# Patient Record
Sex: Male | Born: 1950 | Race: White | Hispanic: No | Marital: Married | Smoking: Never smoker
Health system: Southern US, Community
[De-identification: ages and names within clinical notes are randomized; demographics above are authoritative.]

## PROBLEM LIST (undated history)

## (undated) DIAGNOSIS — E119 Type 2 diabetes mellitus without complications: Secondary | ICD-10-CM

## (undated) DIAGNOSIS — Z1501 Genetic susceptibility to malignant neoplasm of breast: Secondary | ICD-10-CM

## (undated) DIAGNOSIS — Z1589 Genetic susceptibility to other disease: Secondary | ICD-10-CM

## (undated) DIAGNOSIS — Z801 Family history of malignant neoplasm of trachea, bronchus and lung: Secondary | ICD-10-CM

## (undated) DIAGNOSIS — H5789 Other specified disorders of eye and adnexa: Secondary | ICD-10-CM

## (undated) DIAGNOSIS — I10 Essential (primary) hypertension: Secondary | ICD-10-CM

## (undated) HISTORY — DX: Genetic susceptibility to malignant neoplasm of breast: Z15.01

## (undated) HISTORY — DX: Genetic susceptibility to other disease: Z15.89

## (undated) HISTORY — DX: Family history of malignant neoplasm of trachea, bronchus and lung: Z80.1

---

## 1975-08-08 HISTORY — PX: OTHER SURGICAL HISTORY: SHX169

## 2013-11-29 ENCOUNTER — Emergency Department (INDEPENDENT_AMBULATORY_CARE_PROVIDER_SITE_OTHER)
Admission: EM | Admit: 2013-11-29 | Discharge: 2013-11-29 | Disposition: A | Discharge status: Home / Self Care | Payer: BC Managed Care – PPO | Source: Home / Self Care | Attending: Family Medicine | Admitting: Family Medicine

## 2013-11-29 ENCOUNTER — Encounter (HOSPITAL_COMMUNITY): Payer: Self-pay | Admitting: Emergency Medicine

## 2013-11-29 DIAGNOSIS — W230XXA Caught, crushed, jammed, or pinched between moving objects, initial encounter: Secondary | ICD-10-CM

## 2013-11-29 DIAGNOSIS — S61209A Unspecified open wound of unspecified finger without damage to nail, initial encounter: Secondary | ICD-10-CM

## 2013-11-29 DIAGNOSIS — S61012A Laceration without foreign body of left thumb without damage to nail, initial encounter: Secondary | ICD-10-CM

## 2013-11-29 MED ORDER — TRAMADOL HCL 50 MG PO TABS: 50.0000 mg | ORAL_TABLET | Freq: Four times a day (QID) | ORAL | Status: DC | PRN

## 2013-11-29 MED ORDER — CEPHALEXIN 500 MG PO CAPS: 500.0000 mg | ORAL_CAPSULE | Freq: Four times a day (QID) | ORAL | Status: DC

## 2013-11-29 NOTE — ED Provider Notes (Signed)
Vincent [name] is a 63 y.o. male who presents to Urgent Care today for left thumb laceration. Patient was working on his car today about one hour prior to presentation when his left thumb became entrapped between 2 heavy objects. He told her some free. This caused a deep avulsion type laceration to the volar aspect of his thumb from the MCP to the PIP. He notes. Intact sensation of the tip of his thumb along with normal motion. He rates his pain is mild. He notes his last tetanus shot was one year ago. He denies any fevers or chills nausea vomiting or diarrhea.   History reviewed. No pertinent past medical history. History  Substance Use Topics  . Smoking status: Not on file  . Smokeless tobacco: Not on file  . Alcohol Use: Not on file   ROS as above Medications: No current facility-administered medications for this encounter.   Current Outpatient Prescriptions  Medication Sig Dispense Refill  . cephALEXin (KEFLEX) 500 MG capsule Take 1 capsule (500 mg total) by mouth 4 (four) times daily.  40 capsule  0  . traMADol (ULTRAM) 50 MG tablet Take 1 tablet (50 mg total) by mouth every 6 (six) hours as needed.  15 tablet  0    Exam:  BP 171/104  Pulse 65  Temp(Src) 97.9 F (36.6 C) (Oral)  Resp 18  SpO2 98% Gen: Well NAD Left hand: Deep of bulging type laceration from the MCP to the DIP along the volar aspect of the thumb. The avulsion extends from the web space to the radial side of the thumb at the mid distal phalanx. The laceration does not extend along the ulnar side of the thumb.  The laceration extends deep through the dermis and deep tissues. No tendinous structures are seen to be involved. Total length of the laceration was around 4 cm He has intact flexion extension abduction, adduction and opposition.  Sensation and capillary refill are intact distally.  Laceration repair: Consent obtained and timeout performed.  Thumb soaked in diluted Betadine solution for 20  minutes. 5 mL of lidocaine without epinephrine were injected into the laceration achieving good anesthesia. 1.5 L of normal saline was used to copiously irrigate the wound. The flayed skin edges were trimmed.  11 simple interrupted and horizontal mattress sutures were used to close the wound. 4-0 Ethilon was used.  Antibiotic ointment and a sterile bulky dressing was applied. A dorsal thumb spica splint using Orthoglass premade spinting material was placed.  Patient tolerated the procedure well   Assessment and Plan: 63 y.o. male with deep laceration of the left volar thumb. The tendon structure does not appear to be involved.  I discussed the case with on-call hand surgeon Dr. Lenon Curt.  He agreed with repair in the office and followup Monday. Tetanus is up-to-date.  Prophylactic Keflex antibiotic.  Tramadol for pain control.   Discussed warning signs or symptoms. Please see discharge instructions. Patient expresses understanding.    Gregor Hams, MD 11/29/13 2024

## 2013-11-29 NOTE — ED Notes (Signed)
C/o left hand laceration due to trying to work on a car States left hand between thumb and index finger was caught

## 2013-11-29 NOTE — Discharge Instructions (Signed)
Thank you for coming in today. Follow up with Dr. Lenon Curt Monday.  Return as needed.  Laceration Care, Adult A laceration is a cut or lesion that goes through all layers of the skin and into the tissue just beneath the skin. TREATMENT  Some lacerations may not require closure. Some lacerations may not be able to be closed due to an increased risk of infection. It is important to see your caregiver as soon as possible after an injury to minimize the risk of infection and maximize the opportunity for successful closure. If closure is appropriate, pain medicines may be given, if needed. The wound will be cleaned to help prevent infection. Your caregiver will use stitches (sutures), staples, wound glue (adhesive), or skin adhesive strips to repair the laceration. These tools bring the skin edges together to allow for faster healing and a better cosmetic outcome. However, all wounds will heal with a scar. Once the wound has healed, scarring can be minimized by covering the wound with sunscreen during the day for 1 full year. HOME CARE INSTRUCTIONS  For sutures or staples:  Keep the wound clean and dry.  If you were given a bandage (dressing), you should change it at least once a day. Also, change the dressing if it becomes wet or dirty, or as directed by your caregiver.  Wash the wound with soap and water 2 times a day. Rinse the wound off with water to remove all soap. Pat the wound dry with a clean towel.  After cleaning, apply a thin layer of the antibiotic ointment as recommended by your caregiver. This will help prevent infection and keep the dressing from sticking.  You may shower as usual after the first 24 hours. Do not soak the wound in water until the sutures are removed.  Only take over-the-counter or prescription medicines for pain, discomfort, or fever as directed by your caregiver.  Get your sutures or staples removed as directed by your caregiver. For skin adhesive strips:  Keep the  wound clean and dry.  Do not get the skin adhesive strips wet. You may bathe carefully, using caution to keep the wound dry.  If the wound gets wet, pat it dry with a clean towel.  Skin adhesive strips will fall off on their own. You may trim the strips as the wound heals. Do not remove skin adhesive strips that are still stuck to the wound. They will fall off in time. For wound adhesive:  You may briefly wet your wound in the shower or bath. Do not soak or scrub the wound. Do not swim. Avoid periods of heavy perspiration until the skin adhesive has fallen off on its own. After showering or bathing, gently pat the wound dry with a clean towel.  Do not apply liquid medicine, cream medicine, or ointment medicine to your wound while the skin adhesive is in place. This may loosen the film before your wound is healed.  If a dressing is placed over the wound, be careful not to apply tape directly over the skin adhesive. This may cause the adhesive to be pulled off before the wound is healed.  Avoid prolonged exposure to sunlight or tanning lamps while the skin adhesive is in place. Exposure to ultraviolet light in the first year will darken the scar.  The skin adhesive will usually remain in place for 5 to 10 days, then naturally fall off the skin. Do not pick at the adhesive film. You may need a tetanus shot if:  You cannot remember when you had your last tetanus shot.  You have never had a tetanus shot. If you get a tetanus shot, your arm may swell, get red, and feel warm to the touch. This is common and not a problem. If you need a tetanus shot and you choose not to have one, there is a rare chance of getting tetanus. Sickness from tetanus can be serious. SEEK MEDICAL CARE IF:   You have redness, swelling, or increasing pain in the wound.  You see a red line that goes away from the wound.  You have yellowish-white fluid (pus) coming from the wound.  You have a fever.  You notice a bad  smell coming from the wound or dressing.  Your wound breaks open before or after sutures have been removed.  You notice something coming out of the wound such as wood or glass.  Your wound is on your hand or foot and you cannot move a finger or toe. SEEK IMMEDIATE MEDICAL CARE IF:   Your pain is not controlled with prescribed medicine.  You have severe swelling around the wound causing pain and numbness or a change in color in your arm, hand, leg, or foot.  Your wound splits open and starts bleeding.  You have worsening numbness, weakness, or loss of function of any joint around or beyond the wound.  You develop painful lumps near the wound or on the skin anywhere on your body. MAKE SURE YOU:   Understand these instructions.  Will watch your condition.  Will get help right away if you are not doing well or get worse. Document Released: 07/24/2005 Document Revised: 10/16/2011 Document Reviewed: 01/17/2011 [facility] Patient Information 2014 [place], [place].  Deep Skin Avulsion A deep skin avulsion is when all layers of the skin or parts of body structures have been torn away. This is usually a result of severe injury (trauma). A deep skin avulsion can include damage to important structures beneath the skin such as tendons, ligaments, nerves, or blood vessels.  CAUSES  Many injuries can lead to a deep skin avulsion. These include:   Crush injuries.  Bites.  Falls against jagged surfaces.  Gunshot wounds.  Severe burns and injuries involving dragging (such as those from a bicycle or motorcycle accident). TREATMENT   If the wound is small and there is no damage to vital structures like nerves and blood vessels, the damaged tissues may be removed. Then, the wound can be cleaned thoroughly and closed.  A skin graft may be performed. This is a procedure in which the outer layer of skin is removed from a different part of your body. That skin (skin graft) is used to cover the open  wound. This can happen after damaged tissue is removed and repairs are completed.  Your caregiver may onlyapply a bandage (dressing) to the wound. The wound will be kept clean and allowed to heal. Healing can take weeks or months and usually leaves a large scar. This type of treatment is only done if your caregiver feels that skin grafting or a similar procedure would not work. You might need a tetanus shot if:  You cannot remember when you had your last tetanus shot.  You have never had a tetanus shot.  The injury broke your skin. If you got a tetanus shot, your arm may swell, get red, and feel warm to the touch. This is common and not a problem. If you need a tetanus shot and you choose not to  have one, there is a rare chance of getting tetanus. Sickness from tetanus can be serious. HOME CARE INSTRUCTIONS   Only take over-the-counter or prescription medicines for pain, discomfort, or fever as directed by your caregiver.  Gently wash the area with mild soap and water 2 times a day, or as directed. Rinse off the soap. Pat the area dry with a clean towel. Do not rub the wound. This may cause bleeding.  Follow your caregiver's instructions for how often you need to change the dressing.  Apply ointment and a dressing to the wound as directed.  If the dressing sticks, moisten it with soapy water and gently remove it.  Change the bandage right away if it becomes wet, dirty, or starts to smell bad.  Take showers. Do not take tub baths, swim, or do anything that may soak the wound until it is healed.  Use anti-itch medicine as directed by your caregiver. The wound may itch when it is healing. Do not pick or scratch at the wound.  Follow up with your caregiver for stitches (sutures), staple, or skin adhesive strip removal. SEEK MEDICAL CARE IF:   You have redness, swelling, or increasing pain in your wound.  A red streak or line extends away from the wound.  You have pus coming from the  wound.  You notice a bad smell coming from thewound or dressing.  The wound breaks open (edges not staying together) after sutures have been removed.  You notice something coming out of the wound, such as a small piece of wood, glass, or metal.  You are unable to properly move a finger or toe if the wound is on your hand or foot.  You have severe swelling around the wound that causes pain and numbness.  Your arm, hand, leg, or foot changes color. SEEK IMMEDIATE MEDICAL CARE IF:   Your pain becomes severe or is not adequately relieved with pain medicine.  You have a fever.  You have nausea and vomiting for more than 24 hours.  You feel lightheaded, weak, or faint.  You develop chest pain or difficulty breathing. MAKE SURE YOU:   Understand these instructions.  Will watch your condition.  Will get help right away if you are not doing well or get worse. Document Released: 09/19/2006 Document Revised: 10/16/2011 Document Reviewed: 11/27/2010 [facility] Patient Information 2014 [place], [place].

## 2017-02-26 ENCOUNTER — Other Ambulatory Visit: Payer: Self-pay | Admitting: Family Medicine

## 2017-02-26 ENCOUNTER — Ambulatory Visit
Admission: RE | Admit: 2017-02-26 | Discharge: 2017-02-26 | Disposition: A | Discharge status: Home / Self Care | Payer: BLUE CROSS/BLUE SHIELD | Source: Ambulatory Visit | Attending: Family Medicine | Admitting: Family Medicine

## 2017-02-26 DIAGNOSIS — M25362 Other instability, left knee: Secondary | ICD-10-CM

## 2017-03-13 ENCOUNTER — Encounter (INDEPENDENT_AMBULATORY_CARE_PROVIDER_SITE_OTHER): Payer: BLUE CROSS/BLUE SHIELD | Admitting: Ophthalmology

## 2017-03-13 DIAGNOSIS — I10 Essential (primary) hypertension: Secondary | ICD-10-CM

## 2017-03-13 DIAGNOSIS — H43813 Vitreous degeneration, bilateral: Secondary | ICD-10-CM

## 2017-03-13 DIAGNOSIS — H35033 Hypertensive retinopathy, bilateral: Secondary | ICD-10-CM | POA: Diagnosis not present

## 2017-03-13 DIAGNOSIS — H2513 Age-related nuclear cataract, bilateral: Secondary | ICD-10-CM

## 2017-03-13 DIAGNOSIS — H34811 Central retinal vein occlusion, right eye, with macular edema: Secondary | ICD-10-CM

## 2017-03-21 ENCOUNTER — Other Ambulatory Visit: Payer: Self-pay | Admitting: Physical Medicine and Rehabilitation

## 2017-03-21 DIAGNOSIS — M545 Low back pain: Secondary | ICD-10-CM

## 2017-04-10 ENCOUNTER — Encounter (INDEPENDENT_AMBULATORY_CARE_PROVIDER_SITE_OTHER): Payer: BLUE CROSS/BLUE SHIELD | Admitting: Ophthalmology

## 2017-04-10 DIAGNOSIS — H2513 Age-related nuclear cataract, bilateral: Secondary | ICD-10-CM

## 2017-04-10 DIAGNOSIS — H34811 Central retinal vein occlusion, right eye, with macular edema: Secondary | ICD-10-CM

## 2017-04-10 DIAGNOSIS — H43813 Vitreous degeneration, bilateral: Secondary | ICD-10-CM

## 2017-05-07 ENCOUNTER — Encounter (INDEPENDENT_AMBULATORY_CARE_PROVIDER_SITE_OTHER): Payer: BLUE CROSS/BLUE SHIELD | Admitting: Ophthalmology

## 2017-05-07 DIAGNOSIS — H43813 Vitreous degeneration, bilateral: Secondary | ICD-10-CM

## 2017-05-07 DIAGNOSIS — H34811 Central retinal vein occlusion, right eye, with macular edema: Secondary | ICD-10-CM

## 2017-05-07 DIAGNOSIS — H2513 Age-related nuclear cataract, bilateral: Secondary | ICD-10-CM

## 2017-06-06 ENCOUNTER — Encounter (INDEPENDENT_AMBULATORY_CARE_PROVIDER_SITE_OTHER): Payer: BLUE CROSS/BLUE SHIELD | Admitting: Ophthalmology

## 2017-06-06 DIAGNOSIS — H34811 Central retinal vein occlusion, right eye, with macular edema: Secondary | ICD-10-CM

## 2017-06-06 DIAGNOSIS — H43813 Vitreous degeneration, bilateral: Secondary | ICD-10-CM | POA: Diagnosis not present

## 2017-06-06 DIAGNOSIS — I10 Essential (primary) hypertension: Secondary | ICD-10-CM | POA: Diagnosis not present

## 2017-06-06 DIAGNOSIS — H35033 Hypertensive retinopathy, bilateral: Secondary | ICD-10-CM

## 2017-07-11 ENCOUNTER — Encounter (INDEPENDENT_AMBULATORY_CARE_PROVIDER_SITE_OTHER): Payer: BLUE CROSS/BLUE SHIELD | Admitting: Ophthalmology

## 2017-07-11 DIAGNOSIS — H35033 Hypertensive retinopathy, bilateral: Secondary | ICD-10-CM | POA: Diagnosis not present

## 2017-07-11 DIAGNOSIS — H2513 Age-related nuclear cataract, bilateral: Secondary | ICD-10-CM

## 2017-07-11 DIAGNOSIS — H34811 Central retinal vein occlusion, right eye, with macular edema: Secondary | ICD-10-CM | POA: Diagnosis not present

## 2017-07-11 DIAGNOSIS — I10 Essential (primary) hypertension: Secondary | ICD-10-CM | POA: Diagnosis not present

## 2017-07-11 DIAGNOSIS — H43813 Vitreous degeneration, bilateral: Secondary | ICD-10-CM

## 2017-08-22 ENCOUNTER — Encounter (INDEPENDENT_AMBULATORY_CARE_PROVIDER_SITE_OTHER): Payer: BLUE CROSS/BLUE SHIELD | Admitting: Ophthalmology

## 2017-08-22 DIAGNOSIS — H2513 Age-related nuclear cataract, bilateral: Secondary | ICD-10-CM | POA: Diagnosis not present

## 2017-08-22 DIAGNOSIS — H34811 Central retinal vein occlusion, right eye, with macular edema: Secondary | ICD-10-CM

## 2017-08-22 DIAGNOSIS — I10 Essential (primary) hypertension: Secondary | ICD-10-CM

## 2017-08-22 DIAGNOSIS — H43813 Vitreous degeneration, bilateral: Secondary | ICD-10-CM | POA: Diagnosis not present

## 2017-08-22 DIAGNOSIS — H35033 Hypertensive retinopathy, bilateral: Secondary | ICD-10-CM | POA: Diagnosis not present

## 2017-08-31 ENCOUNTER — Ambulatory Visit
Admission: RE | Admit: 2017-08-31 | Discharge: 2017-08-31 | Disposition: A | Discharge status: Home / Self Care | Payer: BLUE CROSS/BLUE SHIELD | Source: Ambulatory Visit | Attending: Family Medicine | Admitting: Family Medicine

## 2017-08-31 ENCOUNTER — Other Ambulatory Visit: Payer: Self-pay | Admitting: Family Medicine

## 2017-08-31 DIAGNOSIS — Z01811 Encounter for preprocedural respiratory examination: Secondary | ICD-10-CM

## 2017-09-28 ENCOUNTER — Ambulatory Visit: Payer: Self-pay | Admitting: Orthopedic Surgery

## 2017-09-28 NOTE — H&P (View-Only) (Signed)
Vincent [name] is an 67 y.o. male.   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 The patient is having mild pain; pain level 6/10; "left lower back and down my leg" that stops at the ankle He has numbness , left lower leg  Recent PT: home physical therapy; "I did 12 visits and did fine"  The patient has been using narcotic medication; is using a brace/support; He has had two injections by dr Nelva Bush "they did not help" Patient presents for a surgical opinion Thomes Dinning to a work related injury that occurred. He reported pain into his back and left leg when seen initially. Was treated and sent to Dr. Nelva Bush. Dr. 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 Patient has had persistent numbness over the anterior shin as well as weakness into the thigh and and has had the leg give out on him on occasion. He presents here for a evaluation with his wife.  Past Medical Hx HTN Hypercholesterolemia  No family history on file. Social History:  has no tobacco, alcohol, and drug history on file.  Smoking Status: Former smoker Non-smoker Chewing tobacco: none Alcohol intake: Occasional Hand Dominance: Right Work related injury?: Y Advance directive: N Medical Power of Attorney: N  Allergies: No Known Allergies  Medications Aspir-81 lisinopril 10 mg tablet lovastatin 40 mg tablet meloxicam 15 mg tablet metFORMIN 500 mg tablet Tylenol 325 mg capsule  Review of Systems  Constitutional: Negative.   HENT: Negative.   Eyes: Negative.   Respiratory: Negative.   Cardiovascular: Negative.   Gastrointestinal: Negative.   Genitourinary: Negative.   Musculoskeletal: Positive for back pain.  Skin: Negative.   Neurological: Positive for sensory change and focal weakness.  Psychiatric/Behavioral: Negative.     There were no vitals taken for this  visit. Physical Exam  Constitutional: He is oriented to person, place, and time. He appears well-developed.  HENT:  Head: Normocephalic.  Eyes: Pupils are equal, round, and reactive to light.  Neck: Normal range of motion.  Cardiovascular: Normal rate.  Respiratory: Effort normal.  GI: Soft.  Musculoskeletal:  Patient is a 67 year old male.  Constitutional: General Appearance: healthy-appearing and distress (mild).  Psychiatric: Mood and Affect: active and alert and normal affect.  Cardiovascular System: Edema Right: none; Dorsalis and posterior tibial pulses 2+. Edema Left: none.  Cervical Spine: Inspection: alignment normal and no muscle atrophy. Bony Palpation: no tenderness of the spinous process. Active Range of Motion: flexion normal and extension normal. Passive Range of Motion: flexion normal and extension normal.  Motor Strength: C5 on the Right: abduction deltoid 5/5. C5 on the Left: abduction deltoid 5/5. C6 on the Right: flexion biceps 5/5. C6 on the Left: flexion biceps 5/5. C7 on the Right: extension triceps 5/5 and flexion wrist 5/5. C7 on the Left: extension triceps 5/5 and flexion wrist 5/5. C8 on the Right: flexion fingers 5/5. C8 on the Left: flexion fingers 5/5. T1 on the Right: abduction fingers 5/5. T1 on the Left: abduction fingers 5/5.  Neurological System: Biceps Reflex Right: normal (2). Biceps Reflex Left: normal on the left (2). Brachioradialis Reflex Right: normal (2). Brachioradialis Reflex Left: normal (2). Triceps Reflex Right: normal (2). Triceps Reflex Left: normal (2). Sensation on the Right: C5 normal, C6 normal, C7 normal, C8 normal, and sensation of the distal extremities normal. Sensation on the Left: C5 normal, C6 normal, C7 normal,  and distal extremities normal. Special Tests on the Right: Spurling's test negative. Special Tests on the Left: Spurling's test negative. Special Tests: Hoffman Sign negative. No Babinski or Clonus. Knee Reflex Right: normal  (2). Knee Reflex Left: absent (0). Ankle Reflex Right: normal (2). Ankle Reflex Left: normal (2). Babinski Reflex Right: plantar reflex absent. Babinski Reflex Left: plantar reflex absent. Special Tests on the Right: seated straight leg raising test negative and no clonus of the ankle/knee. Special Tests on the Left: no clonus of the ankle/knee and seated straight leg raising test positive.  Skin: Head and Neck: normal. Right Upper Extremity: normal. Left Upper Extremity: normal. Inspection and palpation: no rash.  Gait and Station: Appearance: ambulating with no assistive devices and antalgic gait.  Abdomen: Inspection and Palpation: non-distended and no tenderness.  Lumbar Spine: Inspection: normal alignment. Bony Palpation of the Lumbar Spine: tender at lumbosacral junction.. Bony Palpation of the Right Hip: no tenderness of the greater trochanter and tenderness of the SI joint; Pelvis stable. Bony Palpation of the Left Hip: no tenderness of the greater trochanter. Soft Tissue Palpation on the Right: No flank pain with percussion. Active Range of Motion: limited flexion and extention.  Motor Strength: L1 Motor Strength on the Right: hip flexion iliopsoas 5/5. L1 Motor Strength on the Left: hip flexion iliopsoas 5/5. L2-L4 Motor Strength on the Right: knee extension quadriceps 5/5. L2-L4 Motor Strength on the Left: knee extension quadriceps 4/5. L5 Motor Strength on the Right: ankle dorsiflexion tibialis anterior 5/5 and great toe extension extensor hallucis longus 5/5. L5 Motor Strength on the Left: ankle dorsiflexion tibialis anterior 5/5 and great toe extension extensor hallucis longus 5/5. S1 Motor Strength on the Right: plantar flexion gastrocnemius 5/5. S1 Motor Strength on the Left: plantar flexion gastrocnemius 5/5.  Decreased sensation in the L4 dermatome on the left.  Neurological: He is alert and oriented to person, place, and time.  Skin: Skin is warm and dry.    3 view x-rays AP  lateral flexion-extension lumbar spine demonstrates moderately severe disc degeneration L5-S1 with neuroforaminal narrowing.  Mild spondylosis at L3-4 and L4-5.  MRI from 816 and 2018 demonstrates an extruded fragment L4-5 migrating cephalad compressing the 4 root.  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 He has failed conservative treatment to include rest activity modification injections without avail and remains with significant neurologic deficit in the L4 nerve root distribution. Specifically he has decreased sensation L4 dermatome. He has atrophy in the left quad. And absent knee reflex. His knee is giving way.  At this point time we discussed a microlumbar decompression to decompress the L4 nerve root. His neurologic situation may not improve given the duration of neural compression. However it at this point certainly he has not improved with conservative treatment  Discussed risks and benefits including bleeding infection damage to neurovascular to is no change in symptoms worsen symptoms DVT PE and anesthetic complications etc. and also residual prolonged recovery for his L4 nerve root. That specifically involves quadricep weakness. His quadriceps will need to be rehabilitated. For him to be able to climb ladders squat and lift objects without the knee giving way.  No history of a PE DVT. 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 Neldon Newport we spent conservative time discussing all these issues reviewing his treatment to date and his current condition.  Plan microlumbar decompression L4-5  left  Cecilie Kicks., PA-C for Dr. Tonita Cong 09/28/2017, 4:24 PM

## 2017-09-28 NOTE — H&P (Signed)
Vincent [name] is an 67 y.o. male.   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 The patient is having mild pain; pain level 6/10; "left lower back and down my leg" that stops at the ankle He has numbness , left lower leg  Recent PT: home physical therapy; "I did 12 visits and did fine"  The patient has been using narcotic medication; is using a brace/support; He has had two injections by dr Nelva Bush "they did not help" Patient presents for a surgical opinion Thomes Dinning to a work related injury that occurred. He reported pain into his back and left leg when seen initially. Was treated and sent to Dr. Nelva Bush. Dr. 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 Patient has had persistent numbness over the anterior shin as well as weakness into the thigh and and has had the leg give out on him on occasion. He presents here for a evaluation with his wife.  Past Medical Hx HTN Hypercholesterolemia  No family history on file. Social History:  has no tobacco, alcohol, and drug history on file.  Smoking Status: Former smoker Non-smoker Chewing tobacco: none Alcohol intake: Occasional Hand Dominance: Right Work related injury?: Y Advance directive: N Medical Power of Attorney: N  Allergies: No Known Allergies  Medications Aspir-81 lisinopril 10 mg tablet lovastatin 40 mg tablet meloxicam 15 mg tablet metFORMIN 500 mg tablet Tylenol 325 mg capsule  Review of Systems  Constitutional: Negative.   HENT: Negative.   Eyes: Negative.   Respiratory: Negative.   Cardiovascular: Negative.   Gastrointestinal: Negative.   Genitourinary: Negative.   Musculoskeletal: Positive for back pain.  Skin: Negative.   Neurological: Positive for sensory change and focal weakness.  Psychiatric/Behavioral: Negative.     There were no vitals taken for this  visit. Physical Exam  Constitutional: He is oriented to person, place, and time. He appears well-developed.  HENT:  Head: Normocephalic.  Eyes: Pupils are equal, round, and reactive to light.  Neck: Normal range of motion.  Cardiovascular: Normal rate.  Respiratory: Effort normal.  GI: Soft.  Musculoskeletal:  Patient is a 67 year old male.  Constitutional: General Appearance: healthy-appearing and distress (mild).  Psychiatric: Mood and Affect: active and alert and normal affect.  Cardiovascular System: Edema Right: none; Dorsalis and posterior tibial pulses 2+. Edema Left: none.  Cervical Spine: Inspection: alignment normal and no muscle atrophy. Bony Palpation: no tenderness of the spinous process. Active Range of Motion: flexion normal and extension normal. Passive Range of Motion: flexion normal and extension normal.  Motor Strength: C5 on the Right: abduction deltoid 5/5. C5 on the Left: abduction deltoid 5/5. C6 on the Right: flexion biceps 5/5. C6 on the Left: flexion biceps 5/5. C7 on the Right: extension triceps 5/5 and flexion wrist 5/5. C7 on the Left: extension triceps 5/5 and flexion wrist 5/5. C8 on the Right: flexion fingers 5/5. C8 on the Left: flexion fingers 5/5. T1 on the Right: abduction fingers 5/5. T1 on the Left: abduction fingers 5/5.  Neurological System: Biceps Reflex Right: normal (2). Biceps Reflex Left: normal on the left (2). Brachioradialis Reflex Right: normal (2). Brachioradialis Reflex Left: normal (2). Triceps Reflex Right: normal (2). Triceps Reflex Left: normal (2). Sensation on the Right: C5 normal, C6 normal, C7 normal, C8 normal, and sensation of the distal extremities normal. Sensation on the Left: C5 normal, C6 normal, C7 normal,  and distal extremities normal. Special Tests on the Right: Spurling's test negative. Special Tests on the Left: Spurling's test negative. Special Tests: Hoffman Sign negative. No Babinski or Clonus. Knee Reflex Right: normal  (2). Knee Reflex Left: absent (0). Ankle Reflex Right: normal (2). Ankle Reflex Left: normal (2). Babinski Reflex Right: plantar reflex absent. Babinski Reflex Left: plantar reflex absent. Special Tests on the Right: seated straight leg raising test negative and no clonus of the ankle/knee. Special Tests on the Left: no clonus of the ankle/knee and seated straight leg raising test positive.  Skin: Head and Neck: normal. Right Upper Extremity: normal. Left Upper Extremity: normal. Inspection and palpation: no rash.  Gait and Station: Appearance: ambulating with no assistive devices and antalgic gait.  Abdomen: Inspection and Palpation: non-distended and no tenderness.  Lumbar Spine: Inspection: normal alignment. Bony Palpation of the Lumbar Spine: tender at lumbosacral junction.. Bony Palpation of the Right Hip: no tenderness of the greater trochanter and tenderness of the SI joint; Pelvis stable. Bony Palpation of the Left Hip: no tenderness of the greater trochanter. Soft Tissue Palpation on the Right: No flank pain with percussion. Active Range of Motion: limited flexion and extention.  Motor Strength: L1 Motor Strength on the Right: hip flexion iliopsoas 5/5. L1 Motor Strength on the Left: hip flexion iliopsoas 5/5. L2-L4 Motor Strength on the Right: knee extension quadriceps 5/5. L2-L4 Motor Strength on the Left: knee extension quadriceps 4/5. L5 Motor Strength on the Right: ankle dorsiflexion tibialis anterior 5/5 and great toe extension extensor hallucis longus 5/5. L5 Motor Strength on the Left: ankle dorsiflexion tibialis anterior 5/5 and great toe extension extensor hallucis longus 5/5. S1 Motor Strength on the Right: plantar flexion gastrocnemius 5/5. S1 Motor Strength on the Left: plantar flexion gastrocnemius 5/5.  Decreased sensation in the L4 dermatome on the left.  Neurological: He is alert and oriented to person, place, and time.  Skin: Skin is warm and dry.    3 view x-rays AP  lateral flexion-extension lumbar spine demonstrates moderately severe disc degeneration L5-S1 with neuroforaminal narrowing.  Mild spondylosis at L3-4 and L4-5.  MRI from 816 and 2018 demonstrates an extruded fragment L4-5 migrating cephalad compressing the 4 root.  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 He has failed conservative treatment to include rest activity modification injections without avail and remains with significant neurologic deficit in the L4 nerve root distribution. Specifically he has decreased sensation L4 dermatome. He has atrophy in the left quad. And absent knee reflex. His knee is giving way.  At this point time we discussed a microlumbar decompression to decompress the L4 nerve root. His neurologic situation may not improve given the duration of neural compression. However it at this point certainly he has not improved with conservative treatment  Discussed risks and benefits including bleeding infection damage to neurovascular to is no change in symptoms worsen symptoms DVT PE and anesthetic complications etc. and also residual prolonged recovery for his L4 nerve root. That specifically involves quadricep weakness. His quadriceps will need to be rehabilitated. For him to be able to climb ladders squat and lift objects without the knee giving way.  No history of a PE DVT. 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 Neldon Newport we spent conservative time discussing all these issues reviewing his treatment to date and his current condition.  Plan microlumbar decompression L4-5  left  Cecilie Kicks., PA-C for Dr. Tonita Cong 09/28/2017, 4:24 PM

## 2017-10-08 ENCOUNTER — Encounter (HOSPITAL_COMMUNITY)
Admission: RE | Admit: 2017-10-08 | Discharge: 2017-10-08 | Disposition: A | Discharge status: Home / Self Care | Payer: No Typology Code available for payment source | Source: Ambulatory Visit | Attending: Orthopedic Surgery | Admitting: Orthopedic Surgery

## 2017-10-08 ENCOUNTER — Encounter (HOSPITAL_COMMUNITY)
Admission: RE | Admit: 2017-10-08 | Discharge: 2017-10-08 | Disposition: A | Discharge status: Home / Self Care | Payer: Worker's Compensation | Source: Ambulatory Visit | Attending: Specialist | Admitting: Specialist

## 2017-10-08 ENCOUNTER — Encounter (HOSPITAL_COMMUNITY): Payer: Self-pay

## 2017-10-08 ENCOUNTER — Other Ambulatory Visit: Payer: Self-pay

## 2017-10-08 DIAGNOSIS — Z01812 Encounter for preprocedural laboratory examination: Secondary | ICD-10-CM | POA: Diagnosis present

## 2017-10-08 DIAGNOSIS — M5126 Other intervertebral disc displacement, lumbar region: Secondary | ICD-10-CM | POA: Diagnosis present

## 2017-10-08 DIAGNOSIS — Z01818 Encounter for other preprocedural examination: Secondary | ICD-10-CM | POA: Diagnosis not present

## 2017-10-08 HISTORY — DX: Essential (primary) hypertension: I10

## 2017-10-08 HISTORY — DX: Type 2 diabetes mellitus without complications: E11.9

## 2017-10-08 LAB — CBC
HCT: 45.5 % (ref 39.0–52.0)
Hemoglobin: 15.7 g/dL (ref 13.0–17.0)
MCH: 33.1 pg (ref 26.0–34.0)
MCHC: 34.5 g/dL (ref 30.0–36.0)
MCV: 96 fL (ref 78.0–100.0)
PLATELETS: 163 10*3/uL (ref 150–400)
RBC: 4.74 MIL/uL (ref 4.22–5.81)
RDW: 12.5 % (ref 11.5–15.5)
WBC: 5.6 10*3/uL (ref 4.0–10.5)

## 2017-10-08 LAB — BASIC METABOLIC PANEL WITH GFR
Anion gap: 8 (ref 5–15)
BUN: 17 mg/dL (ref 6–20)
CO2: 25 mmol/L (ref 22–32)
Calcium: 9.3 mg/dL (ref 8.9–10.3)
Chloride: 107 mmol/L (ref 101–111)
Creatinine, Ser: 1.16 mg/dL (ref 0.61–1.24)
GFR calc Af Amer: >60 mL/min (ref >60)
GFR calc non Af Amer: >60 mL/min (ref >60)
Glucose, Bld: 119 mg/dL — ABNORMAL HIGH (ref 65–99)
Potassium: 4.5 mmol/L (ref 3.5–5.1)
Sodium: 140 mmol/L (ref 135–145)

## 2017-10-08 LAB — HEMOGLOBIN A1C
HEMOGLOBIN A1C: 6.3 % — AB (ref 4.8–5.6)
Mean Plasma Glucose: 134.11 mg/dL

## 2017-10-08 LAB — SURGICAL PCR SCREEN
MRSA, PCR: NEGATIVE
Staphylococcus aureus: NEGATIVE

## 2017-10-08 NOTE — Progress Notes (Signed)
EKG, CXR and last OV requested fron Dr. Claris Gower.  Pt. Denies any cardiac history or cardiac testing.  Pt. Denies he is diabetic but he is taking metformin,states that he is borderline diabetic.  Does not check blood sugars at home.

## 2017-10-08 NOTE — Progress Notes (Signed)
   10/08/17 1611  OBSTRUCTIVE SLEEP APNEA  Have you ever been diagnosed with sleep apnea through a sleep study? No  Do you snore loudly (loud enough to be heard through closed doors)?  0  Do you often feel tired, fatigued, or sleepy during the daytime (such as falling asleep during driving or talking to someone)? 0  Has anyone observed you stop breathing during your sleep? 0  Do you have, or are you being treated for high blood pressure? 1  BMI more than 35 kg/m2? 1  Age > 50 (1-yes) 1  Neck circumference greater than:Male 16 inches or larger, Male 17inches or larger? 1  Male Gender (Yes=1) 1  Obstructive Sleep Apnea Score 5

## 2017-10-08 NOTE — Pre-Procedure Instructions (Signed)
Dempsy Damiano  10/08/2017      [id number] [address], [place] [address] Phone: [phone] Fax: [phone]    Your procedure is scheduled on October 11, 2017.  Report to [facility] Admitting at 05:30 A.M.  Call this number if you have problems the morning of surgery:  [phone]   Remember:  Do not eat food or drink liquids after midnight.  Continue all other medications as directed by your physician except for following these medication instructions before surgery.   Take these medicines the morning of surgery with A SIP OF WATER :  Cephalexin (Keflex) Tramadol (Ultram if needed)  7 days prior to surgery STOP taking any Aspirin (unless otherwise instructed by your surgeon), Meloxicam, Mobic, Aleve, Naproxen, Ibuprofen, Motrin, Advil, Goody's, BC's, all herbal medications, fish oil, and all vitamins.     How to Manage Your Diabetes Before and After Surgery  Why is it important to control my blood sugar before and after surgery? . Improving blood sugar levels before and after surgery helps healing and can limit problems. . A way of improving blood sugar control is eating a healthy diet by: o  Eating less sugar and carbohydrates o  Increasing activity/exercise o  Talking with your doctor about reaching your blood sugar goals . High blood sugars (greater than 180 mg/dL) can raise your risk of infections and slow your recovery, so you will need to focus on controlling your diabetes during the weeks before surgery. . Make sure that the doctor who takes care of your diabetes knows about your planned surgery including the date and location.  How do I manage my blood sugar before surgery? . Check your blood sugar at least 4 times a day, starting 2 days before surgery, to make sure that the level is not too high or low. o Check your blood sugar the morning of your surgery when you wake up and  every 2 hours until you get to the Short Stay unit. . If your blood sugar is less than 70 mg/dL, you will need to treat for low blood sugar: o Do not take insulin. o Treat a low blood sugar (less than 70 mg/dL) with  cup of clear juice (cranberry or apple), 4 glucose tablets, OR glucose gel. Recheck blood sugar in 15 minutes after treatment (to make sure it is greater than 70 mg/dL). If your blood sugar is not greater than 70 mg/dL on recheck, call [phone] o  for further instructions. . Report your blood sugar to the short stay nurse when you get to Short Stay.  . If you are admitted to the hospital after surgery: o Your blood sugar will be checked by the staff and you will probably be given insulin after surgery (instead of oral diabetes medicines) to make sure you have good blood sugar levels. o The goal for blood sugar control after surgery is 80-180 mg/dL.         WHAT DO I DO ABOUT MY DIABETES MEDICATION?   Marland Kitchen Do not take oral diabetes medicines (pills) the morning of surgery. Do NOT take Metformin (Glucophage) the morning of surgery.     Do not wear jewelry.  Do not wear lotions, powders, or cologne, or deodorant.  Do not shave 48 hours prior to surgery.  Men may shave face and neck.  Do not bring valuables to the hospital.  [facility] is not responsible for any  belongings or valuables.  Contacts, dentures or bridgework may not be worn into surgery.  Leave your suitcase in the car.  After surgery it may be brought to your room.  For patients admitted to the hospital, discharge time will be determined by your treatment team.  Patients discharged the day of surgery will not be allowed to drive home.   Special instructions:   [facility]- Preparing For Surgery  Before surgery, you can play an important role. Because skin is not sterile, your skin needs to be as free of germs as possible. You can reduce the number of germs on your skin by washing with CHG  (chlorahexidine gluconate) Soap before surgery.  CHG is an antiseptic cleaner which kills germs and bonds with the skin to continue killing germs even after washing.  Please do not use if you have an allergy to CHG or antibacterial soaps. If your skin becomes reddened/irritated stop using the CHG.  Do not shave (including legs and underarms) for at least 48 hours prior to first CHG shower. It is OK to shave your face.  Please follow these instructions carefully.   1. Shower the NIGHT BEFORE SURGERY and the MORNING OF SURGERY with CHG.   2. If you chose to wash your hair, wash your hair first as usual with your normal shampoo.  3. After you shampoo, rinse your hair and body thoroughly to remove the shampoo.  4. Use CHG as you would any other liquid soap. You can apply CHG directly to the skin and wash gently with a scrungie or a clean washcloth.   5. Apply the CHG Soap to your body ONLY FROM THE NECK DOWN.  Do not use on open wounds or open sores. Avoid contact with your eyes, ears, mouth and genitals (private parts). Wash Face and genitals (private parts)  with your normal soap.  6. Wash thoroughly, paying special attention to the area where your surgery will be performed.  7. Thoroughly rinse your body with warm water from the neck down.  8. DO NOT shower/wash with your normal soap after using and rinsing off the CHG Soap.  9. Pat yourself dry with a CLEAN TOWEL.  10. Wear CLEAN PAJAMAS to bed the night before surgery, wear comfortable clothes the morning of surgery  11. Place CLEAN SHEETS on your bed the night of your first shower and DO NOT SLEEP WITH PETS.    Day of Surgery: Do not apply any deodorants/lotions. Please wear clean clothes to the hospital/surgery center.      Please read over the following fact sheets that you were given.

## 2017-10-10 ENCOUNTER — Encounter (HOSPITAL_COMMUNITY): Payer: Self-pay | Admitting: Anesthesiology

## 2017-10-10 ENCOUNTER — Encounter (INDEPENDENT_AMBULATORY_CARE_PROVIDER_SITE_OTHER): Payer: BLUE CROSS/BLUE SHIELD | Admitting: Ophthalmology

## 2017-10-10 DIAGNOSIS — H2513 Age-related nuclear cataract, bilateral: Secondary | ICD-10-CM

## 2017-10-10 DIAGNOSIS — I10 Essential (primary) hypertension: Secondary | ICD-10-CM

## 2017-10-10 DIAGNOSIS — H43813 Vitreous degeneration, bilateral: Secondary | ICD-10-CM | POA: Diagnosis not present

## 2017-10-10 DIAGNOSIS — H35033 Hypertensive retinopathy, bilateral: Secondary | ICD-10-CM

## 2017-10-10 DIAGNOSIS — H34811 Central retinal vein occlusion, right eye, with macular edema: Secondary | ICD-10-CM | POA: Diagnosis not present

## 2017-10-10 MED ORDER — LACTATED RINGERS IV SOLN
INTRAVENOUS | Status: DC
  Administered 2017-10-11 (×2): via INTRAVENOUS

## 2017-10-10 MED ORDER — ACETAMINOPHEN 10 MG/ML IV SOLN
1000.0000 mg | INTRAVENOUS | Status: AC
Start: 2017-10-11 — End: 2017-10-11
  Administered 2017-10-11: 1000 mg via INTRAVENOUS
  Filled 2017-10-10: qty 100

## 2017-10-10 MED ORDER — DEXTROSE 5 % IV SOLN
3.0000 g | INTRAVENOUS | Status: AC
  Administered 2017-10-11: 3 g via INTRAVENOUS
  Filled 2017-10-10: qty 3

## 2017-10-10 NOTE — Anesthesia Preprocedure Evaluation (Addendum)
Anesthesia Evaluation  Patient identified by MRN, date of birth, ID band Patient awake    Reviewed: Allergy & Precautions, NPO status , Patient's Chart, lab work & pertinent test results  Airway Mallampati: I       Dental no notable dental hx. (+) Teeth Intact, Dental Advisory Given   Pulmonary neg pulmonary ROS,    Pulmonary exam normal breath sounds clear to auscultation       Cardiovascular hypertension, Pt. on medications Normal cardiovascular exam Rhythm:Regular Rate:Normal     Neuro/Psych negative neurological ROS  negative psych ROS   GI/Hepatic negative GI ROS, Neg liver ROS,   Endo/Other  diabetes, Oral Hypoglycemic Agents  Renal/GU negative Renal ROS  negative genitourinary   Musculoskeletal negative musculoskeletal ROS (+)   Abdominal (+) + obese,   Peds  Hematology negative hematology ROS (+)   Anesthesia Other Findings   Reproductive/Obstetrics                           Anesthesia Physical Anesthesia Plan  ASA: II  Anesthesia Plan: General   Post-op Pain Management:    Induction: Intravenous  PONV Risk Score and Plan: 3 and Ondansetron, Dexamethasone and Midazolam  Airway Management Planned: Oral ETT  Additional Equipment:   Intra-op Plan:   Post-operative Plan: Extubation in OR  Informed Consent: I have reviewed the patients History and Physical, chart, labs and discussed the procedure including the risks, benefits and alternatives for the proposed anesthesia with the patient or authorized representative who has indicated his/her understanding and acceptance.     Plan Discussed with: CRNA and Surgeon  Anesthesia Plan Comments:        Anesthesia Quick Evaluation

## 2017-10-11 ENCOUNTER — Ambulatory Visit (HOSPITAL_COMMUNITY): Payer: Worker's Compensation | Admitting: Anesthesiology

## 2017-10-11 ENCOUNTER — Encounter (HOSPITAL_COMMUNITY)
Admission: RE | Disposition: A | Discharge status: Home / Self Care | Payer: Self-pay | Source: Ambulatory Visit | Attending: Specialist

## 2017-10-11 ENCOUNTER — Ambulatory Visit (HOSPITAL_COMMUNITY): Payer: Worker's Compensation

## 2017-10-11 ENCOUNTER — Other Ambulatory Visit: Payer: Self-pay

## 2017-10-11 ENCOUNTER — Encounter (HOSPITAL_COMMUNITY): Payer: Self-pay | Admitting: General Practice

## 2017-10-11 ENCOUNTER — Ambulatory Visit (HOSPITAL_COMMUNITY): Payer: Worker's Compensation | Admitting: Emergency Medicine

## 2017-10-11 ENCOUNTER — Observation Stay (HOSPITAL_COMMUNITY)
Admission: RE | Admit: 2017-10-11 | Discharge: 2017-10-12 | Disposition: A | Discharge status: Home / Self Care | Payer: Worker's Compensation | Source: Ambulatory Visit | Attending: Specialist | Admitting: Specialist

## 2017-10-11 DIAGNOSIS — Z79899 Other long term (current) drug therapy: Secondary | ICD-10-CM | POA: Insufficient documentation

## 2017-10-11 DIAGNOSIS — Z7982 Long term (current) use of aspirin: Secondary | ICD-10-CM | POA: Insufficient documentation

## 2017-10-11 DIAGNOSIS — Z87891 Personal history of nicotine dependence: Secondary | ICD-10-CM | POA: Diagnosis not present

## 2017-10-11 DIAGNOSIS — M48061 Spinal stenosis, lumbar region without neurogenic claudication: Principal | ICD-10-CM | POA: Insufficient documentation

## 2017-10-11 DIAGNOSIS — I10 Essential (primary) hypertension: Secondary | ICD-10-CM | POA: Insufficient documentation

## 2017-10-11 DIAGNOSIS — E7800 Pure hypercholesterolemia, unspecified: Secondary | ICD-10-CM | POA: Insufficient documentation

## 2017-10-11 DIAGNOSIS — M5126 Other intervertebral disc displacement, lumbar region: Secondary | ICD-10-CM

## 2017-10-11 DIAGNOSIS — E119 Type 2 diabetes mellitus without complications: Secondary | ICD-10-CM | POA: Insufficient documentation

## 2017-10-11 DIAGNOSIS — Z7984 Long term (current) use of oral hypoglycemic drugs: Secondary | ICD-10-CM | POA: Insufficient documentation

## 2017-10-11 DIAGNOSIS — Z419 Encounter for procedure for purposes other than remedying health state, unspecified: Secondary | ICD-10-CM

## 2017-10-11 HISTORY — DX: Other specified disorders of eye and adnexa: H57.89

## 2017-10-11 HISTORY — PX: LUMBAR LAMINECTOMY/DECOMPRESSION MICRODISCECTOMY: SHX5026

## 2017-10-11 LAB — GLUCOSE, CAPILLARY
GLUCOSE-CAPILLARY: 206 mg/dL — AB (ref 65–99)
GLUCOSE-CAPILLARY: 199 mg/dL — AB (ref 65–99)
Glucose-Capillary: 165 mg/dL — ABNORMAL HIGH (ref 65–99)
Glucose-Capillary: 173 mg/dL — ABNORMAL HIGH (ref 65–99)
Glucose-Capillary: 105 mg/dL — ABNORMAL HIGH (ref 65–99)

## 2017-10-11 SURGERY — LUMBAR LAMINECTOMY/DECOMPRESSION MICRODISCECTOMY 1 LEVEL
Anesthesia: General | Laterality: Left

## 2017-10-11 MED ORDER — MELOXICAM 15 MG PO TABS: 15.0000 mg | ORAL_TABLET | Freq: Every day | ORAL | Status: DC | PRN

## 2017-10-11 MED ORDER — POLYETHYLENE GLYCOL 3350 17 G PO PACK: 17.0000 g | PACK | Freq: Every day | ORAL | Status: DC | PRN

## 2017-10-11 MED ORDER — SUGAMMADEX SODIUM 500 MG/5ML IV SOLN
INTRAVENOUS | Status: DC | PRN
  Administered 2017-10-11: 300 mg via INTRAVENOUS

## 2017-10-11 MED ORDER — OXYCODONE HCL 5 MG PO TABS: 10.0000 mg | ORAL_TABLET | ORAL | Status: DC | PRN

## 2017-10-11 MED ORDER — HYDROMORPHONE HCL 1 MG/ML IJ SOLN: 0.2500 mg | INTRAMUSCULAR | Status: DC | PRN

## 2017-10-11 MED ORDER — BISACODYL 5 MG PO TBEC: 5.0000 mg | DELAYED_RELEASE_TABLET | Freq: Every day | ORAL | Status: DC | PRN

## 2017-10-11 MED ORDER — BUPIVACAINE-EPINEPHRINE (PF) 0.5% -1:200000 IJ SOLN
INTRAMUSCULAR | Status: AC
  Filled 2017-10-11: qty 30

## 2017-10-11 MED ORDER — MIDAZOLAM HCL 5 MG/5ML IJ SOLN
INTRAMUSCULAR | Status: DC | PRN
  Administered 2017-10-11: 2 mg via INTRAVENOUS

## 2017-10-11 MED ORDER — PROMETHAZINE HCL 25 MG/ML IJ SOLN: 6.2500 mg | INTRAMUSCULAR | Status: DC | PRN

## 2017-10-11 MED ORDER — DOCUSATE SODIUM 100 MG PO CAPS
100.0000 mg | ORAL_CAPSULE | Freq: Two times a day (BID) | ORAL | Status: DC
  Administered 2017-10-11: 100 mg via ORAL
  Filled 2017-10-11: qty 1

## 2017-10-11 MED ORDER — SODIUM CHLORIDE 0.9 % IR SOLN
Status: DC | PRN
  Administered 2017-10-11: 08:00:00

## 2017-10-11 MED ORDER — ONDANSETRON HCL 4 MG PO TABS: 4.0000 mg | ORAL_TABLET | Freq: Four times a day (QID) | ORAL | Status: DC | PRN

## 2017-10-11 MED ORDER — DEXAMETHASONE SODIUM PHOSPHATE 10 MG/ML IJ SOLN
INTRAMUSCULAR | Status: DC | PRN
  Administered 2017-10-11: 5 mg via INTRAVENOUS

## 2017-10-11 MED ORDER — RISAQUAD PO CAPS
1.0000 | ORAL_CAPSULE | Freq: Every day | ORAL | Status: DC
  Administered 2017-10-11: 1 via ORAL
  Filled 2017-10-11 (×2): qty 1

## 2017-10-11 MED ORDER — ACETAMINOPHEN 650 MG RE SUPP: 650.0000 mg | RECTAL | Status: DC | PRN

## 2017-10-11 MED ORDER — DEXTROSE 5 % IV SOLN
3.0000 g | Freq: Three times a day (TID) | INTRAVENOUS | Status: DC
  Filled 2017-10-11 (×2): qty 3000

## 2017-10-11 MED ORDER — ROCURONIUM BROMIDE 100 MG/10ML IV SOLN
INTRAVENOUS | Status: DC | PRN
  Administered 2017-10-11: 10 mg via INTRAVENOUS
  Administered 2017-10-11: 50 mg via INTRAVENOUS
  Administered 2017-10-11: 20 mg via INTRAVENOUS

## 2017-10-11 MED ORDER — PHENYLEPHRINE HCL 10 MG/ML IJ SOLN
INTRAVENOUS | Status: DC | PRN
  Administered 2017-10-11: 25 ug/min via INTRAVENOUS

## 2017-10-11 MED ORDER — FENTANYL CITRATE (PF) 100 MCG/2ML IJ SOLN
INTRAMUSCULAR | Status: DC | PRN
  Administered 2017-10-11: 50 ug via INTRAVENOUS
  Administered 2017-10-11: 150 ug via INTRAVENOUS
  Administered 2017-10-11: 50 ug via INTRAVENOUS

## 2017-10-11 MED ORDER — CEFAZOLIN SODIUM-DEXTROSE 2-4 GM/100ML-% IV SOLN
2.0000 g | Freq: Three times a day (TID) | INTRAVENOUS | Status: DC
  Administered 2017-10-11 – 2017-10-12 (×3): 2 g via INTRAVENOUS
  Filled 2017-10-11 (×3): qty 100

## 2017-10-11 MED ORDER — OXYCODONE HCL 5 MG PO TABS
5.0000 mg | ORAL_TABLET | ORAL | Status: DC | PRN
  Administered 2017-10-11: 5 mg via ORAL
  Filled 2017-10-11: qty 1

## 2017-10-11 MED ORDER — ONDANSETRON HCL 4 MG/2ML IJ SOLN
INTRAMUSCULAR | Status: DC | PRN
  Administered 2017-10-11: 4 mg via INTRAVENOUS

## 2017-10-11 MED ORDER — FENTANYL CITRATE (PF) 250 MCG/5ML IJ SOLN
INTRAMUSCULAR | Status: AC
  Filled 2017-10-11: qty 5

## 2017-10-11 MED ORDER — MIDAZOLAM HCL 2 MG/2ML IJ SOLN
INTRAMUSCULAR | Status: AC
  Filled 2017-10-11: qty 2

## 2017-10-11 MED ORDER — DOCUSATE SODIUM 100 MG PO CAPS: 100.0000 mg | ORAL_CAPSULE | Freq: Two times a day (BID) | ORAL | 2 refills | Status: DC

## 2017-10-11 MED ORDER — 0.9 % SODIUM CHLORIDE (POUR BTL) OPTIME
TOPICAL | Status: DC | PRN
  Administered 2017-10-11: 1000 mL

## 2017-10-11 MED ORDER — METHOCARBAMOL 500 MG PO TABS
500.0000 mg | ORAL_TABLET | Freq: Four times a day (QID) | ORAL | Status: DC | PRN
  Administered 2017-10-11 – 2017-10-12 (×2): 500 mg via ORAL
  Filled 2017-10-11 (×2): qty 1

## 2017-10-11 MED ORDER — LIDOCAINE HCL (CARDIAC) 20 MG/ML IV SOLN
INTRAVENOUS | Status: DC | PRN
  Administered 2017-10-11: 100 mg via INTRAVENOUS

## 2017-10-11 MED ORDER — MEPERIDINE HCL 50 MG/ML IJ SOLN: 6.2500 mg | INTRAMUSCULAR | Status: DC | PRN

## 2017-10-11 MED ORDER — PHENYLEPHRINE 40 MCG/ML (10ML) SYRINGE FOR IV PUSH (FOR BLOOD PRESSURE SUPPORT)
PREFILLED_SYRINGE | INTRAVENOUS | Status: AC
  Filled 2017-10-11: qty 10

## 2017-10-11 MED ORDER — OXYCODONE HCL 5 MG PO TABS: 5.0000 mg | ORAL_TABLET | Freq: Four times a day (QID) | ORAL | 0 refills | Status: DC | PRN

## 2017-10-11 MED ORDER — ALUM & MAG HYDROXIDE-SIMETH 200-200-20 MG/5ML PO SUSP
30.0000 mL | Freq: Four times a day (QID) | ORAL | Status: DC | PRN
  Filled 2017-10-11: qty 30

## 2017-10-11 MED ORDER — SURGIFOAM 100 EX MISC
CUTANEOUS | Status: DC | PRN
  Administered 2017-10-11: 08:00:00 via TOPICAL

## 2017-10-11 MED ORDER — METHOCARBAMOL 1000 MG/10ML IJ SOLN
500.0000 mg | Freq: Four times a day (QID) | INTRAVENOUS | Status: DC | PRN
  Filled 2017-10-11: qty 5

## 2017-10-11 MED ORDER — POTASSIUM CHLORIDE IN NACL 20-0.9 MEQ/L-% IV SOLN: INTRAVENOUS | Status: DC

## 2017-10-11 MED ORDER — ONDANSETRON HCL 4 MG/2ML IJ SOLN: 4.0000 mg | Freq: Four times a day (QID) | INTRAMUSCULAR | Status: DC | PRN

## 2017-10-11 MED ORDER — ASPIRIN EC 81 MG PO TBEC: 81.0000 mg | DELAYED_RELEASE_TABLET | Freq: Every day | ORAL | Status: AC

## 2017-10-11 MED ORDER — ACETAMINOPHEN 325 MG PO TABS
650.0000 mg | ORAL_TABLET | ORAL | Status: DC | PRN
  Administered 2017-10-12: 650 mg via ORAL
  Filled 2017-10-11: qty 2

## 2017-10-11 MED ORDER — MENTHOL 3 MG MT LOZG: 1.0000 | LOZENGE | OROMUCOSAL | Status: DC | PRN

## 2017-10-11 MED ORDER — GELATIN ABSORBABLE MT POWD
OROMUCOSAL | Status: DC | PRN
  Administered 2017-10-11: 08:00:00 via TOPICAL

## 2017-10-11 MED ORDER — THROMBIN 5000 UNITS EX SOLR
CUTANEOUS | Status: AC
  Filled 2017-10-11: qty 5000

## 2017-10-11 MED ORDER — POLYETHYLENE GLYCOL 3350 17 G PO PACK: 17.0000 g | PACK | Freq: Every day | ORAL | 0 refills | Status: DC

## 2017-10-11 MED ORDER — THROMBIN 20000 UNITS EX SOLR
CUTANEOUS | Status: AC
  Filled 2017-10-11: qty 20000

## 2017-10-11 MED ORDER — KETOROLAC TROMETHAMINE 30 MG/ML IJ SOLN: 30.0000 mg | Freq: Once | INTRAMUSCULAR | Status: DC | PRN

## 2017-10-11 MED ORDER — PHENOL 1.4 % MT LIQD: 1.0000 | OROMUCOSAL | Status: DC | PRN

## 2017-10-11 MED ORDER — PROPOFOL 10 MG/ML IV BOLUS
INTRAVENOUS | Status: AC
  Filled 2017-10-11: qty 20

## 2017-10-11 MED ORDER — METHOCARBAMOL 500 MG PO TABS: 500.0000 mg | ORAL_TABLET | Freq: Four times a day (QID) | ORAL | 1 refills | Status: DC | PRN

## 2017-10-11 MED ORDER — BUPIVACAINE-EPINEPHRINE 0.5% -1:200000 IJ SOLN
INTRAMUSCULAR | Status: DC | PRN
  Administered 2017-10-11: 14 mL

## 2017-10-11 MED ORDER — MAGNESIUM CITRATE PO SOLN: 1.0000 | Freq: Once | ORAL | Status: DC | PRN

## 2017-10-11 MED ORDER — LISINOPRIL 10 MG PO TABS: 10.0000 mg | ORAL_TABLET | Freq: Every day | ORAL | Status: DC

## 2017-10-11 MED ORDER — INSULIN ASPART 100 UNIT/ML ~~LOC~~ SOLN
0.0000 [IU] | Freq: Three times a day (TID) | SUBCUTANEOUS | Status: DC
  Administered 2017-10-11: 5 [IU] via SUBCUTANEOUS
  Administered 2017-10-11: 3 [IU] via SUBCUTANEOUS

## 2017-10-11 MED ORDER — EPHEDRINE SULFATE 50 MG/ML IJ SOLN
INTRAMUSCULAR | Status: DC | PRN
  Administered 2017-10-11 (×2): 10 mg via INTRAVENOUS

## 2017-10-11 MED ORDER — PROPOFOL 10 MG/ML IV BOLUS
INTRAVENOUS | Status: DC | PRN
  Administered 2017-10-11: 200 mg via INTRAVENOUS

## 2017-10-11 SURGICAL SUPPLY — 48 items
CLOSURE WOUND 1/2 X4 (GAUZE/BANDAGES/DRESSINGS) ×1
CLOTH 2% CHLOROHEXIDINE 3PK (PERSONAL CARE ITEMS) ×3 IMPLANT
COVER SURGICAL LIGHT HANDLE (MISCELLANEOUS) IMPLANT
DRAPE MICROSCOPE LEICA (MISCELLANEOUS) ×3 IMPLANT
DRAPE POUCH INSTRU U-SHP 10X18 (DRAPES) ×3 IMPLANT
DRAPE SHEET LG 3/4 BI-LAMINATE (DRAPES) ×3 IMPLANT
DRAPE SURG 17X11 SM STRL (DRAPES) ×3 IMPLANT
DRAPE UNIVERSAL PACK (DRAPES) ×3 IMPLANT
DRAPE UTILITY XL STRL (DRAPES) ×3 IMPLANT
DRSG AQUACEL AG ADV 3.5X 4 (GAUZE/BANDAGES/DRESSINGS) ×3 IMPLANT
DRSG AQUACEL AG ADV 3.5X 6 (GAUZE/BANDAGES/DRESSINGS) IMPLANT
DRSG TELFA 3X8 NADH (GAUZE/BANDAGES/DRESSINGS) IMPLANT
DURAPREP 26ML APPLICATOR (WOUND CARE) ×3 IMPLANT
DURASEAL SPINE SEALANT 3ML (MISCELLANEOUS) IMPLANT
ELECT BLADE 4.0 EZ CLEAN MEGAD (MISCELLANEOUS) ×3
ELECTRODE BLDE 4.0 EZ CLN MEGD (MISCELLANEOUS) ×1 IMPLANT
GLOVE BIOGEL PI IND STRL 7.0 (GLOVE) ×1 IMPLANT
GLOVE BIOGEL PI IND STRL 7.5 (GLOVE) ×1 IMPLANT
GLOVE BIOGEL PI INDICATOR 7.0 (GLOVE) ×2
GLOVE BIOGEL PI INDICATOR 7.5 (GLOVE) ×2
GLOVE SURG SS PI 7.0 STRL IVOR (GLOVE) ×9 IMPLANT
GLOVE SURG SS PI 7.5 STRL IVOR (GLOVE) ×3 IMPLANT
GLOVE SURG SS PI 8.0 STRL IVOR (GLOVE) ×3 IMPLANT
GOWN STRL REUS W/TWL XL LVL3 (GOWN DISPOSABLE) ×3 IMPLANT
HEMOSTAT SPONGE AVITENE ULTRA (HEMOSTASIS) IMPLANT
IV CATH 14GX2 1/4 (CATHETERS) ×3 IMPLANT
KIT BASIN OR (CUSTOM PROCEDURE TRAY) ×3 IMPLANT
NEEDLE SPNL 18GX3.5 QUINCKE PK (NEEDLE) ×9 IMPLANT
PACK LAMINECTOMY NEURO (CUSTOM PROCEDURE TRAY) ×3 IMPLANT
PATTIES SURGICAL .5 X.5 (GAUZE/BANDAGES/DRESSINGS) ×6 IMPLANT
PATTIES SURGICAL .75X.75 (GAUZE/BANDAGES/DRESSINGS) IMPLANT
PATTIES SURGICAL 1X1 (DISPOSABLE) ×3 IMPLANT
RUBBERBAND STERILE (MISCELLANEOUS) ×6 IMPLANT
SPONGE SURGIFOAM ABS GEL 100 (HEMOSTASIS) ×3 IMPLANT
STAPLER VISISTAT (STAPLE) IMPLANT
STRIP CLOSURE SKIN 1/2X4 (GAUZE/BANDAGES/DRESSINGS) ×2 IMPLANT
SUT NURALON 4 0 TR CR/8 (SUTURE) IMPLANT
SUT PROLENE 3 0 PS 2 (SUTURE) ×3 IMPLANT
SUT VIC AB 1 CT1 27 (SUTURE)
SUT VIC AB 1 CT1 27XBRD ANTBC (SUTURE) IMPLANT
SUT VIC AB 1-0 CT2 27 (SUTURE) ×3 IMPLANT
SUT VIC AB 2-0 CT1 27 (SUTURE)
SUT VIC AB 2-0 CT1 TAPERPNT 27 (SUTURE) IMPLANT
SUT VIC AB 2-0 CT2 27 (SUTURE) ×6 IMPLANT
SYR 3ML LL SCALE MARK (SYRINGE) ×3 IMPLANT
TOWEL OR 17X26 10 PK STRL BLUE (TOWEL DISPOSABLE) ×3 IMPLANT
TOWEL OR NON WOVEN STRL DISP B (DISPOSABLE) IMPLANT
YANKAUER SUCT BULB TIP NO VENT (SUCTIONS) ×3 IMPLANT

## 2017-10-11 NOTE — Progress Notes (Signed)
Dr. Jillyn Hidden made aware of patient's blood pressure.

## 2017-10-11 NOTE — Op Note (Signed)
NAMEMICHAIAH, HOLSOPPLE NO.:  [id number]  MEDICAL RECORD NO.:  [id number]  LOCATION:                                 FACILITY:  PHYSICIAN:  Susa Day, M.D.    DATE OF BIRTH:  [birth year]  DATE OF PROCEDURE:  10/11/2017 DATE OF DISCHARGE:                              OPERATIVE REPORT   PREOPERATIVE DIAGNOSES: 1. Spinal stenosis, herniated nucleus pulposus, L4-5, left. 2. Elevated BMI of 35.  POSTOPERATIVE DIAGNOSES: 1. Spinal stenosis, herniated nucleus pulposus, L4-5, left. 2. Elevated BMI of 35.  PROCEDURE PERFORMED: 1. Microlumbar decompression, L4-5, left. 2. Foraminotomy, L4-L5, left. 3. Microdiskectomy, L4-5, left. Technical difficulty increased due to the patient's elevated BMI.  HISTORY:  This is a 67 year old gentleman, who has history of a work related injury.  He had a disk herniation L4-5, migrating cephalad, facing his L4 root.  He had been refractory conservative treatment including rest, activity modification, therapy, and injections.  When seen in surgical consultation, he is near 5 months status post.  He had absent knee reflex, decreased sensation at L4 dermatome.  Weakness in the quadriceps muscle.  The patient was indicated for microlumbar decompression at L4-5 in attempt to decompress the L4 nerve root and L5 nerve root.  Risks and benefits discussed including bleeding, infection, damage to neurovascular structures, no change in symptoms, worsening symptoms, DVT, PE, anesthetic complications, no changes in his neurologic status, inability to decompress the nerve root, given the duration, etc.  TECHNIQUE:  With the patient in supine position after induction of adequate anesthesia 3 g Kefzol, placed prone on the Wilson frame.  All bony prominences were well padded.  Lumbar region was prepped and draped in usual sterile fashion.  Two 18-gauge spinal needles were utilized to localize L4-5 interspace, confirmed with x-ray.  Incision was  made from above the spinous process to L4 to below L5.  Subcutaneous tissue was dissected.  Electrocautery was utilized to achieve hemostasis.  Marcaine 0.25% with epinephrine was infiltrated in the perimuscular tissue.  The dorsolumbar fascia was divided in line in skin incision.  Paraspinous muscle elevated from lamina 4 and 5.  Large McCulloch retractor was placed.  Operating microscope was draped and brought on the surgical field with confirmatory radiograph obtained.  Hemilaminotomy of the caudad edge of L4 was performed with Leksell rongeur, followed by a 2 and a 3 mm Kerrison continuing cephalad to detaching the ligamentum flavum.  Preserving the pars.  Straight curette utilized to detach the ligamentum flavum from the cephalad edge of 5.  Foraminotomy L5 was performed.  Ligamentum flavum removed from the interspace with the neural patties protecting the thecal sac and neural elements at all times.  The patient had an extensive epidural venous plexus and encountered secondary bleeding to that.  Use multiple modalities for coagulation including bipolar cautery and thrombin-soaked Gelfoam.  The disk space at L4-5 was identified and confirmed with an x-ray and cephalad to that noted where the disk herniation had migrated to, we continued up cephalad.  Protected the neural elements with a Youth worker.  I performed a foraminotomy of L4.  Here, hemilaminectomy was performed.  Just cephalad to the disk  space, there was underneath an extensive epidural venous plexus.  The large epidural veins had mobilized those in an effort to gain access to the disk herniation without undue bleeding.  Beneath the epidural venous plexus with a Woodson retractor, annulotomy was performed and 2 small fragments were evacuated beneath that.  Above the root of L4, we used a Woodson probe at the foramen of L4 was then widely patent.  We had decompressed lateral recess to the medial border of the pedicle.   Lateral recess stenosis was noted as well.  Following this, I felt it was well decompressed from its dorsal surface. Fragments appeared smaller than that seen on the initial MRI, although it was over 6 months prior and I felt that there was some diminishing occurred given the duration of time.  Again, we meticulously probed and we had a Woodson retractor placed freely up the foramen of L4 and L5 were decompressed above the pedicle of L4 that which was concurrent with that noted on the MRI.  We then used bipolar cautery to achieve hemostasis.  I then packed with thrombin- soaked Gelfoam for 3-5 minutes, then removed that.  Good restoration of thecal sac.  No active bleeding was noted.  This was decompressed appropriately.  Placed thrombin-soaked Gelfoam in laminotomy defect.  We removed the [facility] retractor, irrigated the paraspinous musculature. We had to use the extra long D'Errico as well as the bipolar. Dorsolumbar fascia, after irrigated, was closed with #1 Vicryl, subcu with multiple layers of 2-0 and skin with staples.  Wound was dressed sterilely.  Placed supine on the hospital bed, extubated without difficulty and transported to the recovery room in satisfactory condition.  The patient tolerated the procedure well.  No complications.  Again, an extensive epidural venous plexus noted.  I felt decompressed L4 and L5 root appropriately without compromise in the large epidural veins.  He was extubated and transported to the recovery room in satisfactory condition.  ASSISTANT:  Lacie Draft, PA.  The patient tolerated the procedure well.     Susa Day, M.D.     Geralynn Rile  D:  10/11/2017  T:  10/11/2017  Job:  [id number]

## 2017-10-11 NOTE — Anesthesia Procedure Notes (Signed)
Procedure Name: Intubation Date/Time: 10/11/2017 7:34 AM Performed by: Lavell Luster, CRNA Pre-anesthesia Checklist: Patient identified, Suction available, Emergency Drugs available, Patient being monitored and Timeout performed Patient Re-evaluated:Patient Re-evaluated prior to induction Oxygen Delivery Method: Circle system utilized Preoxygenation: Pre-oxygenation with 100% oxygen Induction Type: IV induction Ventilation: Oral airway inserted - appropriate to patient size and Two handed mask ventilation required Laryngoscope Size: Glidescope, Mac and 4 Grade View: Grade III Tube type: Oral Tube size: 7.5 mm Number of attempts: 2 Airway Equipment and Method: Stylet and Video-laryngoscopy Placement Confirmation: ETT inserted through vocal cords under direct vision,  positive ETCO2 and breath sounds checked- equal and bilateral Secured at: 22 cm Tube secured with: Tape Dental Injury: Injury to lip  Difficulty Due To: Difficulty was anticipated, Difficult Airway- due to large tongue and Difficult Airway- due to limited oral opening Comments: DL with MAC 4 blade with Grade III view, DL with Glidescope MAC 4 with good view of cords, ETT passed easily.  Dr Jillyn Hidden verified placement.  Henderson Cloud, CRNA

## 2017-10-11 NOTE — Discharge Instructions (Signed)

## 2017-10-11 NOTE — Discharge Summary (Signed)
Physician Discharge Summary   Patient ID: Vincent [name] MRN: [id number] DOB/AGE: July 03, 1951 67 y.o.  Admit date: 10/11/2017 Discharge date: 10/12/2017  Primary Diagnosis:   [place] FOUR-FIVE  Admission Diagnoses:  Past Medical History:  Diagnosis Date  . Diabetes mellitus without complication ([place])    pt. states that he is not diabeticalthought he takes metformin  . Eye hemorrhage    right  . Hypertension    Discharge Diagnoses:   Principal Problem:   HNP (herniated nucleus pulposus), lumbar Active Problems:   Spinal stenosis at L4-L5 level  Procedure:  Procedure(s) (LRB): Microlumbar decompression Lumbar Four-Five Left (Left)   Consults: None  HPI:  see H&P    Laboratory Data: Hospital Outpatient Visit on 10/08/2017  Component Date Value Ref Range Status  . MRSA, PCR 10/08/2017 NEGATIVE  NEGATIVE Final  . Staphylococcus aureus 10/08/2017 NEGATIVE  NEGATIVE Final   Comment: (NOTE) The Xpert SA Assay (FDA approved for NASAL specimens in patients 30 years of age and older), is one component of a comprehensive surveillance program. It is not intended to diagnose infection nor to guide or monitor treatment. Performed at [facility], [place] [address], [place], [place] [address]   . Sodium 10/08/2017 140  135 - 145 mmol/L Final  . Potassium 10/08/2017 4.5  3.5 - 5.1 mmol/L Final  . Chloride 10/08/2017 107  101 - 111 mmol/L Final  . CO2 10/08/2017 25  22 - 32 mmol/L Final  . Glucose, Bld 10/08/2017 119* 65 - 99 mg/dL Final  . BUN 10/08/2017 17  6 - 20 mg/dL Final  . Creatinine, Ser 10/08/2017 1.16  0.61 - 1.24 mg/dL Final  . Calcium 10/08/2017 9.3  8.9 - 10.3 mg/dL Final  . GFR calc non Af Amer 10/08/2017 >60  >60 mL/min Final  . GFR calc Af Amer 10/08/2017 >60  >60 mL/min Final   Comment: (NOTE) The eGFR has been calculated using the CKD EPI equation. This calculation has not been validated in all clinical situations. eGFR's  persistently <60 mL/min signify possible Chronic Kidney Disease.   Georgiann Hahn gap 10/08/2017 8  5 - 15 Final   Performed at [facility], [place] [address] [place], [place] [address]  . WBC 10/08/2017 5.6  4.0 - 10.5 K/uL Final  . RBC 10/08/2017 4.74  4.22 - 5.81 MIL/uL Final  . Hemoglobin 10/08/2017 15.7  13.0 - 17.0 g/dL Final  . HCT 10/08/2017 45.5  39.0 - 52.0 % Final  . MCV 10/08/2017 96.0  78.0 - 100.0 fL Final  . MCH 10/08/2017 33.1  26.0 - 34.0 pg Final  . MCHC 10/08/2017 34.5  30.0 - 36.0 g/dL Final  . RDW 10/08/2017 12.5  11.5 - 15.5 % Final  . Platelets 10/08/2017 163  150 - 400 K/uL Final   Performed at [facility], [place] [address], [place], [place] [address]  . Hgb A1c MFr Bld 10/08/2017 6.3* 4.8 - 5.6 % Final   Comment: (NOTE) Pre diabetes:          5.7%-6.4% Diabetes:              >6.4% Glycemic control for   <7.0% adults with diabetes   . Mean Plasma Glucose 10/08/2017 134.11  mg/dL Final   Performed at [facility] [address] [place], [place] [address]   No results for input(s): HGB in the last 72 hours. No results for input(s): WBC, RBC, HCT, PLT in the  last 72 hours. No results for input(s): NA, K, CL, CO2, BUN, CREATININE, GLUCOSE, CALCIUM in the last 72 hours. No results for input(s): LABPT, INR in the last 72 hours.  X-Rays:Dg Lumbar Spine 2-3 Views  Result Date: 10/11/2017 CLINICAL DATA:  Localization film for L4-5 surgery EXAM: LUMBAR SPINE - 2-3 VIEW COMPARISON:  Lumbar spine films of 10/08/2017 FINDINGS: Localization film labeled 1 shows a needle positioned posteriorly directed toward the inferior spinous process of L3-4 and a second more caudally directed toward the inferior spinous process of L4-5. The second film shows instruments directed toward the L4-5 interspace with the more caudal instrument directed toward the posterior midbody of L5. The third film shows instruments directed toward L4-5 space for localization. Vacuum disc  phenomenon is present at that level. IMPRESSION: The last film shows localization of the L4-5 interspace. Electronically Signed   By: Ivar Drape M.D.   On: 10/11/2017 10:41   Dg Lumbar Spine 2-3 Views  Result Date: 10/08/2017 CLINICAL DATA:  67 year old male for lumbar spine surgery 10/11/2017. Initial encounter. EXAM: LUMBAR SPINE - 2-3 VIEW COMPARISON:  None. FINDINGS: Normal alignment. Last fully open disc space labeled L5-S1. Correlation with any outside exams prior surgery to confirm this level assignment recommended. Moderate L5-S1 disc space narrowing. Very mild L4-5 disc space narrowing greater on the right. IMPRESSION: Moderate L5-S1 disc space narrowing. Very mild L4-5 disc space narrowing. Electronically Signed   By: Genia Del M.D.   On: 10/08/2017 21:04    EKG:No orders found for this or any previous visit.   Hospital Course: Patient was admitted to [facility] and taken to the OR and underwent the above state procedure without complications.  Patient tolerated the procedure well and was later transferred to the recovery room and then to the orthopaedic floor for postoperative care.  They were given PO and IV analgesics for pain control following their surgery.  They were given 24 hours of postoperative antibiotics.   PT was consulted postop to assist with mobility and transfers.  The patient was allowed to be WBAT with therapy and was taught back precautions. Discharge planning was consulted to help with postop disposition and equipment needs.  Patient had a good night on the evening of surgery and started to get up OOB with therapy on day one. Patient was seen in rounds and was ready to go home on day one.  They were given discharge instructions and dressing directions.  They were instructed on when to follow up in the office with Dr. Tonita Cong.   Diet: Regular diet Activity:WBAT; LSpine precautions Follow-up:in 10-14 days Disposition - Home Discharged Condition:  good    Allergies as of 10/11/2017   No Known Allergies     Medication List    STOP taking these medications   lovastatin 20 MG tablet Commonly known as:  MEVACOR     TAKE these medications   acetaminophen 500 MG tablet Commonly known as:  TYLENOL Take 1,000 mg by mouth every 6 (six) hours as needed (for pain.).   aspirin EC 81 MG tablet Take 1 tablet (81 mg total) by mouth daily after breakfast. Resume 4 days post-op What changed:  additional instructions   cephALEXin 500 MG capsule Commonly known as:  KEFLEX Take 1 capsule (500 mg total) by mouth 4 (four) times daily.   docusate sodium 100 MG capsule Commonly known as:  COLACE Take 1 capsule (100 mg total) by mouth 2 (two) times daily.   lisinopril 10 MG  tablet Commonly known as:  PRINIVIL,ZESTRIL Take 10 mg by mouth daily after breakfast.   meloxicam 15 MG tablet Commonly known as:  MOBIC Take 1 tablet (15 mg total) by mouth daily as needed for pain. Resume 5 days post-op as needed What changed:  additional instructions   metFORMIN 500 MG tablet Commonly known as:  GLUCOPHAGE Take 500 mg by mouth daily after breakfast.   methocarbamol 500 MG tablet Commonly known as:  ROBAXIN Take 1 tablet (500 mg total) by mouth every 6 (six) hours as needed for muscle spasms.   oxyCODONE 5 MG immediate release tablet Commonly known as:  ROXICODONE Take 1-2 tablets (5-10 mg total) by mouth every 6 (six) hours as needed for moderate pain or severe pain.   polyethylene glycol packet Commonly known as:  MIRALAX Take 17 g by mouth daily.   traMADol 50 MG tablet Commonly known as:  ULTRAM Take 1 tablet (50 mg total) by mouth every 6 (six) hours as needed.      Follow-up Information    Susa Day, MD Follow up in 2 week(s).   Specialty:  Orthopedic Surgery Contact information: [address] [place] [address] [phone]           Signed: Lacie Draft, PA-C Orthopaedic  Surgery 10/11/2017, 5:05 PM

## 2017-10-11 NOTE — Transfer of Care (Signed)
Immediate Anesthesia Transfer of Care Note  Patient: Vincent [name]  Procedure(s) Performed: Microlumbar decompression Lumbar Four-Five Left (Left )  Patient Location: PACU  Anesthesia Type:General  Level of Consciousness: awake and sedated  Airway & Oxygen Therapy: Patient connected to face mask oxygen  Post-op Assessment: Post -op Vital signs reviewed and stable  Post vital signs: stable  Last Vitals:  Vitals:   10/11/17 0607 10/11/17 0651  BP: (!) 154/111 (!) 147/101  Pulse: 85   Resp: 20   Temp: 36.8 C   SpO2: 95%     Last Pain:  Vitals:   10/11/17 0644  TempSrc:   PainSc: 7          Complications: No apparent anesthesia complications

## 2017-10-11 NOTE — Brief Op Note (Signed)
10/11/2017  9:52 AM  PATIENT:  Vincent [name]  67 y.o. male  PRE-OPERATIVE DIAGNOSIS:  HERNIATED NUCLEUS PULPOSUS LUMBAR FOUR-FIVE  POST-OPERATIVE DIAGNOSIS:  HERNIATED NUCLEUS PULPOSUS LUMBAR FOUR-FIVE  PROCEDURE:  Procedure(s) with comments: Microlumbar decompression Lumbar Four-Five Left (Left) - Microlumbar decompression Lumbar Four-Five Left  SURGEON:  Surgeon(s) and Role:    Susa Day, MD - Primary  PHYSICIAN ASSISTANT:   ASSISTANTS: Biss[place]   ANESTHESIA:   general  EBL:  50 mL   BLOOD ADMINISTERED:none  DRAINS: none   LOCAL MEDICATIONS USED:  MARCAINE     SPECIMEN:  No Specimen  DISPOSITION OF SPECIMEN:  N/A  COUNTS:  YES  TOURNIQUET:  * No tourniquets in log *  DICTATION: .Other Dictation: Dictation Number [id number]  PLAN OF CARE: Admit for overnight observation  PATIENT DISPOSITION:  PACU - hemodynamically stable.   Delay start of Pharmacological VTE agent (>24hrs) due to surgical blood loss or risk of bleeding: yes

## 2017-10-11 NOTE — Interval H&P Note (Signed)
History and Physical Interval Note:  10/11/2017 7:11 AM  Vincent [name]  has presented today for surgery, with the diagnosis of HNP L4-5  The various methods of treatment have been discussed with the patient and family. After consideration of risks, benefits and other options for treatment, the patient has consented to  Procedure(s) with comments: Microlumbar decompression L4-5 Left (Left) - 120 mins as a surgical intervention .  The patient's history has been reviewed, patient examined, no change in status, stable for surgery.  I have reviewed the patient's chart and labs.  Questions were answered to the patient's satisfaction.     [name]

## 2017-10-11 NOTE — Evaluation (Signed)
Physical Therapy Evaluation and Discharge Patient Details Name: Vincent [name] MRN: [id number] DOB: September 10, 1950 Today's Date: 10/11/2017   History of Present Illness  Pt is a 67 y/o male s/p L4-5 decompression. PMH includes HTN and DM.   Clinical Impression  Patient evaluated by Physical Therapy with no further acute PT needs identified. All education has been completed and the patient has no further questions. Pt at supervision to min guard level for mobility. No physical assist required and pt overall steady with gait and stair navigation. Reviewed back precautions with pt. See below for any follow-up Physical Therapy or equipment needs. PT is signing off. Thank you for this referral. If needs change, please re-consult.      Follow Up Recommendations No PT follow up;Supervision for mobility/OOB    Equipment Recommendations  None recommended by PT    Recommendations for Other Services       Precautions / Restrictions Precautions Precautions: Back Precaution Booklet Issued: Yes (comment) Precaution Comments: Reviewed back precautions with pt.  Restrictions Weight Bearing Restrictions: No      Mobility  Bed Mobility               General bed mobility comments: Walking in hall with NT upon entry. Reviewed log roll technique with pt.   Transfers Overall transfer level: Needs assistance Equipment used: None Transfers: Sit to/from Stand Sit to Stand: Supervision         General transfer comment: Supervision for safety. No physical assist required.   Ambulation/Gait Ambulation/Gait assistance: Supervision Ambulation Distance (Feet): 200 Feet Assistive device: None Gait Pattern/deviations: WFL(Within Functional Limits) Gait velocity: WFL  Gait velocity interpretation: at or above normal speed for age/gender General Gait Details: Overall steady gait. No LOB noted. Educated about generalized walking program to perform at home.   Stairs Stairs: Yes Stairs  assistance: Min guard Stair Management: One rail Left;Step to pattern;Forwards Number of Stairs: 10 General stair comments: Educated about step to pattern to increase safety. Min guard for safety. No external assist required. Overall steady.   Wheelchair Mobility    Modified Rankin (Stroke Patients Only)       Balance Overall balance assessment: Needs assistance Sitting-balance support: No upper extremity supported;Feet supported Sitting balance-Leahy Scale: Good     Standing balance support: No upper extremity supported;During functional activity Standing balance-Leahy Scale: Good                               Pertinent Vitals/Pain Pain Assessment: Faces Faces Pain Scale: Hurts a little bit Pain Location: back  Pain Descriptors / Indicators: Aching Pain Intervention(s): Limited activity within patient's tolerance;Monitored during session;Repositioned    Home Living Family/patient expects to be discharged to:: Private residence Living Arrangements: Spouse/significant other Available Help at Discharge: Family;Available 24 hours/day Type of Home: House Home Access: Stairs to enter Entrance Stairs-Rails: Psychiatric nurse of Steps: 8 Home Layout: Two level Home Equipment: Adaptive equipment      Prior Function Level of Independence: Independent               Hand Dominance        Extremity/Trunk Assessment   Upper Extremity Assessment Upper Extremity Assessment: Defer to OT evaluation    Lower Extremity Assessment Lower Extremity Assessment: Overall WFL for tasks assessed    Cervical / Trunk Assessment Cervical / Trunk Assessment: Other exceptions Cervical / Trunk Exceptions: s/p lumbar surgery   Communication   Communication: No  difficulties  Cognition Arousal/Alertness: Awake/alert Behavior During Therapy: WFL for tasks assessed/performed Overall Cognitive Status: Within Functional Limits for tasks assessed                                         General Comments      Exercises     Assessment/Plan    PT Assessment Patent does not need any further PT services  PT Problem List         PT Treatment Interventions      PT Goals (Current goals can be found in the Care Plan section)  Acute Rehab PT Goals Patient Stated Goal: to go home  PT Goal Formulation: With patient Time For Goal Achievement: 10/11/17 Potential to Achieve Goals: Good    Frequency     Barriers to discharge        Co-evaluation               AM-PAC PT "6 Clicks" Daily Activity  Outcome Measure Difficulty turning over in bed (including adjusting bedclothes, sheets and blankets)?: None Difficulty moving from lying on back to sitting on the side of the bed? : A Little Difficulty sitting down on and standing up from a chair with arms (e.g., wheelchair, bedside commode, etc,.)?: None Help needed moving to and from a bed to chair (including a wheelchair)?: None Help needed walking in hospital room?: None Help needed climbing 3-5 steps with a railing? : A Little 6 Click Score: 22    End of Session Equipment Utilized During Treatment: Gait belt Activity Tolerance: Patient tolerated treatment well Patient left: in chair;with call bell/phone within reach Nurse Communication: Mobility status PT Visit Diagnosis: Other abnormalities of gait and mobility (R26.89);Pain Pain - part of body: (back )    Time: 6387-5643 PT Time Calculation (min) (ACUTE ONLY): 13 min   Charges:   PT Evaluation $PT Eval Low Complexity: 1 Low     PT G Codes:        Leighton Ruff, PT, DPT  Acute Rehabilitation Services  Pager: [phone]   Rudean Hitt 10/11/2017, 2:25 PM

## 2017-10-12 ENCOUNTER — Encounter (HOSPITAL_COMMUNITY): Payer: Self-pay | Admitting: Specialist

## 2017-10-12 DIAGNOSIS — M48061 Spinal stenosis, lumbar region without neurogenic claudication: Secondary | ICD-10-CM | POA: Diagnosis not present

## 2017-10-12 LAB — CBC
HEMATOCRIT: 40 % (ref 39.0–52.0)
Hemoglobin: 13.5 g/dL (ref 13.0–17.0)
MCH: 32.8 pg (ref 26.0–34.0)
MCHC: 33.8 g/dL (ref 30.0–36.0)
MCV: 97.1 fL (ref 78.0–100.0)
Platelets: 151 10*3/uL (ref 150–400)
RBC: 4.12 MIL/uL — AB (ref 4.22–5.81)
RDW: 12.8 % (ref 11.5–15.5)
WBC: 7.8 10*3/uL (ref 4.0–10.5)

## 2017-10-12 LAB — BASIC METABOLIC PANEL
ANION GAP: 8 (ref 5–15)
BUN: 13 mg/dL (ref 6–20)
CHLORIDE: 107 mmol/L (ref 101–111)
CO2: 24 mmol/L (ref 22–32)
Calcium: 8.7 mg/dL — ABNORMAL LOW (ref 8.9–10.3)
Creatinine, Ser: 1.06 mg/dL (ref 0.61–1.24)
GFR calc Af Amer: >60 mL/min (ref >60)
GLUCOSE: 133 mg/dL — AB (ref 65–99)
POTASSIUM: 4 mmol/L (ref 3.5–5.1)
Sodium: 139 mmol/L (ref 135–145)

## 2017-10-12 LAB — GLUCOSE, CAPILLARY: Glucose-Capillary: 124 mg/dL — ABNORMAL HIGH (ref 65–99)

## 2017-10-12 MED FILL — Thrombin For Soln 20000 Unit: CUTANEOUS | Qty: 1 | Status: AC

## 2017-10-12 MED FILL — Thrombin For Soln 5000 Unit: CUTANEOUS | Qty: 5000 | Status: AC

## 2017-10-12 NOTE — Progress Notes (Signed)
Patient alert and oriented, mae's well, voiding adequate amount of urine, swallowing without difficulty, no c/o pain at time of discharge. Patient discharged home with family. Script and discharged instructions given to patient. Patient and family stated understanding of instructions given. Patient has an appointment with Dr. Beane ?

## 2017-10-12 NOTE — Progress Notes (Signed)
Subjective: 1 Day Post-Op Procedure(s) (LRB): Microlumbar decompression Lumbar Four-Five Left (Left) Patient reports pain as mild.   Reports mild soreness lower back. No leg pain. Ongoing leg numbness. Walked halls yesterday, tolerated well. Voiding without difficulty. No c/o, ready to go home.  Objective: Vital signs in last 24 hours: Temp:  [97.7 F (36.5 C)-98.6 F (37 C)] 98.3 F (36.8 C) (03/08 0400) Pulse Rate:  [71-98] 75 (03/08 0400) Resp:  [13-20] 20 (03/08 0400) BP: (117-145)/(63-99) 145/88 (03/08 0400) SpO2:  [94 %-99 %] 96 % (03/08 0400)  Intake/Output from previous day: 03/07 0701 - 03/08 0700 In: 1720 [P.O.:720; I.V.:1000] Out: 50 [Blood:50] Intake/Output this shift: No intake/output data recorded.  No results for input(s): HGB in the last 72 hours. No results for input(s): WBC, RBC, HCT, PLT in the last 72 hours. No results for input(s): NA, K, CL, CO2, BUN, CREATININE, GLUCOSE, CALCIUM in the last 72 hours. No results for input(s): LABPT, INR in the last 72 hours.  Neurologically intact ABD soft Neurovascular intact Sensation intact distally Intact pulses distally Dorsiflexion/Plantar flexion intact Incision: dressing C/D/I and no drainage No cellulitis present Compartment soft no calf pain or sign of DVT  Assessment/Plan: 1 Day Post-Op Procedure(s) (LRB): Microlumbar decompression Lumbar Four-Five Left (Left) Advance diet Up with therapy D/C IV fluids  Discussed D/C instructions, Lspine precautions, dressing instructions D/C home Follow up in office in 10-14 days for suture removal Will discuss with Dr. Mliss Fritz, Conley Rolls. 10/12/2017, 7:43 AM

## 2017-10-22 NOTE — Anesthesia Postprocedure Evaluation (Signed)
Anesthesia Post Note  Patient: Vincent [name]  Procedure(s) Performed: Microlumbar decompression Lumbar Four-Five Left (Left )     Patient location during evaluation: PACU Anesthesia Type: General Level of consciousness: awake Pain management: pain level controlled Vital Signs Assessment: post-procedure vital signs reviewed and stable Respiratory status: spontaneous breathing Cardiovascular status: stable Postop Assessment: no apparent nausea or vomiting Anesthetic complications: no    Last Vitals:  Vitals:   10/12/17 0400 10/12/17 0810  BP: (!) 145/88 132/86  Pulse: 75 89  Resp: 20 20  Temp: 36.8 C 37.2 C  SpO2: 96% 98%    Last Pain:  Vitals:   10/12/17 0810  TempSrc: Oral  PainSc:    Pain Goal: Patients Stated Pain Goal: 2 (10/11/17 1920)               Aceton Kinnear JR,JOHN Mateo Flow

## 2017-12-04 ENCOUNTER — Encounter (INDEPENDENT_AMBULATORY_CARE_PROVIDER_SITE_OTHER): Payer: BLUE CROSS/BLUE SHIELD | Admitting: Ophthalmology

## 2017-12-04 DIAGNOSIS — H34811 Central retinal vein occlusion, right eye, with macular edema: Secondary | ICD-10-CM

## 2017-12-04 DIAGNOSIS — I10 Essential (primary) hypertension: Secondary | ICD-10-CM | POA: Diagnosis not present

## 2017-12-04 DIAGNOSIS — H35033 Hypertensive retinopathy, bilateral: Secondary | ICD-10-CM | POA: Diagnosis not present

## 2017-12-04 DIAGNOSIS — H2513 Age-related nuclear cataract, bilateral: Secondary | ICD-10-CM | POA: Diagnosis not present

## 2017-12-04 DIAGNOSIS — H43813 Vitreous degeneration, bilateral: Secondary | ICD-10-CM | POA: Diagnosis not present

## 2017-12-05 ENCOUNTER — Other Ambulatory Visit: Payer: Self-pay | Admitting: Family Medicine

## 2017-12-05 DIAGNOSIS — H53001 Unspecified amblyopia, right eye: Secondary | ICD-10-CM

## 2017-12-11 ENCOUNTER — Ambulatory Visit
Admission: RE | Admit: 2017-12-11 | Discharge: 2017-12-11 | Disposition: A | Discharge status: Home / Self Care | Payer: BLUE CROSS/BLUE SHIELD | Source: Ambulatory Visit | Attending: Family Medicine | Admitting: Family Medicine

## 2017-12-11 DIAGNOSIS — H53001 Unspecified amblyopia, right eye: Secondary | ICD-10-CM

## 2018-01-29 ENCOUNTER — Encounter (INDEPENDENT_AMBULATORY_CARE_PROVIDER_SITE_OTHER): Payer: BLUE CROSS/BLUE SHIELD | Admitting: Ophthalmology

## 2018-01-29 DIAGNOSIS — H2513 Age-related nuclear cataract, bilateral: Secondary | ICD-10-CM

## 2018-01-29 DIAGNOSIS — H34811 Central retinal vein occlusion, right eye, with macular edema: Secondary | ICD-10-CM | POA: Diagnosis not present

## 2018-01-29 DIAGNOSIS — H35033 Hypertensive retinopathy, bilateral: Secondary | ICD-10-CM | POA: Diagnosis not present

## 2018-01-29 DIAGNOSIS — I10 Essential (primary) hypertension: Secondary | ICD-10-CM

## 2018-01-29 DIAGNOSIS — H34231 Retinal artery branch occlusion, right eye: Secondary | ICD-10-CM | POA: Diagnosis not present

## 2018-01-29 DIAGNOSIS — H43813 Vitreous degeneration, bilateral: Secondary | ICD-10-CM

## 2018-04-02 ENCOUNTER — Encounter (INDEPENDENT_AMBULATORY_CARE_PROVIDER_SITE_OTHER): Payer: BLUE CROSS/BLUE SHIELD | Admitting: Ophthalmology

## 2018-04-02 DIAGNOSIS — H35033 Hypertensive retinopathy, bilateral: Secondary | ICD-10-CM

## 2018-04-02 DIAGNOSIS — I10 Essential (primary) hypertension: Secondary | ICD-10-CM | POA: Diagnosis not present

## 2018-04-02 DIAGNOSIS — H43813 Vitreous degeneration, bilateral: Secondary | ICD-10-CM

## 2018-04-02 DIAGNOSIS — H34811 Central retinal vein occlusion, right eye, with macular edema: Secondary | ICD-10-CM

## 2018-04-02 DIAGNOSIS — H2513 Age-related nuclear cataract, bilateral: Secondary | ICD-10-CM

## 2018-06-18 ENCOUNTER — Encounter (INDEPENDENT_AMBULATORY_CARE_PROVIDER_SITE_OTHER): Payer: BLUE CROSS/BLUE SHIELD | Admitting: Ophthalmology

## 2018-06-18 DIAGNOSIS — H43813 Vitreous degeneration, bilateral: Secondary | ICD-10-CM

## 2018-06-18 DIAGNOSIS — I10 Essential (primary) hypertension: Secondary | ICD-10-CM

## 2018-06-18 DIAGNOSIS — H34811 Central retinal vein occlusion, right eye, with macular edema: Secondary | ICD-10-CM | POA: Diagnosis not present

## 2018-06-18 DIAGNOSIS — H35033 Hypertensive retinopathy, bilateral: Secondary | ICD-10-CM | POA: Diagnosis not present

## 2018-06-18 DIAGNOSIS — H2513 Age-related nuclear cataract, bilateral: Secondary | ICD-10-CM

## 2018-09-03 ENCOUNTER — Encounter (INDEPENDENT_AMBULATORY_CARE_PROVIDER_SITE_OTHER): Payer: BLUE CROSS/BLUE SHIELD | Admitting: Ophthalmology

## 2018-09-03 DIAGNOSIS — I10 Essential (primary) hypertension: Secondary | ICD-10-CM

## 2018-09-03 DIAGNOSIS — H43813 Vitreous degeneration, bilateral: Secondary | ICD-10-CM

## 2018-09-03 DIAGNOSIS — H34811 Central retinal vein occlusion, right eye, with macular edema: Secondary | ICD-10-CM

## 2018-09-03 DIAGNOSIS — H35033 Hypertensive retinopathy, bilateral: Secondary | ICD-10-CM

## 2018-09-03 DIAGNOSIS — H34231 Retinal artery branch occlusion, right eye: Secondary | ICD-10-CM | POA: Diagnosis not present

## 2018-09-03 DIAGNOSIS — H2513 Age-related nuclear cataract, bilateral: Secondary | ICD-10-CM

## 2018-10-09 ENCOUNTER — Other Ambulatory Visit: Payer: Self-pay

## 2018-10-09 ENCOUNTER — Encounter (HOSPITAL_COMMUNITY): Payer: Self-pay | Admitting: Emergency Medicine

## 2018-10-09 ENCOUNTER — Ambulatory Visit (INDEPENDENT_AMBULATORY_CARE_PROVIDER_SITE_OTHER)
Admission: EM | Admit: 2018-10-09 | Discharge: 2018-10-09 | Disposition: A | Discharge status: Home / Self Care | Payer: BLUE CROSS/BLUE SHIELD | Source: Home / Self Care | Attending: Family Medicine | Admitting: Family Medicine

## 2018-10-09 ENCOUNTER — Emergency Department (HOSPITAL_COMMUNITY)
Admission: EM | Admit: 2018-10-09 | Discharge: 2018-10-10 | Disposition: A | Discharge status: Home / Self Care | Payer: BLUE CROSS/BLUE SHIELD | Attending: Emergency Medicine | Admitting: Emergency Medicine

## 2018-10-09 ENCOUNTER — Encounter (HOSPITAL_COMMUNITY): Payer: Self-pay

## 2018-10-09 DIAGNOSIS — K5732 Diverticulitis of large intestine without perforation or abscess without bleeding: Secondary | ICD-10-CM | POA: Diagnosis not present

## 2018-10-09 DIAGNOSIS — R1010 Upper abdominal pain, unspecified: Secondary | ICD-10-CM | POA: Diagnosis present

## 2018-10-09 DIAGNOSIS — Z79899 Other long term (current) drug therapy: Secondary | ICD-10-CM | POA: Insufficient documentation

## 2018-10-09 DIAGNOSIS — Z7982 Long term (current) use of aspirin: Secondary | ICD-10-CM | POA: Insufficient documentation

## 2018-10-09 DIAGNOSIS — Z7984 Long term (current) use of oral hypoglycemic drugs: Secondary | ICD-10-CM | POA: Insufficient documentation

## 2018-10-09 DIAGNOSIS — K869 Disease of pancreas, unspecified: Secondary | ICD-10-CM | POA: Diagnosis not present

## 2018-10-09 DIAGNOSIS — K30 Functional dyspepsia: Secondary | ICD-10-CM | POA: Diagnosis not present

## 2018-10-09 DIAGNOSIS — R1013 Epigastric pain: Secondary | ICD-10-CM

## 2018-10-09 DIAGNOSIS — K8689 Other specified diseases of pancreas: Secondary | ICD-10-CM

## 2018-10-09 DIAGNOSIS — I10 Essential (primary) hypertension: Secondary | ICD-10-CM | POA: Diagnosis not present

## 2018-10-09 DIAGNOSIS — E119 Type 2 diabetes mellitus without complications: Secondary | ICD-10-CM | POA: Diagnosis not present

## 2018-10-09 LAB — COMPREHENSIVE METABOLIC PANEL
ALBUMIN: 4 g/dL (ref 3.5–5.0)
ALT: 26 U/L (ref 0–44)
ANION GAP: 12 (ref 5–15)
AST: 18 U/L (ref 15–41)
Alkaline Phosphatase: 60 U/L (ref 38–126)
BUN: 28 mg/dL — AB (ref 8–23)
CALCIUM: 9.8 mg/dL (ref 8.9–10.3)
CHLORIDE: 106 mmol/L (ref 98–111)
CO2: 20 mmol/L — ABNORMAL LOW (ref 22–32)
Creatinine, Ser: 1.06 mg/dL (ref 0.61–1.24)
GFR calc Af Amer: >60 mL/min (ref >60)
Glucose, Bld: 131 mg/dL — ABNORMAL HIGH (ref 70–99)
POTASSIUM: 4.3 mmol/L (ref 3.5–5.1)
Sodium: 138 mmol/L (ref 135–145)
Total Bilirubin: 0.7 mg/dL (ref 0.3–1.2)
Total Protein: 7.3 g/dL (ref 6.5–8.1)

## 2018-10-09 LAB — CBC
HEMATOCRIT: 46.4 % (ref 39.0–52.0)
Hemoglobin: 15.4 g/dL (ref 13.0–17.0)
MCH: 33 pg (ref 26.0–34.0)
MCHC: 33.2 g/dL (ref 30.0–36.0)
MCV: 99.6 fL (ref 80.0–100.0)
Platelets: 172 10*3/uL (ref 150–400)
RBC: 4.66 MIL/uL (ref 4.22–5.81)
RDW: 12.5 % (ref 11.5–15.5)
WBC: 9.1 10*3/uL (ref 4.0–10.5)
nRBC: 0 % (ref 0.0–0.2)

## 2018-10-09 LAB — LIPASE, BLOOD: LIPASE: 34 U/L (ref 11–51)

## 2018-10-09 MED ORDER — SODIUM CHLORIDE 0.9% FLUSH: 3.0000 mL | Freq: Once | INTRAVENOUS | Status: DC

## 2018-10-09 NOTE — ED Triage Notes (Signed)
Pt presents with recurrent epigastric pain and loss of appetite for over a month; pt states the pain comes and goes and it centered mostly around eating.

## 2018-10-09 NOTE — ED Provider Notes (Signed)
[place]   [id number] 10/09/18 Arrival Time: 3875  ASSESSMENT & PLAN:  1. Epigastric pain    After discussion, he elects ED evaluation. Certainly with concerning symptoms. Will need imaging. Stable upon discharge here.  Follow-up Information    Go to  [facility].   Specialty:  Emergency Medicine Contact information: [address] [id number] [place] [phone]         Reviewed expectations re: course of current medical issues. Questions answered. Outlined signs and symptoms indicating need for more acute intervention. Patient verbalized understanding. After Visit Summary given.   SUBJECTIVE: History from: patient. Mackinley Quayshaun Hubbert is a 68 y.o. male with DM who presents with complaint of intermittent epigastric abdominal discomfort. Onset gradual, 2-3 months ago. Discomfort described as aching; with occasional radiation to his back. Symptoms are worsening since beginning. Has seen his PCP who attempted to order a CT of his abdomen. Also reports 30-40 pound weight loss; has been trying to lose weight. He reports his insurance company wanted an [place] first. Has not had this ordered yet. Fever: absent. Aggravating factors: include eating. Alleviating factors: have not been identified. Associated symptoms: fatigue. He denies arthralgias, constipation, diarrhea, nausea and vomiting. Appetite: decreased. PO intake: decreased. Ambulatory without assistance. Urinary symptoms: none. Bowel movements: have not significantly changed; last bowel movement withinthe past couple of days and  without blood. OTC treatment: occasional Goody powder without much relief. Rare alcohol use.  Social History   Tobacco Use  Smoking Status Never Smoker  Smokeless Tobacco Never Used    Past Surgical History:  Procedure Laterality Date  . LUMBAR LAMINECTOMY/DECOMPRESSION MICRODISCECTOMY Left 10/11/2017   Procedure: Microlumbar decompression Lumbar Four-Five Left;  Surgeon: Susa Day, MD;  Location: [facility];  Service: Orthopedics;  Laterality: Left;  Microlumbar decompression Lumbar Four-Five Left  . removal of wisdom teeth  1977    ROS: As per HPI. All other systems negative.  OBJECTIVE:  Vitals:   10/09/18 1513  BP: 106/73  Pulse: (!) 105  Resp: 20  Temp: 98.7 F (37.1 C)  SpO2: 97%    General appearance: alert, oriented, no acute distress  Oropharynx: moist Lungs: clear to auscultation bilaterally; unlabored respirations Heart: slight tachycardia; regular rhythm Abdomen: soft; without distention; mild to moderate upper abdominal tenderness; normal bowel sounds; withoutappreciable masses or organomegaly; without guarding or rebound tenderness Back: without CVA tenderness; FROM at waist Extremities: without LE edema; symmetrical; without gross deformities Skin: warm and dry Neurologic: normal gait Psychological: alert and cooperative; normal mood and affect   Allergies  Allergen Reactions  . Oxycodone                                                Past Medical History:  Diagnosis Date  . Diabetes mellitus without complication ([place])    pt. states that he is not diabeticalthought he takes metformin  . Eye hemorrhage    right  . Hypertension    Social History   Socioeconomic History  . Marital status: Married    Spouse name: Not on file  . Number of children: Not on file  . Years of education: Not on file  . Highest education level: Not on file  Occupational History  . Not on file  Social Needs  . Financial resource strain: Not on file  .  Food insecurity:    Worry: Not on file    Inability: Not on file  . Transportation needs:    Medical: Not on file    Non-medical: Not on file  Tobacco Use  . Smoking status: Never Smoker  . Smokeless tobacco: Never Used  Substance and Sexual Activity  . Alcohol use: Yes    Comment: rarely  . Drug use: No  . Sexual  activity: Not on file  Lifestyle  . Physical activity:    Days per week: Not on file    Minutes per session: Not on file  . Stress: Not on file  Relationships  . Social connections:    Talks on phone: Not on file    Gets together: Not on file    Attends religious service: Not on file    Active member of club or organization: Not on file    Attends meetings of clubs or organizations: Not on file    Relationship status: Not on file  . Intimate partner violence:    Fear of current or ex partner: Not on file    Emotionally abused: Not on file    Physically abused: Not on file    Forced sexual activity: Not on file  Other Topics Concern  . Not on file  Social History Narrative  . Not on file   FH: Question of HTN.   Vanessa Kick, MD 10/24/18 [phone]

## 2018-10-09 NOTE — ED Triage Notes (Signed)
Pt. Stated, Ive have chest pain to touch  And its a sickening discomfort. This started 2 months ago.

## 2018-10-09 NOTE — ED Provider Notes (Signed)
[facility] EMERGENCY DEPARTMENT Provider Note   CSN: [id number] Arrival date & time: 10/09/18  1716    History   Chief Complaint Chief Complaint  Patient presents with  . Chest Pain  . Abdominal Pain    HPI Vincent [name] is a 68 y.o. male.   The history is provided by the patient.  He has history of hypertension and diabetes and comes in complaining of upper abdominal pain for the last 2 months.  Pain is constant but clearly worse after eating.  There is some radiation of pain to the back.  He denies nausea or vomiting.  He has had a 40 pound weight loss, but that has been mostly voluntary.  Pain has gotten to the point where it is waking him up at night.  He rates pain at 8/10 at rest.  He had been taking Goody powder, but stopped taking that and is now taking acetaminophen without relief.  He is a non-smoker and rarely drinks alcohol.  He denies NSAID use.  He has been under a lot of stress.  Past Medical History:  Diagnosis Date  . Diabetes mellitus without complication ([place])    pt. states that he is not diabeticalthought he takes metformin  . Eye hemorrhage    right  . Hypertension     Patient Active Problem List   Diagnosis Date Noted  . HNP (herniated nucleus pulposus), lumbar 10/11/2017  . Spinal stenosis at L4-L5 level 10/11/2017    Past Surgical History:  Procedure Laterality Date  . LUMBAR LAMINECTOMY/DECOMPRESSION MICRODISCECTOMY Left 10/11/2017   Procedure: Microlumbar decompression Lumbar Four-Five Left;  Surgeon: Susa Day, MD;  Location: [facility];  Service: Orthopedics;  Laterality: Left;  Microlumbar decompression Lumbar Four-Five Left  . removal of wisdom teeth  1977        Home Medications    Prior to Admission medications   Medication Sig Start Date End Date Taking? Authorizing Provider  acetaminophen (TYLENOL) 500 MG tablet Take 1,000 mg by mouth every 6 (six) hours as needed (for pain.).    [provider]    aspirin EC 81 MG tablet Take 1 tablet (81 mg total) by mouth daily after breakfast. Resume 4 days post-op 10/11/17   Cecilie Kicks, PA-C  cephALEXin (KEFLEX) 500 MG capsule Take 1 capsule (500 mg total) by mouth 4 (four) times daily. 11/29/13   Gregor Hams, MD  docusate sodium (COLACE) 100 MG capsule Take 1 capsule (100 mg total) by mouth 2 (two) times daily. 10/11/17 10/11/18  Cecilie Kicks, PA-C  lisinopril (PRINIVIL,ZESTRIL) 10 MG tablet Take 10 mg by mouth daily after breakfast.    [provider]  meloxicam (MOBIC) 15 MG tablet Take 1 tablet (15 mg total) by mouth daily as needed for pain. Resume 5 days post-op as needed 10/11/17   Cecilie Kicks, PA-C  metFORMIN (GLUCOPHAGE) 500 MG tablet Take 1,000 mg by mouth daily after breakfast.     [provider]  methocarbamol (ROBAXIN) 500 MG tablet Take 1 tablet (500 mg total) by mouth every 6 (six) hours as needed for muscle spasms. 10/11/17   Cecilie Kicks, PA-C  oxyCODONE (ROXICODONE) 5 MG immediate release tablet Take 1-2 tablets (5-10 mg total) by mouth every 6 (six) hours as needed for moderate pain or severe pain. 10/11/17 10/11/18  Cecilie Kicks, PA-C  polyethylene glycol [facility]) packet Take 17 g by mouth daily. 10/11/17   Cecilie Kicks, PA-C  traMADol [facility])  50 MG tablet Take 1 tablet (50 mg total) by mouth every 6 (six) hours as needed. 11/29/13   Gregor Hams, MD    Family History No family history on file.  Social History Social History   Tobacco Use  . Smoking status: Never Smoker  . Smokeless tobacco: Never Used  Substance Use Topics  . Alcohol use: Yes    Comment: rarely  . Drug use: No     Allergies   Oxycodone   Review of Systems Review of Systems  All other systems reviewed and are negative.    Physical Exam Updated Vital Signs BP 109/72 (BP Location: Right Arm)   Pulse 86   Temp 98.3 F (36.8 C)   Resp (!) 21   Ht 6\' 4"  (1.93 m)   Wt 114.8 kg   SpO2 94%   BMI 30.80  kg/m   Physical Exam Vitals signs and nursing note reviewed.    68 year old male, resting comfortably and in no acute distress. Vital signs are normal. Oxygen saturation is 94%, which is normal. Head is normocephalic and atraumatic. PERRLA, EOMI. Oropharynx is clear. Neck is nontender and supple without adenopathy or JVD. Back is nontender and there is no CVA tenderness. Lungs are clear without rales, wheezes, or rhonchi. Chest is nontender. Heart has regular rate and rhythm without murmur. Abdomen is soft, flat, with moderate epigastric tenderness.  There is no rebound or guarding.  There are no masses or hepatosplenomegaly and peristalsis is normoactive. Extremities have no cyanosis or edema, full range of motion is present. Skin is warm and dry without rash. Neurologic: Mental status is normal, cranial nerves are intact, there are no motor or sensory deficits.  ED Treatments / Results  Labs (all labs ordered are listed, but only abnormal results are displayed) Labs Reviewed  COMPREHENSIVE METABOLIC PANEL - Abnormal; Notable for the following components:      Result Value   CO2 20 (*)    Glucose, Bld 131 (*)    BUN 28 (*)    All other components within normal limits  LIPASE, BLOOD  CBC  URINALYSIS, ROUTINE W REFLEX MICROSCOPIC    EKG EKG Interpretation  Date/Time:  Wednesday October 09 2018 18:08:11 EST Ventricular Rate:  86 PR Interval:  166 QRS Duration: 112 QT Interval:  366 QTC Calculation: 437 R Axis:   -16 Text Interpretation:  Normal sinus rhythm Normal ECG No old tracing to compare Confirmed by Delora Fuel ([id number]) on 10/09/2018 10:54:36 PM   Radiology Ct Abdomen Pelvis W Contrast  Result Date: 10/10/2018 CLINICAL DATA:  Abd pain, diverticulitis suspected. Technologist notes state recurrent epigastric pain and decreased appetite 1 month. EXAM: CT ABDOMEN AND PELVIS WITH CONTRAST TECHNIQUE: Multidetector CT imaging of the abdomen and pelvis was performed using  the standard protocol following bolus administration of intravenous contrast. CONTRAST:  171mL OMNIPAQUE IOHEXOL 300 MG/ML  SOLN COMPARISON:  None. FINDINGS: Lower chest: The lung bases are clear. Hepatobiliary: 16 mm water density lesion in the left lobe of the liver most consistent with cyst. Gallbladder physiologically distended, no calcified stone. No biliary dilatation. Pancreas: Ill-defined low-density in the pancreatic tail measures at least 4.7 cm in length, with possible punctate calcifications peripherally. Mild associated fat stranding. No discrete peripancreatic fluid collection. Spleen: Spleen is normal in size. No focal parenchymal abnormality. Main splenic vein is not confidently visualized, there are multiple collateral vessels at the splenic hilum, findings suggestive of splenic vein occlusion. Adrenals/Urinary Tract: No adrenal nodule. No  hydronephrosis or perinephric edema. Homogeneous renal enhancement with symmetric excretion on delayed phase imaging. Urinary bladder is physiologically distended without wall thickening. Stomach/Bowel: Inflamed diverticulum in the proximal sigmoid colon with pericolonic fat stranding no perforation or abscess. Multiple additional noninflamed diverticula throughout the colon, most prominent distally. Normal appendix small bowel is unremarkable. Stomach is nondistended and not well assessed. Vascular/Lymphatic: Findings suspicious for splenic vein occlusion as described. Portal vein and mesenteric vessels are patent. Mild aortic atherosclerosis without aneurysm. No definite enlarged lymph nodes, with special attention to the peripancreatic region. Reproductive: Prostate is unremarkable. Other: No free air, free fluid, or intra-abdominal fluid collection. Small fat containing umbilical hernia. Minimal fat in the inguinal canals. Musculoskeletal: There are no acute or suspicious osseous abnormalities. Degenerative change in the spine and pubic symphysis. IMPRESSION:  1. Uncomplicated acute diverticulitis of the proximal sigmoid colon. 2. Ill-defined low-density in the pancreatic tail measuring at least 4.7 cm, with possible punctate peripheral calcifications. This is suspicious for pancreatic neoplasm, however sequela of prior pancreatitis is also considered. Recommend further characterization with pancreatic protocol MRI when patient is able to tolerate breath hold technique. 3. 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 4. 16 mm cyst in the left lobe of the liver. Aortic Atherosclerosis (ICD10-I70.0). Electronically Signed   By: Keith Rake M.D.   On: 10/10/2018 01:35    Procedures Procedures   Medications Ordered in ED Medications  sodium chloride flush (NS) 0.9 % injection 3 mL (has no administration in time range)     Initial Impression / Assessment and Plan / ED Course  I have reviewed the triage vital signs and the nursing notes.  Pertinent labs & imaging results that were available during my care of the patient were reviewed by me and considered in my medical decision making (see chart for details).  Upper abdominal pain which is likely either gastritis or peptic ulcer disease or GERD.  Doubt pancreatitis, cholecystitis, diverticulitis.  He was seen at urgent care and sent here for CT scan.  Screening labs are unremarkable.  We will will send for CT abdomen and pelvis.  Old records are reviewed, and he has no relevant past visits other than the urgent care visit earlier today.  CT scan shows an area of diverticulitis in the sigmoid colon.  This is actually an incidental finding as he does not have symptoms suggestive of diverticulitis.  However, we will discharge with prescriptions for ciprofloxacin and metronidazole.  CT also shows possible pancreatic mass versus scarring from prior episode of pancreatitis.  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  Patient is advised of these findings, as well as recommendation to get pancreatic protocol MRI scan done as an outpatient.  He was given a therapeutic trial of GI cocktail and pantoprazole, and did get good relief of his pain.  I still feel his symptoms are related to either gastritis or ulcer, or possibly GERD.  He is discharged with a prescription for pantoprazole and is referred to gastroenterology.  Final Clinical Impressions(s) / ED Diagnoses   Final diagnoses:  Dyspepsia  Diverticulitis of sigmoid colon  Mass of pancreas    ED Discharge Orders         Ordered    pantoprazole (PROTONIX) 40 MG tablet  Daily,   Status:  Discontinued     10/10/18 0248    ciprofloxacin (CIPRO) 500 MG tablet  2 times daily  10/10/18 0249    pantoprazole (PROTONIX) 40 MG tablet  Daily     10/10/18 0249    metroNIDAZOLE (FLAGYL) 500 MG tablet  3 times daily     10/10/18 2284           Delora Fuel, MD 06[birth year]61 [phone]

## 2018-10-10 ENCOUNTER — Emergency Department (HOSPITAL_COMMUNITY): Payer: BLUE CROSS/BLUE SHIELD

## 2018-10-10 MED ORDER — PANTOPRAZOLE SODIUM 40 MG PO TBEC: 40.0000 mg | DELAYED_RELEASE_TABLET | Freq: Every day | ORAL | 0 refills | Status: DC

## 2018-10-10 MED ORDER — PANTOPRAZOLE SODIUM 40 MG PO TBEC
40.0000 mg | DELAYED_RELEASE_TABLET | Freq: Once | ORAL | Status: AC
Start: 2018-10-10 — End: 2018-10-10
  Administered 2018-10-10: 40 mg via ORAL
  Filled 2018-10-10: qty 1

## 2018-10-10 MED ORDER — LIDOCAINE VISCOUS HCL 2 % MT SOLN
15.0000 mL | Freq: Once | OROMUCOSAL | Status: AC
Start: 2018-10-10 — End: 2018-10-10
  Administered 2018-10-10: 15 mL via ORAL
  Filled 2018-10-10: qty 15

## 2018-10-10 MED ORDER — CIPROFLOXACIN HCL 500 MG PO TABS: 500.0000 mg | ORAL_TABLET | Freq: Two times a day (BID) | ORAL | 0 refills | Status: DC

## 2018-10-10 MED ORDER — ALUM & MAG HYDROXIDE-SIMETH 200-200-20 MG/5ML PO SUSP
30.0000 mL | Freq: Once | ORAL | Status: AC
  Administered 2018-10-10: 30 mL via ORAL
  Filled 2018-10-10: qty 30

## 2018-10-10 MED ORDER — IOHEXOL 300 MG/ML  SOLN
125.0000 mL | Freq: Once | INTRAMUSCULAR | Status: AC | PRN
  Administered 2018-10-10: 125 mL via INTRAVENOUS

## 2018-10-10 MED ORDER — METRONIDAZOLE 500 MG PO TABS: 500.0000 mg | ORAL_TABLET | Freq: Three times a day (TID) | ORAL | 0 refills | Status: DC

## 2018-10-10 NOTE — Discharge Instructions (Addendum)
Your CT scan showed a small area of diverticulitis.  I do not feel this is what is causing your pain, but you are being given antibiotics to treat it.  Your CT also showed a mass in the tail of your pancreas.  There is a concern that this could be cancer.  It could also be scar tissue from a prior episode of an inflamed pancreas.  Radiologist recommends pancreatic protocol MRI scan be obtained as an outpatient.  This can be ordered by your primary care provider, or by the gastroenterologist that you were referred to.  I believe you have some irritation in your stomach that is causing the pain that made you come to the emergency department tonight.  This could be irritation of the stomach, an ulcer, or acid reflux.  You are being given a prescription for pantoprazole.  Please take this once a day.

## 2018-10-10 NOTE — ED Notes (Signed)
Patient verbalizes understanding of discharge instructions. Opportunity for questioning and answers were provided. Armband removed by staff, pt discharged from ED.  

## 2018-10-14 ENCOUNTER — Other Ambulatory Visit: Payer: Self-pay | Admitting: Family Medicine

## 2018-10-14 DIAGNOSIS — K869 Disease of pancreas, unspecified: Secondary | ICD-10-CM

## 2018-10-14 DIAGNOSIS — R93813 Abnormal radiologic findings on diagnostic imaging of testicles, bilateral: Secondary | ICD-10-CM

## 2018-10-20 ENCOUNTER — Other Ambulatory Visit: Payer: Self-pay

## 2018-10-22 ENCOUNTER — Telehealth: Payer: Self-pay

## 2018-10-22 NOTE — Telephone Encounter (Signed)
Called patient to advise that we will wait to schedule med/onc initial consult until after his appointment with Dr. Watt Climes on 10/25/18. Patient also advised to bring copy of his MRI scan to [facility] to be loaded into PACS. Patient has my direct number for questions or concerns.

## 2018-10-22 NOTE — Progress Notes (Signed)
  Oncology Nurse Navigator Documentation     Patient dropped off [facility] MRI scan on disc. Disc taken to radiology to be loaded into PACS.

## 2018-10-24 ENCOUNTER — Other Ambulatory Visit (HOSPITAL_COMMUNITY): Payer: Self-pay | Admitting: Oncology

## 2018-10-24 ENCOUNTER — Ambulatory Visit
Admission: RE | Admit: 2018-10-24 | Discharge: 2018-10-24 | Disposition: A | Discharge status: Home / Self Care | Payer: Self-pay | Source: Ambulatory Visit | Attending: Oncology | Admitting: Oncology

## 2018-10-24 DIAGNOSIS — C801 Malignant (primary) neoplasm, unspecified: Secondary | ICD-10-CM

## 2018-10-25 ENCOUNTER — Other Ambulatory Visit: Payer: Self-pay | Admitting: Gastroenterology

## 2018-10-29 ENCOUNTER — Other Ambulatory Visit: Payer: Self-pay

## 2018-10-29 ENCOUNTER — Encounter (HOSPITAL_COMMUNITY): Payer: Self-pay

## 2018-10-29 ENCOUNTER — Ambulatory Visit (HOSPITAL_COMMUNITY): Payer: BLUE CROSS/BLUE SHIELD | Admitting: Anesthesiology

## 2018-10-29 ENCOUNTER — Ambulatory Visit (HOSPITAL_COMMUNITY)
Admission: RE | Admit: 2018-10-29 | Discharge: 2018-10-29 | Disposition: A | Discharge status: Home / Self Care | Payer: BLUE CROSS/BLUE SHIELD | Attending: Gastroenterology | Admitting: Gastroenterology

## 2018-10-29 ENCOUNTER — Encounter (HOSPITAL_COMMUNITY)
Admission: RE | Disposition: A | Discharge status: Home / Self Care | Payer: Self-pay | Source: Home / Self Care | Attending: Gastroenterology

## 2018-10-29 ENCOUNTER — Other Ambulatory Visit: Payer: Self-pay | Admitting: Gastroenterology

## 2018-10-29 DIAGNOSIS — K219 Gastro-esophageal reflux disease without esophagitis: Secondary | ICD-10-CM | POA: Insufficient documentation

## 2018-10-29 DIAGNOSIS — E119 Type 2 diabetes mellitus without complications: Secondary | ICD-10-CM | POA: Diagnosis not present

## 2018-10-29 DIAGNOSIS — C252 Malignant neoplasm of tail of pancreas: Secondary | ICD-10-CM | POA: Diagnosis not present

## 2018-10-29 DIAGNOSIS — I10 Essential (primary) hypertension: Secondary | ICD-10-CM | POA: Diagnosis not present

## 2018-10-29 DIAGNOSIS — K869 Disease of pancreas, unspecified: Secondary | ICD-10-CM | POA: Diagnosis present

## 2018-10-29 DIAGNOSIS — Z7982 Long term (current) use of aspirin: Secondary | ICD-10-CM | POA: Diagnosis not present

## 2018-10-29 DIAGNOSIS — Z87891 Personal history of nicotine dependence: Secondary | ICD-10-CM | POA: Diagnosis not present

## 2018-10-29 DIAGNOSIS — Z79899 Other long term (current) drug therapy: Secondary | ICD-10-CM | POA: Insufficient documentation

## 2018-10-29 DIAGNOSIS — Z7984 Long term (current) use of oral hypoglycemic drugs: Secondary | ICD-10-CM | POA: Insufficient documentation

## 2018-10-29 HISTORY — PX: ESOPHAGOGASTRODUODENOSCOPY (EGD) WITH PROPOFOL: SHX5813

## 2018-10-29 HISTORY — PX: FINE NEEDLE ASPIRATION: SHX5430

## 2018-10-29 HISTORY — PX: EUS: SHX5427

## 2018-10-29 LAB — GLUCOSE, CAPILLARY: Glucose-Capillary: 153 mg/dL — ABNORMAL HIGH (ref 70–99)

## 2018-10-29 SURGERY — ULTRASOUND, UPPER GI TRACT, ENDOSCOPIC
Anesthesia: Monitor Anesthesia Care

## 2018-10-29 MED ORDER — PROPOFOL 500 MG/50ML IV EMUL
INTRAVENOUS | Status: DC | PRN
  Administered 2018-10-29: 150 ug/kg/min via INTRAVENOUS

## 2018-10-29 MED ORDER — LACTATED RINGERS IV SOLN
INTRAVENOUS | Status: DC
  Administered 2018-10-29: 09:00:00 via INTRAVENOUS

## 2018-10-29 MED ORDER — SODIUM CHLORIDE 0.9 % IV SOLN: INTRAVENOUS | Status: DC

## 2018-10-29 MED ORDER — PHENYLEPHRINE 40 MCG/ML (10ML) SYRINGE FOR IV PUSH (FOR BLOOD PRESSURE SUPPORT)
PREFILLED_SYRINGE | INTRAVENOUS | Status: DC | PRN
  Administered 2018-10-29 (×8): 80 ug via INTRAVENOUS

## 2018-10-29 MED ORDER — MEPERIDINE HCL 25 MG/ML IJ SOLN: 6.2500 mg | INTRAMUSCULAR | Status: DC | PRN

## 2018-10-29 MED ORDER — PROPOFOL 10 MG/ML IV BOLUS
INTRAVENOUS | Status: AC
  Filled 2018-10-29: qty 20

## 2018-10-29 MED ORDER — MIDAZOLAM HCL 2 MG/2ML IJ SOLN: 0.5000 mg | Freq: Once | INTRAMUSCULAR | Status: DC | PRN

## 2018-10-29 MED ORDER — PROMETHAZINE HCL 25 MG/ML IJ SOLN: 6.2500 mg | INTRAMUSCULAR | Status: DC | PRN

## 2018-10-29 MED ORDER — PROPOFOL 10 MG/ML IV BOLUS
INTRAVENOUS | Status: DC | PRN
  Administered 2018-10-29: 20 mg via INTRAVENOUS

## 2018-10-29 MED ORDER — LIDOCAINE 2% (20 MG/ML) 5 ML SYRINGE
INTRAMUSCULAR | Status: DC | PRN
  Administered 2018-10-29: 80 mg via INTRAVENOUS

## 2018-10-29 NOTE — Anesthesia Procedure Notes (Signed)
Procedure Name: MAC Date/Time: 10/29/2018 10:37 AM Performed by: West Pugh, CRNA Pre-anesthesia Checklist: Patient identified, Emergency Drugs available, Suction available, Patient being monitored and Timeout performed Patient Re-evaluated:Patient Re-evaluated prior to induction Oxygen Delivery Method: Nasal cannula Preoxygenation: Pre-oxygenation with 100% oxygen Induction Type: IV induction Placement Confirmation: positive ETCO2 Dental Injury: Teeth and Oropharynx as per pre-operative assessment

## 2018-10-29 NOTE — Op Note (Signed)
[facility] Patient Name: Vincent [name] Procedure Date: 10/29/2018 MRN: [id number] Attending MD: Arta Silence , MD Date of Birth: [birth year] CSN: [id number] Age: 68 Admit Type: Outpatient Procedure:                Upper EUS Indications:              Suspected mass in pancreas on MRI, Weight loss,                            Epigastric abdominal pain Providers:                Arta Silence, MD, Cleda Daub, RN, Cullin Dishman Dalton, Technician Referring MD:             Dr. Claris Gower Medicines:                Monitored Anesthesia Care Complications:            No immediate complications. Estimated Blood Loss:     Estimated blood loss: none. Procedure:                Pre-Anesthesia Assessment:                           - Prior to the procedure, a History and Physical                            was performed, and patient medications and                            allergies were reviewed. The patient's tolerance of                            previous anesthesia was also reviewed. The risks                            and benefits of the procedure and the sedation                            options and risks were discussed with the patient.                            All questions were answered, and informed consent                            was obtained. Prior Anticoagulants: The patient has                            taken no previous anticoagulant or antiplatelet                            agents. ASA Grade Assessment: II - A patient with  mild systemic disease. After reviewing the risks                            and benefits, the patient was deemed in                            satisfactory condition to undergo the procedure.                           After obtaining informed consent, the endoscope was                            passed under direct vision. Throughout the                            procedure, the  patient's blood pressure, pulse, and                            oxygen saturations were monitored continuously. The                            [id number] ([id number]) Olympus Linear EUS was                            introduced through the mouth, and advanced to the                            antrum of the stomach. The upper EUS was                            accomplished without difficulty. The patient                            tolerated the procedure well. Scope In: Scope Out: Findings:      ENDOSONOGRAPHIC FINDING: :      There was no sign of significant endosonographic abnormality in the left       lobe of the liver. Homogeneous parenchyma was identified.      There was no sign of significant endosonographic abnormality in the       gallbladder.      Many abnormal lymph nodes were visualized in the splenic region (level       19), perigastric region and peripancreatic region. The nodes were oval,       hypoechoic and had well defined margins.      An irregular mass was identified in the pancreatic tail. The mass was       hypoechoic and heterogenous. The mass measured 50 mm by 30 mm, in       aggregate (difficult to achieve entire specimen, due to angular three       dimensionality, in one plane) in maximal cross-sectional diameter. The       endosonographic borders were poorly-defined. There was sonographic       evidence suggesting invasion into the celiac trunk (manifested by       invasion) and the splenic vein (manifested by invasion). Fine needle       aspiration  for cytology was performed. Color Doppler imaging was       utilized prior to needle puncture to confirm a lack of significant       vascular structures within the needle path. Four passes were made with       the 25 gauge needle using a transgastric approach. A stylet was used. A       cytotechnologist was present to evaluate the adequacy of the specimen.       The cellularity of the specimen was adequate. Final  cytology results are       pending. Impression:               - There was no evidence of significant pathology in                            the left lobe of the liver.                           - There was no sign of significant pathology in the                            gallbladder.                           - Many abnormal lymph nodes were visualized in the                            splenic region (level 19), perigastric region and                            peripancreatic region.                           - A mass was identified in the pancreatic tail.                            This was staged T4 N2 Mx by endosonographic                            criteria. Fine needle aspiration performed. Moderate Sedation:      Not Applicable - Patient had care per Anesthesia. Recommendation:           - Discharge patient to home (via wheelchair).                           - Soft diet today.                           - Continue present medications.                           - Await cytology results.                           - Refer to an oncologist at appointment to be  scheduled.                           - Return to GI clinic PRN. Procedure Code(s):        --- Professional ---                           [phone], Esophagogastroduodenoscopy, flexible,                            transoral; with transendoscopic ultrasound-guided                            intramural or transmural fine needle                            aspiration/biopsy(s) (includes endoscopic                            ultrasound examination of the esophagus, stomach,                            and either the duodenum or a surgically altered                            stomach where the jejunum is examined distal to the                            anastomosis) Diagnosis Code(s):        --- Professional ---                           I89.9, Noninfective disorder of lymphatic vessels                             and lymph nodes, unspecified                           K86.89, Other specified diseases of pancreas                           R63.4, Abnormal weight loss                           R10.13, Epigastric pain                           R93.3, Abnormal findings on diagnostic imaging of                            other parts of digestive tract CPT copyright 2018 [employer]. All rights reserved. The codes documented in this report are preliminary and upon coder review may  be revised to meet current compliance requirements. Arta Silence, MD 10/29/2018 11:37:09 AM This report has been signed electronically. Number of Addenda: 0

## 2018-10-29 NOTE — Anesthesia Preprocedure Evaluation (Addendum)
Anesthesia Evaluation  Patient identified by MRN, date of birth, ID band Patient awake    Reviewed: Allergy & Precautions, NPO status , Patient's Chart, lab work & pertinent test results  History of Anesthesia Complications Negative for: history of anesthetic complications  Airway Mallampati: II  TM Distance: >3 FB Neck ROM: Full    Dental  (+) Dental Advisory Given   Pulmonary neg pulmonary ROS,    breath sounds clear to auscultation       Cardiovascular hypertension, Pt. on medications  Rhythm:Regular Rate:Normal     Neuro/Psych negative neurological ROS     GI/Hepatic Neg liver ROS, GERD  Medicated,  Endo/Other  diabetes, Oral Hypoglycemic Agents  Renal/GU negative Renal ROS     Musculoskeletal   Abdominal   Peds  Hematology   Anesthesia Other Findings   Reproductive/Obstetrics                            Anesthesia Physical Anesthesia Plan  ASA: II  Anesthesia Plan: MAC   Post-op Pain Management:    Induction:   PONV Risk Score and Plan: 1 and Treatment may vary due to age or medical condition  Airway Management Planned: Natural Airway and Nasal Cannula  Additional Equipment:   Intra-op Plan:   Post-operative Plan:   Informed Consent: I have reviewed the patients History and Physical, chart, labs and discussed the procedure including the risks, benefits and alternatives for the proposed anesthesia with the patient or authorized representative who has indicated his/her understanding and acceptance.     Dental advisory given  Plan Discussed with: CRNA and Surgeon  Anesthesia Plan Comments:         Anesthesia Quick Evaluation

## 2018-10-29 NOTE — Anesthesia Postprocedure Evaluation (Signed)
Anesthesia Post Note  Patient: Stewart Sasaki  Procedure(s) Performed: FULL UPPER ENDOSCOPIC ULTRASOUND (EUS) RADIAL (N/A ) FINE NEEDLE ASPIRATION (FNA) LINEAR (N/A )     Patient location during evaluation: Endoscopy Anesthesia Type: MAC Level of consciousness: awake and alert, oriented and patient cooperative Pain management: pain level controlled Vital Signs Assessment: post-procedure vital signs reviewed and stable Respiratory status: spontaneous breathing, nonlabored ventilation and respiratory function stable Cardiovascular status: blood pressure returned to baseline and stable Postop Assessment: no apparent nausea or vomiting Anesthetic complications: no    Last Vitals:  Vitals:   10/29/18 1129 10/29/18 1140  BP: 115/83 106/73  Pulse: (!) 102 96  Resp: 20 16  Temp: 36.5 C   SpO2: 94% 95%    Last Pain:  Vitals:   10/29/18 1140  TempSrc:   PainSc: 0-No pain                 Rynell Ciotti,E. Judye Lorino

## 2018-10-29 NOTE — Transfer of Care (Signed)
Immediate Anesthesia Transfer of Care Note  Patient: Greogory Cornette  Procedure(s) Performed: FULL UPPER ENDOSCOPIC ULTRASOUND (EUS) RADIAL (N/A ) FINE NEEDLE ASPIRATION (FNA) LINEAR (N/A )  Patient Location: PACU  Anesthesia Type:MAC  Level of Consciousness: awake, alert  and oriented  Airway & Oxygen Therapy: Patient Spontanous Breathing and Patient connected to nasal cannula oxygen  Post-op Assessment: Report given to RN and Post -op Vital signs reviewed and stable  Post vital signs: Reviewed and stable  Last Vitals:  Vitals Value Taken Time  BP    Temp    Pulse    Resp    SpO2      Last Pain:  Vitals:   10/29/18 0910  TempSrc: Oral  PainSc: 4          Complications: No apparent anesthesia complications

## 2018-10-29 NOTE — Discharge Instructions (Signed)

## 2018-10-29 NOTE — H&P (Signed)
Patient interval history reviewed.  Patient examined again.  There has been no change from documented H/P (scanned into chart from our office) except as documented above.  Assessment:  1.  Upper abdominal pain. 2.  Worsening glycemic control. 3.  Suspected mass in tail of pancreas on imaging.  Plan:  1.  Upper endoscopic ultrasound with possible fine needle aspiration (EUS +/- FNA). 2.  Risks (bleeding, infection, bowel perforation that could require surgery, sedation-related changes in cardiopulmonary systems), benefits (identification and possible treatment of source of symptoms, exclusion of certain causes of symptoms), and alternatives (watchful waiting, radiographic imaging studies, empiric medical treatment) of upper endoscopy with ultrasound and possible fine needle aspiration (EUS +/- FNA) were explained to patient/family in detail and patient wishes to proceed.

## 2018-11-01 ENCOUNTER — Telehealth: Payer: Self-pay

## 2018-11-01 ENCOUNTER — Telehealth: Payer: Self-pay | Admitting: *Deleted

## 2018-11-01 NOTE — Telephone Encounter (Signed)
Called patient to advise of appointment  On 11/05/18 @ 11 AM. Patient understands that no visitors are allowed back with patient but he can have wife on speaker phone. Patient agreed to arrive 30 minutes early to register. He has my contact information for questions or concerns.

## 2018-11-01 NOTE — Telephone Encounter (Signed)
Called patient to discuss referral. He and I had spoken a week ago. He was waiting on upper EUS scheduled on 10/29/18 with Dr. Paulita Fujita. He has already brought disc with MRI from [facility] to me and it is loaded into PACS. Message sent to Dr. Burr Medico and Isaiah Blakes to schedule appointment on 11/05/18 with Dr. Burr Medico. Will call patient back once scheduled.

## 2018-11-01 NOTE — Telephone Encounter (Signed)
Patient left VM requesting to speak with someone regarding his referral here from Dr. Paulita Fujita. He was told to call if he had not heard from [place] by 12:00 today. Notified GI Nurse Navigator, Dawn to contact patient.

## 2018-11-04 DIAGNOSIS — C259 Malignant neoplasm of pancreas, unspecified: Secondary | ICD-10-CM | POA: Insufficient documentation

## 2018-11-04 NOTE — Progress Notes (Addendum)
Vincent [name]   [phone] Fax:([phone]) [place] Note   Patient Care Team: Vincent Downing, MD as PCP - General (Family Medicine)  Date of Service:  11/05/2018   CHIEF COMPLAINTS/PURPOSE OF CONSULTATION:  Newly Diagnosed Pancreatic Cancer  REFERRING PHYSICIAN:  Dr. Paulita [name]     Pancreatic cancer [facility])   10/09/2018 Procedure    Upper Endoscopy by. Dr Vincent [name] 10/29/18 IMPRESSION - There was no evidence of significant pathology in the left lobe of the liver. - There was no sign of significant pathology in the gallbladder. - Many abnormal lymph nodes were visualized in the splenic region (level 19), perigastric region and peripancreatic region. - A mass was identified in the pancreatic tail. This was staged T4 N2 Mx by endosonographic criteria. Fine needle aspiration performed.    10/10/2018 Imaging    CT Abdomen 10/10/18 IMPRESSION: 1. Uncomplicated acute diverticulitis of the proximal sigmoid colon. 2. Ill-defined low-density in the pancreatic tail measuring at least 4.7 cm, with possible punctate peripheral calcifications. This is suspicious for pancreatic neoplasm, however sequela of prior pancreatitis is also considered. Recommend further characterization with pancreatic protocol MRI when patient is able to tolerate breath hold technique. 3. 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 4. 16 mm cyst in the left lobe of the liver. Aortic Atherosclerosis (ICD10-I70.0).    10/16/2018 Imaging    MRI Abdomen at [facility] 10/16/18 IMPRESSION: 1. Mass in the tail the pancreas concerning for neoplasm. This contacts the spleen in the splenic hilum. 2. There is stranding in the fat extending from the tumor to the celiac axis and left adrenal gland. Cannot exclude infiltrating tumor. 3. 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 4. metastatic lesion in the liver    10/29/2018 Initial Biopsy    Diagnosis 10/29/18 FINE NEEDLE ASPIRATION, ENDOSCOPIC, PANCREAS TAIL(SPECIMEN 1 OF 1 COLLECTED 10/29/18): MALIGNANT CELLS PRESENT, CONSISTENT WITH ADENOCARCINOMA. SEE COMMENT. COMMENT: THERE IS INSUFFICIENT TUMOR PRESENT FOR ADDITIONAL STUDIES.    11/04/2018 Initial Diagnosis    Pancreatic cancer ([place])      HISTORY OF PRESENTING ILLNESS:  Vincent [name] 68 y.o. male is a here because of new diagnosed pancreatic cancer. The patient was referred by Dr. Paulita [name]. The patient presents to the clinic today by himself. I called his wife, Vincent [name], to be included in the visit today.   He notes he has pain from hunger, his back and upper abdominal pain. He notes eating exacerbates his pain so he has lost about 40 pounds.  He presented to the ED on 10/09/18 for epigastric pain. He underwent upper endoscopy on 10/29/18 which shows pancreatic adenocarcinoma.  Today the patient notes  With lower appetite and weight loss. He is taking about 6 tabs of Tramadol for his pain   Socially he is married. He has 4 children who live in [place]. He drinks and smokes cigars rarely. 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  They have a PMHx of recent hyperglycemia/DM. On Metformin. He had HTN on lisinopril which has lately well controlled. He has Lumbar back surgery for ruptured disk in 10/2017. I reviewed his medication list with him. He notes his mother had ovarian cancer in her 70s.   REVIEW OF SYSTEMS:    Constitutional: Denies fevers, chills or abnormal night sweats (+) Lower appetite and weight loss  Eyes: Denies blurriness of vision, double vision or watery eyes Ears, nose, mouth, throat,  and face: Denies mucositis or sore throat Respiratory: Denies cough, dyspnea or wheezes Cardiovascular: Denies palpitation, chest discomfort or lower extremity swelling Gastrointestinal:  Denies heartburn or change in bowel habits (+) Epigastric pain (+) mild nausea Skin: Denies  abnormal skin rashes Lymphatics: Denies new lymphadenopathy or easy bruising Neurological:Denies numbness, tingling or new weaknesses Behavioral/Psych: Mood is stable, no new changes  All other systems were reviewed with the patient and are negative.  MEDICAL HISTORY:  Past Medical History:  Diagnosis Date   Diabetes mellitus without complication ([place])    pt. states that he is not diabeticalthought he takes metformin   Eye hemorrhage    right   Hypertension     SURGICAL HISTORY: Past Surgical History:  Procedure Laterality Date   ESOPHAGOGASTRODUODENOSCOPY (EGD) WITH PROPOFOL N/A 10/29/2018   Procedure: ESOPHAGOGASTRODUODENOSCOPY (EGD) WITH PROPOFOL;  Surgeon: Arta Silence, MD;  Location: [facility];  Service: Endoscopy;  Laterality: N/A;   EUS N/A 10/29/2018   Procedure: FULL UPPER ENDOSCOPIC ULTRASOUND (EUS) RADIAL;  Surgeon: Arta Silence, MD;  Location: [facility];  Service: Endoscopy;  Laterality: N/A;   FINE NEEDLE ASPIRATION N/A 10/29/2018   Procedure: FINE NEEDLE ASPIRATION (FNA) LINEAR;  Surgeon: Arta Silence, MD;  Location: [facility];  Service: Endoscopy;  Laterality: N/A;   LUMBAR LAMINECTOMY/DECOMPRESSION MICRODISCECTOMY Left 10/11/2017   Procedure: Microlumbar decompression Lumbar Four-Five Left;  Surgeon: Susa Day, MD;  Location: [place];  Service: Orthopedics;  Laterality: Left;  Microlumbar decompression Lumbar Four-Five Left   removal of wisdom teeth  1977    SOCIAL HISTORY: Social History   Socioeconomic History   Marital status: Married    Spouse name: Not on file   Number of children: 4   Years of education: Not on file   Highest education level: Not on file  Occupational History   Not on file  Social Needs   Financial resource strain: Not on file   Food insecurity:    Worry: Not on file    Inability: Not on file   Transportation needs:    Medical: Not on file    Non-medical: Not on file  Tobacco Use   Smoking  status: Never Smoker   Smokeless tobacco: Never Used  Substance and Sexual Activity   Alcohol use: Yes    Comment: rarely   Drug use: No   Sexual activity: Not on file  Lifestyle   Physical activity:    Days per week: Not on file    Minutes per session: Not on file   Stress: Not on file  Relationships   Social connections:    Talks on phone: Not on file    Gets together: Not on file    Attends religious service: Not on file    Active member of club or organization: Not on file    Attends meetings of clubs or organizations: Not on file    Relationship status: Not on file   Intimate partner violence:    Fear of current or ex partner: Not on file    Emotionally abused: Not on file    Physically abused: Not on file    Forced sexual activity: Not on file  Other Topics Concern   Not on file  Social History Narrative   Not on file    FAMILY HISTORY: Family History  Problem Relation Age of Onset   Cancer Mother 31       ovarian cancer     ALLERGIES:  has No Known Allergies.  MEDICATIONS:  Current  Outpatient Medications  Medication Sig Dispense Refill   Accu-Chek FastClix Lancets MISC daily. for testing     aspirin EC 81 MG tablet Take 1 tablet (81 mg total) by mouth daily after breakfast. Resume 4 days post-op     lisinopril (PRINIVIL,ZESTRIL) 10 MG tablet Take 10 mg by mouth daily after breakfast.     metFORMIN (GLUCOPHAGE) 1000 MG tablet TAKE 1 TABLET BY MOUTH AT BREAKFAST AND 1/2 TABLET AT SUPPER     metroNIDAZOLE (FLAGYL) 500 MG tablet Take 1 tablet (500 mg total) by mouth 3 (three) times daily. 3 tablet 0   pantoprazole (PROTONIX) 40 MG tablet Take 1 tablet (40 mg total) by mouth daily. 30 tablet 0   simvastatin (ZOCOR) 40 MG tablet Take 20 mg by mouth every evening.     traMADol (ULTRAM) 50 MG tablet TAKE 1 2 TABLETS EVERY 4 HOURS AS NEEDED FOR PAIN  MAX 8/24HRS     No current facility-administered medications for this visit.     PHYSICAL  EXAMINATION: ECOG PERFORMANCE STATUS: 1 - Symptomatic but completely ambulatory  Vitals:   11/05/18 1040  BP: 109/79  Pulse: (!) 107  Resp: 17  Temp: 98.2 F (36.8 C)  SpO2: 97%   Filed Weights   11/05/18 1040  Weight: 244 lb (110.7 kg)    GENERAL:alert, no distress and comfortable SKIN: skin color, texture, turgor are normal, no rashes or significant lesions EYES: normal, conjunctiva are pink and non-injected, sclera clear OROPHARYNX:no exudate, no erythema and lips, buccal mucosa, and tongue normal  NECK: supple, thyroid normal size, non-tender, without nodularity LYMPH:  no palpable lymphadenopathy in the cervical, axillary or inguinal LUNGS: clear to auscultation and percussion with normal breathing effort HEART: regular rate & rhythm and no murmurs and no lower extremity edema ABDOMEN:abdomen soft, non-tender and normal bowel sounds (+) No hepatomegaly  Musculoskeletal:no cyanosis of digits and no clubbing  PSYCH: alert & oriented x 3 with fluent speech NEURO: no focal motor/sensory deficits  LABORATORY DATA:  I have reviewed the data as listed CBC Latest Ref Rng & Units 10/09/2018 10/12/2017 10/08/2017  WBC 4.0 - 10.5 K/uL 9.1 7.8 5.6  Hemoglobin 13.0 - 17.0 g/dL 15.4 13.5 15.7  Hematocrit 39.0 - 52.0 % 46.4 40.0 45.5  Platelets 150 - 400 K/uL 172 151 163    CMP Latest Ref Rng & Units 10/09/2018 10/12/2017 10/08/2017  Glucose 70 - 99 mg/dL 131(H) 133(H) 119(H)  BUN 8 - 23 mg/dL 28(H) 13 17  Creatinine 0.61 - 1.24 mg/dL 1.06 1.06 1.16  Sodium 135 - 145 mmol/L 138 139 140  Potassium 3.5 - 5.1 mmol/L 4.3 4.0 4.5  Chloride 98 - 111 mmol/L 106 107 107  CO2 22 - 32 mmol/L 20(L) 24 25  Calcium 8.9 - 10.3 mg/dL 9.8 8.7(L) 9.3  Total Protein 6.5 - 8.1 g/dL 7.3 - -  Total Bilirubin 0.3 - 1.2 mg/dL 0.7 - -  Alkaline Phos 38 - 126 U/L 60 - -  AST 15 - 41 U/L 18 - -  ALT 0 - 44 U/L 26 - -    RADIOGRAPHIC STUDIES: I have personally reviewed the radiological images as listed and  agreed with the findings in the report. Ct Abdomen Pelvis W Contrast  Result Date: 10/10/2018 CLINICAL DATA:  Abd pain, diverticulitis suspected. Technologist notes state recurrent epigastric pain and decreased appetite 1 month. EXAM: CT ABDOMEN AND PELVIS WITH CONTRAST TECHNIQUE: Multidetector CT imaging of the abdomen and pelvis was performed using the standard protocol  following bolus administration of intravenous contrast. CONTRAST:  119m OMNIPAQUE IOHEXOL 300 MG/ML  SOLN COMPARISON:  None. FINDINGS: Lower chest: The lung bases are clear. Hepatobiliary: 16 mm water density lesion in the left lobe of the liver most consistent with cyst. Gallbladder physiologically distended, no calcified stone. No biliary dilatation. Pancreas: Ill-defined low-density in the pancreatic tail measures at least 4.7 cm in length, with possible punctate calcifications peripherally. Mild associated fat stranding. No discrete peripancreatic fluid collection. Spleen: Spleen is normal in size. No focal parenchymal abnormality. Main splenic vein is not confidently visualized, there are multiple collateral vessels at the splenic hilum, findings suggestive of splenic vein occlusion. Adrenals/Urinary Tract: No adrenal nodule. No hydronephrosis or perinephric edema. Homogeneous renal enhancement with symmetric excretion on delayed phase imaging. Urinary bladder is physiologically distended without wall thickening. Stomach/Bowel: Inflamed diverticulum in the proximal sigmoid colon with pericolonic fat stranding no perforation or abscess. Multiple additional noninflamed diverticula throughout the colon, most prominent distally. Normal appendix small bowel is unremarkable. Stomach is nondistended and not well assessed. Vascular/Lymphatic: Findings suspicious for splenic vein occlusion as described. Portal vein and mesenteric vessels are patent. Mild aortic atherosclerosis without aneurysm. No definite enlarged lymph nodes, with special  attention to the peripancreatic region. Reproductive: Prostate is unremarkable. Other: No free air, free fluid, or intra-abdominal fluid collection. Small fat containing umbilical hernia. Minimal fat in the inguinal canals. Musculoskeletal: There are no acute or suspicious osseous abnormalities. Degenerative change in the spine and pubic symphysis. IMPRESSION: 1. Uncomplicated acute diverticulitis of the proximal sigmoid colon. 2. Ill-defined low-density in the pancreatic tail measuring at least 4.7 cm, with possible punctate peripheral calcifications. This is suspicious for pancreatic neoplasm, however sequela of prior pancreatitis is also considered. Recommend further characterization with pancreatic protocol MRI when patient is able to tolerate breath hold technique. 3. 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 4. 16 mm cyst in the left lobe of the liver. Aortic Atherosclerosis (ICD10-I70.0). Electronically Signed   By: MKeith RakeM.D.   On: 10/10/2018 01:35   Mr [employer] Spine  Result Date: 10/24/2018 This examination belongs to an outside facility and is stored here for comparison purposes only.  Contact the originating outside institution for any associated report or interpretation.   ASSESSMENT & PLAN:  LKeinan Brouilletis a 68y.o. caucasian male with a history of DM, HTN.    1. 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  -His biopsy of the pancreatic tail mass shows adenocarcinoma. I discussed this is the most common type of pancreatic cancer.  -I discussed both his CT AP and abdominal MRI showed a 1.6cm lesion in the dorm, also it appears likely a benign cyst on the CT scan, however the MRI features are concerning for liver metastasis.  -I recommend PET scan to  complete staging and rule out distant metastasis, especially further evaluation of liver lesion. I also discussed Liver biopsy to get more definitive diagnosis if feasible. He is agreeable.  -I will discuss his case with GI Tumor Board tomorrow to determine if liver biopsy is feasible.  -Standard treatment of non-metastatic pancreatic cancer includes neo-adjuvant or adjuvant chemo and surgery. If he is found to have metastatic cancer, his disease would no longer be curable by surgical resection and palliative chemo would be utilized to prolong  his life. If not metastasized, he would likely proceed with neo-adjuvant chemo before surgery.  -Given his need for chemotherapy, I recommend PAC placement and chemo education class. He is agreeable.  -Given his overall good health, his age, he would be a candidate for more intensive chemotherapy FOLFIRINOX, I discussed the benefit and potential side effects, he is interested. -Will get baseline CA 19.9 to monitor.  -I recommend genetic testing to determine if he has more treatment options available, such as PARP inhibitor. He is interested.  -I answered all their question to their understanding and satisfaction.  -Exam, today overall unremarkable.  -F/u next week after PET and possible biopsy   2. Epigastric pain  -secondary to pancreatic cancer  -Currently on Tramadol 1-2 tabs q4hours. He takes about 6 tabs a day. Pain mainly controlled currently.   3. Lower appetite, Weight loss  -Eating exacerbates his epigastric pain and nausea -He has lost 40 pounds recently  -I advised him to start nutritional supplement such as Ensure or Boost to increase his calorie and protein intake. He is agreeable.  -I will have dietician contact him for consult.    4. DM, HTN -On Metformin and Lisinopril  -Continue to f/u with PCP    PLAN:  PET and PORT placement by IR next week Lab today for CA19.9 and genetic testing  Chemo class, dietician consult and f/u 4/10    Tumor board discussion, to determine liver biopsy   Orders Placed This Encounter  Procedures   NM PET Image Restag (PS) Skull Base To Thigh    Standing Status:   Future    Standing Expiration Date:   11/05/2019    Order Specific Question:   If indicated for the ordered procedure, I authorize the administration of a radiopharmaceutical per Radiology protocol    Answer:   Yes    Order Specific Question:   Preferred imaging location?    Answer:   [facility]    Order Specific Question:   Radiology Contrast Protocol - do NOT remove file path    Answer:   [email or web address]   IR Perc Tun Perit Cath W/Port    Standing Status:   Future    Standing Expiration Date:   01/05/2020    Order Specific Question:   Reason for exam:    Answer:   chemo    Order Specific Question:   Preferred Imaging Location?    Answer:   [facility]   CA 19.9    Standing Status:   Future    Number of Occurrences:   1    Standing Expiration Date:   11/05/2019   Genetic Screening    [employer] genetic kit    Standing Status:   Future    Standing Expiration Date:   11/05/2019    All questions were answered. The patient knows to call the clinic with any problems, questions or concerns. I spent 55 minutes counseling the patient face to face. The total time spent in the appointment was 60 minutes and more than 50% was on counseling.     Truitt Merle, MD 11/05/2018 3:40 PM  I, Joslyn Devon, am acting as scribe for Truitt Merle, MD.   I have reviewed the above documentation for accuracy and completeness, and I agree with the above.

## 2018-11-05 ENCOUNTER — Other Ambulatory Visit: Payer: Self-pay

## 2018-11-05 ENCOUNTER — Encounter: Payer: Self-pay | Admitting: Hematology

## 2018-11-05 ENCOUNTER — Inpatient Hospital Stay: Payer: BLUE CROSS/BLUE SHIELD | Attending: Hematology | Admitting: Hematology

## 2018-11-05 ENCOUNTER — Inpatient Hospital Stay: Payer: BLUE CROSS/BLUE SHIELD

## 2018-11-05 ENCOUNTER — Telehealth: Payer: Self-pay | Admitting: Hematology

## 2018-11-05 DIAGNOSIS — R630 Anorexia: Secondary | ICD-10-CM | POA: Diagnosis not present

## 2018-11-05 DIAGNOSIS — R634 Abnormal weight loss: Secondary | ICD-10-CM | POA: Diagnosis not present

## 2018-11-05 DIAGNOSIS — Z79899 Other long term (current) drug therapy: Secondary | ICD-10-CM

## 2018-11-05 DIAGNOSIS — E119 Type 2 diabetes mellitus without complications: Secondary | ICD-10-CM | POA: Diagnosis not present

## 2018-11-05 DIAGNOSIS — R1013 Epigastric pain: Secondary | ICD-10-CM | POA: Diagnosis not present

## 2018-11-05 DIAGNOSIS — I10 Essential (primary) hypertension: Secondary | ICD-10-CM | POA: Insufficient documentation

## 2018-11-05 DIAGNOSIS — D490 Neoplasm of unspecified behavior of digestive system: Secondary | ICD-10-CM

## 2018-11-05 DIAGNOSIS — K769 Liver disease, unspecified: Secondary | ICD-10-CM | POA: Insufficient documentation

## 2018-11-05 DIAGNOSIS — C252 Malignant neoplasm of tail of pancreas: Secondary | ICD-10-CM

## 2018-11-06 ENCOUNTER — Inpatient Hospital Stay: Payer: BLUE CROSS/BLUE SHIELD | Attending: Hematology

## 2018-11-06 ENCOUNTER — Telehealth: Payer: Self-pay | Admitting: Hematology

## 2018-11-06 ENCOUNTER — Other Ambulatory Visit: Payer: Self-pay | Admitting: Genetic Counselor

## 2018-11-06 ENCOUNTER — Other Ambulatory Visit: Payer: Self-pay

## 2018-11-06 ENCOUNTER — Telehealth: Payer: Self-pay

## 2018-11-06 DIAGNOSIS — Z5111 Encounter for antineoplastic chemotherapy: Secondary | ICD-10-CM | POA: Insufficient documentation

## 2018-11-06 DIAGNOSIS — R110 Nausea: Secondary | ICD-10-CM | POA: Insufficient documentation

## 2018-11-06 DIAGNOSIS — Z79899 Other long term (current) drug therapy: Secondary | ICD-10-CM | POA: Insufficient documentation

## 2018-11-06 DIAGNOSIS — C787 Secondary malignant neoplasm of liver and intrahepatic bile duct: Secondary | ICD-10-CM | POA: Insufficient documentation

## 2018-11-06 DIAGNOSIS — I10 Essential (primary) hypertension: Secondary | ICD-10-CM | POA: Insufficient documentation

## 2018-11-06 DIAGNOSIS — C252 Malignant neoplasm of tail of pancreas: Secondary | ICD-10-CM

## 2018-11-06 DIAGNOSIS — Z7984 Long term (current) use of oral hypoglycemic drugs: Secondary | ICD-10-CM | POA: Insufficient documentation

## 2018-11-06 DIAGNOSIS — R634 Abnormal weight loss: Secondary | ICD-10-CM | POA: Insufficient documentation

## 2018-11-06 DIAGNOSIS — E119 Type 2 diabetes mellitus without complications: Secondary | ICD-10-CM | POA: Insufficient documentation

## 2018-11-06 LAB — CANCER ANTIGEN 19-9: CA 19-9: 3683 U/mL — ABNORMAL HIGH (ref 0–35)

## 2018-11-06 NOTE — Telephone Encounter (Signed)
Called and scheduled appt per 3/31 los.  Patient aware of appts.  Gave patient the number to central radiology to schedule PET scan

## 2018-11-06 NOTE — Telephone Encounter (Signed)
Per 4/1 los

## 2018-11-06 NOTE — Telephone Encounter (Signed)
Called and spoke with patient's wife regarding appointment for PET scan, will be 11/13/2018 0800 to arrive at 0730 at [facility], NPO past midnight, no gum, no candy, avoid carbohydrates the day before.  Reviewed all of his appointments with her.  She verbalized an understanding.

## 2018-11-08 ENCOUNTER — Other Ambulatory Visit: Payer: Self-pay | Admitting: Physician Assistant

## 2018-11-11 ENCOUNTER — Encounter (HOSPITAL_COMMUNITY): Payer: Self-pay

## 2018-11-11 ENCOUNTER — Ambulatory Visit (HOSPITAL_COMMUNITY)
Admission: RE | Admit: 2018-11-11 | Discharge: 2018-11-11 | Disposition: A | Discharge status: Home / Self Care | Payer: BLUE CROSS/BLUE SHIELD | Source: Ambulatory Visit | Attending: Hematology | Admitting: Hematology

## 2018-11-11 ENCOUNTER — Telehealth: Payer: Self-pay | Admitting: Hematology

## 2018-11-11 ENCOUNTER — Other Ambulatory Visit: Payer: Self-pay | Admitting: Hematology

## 2018-11-11 ENCOUNTER — Other Ambulatory Visit: Payer: Self-pay

## 2018-11-11 DIAGNOSIS — E119 Type 2 diabetes mellitus without complications: Secondary | ICD-10-CM | POA: Insufficient documentation

## 2018-11-11 DIAGNOSIS — C252 Malignant neoplasm of tail of pancreas: Secondary | ICD-10-CM

## 2018-11-11 DIAGNOSIS — I10 Essential (primary) hypertension: Secondary | ICD-10-CM | POA: Diagnosis not present

## 2018-11-11 DIAGNOSIS — Z79899 Other long term (current) drug therapy: Secondary | ICD-10-CM | POA: Diagnosis not present

## 2018-11-11 DIAGNOSIS — C259 Malignant neoplasm of pancreas, unspecified: Secondary | ICD-10-CM | POA: Insufficient documentation

## 2018-11-11 DIAGNOSIS — Z7984 Long term (current) use of oral hypoglycemic drugs: Secondary | ICD-10-CM | POA: Diagnosis not present

## 2018-11-11 DIAGNOSIS — Z7982 Long term (current) use of aspirin: Secondary | ICD-10-CM | POA: Insufficient documentation

## 2018-11-11 HISTORY — PX: IR IMAGING GUIDED PORT INSERTION: IMG5740

## 2018-11-11 LAB — GLUCOSE, CAPILLARY: Glucose-Capillary: 233 mg/dL — ABNORMAL HIGH (ref 70–99)

## 2018-11-11 LAB — CBC
HCT: 44.9 % (ref 39.0–52.0)
Hemoglobin: 15.3 g/dL (ref 13.0–17.0)
MCH: 33.6 pg (ref 26.0–34.0)
MCHC: 34.1 g/dL (ref 30.0–36.0)
MCV: 98.5 fL (ref 80.0–100.0)
Platelets: 199 10*3/uL (ref 150–400)
RBC: 4.56 MIL/uL (ref 4.22–5.81)
RDW: 12 % (ref 11.5–15.5)
WBC: 8.1 10*3/uL (ref 4.0–10.5)
nRBC: 0 % (ref 0.0–0.2)

## 2018-11-11 LAB — PROTIME-INR
INR: 1.1 (ref 0.8–1.2)
Prothrombin Time: 13.7 seconds (ref 11.4–15.2)

## 2018-11-11 MED ORDER — MIDAZOLAM HCL 2 MG/2ML IJ SOLN
INTRAMUSCULAR | Status: AC | PRN
  Administered 2018-11-11 (×2): 1 mg via INTRAVENOUS

## 2018-11-11 MED ORDER — LIDOCAINE HCL 1 % IJ SOLN
INTRAMUSCULAR | Status: AC
  Filled 2018-11-11: qty 20

## 2018-11-11 MED ORDER — HEPARIN SOD (PORK) LOCK FLUSH 100 UNIT/ML IV SOLN
INTRAVENOUS | Status: AC | PRN
  Administered 2018-11-11: 500 [IU]

## 2018-11-11 MED ORDER — OXYCODONE HCL 5 MG PO TABS: 5.0000 mg | ORAL_TABLET | Freq: Four times a day (QID) | ORAL | 0 refills | Status: DC | PRN

## 2018-11-11 MED ORDER — HEPARIN SOD (PORK) LOCK FLUSH 100 UNIT/ML IV SOLN
INTRAVENOUS | Status: AC
  Filled 2018-11-11: qty 5

## 2018-11-11 MED ORDER — CEFAZOLIN SODIUM-DEXTROSE 2-4 GM/100ML-% IV SOLN
2.0000 g | Freq: Once | INTRAVENOUS | Status: AC
  Administered 2018-11-11: 2 g via INTRAVENOUS

## 2018-11-11 MED ORDER — FENTANYL CITRATE (PF) 100 MCG/2ML IJ SOLN
INTRAMUSCULAR | Status: AC
  Filled 2018-11-11: qty 4

## 2018-11-11 MED ORDER — MIDAZOLAM HCL 2 MG/2ML IJ SOLN
INTRAMUSCULAR | Status: AC
  Filled 2018-11-11: qty 4

## 2018-11-11 MED ORDER — SODIUM CHLORIDE 0.9 % IV SOLN
INTRAVENOUS | Status: DC
  Administered 2018-11-11 (×2): via INTRAVENOUS

## 2018-11-11 MED ORDER — CEFAZOLIN SODIUM-DEXTROSE 2-4 GM/100ML-% IV SOLN
INTRAVENOUS | Status: AC
  Filled 2018-11-11: qty 100

## 2018-11-11 MED ORDER — LIDOCAINE HCL 1 % IJ SOLN
INTRAMUSCULAR | Status: AC | PRN
  Administered 2018-11-11: 10 mL

## 2018-11-11 MED ORDER — FENTANYL CITRATE (PF) 100 MCG/2ML IJ SOLN
INTRAMUSCULAR | Status: AC | PRN
  Administered 2018-11-11 (×2): 50 ug via INTRAVENOUS

## 2018-11-11 MED ORDER — HYDROCODONE-ACETAMINOPHEN 5-325 MG PO TABS: 1.0000 | ORAL_TABLET | Freq: Four times a day (QID) | ORAL | 0 refills | Status: DC | PRN

## 2018-11-11 NOTE — Procedures (Signed)
Interventional Radiology Procedure Note  Procedure: Single Lumen Power Port Placement    Access:  Right IJ vein.  Findings: Catheter tip positioned at SVC/RA junction. Port is ready for immediate use.   Complications: None  EBL: < 10 mL  Recommendations:  - Ok to shower in 24 hours - Do not submerge for 7 days - Routine line care   Kaymon Denomme T. Akiba Melfi, M.D Pager:  [phone]   

## 2018-11-11 NOTE — Consult Note (Signed)
Chief Complaint: Patient was seen in consultation today for port a cath placement  Referring Physician(s): Feng,Yan  Supervising Physician: Aletta Edouard  Patient Status: [place]  History of Present Illness: Vincent [name] is a 68 y.o. male with history of newly diagnosed pancreatic carcinoma who presents today for Port-A-Cath placement for chemotherapy.  Past Medical History:  Diagnosis Date  . Diabetes mellitus without complication ([place])    pt. states that he is not diabeticalthought he takes metformin  . Eye hemorrhage    right  . Hypertension     Past Surgical History:  Procedure Laterality Date  . ESOPHAGOGASTRODUODENOSCOPY (EGD) WITH PROPOFOL N/A 10/29/2018   Procedure: ESOPHAGOGASTRODUODENOSCOPY (EGD) WITH PROPOFOL;  Surgeon: Arta Silence, MD;  Location: [facility];  Service: Endoscopy;  Laterality: N/A;  . EUS N/A 10/29/2018   Procedure: FULL UPPER ENDOSCOPIC ULTRASOUND (EUS) RADIAL;  Surgeon: Arta Silence, MD;  Location: [facility];  Service: Endoscopy;  Laterality: N/A;  . FINE NEEDLE ASPIRATION N/A 10/29/2018   Procedure: FINE NEEDLE ASPIRATION (FNA) LINEAR;  Surgeon: Arta Silence, MD;  Location: [facility];  Service: Endoscopy;  Laterality: N/A;  . LUMBAR LAMINECTOMY/DECOMPRESSION MICRODISCECTOMY Left 10/11/2017   Procedure: Microlumbar decompression Lumbar Four-Five Left;  Surgeon: Susa Day, MD;  Location: [facility];  Service: Orthopedics;  Laterality: Left;  Microlumbar decompression Lumbar Four-Five Left  . removal of wisdom teeth  1977    Allergies: Patient has no known allergies.  Medications: Prior to Admission medications   Medication Sig Start Date End Date Taking? Authorizing Provider  aspirin EC 81 MG tablet Take 1 tablet (81 mg total) by mouth daily after breakfast. Resume 4 days post-op 10/11/17  Yes Bissell, Jaclyn M, PA-C  lisinopril (PRINIVIL,ZESTRIL) 10 MG tablet Take 10 mg by mouth daily after breakfast.   Yes  [provider]  metFORMIN (GLUCOPHAGE) 1000 MG tablet TAKE 1 TABLET BY MOUTH AT BREAKFAST AND 1/2 TABLET AT SUPPER 10/18/18  Yes [provider]  oxyCODONE (OXY IR/ROXICODONE) 5 MG immediate release tablet Take 5-10 mg by mouth every 4 (four) hours as needed for severe pain.   Yes [provider]  simvastatin (ZOCOR) 40 MG tablet Take 20 mg by mouth every evening. 09/25/18  Yes [provider]  traMADol (ULTRAM) 50 MG tablet TAKE 1 2 TABLETS EVERY 4 HOURS AS NEEDED FOR PAIN  MAX 8/24HRS 10/22/18  Yes [provider]  Accu-Chek FastClix Lancets MISC daily. for testing 10/10/18   [provider]  metroNIDAZOLE (FLAGYL) 500 MG tablet Take 1 tablet (500 mg total) by mouth 3 (three) times daily. 09/13/04   Delora Fuel, MD  pantoprazole (PROTONIX) 40 MG tablet Take 1 tablet (40 mg total) by mouth daily. 09/09/74   Delora Fuel, MD     Family History  Problem Relation Age of Onset  . Cancer Mother 22       ovarian cancer     Social History   Socioeconomic History  . Marital status: Married    Spouse name: Not on file  . Number of children: 4  . Years of education: Not on file  . Highest education level: Not on file  Occupational History  . Not on file  Social Needs  . Financial resource strain: Not on file  . Food insecurity:    Worry: Not on file    Inability: Not on file  . Transportation needs:    Medical: Not on file    Non-medical: Not on file  Tobacco Use  .  Smoking status: Never Smoker  . Smokeless tobacco: Never Used  Substance and Sexual Activity  . Alcohol use: Yes    Comment: rarely  . Drug use: No  . Sexual activity: Not on file  Lifestyle  . Physical activity:    Days per week: Not on file    Minutes per session: Not on file  . Stress: Not on file  Relationships  . Social connections:    Talks on phone: Not on file    Gets together: Not on file    Attends religious service: Not on file    Active member of club or  organization: Not on file    Attends meetings of clubs or organizations: Not on file    Relationship status: Not on file  Other Topics Concern  . Not on file  Social History Narrative  . Not on file      Review of Systems denies fever, headache, chest pain, dyspnea, cough, back pain, vomiting or bleeding.  Does have some intermittent abdominal pain and occasional nausea.  Vital Signs: BP (!) 125/95   Pulse (!) 106   Temp 98.5 F (36.9 C) (Oral)   Resp 18   Ht 6\' 4"  (1.93 m)   Wt 244 lb (110.7 kg)   SpO2 94%   BMI 29.70 kg/m   Physical Exam awake, alert.  Chest clear to auscultation bilaterally.  Heart with slightly tachycardic but regular rhythm.  Abdomen soft, positive bowel sounds, some mild epigastric tenderness to palpation.  No lower extremity edema.  Imaging: [employer] Spine  Result Date: 10/24/2018 This examination belongs to an outside facility and is stored here for comparison purposes only.  Contact the originating outside institution for any associated report or interpretation.   Labs:  CBC: Recent Labs    10/09/18 1858 11/11/18 0954  WBC 9.1 8.1  HGB 15.4 15.3  HCT 46.4 44.9  PLT 172 199    COAGS: Recent Labs    11/11/18 0954  INR 1.1    BMP: Recent Labs    10/09/18 1858  NA 138  K 4.3  CL 106  CO2 20*  GLUCOSE 131*  BUN 28*  CALCIUM 9.8  CREATININE 1.06  GFRNONAA >60  GFRAA >60    LIVER FUNCTION TESTS: Recent Labs    10/09/18 1858  BILITOT 0.7  AST 18  ALT 26  ALKPHOS 60  PROT 7.3  ALBUMIN 4.0    TUMOR MARKERS: No results for input(s): AFPTM, CEA, CA199, CHROMGRNA in the last 8760 hours.  Assessment and Plan: 68 y.o. male with history of newly diagnosed pancreatic carcinoma who presents today for Port-A-Cath placement for chemotherapy.Risks and benefits of image guided port-a-catheter placement was discussed with the patient including, but not limited to bleeding, infection, pneumothorax, or fibrin sheath  development and need for additional procedures.  All of the patient's questions were answered, patient is agreeable to proceed. Consent signed and in chart.     Thank you for this interesting consult.  I greatly enjoyed meeting Vincent [name] and look forward to participating in their care.  A copy of this report was sent to the requesting provider on this date.  Electronically Signed: D. Rowe Robert, PA-C 11/11/2018, 11:34 AM   I spent a total of 25 minutes  in face to face in clinical consultation, greater than 50% of which was counseling/coordinating care for Port-A-Cath placement

## 2018-11-11 NOTE — Discharge Instructions (Signed)
Implanted Port Home Guide °An implanted port is a device that is placed under the skin. It is usually placed in the chest. The device can be used to give IV medicine, to take blood, or for dialysis. You may have an implanted port if: °· You need IV medicine that would be irritating to the small veins in your hands or arms. °· You need IV medicines, such as antibiotics, for a long period of time. °· You need IV nutrition for a long period of time. °· You need dialysis. °Having a port means that your health care provider will not need to use the veins in your arms for these procedures. You may have fewer limitations when using a port than you would if you used other types of long-term IVs, and you will likely be able to return to normal activities after your incision heals. °An implanted port has two main parts: °· Reservoir. The reservoir is the part where a needle is inserted to give medicines or draw blood. The reservoir is round. After it is placed, it appears as a small, raised area under your skin. °· Catheter. The catheter is a thin, flexible tube that connects the reservoir to a vein. Medicine that is inserted into the reservoir goes into the catheter and then into the vein. °How is my port accessed? °To access your port: °· A numbing cream may be placed on the skin over the port site. °· Your health care provider will put on a mask and sterile gloves. °· The skin over your port will be cleaned carefully with a germ-killing soap and allowed to dry. °· Your health care provider will gently pinch the port and insert a needle into it. °· Your health care provider will check for a blood return to make sure the port is in the vein and is not clogged. °· If your port needs to remain accessed to get medicine continuously (constant infusion), your health care provider will place a clear bandage (dressing) over the needle site. The dressing and needle will need to be changed every week, or as told by your health care  provider. °What is flushing? °Flushing helps keep the port from getting clogged. Follow instructions from your health care provider about how and when to flush the port. Ports are usually flushed with saline solution or a medicine called heparin. The need for flushing will depend on how the port is used: °· If the port is only used from time to time to give medicines or draw blood, the port may need to be flushed: °? Before and after medicines have been given. °? Before and after blood has been drawn. °? As part of routine maintenance. Flushing may be recommended every 4-6 weeks. °· If a constant infusion is running, the port may not need to be flushed. °· Throw away any syringes in a disposal container that is meant for sharp items (sharps container). You can buy a sharps container from a pharmacy, or you can make one by using an empty hard plastic bottle with a cover. °How long will my port stay implanted? °The port can stay in for as long as your health care provider thinks it is needed. When it is time for the port to come out, a surgery will be done to remove it. The surgery will be similar to the procedure that was done to put the port in. °Follow these instructions at home: ° °· Flush your port as told by your health care provider. °·   If you need an infusion over several days, follow instructions from your health care provider about how to take care of your port site. Make sure you: ? Wash your hands with soap and water before you change your dressing. If soap and water are not available, use alcohol-based hand sanitizer. ? Change your dressing as told by your health care provider. ? Place any used dressings or infusion bags into a plastic bag. Throw that bag in the trash. ? Keep the dressing that covers the needle clean and dry. Do not get it wet. ? Do not use scissors or sharp objects near the tube. ? Keep the tube clamped, unless it is being used.  Check your port site every day for signs of  infection. Check for: ? Redness, swelling, or pain. ? Fluid or blood. ? Pus or a bad smell.  Protect the skin around the port site. ? Avoid wearing bra straps that rub or irritate the site. ? Protect the skin around your port from seat belts. Place a soft pad over your chest if needed.  Bathe or shower as told by your health care provider. The site may get wet as long as you are not actively receiving an infusion.  Return to your normal activities as told by your health care provider. Ask your health care provider what activities are safe for you.  Carry a medical alert card or wear a medical alert bracelet at all times. This will let health care providers know that you have an implanted port in case of an emergency. Get help right away if:  You have redness, swelling, or pain at the port site.  You have fluid or blood coming from your port site.  You have pus or a bad smell coming from the port site.  You have a fever. Summary  Implanted ports are usually placed in the chest for long-term IV access.  Follow instructions from your health care provider about flushing the port and changing bandages (dressings).  Take care of the area around your port by avoiding clothing that puts pressure on the area, and by watching for signs of infection.  Protect the skin around your port from seat belts. Place a soft pad over your chest if needed.  Get help right away if you have a fever or you have redness, swelling, pain, drainage, or a bad smell at the port site. This information is not intended to replace advice given to you by your health care provider. Make sure you discuss any questions you have with your health care provider. Document Released: 07/24/2005 Document Revised: 08/26/2016 Document Reviewed: 08/26/2016 Elsevier Interactive Patient Education  2019 [place] Insertion, Care After This sheet gives you information about how to care for yourself after  your procedure. Your health care provider may also give you more specific instructions. If you have problems or questions, contact your health care provider. What can I expect after the procedure? After the procedure, it is common to have:  Discomfort at the port insertion site.  Bruising on the skin over the port. This should improve over 3-4 days. Follow these instructions at home: [facility] care  After your port is placed, you will get a manufacturer's information card. The card has information about your port. Keep this card with you at all times.  Take care of the port as told by your health care provider. Ask your health care provider if you or a family member can get training for  taking care of the port at home. A home health care nurse may also take care of the port.  Make sure to remember what type of port you have. Incision care      Follow instructions from your health care provider about how to take care of your port insertion site. Make sure you: ? Wash your hands with soap and water before and after you change your bandage (dressing). If soap and water are not available, use hand sanitizer. ? Change your dressing as told by your health care provider. ? Leave stitches (sutures), skin glue, or adhesive strips in place. These skin closures may need to stay in place for 2 weeks or longer. If adhesive strip edges start to loosen and curl up, you may trim the loose edges. Do not remove adhesive strips completely unless your health care provider tells you to do that.  Check your port insertion site every day for signs of infection. Check for: ? Redness, swelling, or pain. ? Fluid or blood. ? Warmth. ? Pus or a bad smell. Activity  Return to your normal activities as told by your health care provider. Ask your health care provider what activities are safe for you.  Do not lift anything that is heavier than 10 lb (4.5 kg), or the limit that you are told, until your health care  provider says that it is safe. General instructions  Take over-the-counter and prescription medicines only as told by your health care provider.  Do not get dressing wet. Keep clean and dry for 24hrs. Do not take baths, swim, or use a hot tub for 48hr.You may only be allowed to take sponge baths. You may take a shower in 24hrs.  Do not EMLA cream for 2 weeks.  Do not drive for 24 hours if you were given a sedative during your procedure.  Wear a medical alert bracelet in case of an emergency. This will tell any health care providers that you have a port.  Keep all follow-up visits as told by your health care provider. This is important. Contact a health care provider if:  You cannot flush your port with saline as directed, or you cannot draw blood from the port.  You have a fever or chills.  You have redness, swelling, or pain around your port insertion site.  You have fluid or blood coming from your port insertion site.  Your port insertion site feels warm to the touch.  You have pus or a bad smell coming from the port insertion site. Get help right away if:  You have chest pain or shortness of breath.  You have bleeding from your port that you cannot control. Summary  Take care of the port as told by your health care provider. Keep the manufacturer's information card with you at all times.  Change your dressing as told by your health care provider.  Contact a health care provider if you have a fever or chills or if you have redness, swelling, or pain around your port insertion site.  Keep all follow-up visits as told by your health care provider. This information is not intended to replace advice given to you by your health care provider. Make sure you discuss any questions you have with your health care provider. Document Released: 05/14/2013 Document Revised: 02/19/2018 Document Reviewed: 02/19/2018 Elsevier Interactive Patient Education  2019 [place].  Moderate  Conscious Sedation, Adult, Care After These instructions provide you with information about caring for yourself after your procedure. Your  health care provider may also give you more specific instructions. Your treatment has been planned according to current medical practices, but problems sometimes occur. Call your health care provider if you have any problems or questions after your procedure. What can I expect after the procedure? After your procedure, it is common:  To feel sleepy for several hours.  To feel clumsy and have poor balance for several hours.  To have poor judgment for several hours.  To vomit if you eat too soon. Follow these instructions at home: For at least 24 hours after the procedure:   Do not: ? Participate in activities where you could fall or become injured. ? Drive. ? Use heavy machinery. ? Drink alcohol. ? Take sleeping pills or medicines that cause drowsiness. ? Make important decisions or sign legal documents. ? Take care of children on your own.  Rest. Eating and drinking  Follow the diet recommended by your health care provider.  If you vomit: ? Drink water, juice, or soup when you can drink without vomiting. ? Make sure you have little or no nausea before eating solid foods. General instructions  Have a responsible adult stay with you until you are awake and alert.  Take over-the-counter and prescription medicines only as told by your health care provider.  If you smoke, do not smoke without supervision.  Keep all follow-up visits as told by your health care provider. This is important. Contact a health care provider if:  You keep feeling nauseous or you keep vomiting.  You feel light-headed.  You develop a rash.  You have a fever. Get help right away if:  You have trouble breathing. This information is not intended to replace advice given to you by your health care provider. Make sure you discuss any questions you have with your  health care provider. Document Released: 05/14/2013 Document Revised: 12/27/2015 Document Reviewed: 11/13/2015 Elsevier Interactive Patient Education  2019 [employer].

## 2018-11-11 NOTE — Telephone Encounter (Signed)
I spoke with pt's wife. Pt has been experiencing more abdominal pain lately, he takes Tramadol 2 tab every 4-6 hours, average 8 tabs daily, which help significantly. BM OK with stool softener. He is getting a port placed by IR today. No other concerns. I will call in Evan 5/325mg  every 6 hours PRN #15 for him, in addition to tramadol. He is scheduled to see me later this week.  Truitt Merle  11/11/2018

## 2018-11-13 ENCOUNTER — Encounter (HOSPITAL_COMMUNITY): Payer: BLUE CROSS/BLUE SHIELD

## 2018-11-14 ENCOUNTER — Telehealth: Payer: Self-pay | Admitting: *Deleted

## 2018-11-14 ENCOUNTER — Inpatient Hospital Stay (HOSPITAL_BASED_OUTPATIENT_CLINIC_OR_DEPARTMENT_OTHER): Payer: BLUE CROSS/BLUE SHIELD | Admitting: Hematology

## 2018-11-14 ENCOUNTER — Encounter: Payer: Self-pay | Admitting: Hematology

## 2018-11-14 ENCOUNTER — Inpatient Hospital Stay: Payer: BLUE CROSS/BLUE SHIELD

## 2018-11-14 ENCOUNTER — Other Ambulatory Visit: Payer: Self-pay

## 2018-11-14 DIAGNOSIS — E119 Type 2 diabetes mellitus without complications: Secondary | ICD-10-CM | POA: Diagnosis not present

## 2018-11-14 DIAGNOSIS — C252 Malignant neoplasm of tail of pancreas: Secondary | ICD-10-CM | POA: Diagnosis not present

## 2018-11-14 DIAGNOSIS — Z7984 Long term (current) use of oral hypoglycemic drugs: Secondary | ICD-10-CM

## 2018-11-14 DIAGNOSIS — Z7982 Long term (current) use of aspirin: Secondary | ICD-10-CM

## 2018-11-14 DIAGNOSIS — I10 Essential (primary) hypertension: Secondary | ICD-10-CM

## 2018-11-14 DIAGNOSIS — G893 Neoplasm related pain (acute) (chronic): Secondary | ICD-10-CM | POA: Diagnosis not present

## 2018-11-14 DIAGNOSIS — Z79899 Other long term (current) drug therapy: Secondary | ICD-10-CM

## 2018-11-14 DIAGNOSIS — Z7189 Other specified counseling: Secondary | ICD-10-CM

## 2018-11-14 DIAGNOSIS — Z794 Long term (current) use of insulin: Secondary | ICD-10-CM

## 2018-11-14 MED ORDER — PROCHLORPERAZINE MALEATE 10 MG PO TABS: 10.0000 mg | ORAL_TABLET | Freq: Four times a day (QID) | ORAL | 1 refills | Status: DC | PRN

## 2018-11-14 MED ORDER — LIDOCAINE-PRILOCAINE 2.5-2.5 % EX CREA: TOPICAL_CREAM | CUTANEOUS | 3 refills | Status: AC

## 2018-11-14 MED ORDER — ONDANSETRON HCL 8 MG PO TABS: 8.0000 mg | ORAL_TABLET | Freq: Two times a day (BID) | ORAL | 1 refills | Status: AC | PRN

## 2018-11-14 NOTE — Progress Notes (Signed)
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

## 2018-11-14 NOTE — Progress Notes (Signed)
[place]   [phone] [phone]   Clinic Follow up Note   Patient Care Team: Leonard Downing, MD as PCP - General (Family Medicine)   I connected with Vincent [name] on 11/14/2018 at  2:45 PM EDT by telephone visit and verified that I am speaking with the correct person using two identifiers.  I discussed the limitations, risks, security and privacy concerns of performing an evaluation and management service by telephone and the availability of in person appointments. I also discussed with the patient that there may be a patient responsible charge related to this service. The patient expressed understanding and agreed to proceed.   Other persons participating in the visit and their role in the encounter:  Vincent [name]   Patient's location:  Vincent home  Provider's location:  My Office   CHIEF COMPLAINT: F/u of Pancreatic Cancer   SUMMARY OF ONCOLOGIC HISTORY:   Pancreatic cancer ([place])   10/09/2018 Procedure    Upper Endoscopy by. Dr Paulita Fujita 10/29/18 IMPRESSION - There was no evidence of significant pathology in the left lobe of the liver. - There was no sign of significant pathology in the gallbladder. - Many abnormal lymph nodes were visualized in the splenic region (level 19), perigastric region and peripancreatic region. - A mass was identified in the pancreatic tail. This was staged T4 N2 Mx by endosonographic criteria. Fine needle aspiration performed.    10/10/2018 Imaging    CT Abdomen 10/10/18 IMPRESSION: 1. Uncomplicated acute diverticulitis of the proximal sigmoid colon. 2. Ill-defined low-density in the pancreatic tail measuring at least 4.7 cm, with possible punctate peripheral calcifications. This is suspicious for pancreatic neoplasm, however sequela of prior pancreatitis is also considered. Recommend further characterization with pancreatic protocol MRI when patient is able to tolerate breath hold technique. 3. 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 4. 16 mm cyst in the left lobe of the liver. Aortic Atherosclerosis (ICD10-I70.0).    10/16/2018 Imaging    MRI Abdomen at [facility] 10/16/18 IMPRESSION: 1. Mass in the tail the pancreas concerning for neoplasm. This contacts the spleen in the splenic hilum. 2. There is stranding in the fat extending from the tumor to the celiac axis and left adrenal gland. Cannot exclude infiltrating tumor. 3. 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 4. metastatic lesion in the liver    10/29/2018 Initial Biopsy    Diagnosis 10/29/18 FINE NEEDLE ASPIRATION, ENDOSCOPIC, PANCREAS TAIL(SPECIMEN 1 OF 1 COLLECTED 10/29/18): MALIGNANT CELLS PRESENT, CONSISTENT WITH ADENOCARCINOMA. SEE COMMENT. COMMENT: THERE IS INSUFFICIENT TUMOR PRESENT FOR ADDITIONAL STUDIES.    10/29/2018 Cancer Staging    Staging form: Exocrine Pancreas, AJCC 8th Edition - Clinical stage from 10/29/2018: Stage IV (cT3, cN0, cM1) - Signed by Truitt Merle, MD on 11/05/2018    11/04/2018 Initial Diagnosis    Pancreatic cancer ([place])     Chemotherapy    First-Line FOLFIRINOX q2weeks starting in 1-2 weeks         CURRENT THERAPY:  PENDING FOLFIRINOX q2weeks starting in 1-2 weeks   INTERVAL HISTORY:  Vincent [name] is here for a follow up of pancreatic cancer. He  was able to identify hisself by birth date. He notes Vincent PAC placement went well. He notes Vincent abdominal pain is stable with tramadol. He has not taken [facility] yet.  He notes he will undergo eye injection by her eye doctor given ruptured vessel. He plans to do this in the  next week. He does not know name of medication.     REVIEW OF SYSTEMS:   Constitutional: Denies fevers, chills or abnormal weight loss Eyes: Denies blurriness of vision Ears, nose, mouth, throat, and face: Denies mucositis or sore throat Respiratory: Denies cough, dyspnea or wheezes Cardiovascular:  Denies palpitation, chest discomfort or lower extremity swelling Gastrointestinal:  Denies nausea, heartburn or change in bowel habits (+) LUQ pain radiating to Vincent back Skin: Denies abnormal skin rashes Lymphatics: Denies new lymphadenopathy or easy bruising Neurological:Denies numbness, tingling or new weaknesses Behavioral/Psych: Mood is stable, no new changes  All other systems were reviewed with the patient and are negative.  MEDICAL HISTORY:  Past Medical History:  Diagnosis Date  . Diabetes mellitus without complication ([place])    pt. states that he is not diabeticalthought he takes metformin  . Eye hemorrhage    right  . Hypertension     SURGICAL HISTORY: Past Surgical History:  Procedure Laterality Date  . ESOPHAGOGASTRODUODENOSCOPY (EGD) WITH PROPOFOL N/A 10/29/2018   Procedure: ESOPHAGOGASTRODUODENOSCOPY (EGD) WITH PROPOFOL;  Surgeon: Arta Silence, MD;  Location: [facility];  Service: Endoscopy;  Laterality: N/A;  . EUS N/A 10/29/2018   Procedure: FULL UPPER ENDOSCOPIC ULTRASOUND (EUS) RADIAL;  Surgeon: Arta Silence, MD;  Location: [facility];  Service: Endoscopy;  Laterality: N/A;  . FINE NEEDLE ASPIRATION N/A 10/29/2018   Procedure: FINE NEEDLE ASPIRATION (FNA) LINEAR;  Surgeon: Arta Silence, MD;  Location: [facility];  Service: Endoscopy;  Laterality: N/A;  . IR IMAGING GUIDED PORT INSERTION  11/11/2018  . LUMBAR LAMINECTOMY/DECOMPRESSION MICRODISCECTOMY Left 10/11/2017   Procedure: Microlumbar decompression Lumbar Four-Five Left;  Surgeon: Susa Day, MD;  Location: [facility];  Service: Orthopedics;  Laterality: Left;  Microlumbar decompression Lumbar Four-Five Left  . removal of wisdom teeth  1977    I have reviewed the social history and family history with the patient and they are unchanged from previous note.  ALLERGIES:  has No Known Allergies.  MEDICATIONS:  Current Outpatient Medications  Medication Sig Dispense Refill  . Accu-Chek FastClix Lancets  MISC daily. for testing    . aspirin EC 81 MG tablet Take 1 tablet (81 mg total) by mouth daily after breakfast. Resume 4 days post-op    . HYDROcodone-acetaminophen (NORCO) 5-325 MG tablet Take 1 tablet by mouth every 6 (six) hours as needed for moderate pain. 30 tablet 0  . lisinopril (PRINIVIL,ZESTRIL) 10 MG tablet Take 10 mg by mouth daily after breakfast.    . metFORMIN (GLUCOPHAGE) 1000 MG tablet TAKE 1 TABLET BY MOUTH AT BREAKFAST AND 1/2 TABLET AT SUPPER    . metroNIDAZOLE (FLAGYL) 500 MG tablet Take 1 tablet (500 mg total) by mouth 3 (three) times daily. 3 tablet 0  . oxyCODONE (OXY IR/ROXICODONE) 5 MG immediate release tablet Take 1-2 tablets (5-10 mg total) by mouth every 6 (six) hours as needed for severe pain. 45 tablet 0  . pantoprazole (PROTONIX) 40 MG tablet Take 1 tablet (40 mg total) by mouth daily. 30 tablet 0  . simvastatin (ZOCOR) 40 MG tablet Take 20 mg by mouth every evening.    . traMADol (ULTRAM) 50 MG tablet TAKE 1 2 TABLETS EVERY 4 HOURS AS NEEDED FOR PAIN  MAX 8/24HRS     No current facility-administered medications for this visit.     PHYSICAL EXAMINATION: ECOG PERFORMANCE STATUS: 1 - Symptomatic but completely ambulatory  -No vitals taken today   GENERAL:alert, no distress and comfortable SKIN: skin color, texture, turgor are normal, no  rashes or significant lesions EYES: normal, Conjunctiva are pink and non-injected, sclera clear OROPHARYNX:no exudate, no erythema and lips, buccal mucosa, and tongue normal  NECK: supple, thyroid normal size, non-tender, without nodularity LYMPH:  no palpable lymphadenopathy in the cervical, axillary or inguinal LUNGS: clear to auscultation and percussion with normal breathing effort HEART: regular rate & rhythm and no murmurs and no lower extremity edema ABDOMEN:abdomen soft, non-tender and normal bowel sounds Musculoskeletal:no cyanosis of digits and no clubbing  NEURO: alert & oriented x 3 with fluent speech, no focal  motor/sensory deficits -Virtual exam was deferred today   LABORATORY DATA:  I have reviewed the data as listed CBC Latest Ref Rng & Units 11/11/2018 10/09/2018 10/12/2017  WBC 4.0 - 10.5 K/uL 8.1 9.1 7.8  Hemoglobin 13.0 - 17.0 g/dL 15.3 15.4 13.5  Hematocrit 39.0 - 52.0 % 44.9 46.4 40.0  Platelets 150 - 400 K/uL 199 172 151     CMP Latest Ref Rng & Units 10/09/2018 10/12/2017 10/08/2017  Glucose 70 - 99 mg/dL 131(H) 133(H) 119(H)  BUN 8 - 23 mg/dL 28(H) 13 17  Creatinine 0.61 - 1.24 mg/dL 1.06 1.06 1.16  Sodium 135 - 145 mmol/L 138 139 140  Potassium 3.5 - 5.1 mmol/L 4.3 4.0 4.5  Chloride 98 - 111 mmol/L 106 107 107  CO2 22 - 32 mmol/L 20(L) 24 25  Calcium 8.9 - 10.3 mg/dL 9.8 8.7(L) 9.3  Total Protein 6.5 - 8.1 g/dL 7.3 - -  Total Bilirubin 0.3 - 1.2 mg/dL 0.7 - -  Alkaline Phos 38 - 126 U/L 60 - -  AST 15 - 41 U/L 18 - -  ALT 0 - 44 U/L 26 - -      RADIOGRAPHIC STUDIES: I have personally reviewed the radiological images as listed and agreed with the findings in the report. No results found.   ASSESSMENT & PLAN:  Riese Hellard is a 68 y.o. male with   1. 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  -Vincent CT AP and abdominal MRI showed a 1.6cm lesion in the dorm, also it appears likely a benign cyst on the CT scan, however the MRI features are concerning for liver metastasis.  -Vincent PET scan was denied by insurance and Vincent case was not located for me to complete an appeal yet. If they do not agree with PET, he will proceed with CT chest next week. He is agreeable.  -I reviewed Vincent case in GI Tumor board. We discussed if Vincent PET scan shows hypermetabolic uptake of liver lesion, he may not need biopsy to confirm metastasis. If he is not able to proceed with PET we will do liver biopsy for more definitive diagnosis.  -Vincent baseline CA 19-9 was significantly elevated at 3,683 (11/05/18). This is also concerning for  metastatic disease.  -Regardless of metastatic cancer or not, I recommend him to start chemotherapy first. I recommend intensive First-line FOLFIRINOX every 2 weeks. He is a candidate for intensive chemo  --Chemotherapy consent: Side effects including but does not limited to, fatigue, nausea, vomiting, diarrhea, hair loss, neuropathy, fluid retention, renal and kidney dysfunction, neutropenic fever, needed for blood transfusion, bleeding, were discussed with patient in great detail. He agrees to proceed.  -I discussed if he is found to have metastatic disease, Vincent cancer will no longer be curable at stage IV and he will continue with chemo for as long as it controls Vincent disease. Without metastatic disease,  he will continue treatment for 4 months before proceeding with possible surgery.  -Will monitor Vincent response to treatment by Tumor marker, CT scans and clinically by Vincent pain improvement.  -He has PAC placed on 11/11/18. He completed Chemo education class today. Plan to start in 1-2 weeks -Vincent Genetic Testing result is still pending.  -F/u with first treatment  2. Epigastric and LUQ pain radiating to Vincent back  -secondary to pancreatic cancer -Currently on Tramadol 1-2 tabs q4hours. He takes about 6 tabs a day. Pain mainly controlled currently. I previously prescribed him [place]. He has not needed to use it yet.   3. Lower appetite, Weight loss  -Eating exacerbates Vincent epigastric pain and nausea -He has lost 40 pounds recently  -I advised him to start nutritional supplement such as Ensure or Boost to increase Vincent calorie and protein intake. He is agreeable.  -He plans to follow up with Dietician tomorrow   4. DM, HTN -On Metformin and Lisinopril  -Continue to f/u with PCP    PLAN:  -Lab, f/u and FOLFIRINOX in 1-2 weeks  -PET or CT chest in 1 week, if PET denied, will request IR liver biopsy    No problem-specific Assessment & Plan notes found for this encounter.   No orders of the  defined types were placed in this encounter.  I discussed the assessment and treatment plan with the patient. The patient was provided an opportunity to ask questions and all were answered. The patient agreed with the plan and demonstrated an understanding of the instructions.  The patient was advised to call back or seek an in-person evaluation if the symptoms worsen or if the condition fails to improve as anticipated.  I provided 30 minutes of non face-to-face telephone visit time during this encounter, and > 50% was spent counseling as documented under my assessment & plan.    Truitt Merle, MD 11/14/2018   I, Joslyn Devon, am acting as scribe for Truitt Merle, MD.   I have reviewed the above documentation for accuracy and completeness, and I agree with the above.

## 2018-11-15 ENCOUNTER — Telehealth: Payer: Self-pay | Admitting: Hematology

## 2018-11-15 ENCOUNTER — Inpatient Hospital Stay: Payer: BLUE CROSS/BLUE SHIELD | Admitting: Nutrition

## 2018-11-15 NOTE — Progress Notes (Signed)
RD working remotely.  68 year old male diagnosed with Pancreas cancer. He is a patient of Dr. Burr Medico.  PMH includes HTN and DM (patient denies DM)  Medications include Metformin, Flagyl, Protonix  Labs were reviewed.  Height: 6\' 4" . Weight: 244 pounds April 6. Weight in March 2019 was 294 pounds. BMI: 29.7  Patient reports he has abdominal pain after eating which causes nausea. He states he is trying to eat smaller amounts of food. He endorses weight loss of about 50 pounds over the last year. He has constipation which he is working on with medication. Drinks Premier Protein (160 kcal, 30 grams Protein) BID.  Nutrition Diagnosis: Unintentional weight loss related to pancreas cancer and associated side effects as evidenced by 50 Pound weight loss/17% weight loss over 1 year.  Intervention: Educated patient to eat small, frequent meals and snacks consisting of adequate calories and protein. Reviewed high protein foods. Educated patient pt increase water. Continue protein shakes but add calories. Email fact sheets with contact number.  Monitoring, Evaluation, Goals: Increase oral intake to minimize weight loss.  Next Visit: Patient will contact me with questions.

## 2018-11-15 NOTE — Telephone Encounter (Signed)
Scheduled appt per 4/9 los.  Called patient and was not able to reach the patient, left a VM of scheduled appts.

## 2018-11-18 ENCOUNTER — Other Ambulatory Visit: Payer: Self-pay

## 2018-11-19 ENCOUNTER — Encounter (INDEPENDENT_AMBULATORY_CARE_PROVIDER_SITE_OTHER): Payer: BLUE CROSS/BLUE SHIELD | Admitting: Ophthalmology

## 2018-11-19 ENCOUNTER — Other Ambulatory Visit: Payer: Self-pay

## 2018-11-19 DIAGNOSIS — H35033 Hypertensive retinopathy, bilateral: Secondary | ICD-10-CM | POA: Diagnosis not present

## 2018-11-19 DIAGNOSIS — I10 Essential (primary) hypertension: Secondary | ICD-10-CM | POA: Diagnosis not present

## 2018-11-19 DIAGNOSIS — H34811 Central retinal vein occlusion, right eye, with macular edema: Secondary | ICD-10-CM | POA: Diagnosis not present

## 2018-11-19 DIAGNOSIS — H43813 Vitreous degeneration, bilateral: Secondary | ICD-10-CM

## 2018-11-19 DIAGNOSIS — H2513 Age-related nuclear cataract, bilateral: Secondary | ICD-10-CM

## 2018-11-21 ENCOUNTER — Telehealth: Payer: Self-pay | Admitting: *Deleted

## 2018-11-21 NOTE — Telephone Encounter (Signed)
Received vm call from pt's wife asking for return call.  Called wife & pt on speaker phone & answered questions about anti-nausea meds, hair loss, pain meds, & length of treatment.  Both appreciated return call & informed to call for any other questions.

## 2018-11-22 ENCOUNTER — Other Ambulatory Visit: Payer: Self-pay | Admitting: Hematology

## 2018-11-22 DIAGNOSIS — D490 Neoplasm of unspecified behavior of digestive system: Secondary | ICD-10-CM

## 2018-11-25 ENCOUNTER — Telehealth: Payer: Self-pay

## 2018-11-25 NOTE — Telephone Encounter (Signed)
Left voice message for this patient regarding CT scan, scheduled for Wednesday 4/22 at 1:00, will need to get labs at 12:00 at [facility] then go to [facility] for scan.  Appointment for lab on 4/23 has been cancelled however will get his port flushed at 10:15 preceding seeing Dr. Burr Medico.   NPO 4 hours before scan, liquids only, does not have to drink contrast per scheduler Peggy.  Requested patient call back if he has questions.

## 2018-11-27 ENCOUNTER — Encounter (HOSPITAL_COMMUNITY): Payer: Self-pay

## 2018-11-27 ENCOUNTER — Other Ambulatory Visit: Payer: Self-pay

## 2018-11-27 ENCOUNTER — Ambulatory Visit (HOSPITAL_COMMUNITY)
Admission: RE | Admit: 2018-11-27 | Discharge: 2018-11-27 | Disposition: A | Discharge status: Home / Self Care | Payer: BLUE CROSS/BLUE SHIELD | Source: Ambulatory Visit | Attending: Hematology | Admitting: Hematology

## 2018-11-27 ENCOUNTER — Inpatient Hospital Stay: Payer: BLUE CROSS/BLUE SHIELD

## 2018-11-27 DIAGNOSIS — I10 Essential (primary) hypertension: Secondary | ICD-10-CM | POA: Diagnosis not present

## 2018-11-27 DIAGNOSIS — E119 Type 2 diabetes mellitus without complications: Secondary | ICD-10-CM | POA: Diagnosis not present

## 2018-11-27 DIAGNOSIS — C787 Secondary malignant neoplasm of liver and intrahepatic bile duct: Secondary | ICD-10-CM | POA: Diagnosis not present

## 2018-11-27 DIAGNOSIS — C252 Malignant neoplasm of tail of pancreas: Secondary | ICD-10-CM | POA: Diagnosis present

## 2018-11-27 DIAGNOSIS — Z5111 Encounter for antineoplastic chemotherapy: Secondary | ICD-10-CM | POA: Diagnosis not present

## 2018-11-27 DIAGNOSIS — D490 Neoplasm of unspecified behavior of digestive system: Secondary | ICD-10-CM | POA: Diagnosis not present

## 2018-11-27 DIAGNOSIS — Z79899 Other long term (current) drug therapy: Secondary | ICD-10-CM | POA: Diagnosis not present

## 2018-11-27 DIAGNOSIS — R634 Abnormal weight loss: Secondary | ICD-10-CM | POA: Diagnosis not present

## 2018-11-27 DIAGNOSIS — R110 Nausea: Secondary | ICD-10-CM | POA: Diagnosis not present

## 2018-11-27 DIAGNOSIS — Z7984 Long term (current) use of oral hypoglycemic drugs: Secondary | ICD-10-CM | POA: Diagnosis not present

## 2018-11-27 LAB — CMP (CANCER CENTER ONLY)
ALT: 39 U/L (ref 0–44)
AST: 16 U/L (ref 15–41)
Albumin: 3.9 g/dL (ref 3.5–5.0)
Alkaline Phosphatase: 89 U/L (ref 38–126)
Anion gap: 9 (ref 5–15)
BUN: 12 mg/dL (ref 8–23)
CO2: 26 mmol/L (ref 22–32)
Calcium: 9.5 mg/dL (ref 8.9–10.3)
Chloride: 103 mmol/L (ref 98–111)
Creatinine: 0.91 mg/dL (ref 0.61–1.24)
GFR, Est AFR Am: >60 mL/min (ref >60)
GFR, Estimated: >60 mL/min (ref >60)
Glucose, Bld: 154 mg/dL — ABNORMAL HIGH (ref 70–99)
Potassium: 4.1 mmol/L (ref 3.5–5.1)
Sodium: 138 mmol/L (ref 135–145)
Total Bilirubin: 0.8 mg/dL (ref 0.3–1.2)
Total Protein: 7.4 g/dL (ref 6.5–8.1)

## 2018-11-27 LAB — CBC WITH DIFFERENTIAL (CANCER CENTER ONLY)
Abs Immature Granulocytes: 0.01 10*3/uL (ref 0.00–0.07)
Basophils Absolute: 0.1 10*3/uL (ref 0.0–0.1)
Basophils Relative: 1 %
Eosinophils Absolute: 0.1 10*3/uL (ref 0.0–0.5)
Eosinophils Relative: 2 %
HCT: 44 % (ref 39.0–52.0)
Hemoglobin: 14.6 g/dL (ref 13.0–17.0)
Immature Granulocytes: 0 %
Lymphocytes Relative: 18 %
Lymphs Abs: 1 10*3/uL (ref 0.7–4.0)
MCH: 32.6 pg (ref 26.0–34.0)
MCHC: 33.2 g/dL (ref 30.0–36.0)
MCV: 98.2 fL (ref 80.0–100.0)
Monocytes Absolute: 0.5 10*3/uL (ref 0.1–1.0)
Monocytes Relative: 9 %
Neutro Abs: 3.8 10*3/uL (ref 1.7–7.7)
Neutrophils Relative %: 70 %
Platelet Count: 194 10*3/uL (ref 150–400)
RBC: 4.48 MIL/uL (ref 4.22–5.81)
RDW: 12.2 % (ref 11.5–15.5)
WBC Count: 5.4 10*3/uL (ref 4.0–10.5)
nRBC: 0 % (ref 0.0–0.2)

## 2018-11-27 MED ORDER — SODIUM CHLORIDE (PF) 0.9 % IJ SOLN
INTRAMUSCULAR | Status: AC
  Filled 2018-11-27: qty 50

## 2018-11-27 MED ORDER — IOHEXOL 300 MG/ML  SOLN
75.0000 mL | Freq: Once | INTRAMUSCULAR | Status: AC | PRN
  Administered 2018-11-27: 14:00:00 75 mL via INTRAVENOUS

## 2018-11-27 NOTE — Progress Notes (Signed)
Vincent [name]   [phone] [phone]   Clinic Follow up Note   Patient Care Team: Leonard Downing, MD as PCP - General (Family Medicine)  Date of Service:  11/28/2018  CHIEF COMPLAINT: F/u of Pancreatic Cancer   SUMMARY OF ONCOLOGIC HISTORY: Oncology History   Cancer Staging Pancreatic cancer [facility]) Staging form: Exocrine Pancreas, AJCC 8th Edition - Clinical stage from 10/29/2018: Stage IV (cT3, cN0, cM1) - Signed by Truitt Merle, MD on 11/05/2018       Pancreatic cancer ([place])   10/09/2018 Procedure    Upper Endoscopy by. Dr Paulita Fujita 10/29/18 IMPRESSION - There was no evidence of significant pathology in the left lobe of the liver. - There was no sign of significant pathology in the gallbladder. - Many abnormal lymph nodes were visualized in the splenic region (level 19), perigastric region and peripancreatic region. - A mass was identified in the pancreatic tail. This was staged T4 N2 Mx by endosonographic criteria. Fine needle aspiration performed.    10/10/2018 Imaging    CT Abdomen 10/10/18 IMPRESSION: 1. Uncomplicated acute diverticulitis of the proximal sigmoid colon. 2. Ill-defined low-density in the pancreatic tail measuring at least 4.7 cm, with possible punctate peripheral calcifications. This is suspicious for pancreatic neoplasm, however sequela of prior pancreatitis is also considered. Recommend further characterization with pancreatic protocol MRI when patient is able to tolerate breath hold technique. 3. 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 4. 16 mm cyst in the left lobe of the liver. Aortic Atherosclerosis (ICD10-I70.0).    10/16/2018 Imaging    MRI Abdomen at [facility] 10/16/18 IMPRESSION: 1. Mass in the tail the pancreas concerning for neoplasm. This contacts the spleen in the splenic hilum. 2. There is stranding in the fat extending from the tumor to the  celiac axis and left adrenal gland. Cannot exclude infiltrating tumor. 3. 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 4. metastatic lesion in the liver    10/29/2018 Initial Biopsy    Diagnosis 10/29/18 FINE NEEDLE ASPIRATION, ENDOSCOPIC, PANCREAS TAIL(SPECIMEN 1 OF 1 COLLECTED 10/29/18): MALIGNANT CELLS PRESENT, CONSISTENT WITH ADENOCARCINOMA. SEE COMMENT. COMMENT: THERE IS INSUFFICIENT TUMOR PRESENT FOR ADDITIONAL STUDIES.    10/29/2018 Cancer Staging    Staging form: Exocrine Pancreas, AJCC 8th Edition - Clinical stage from 10/29/2018: Stage IV (cT3, cN0, cM1) - Signed by Truitt Merle, MD on 11/05/2018    11/04/2018 Initial Diagnosis    Pancreatic cancer ([name])    11/27/2018 Imaging    CT Chest  IMPRESSION: 1.  No acute process or evidence of metastatic disease in the chest. 2. Locally advanced pancreatic carcinoma, as detailed previously. Cannot exclude superimposed pancreatitis. 3. New and enlarging liver lesions, suspicious for hepatic metastasis. 4. Aortic atherosclerosis (ICD10-I70.0), coronary artery atherosclerosis and emphysema (ICD10-J43.9).    11/28/2018 -  Chemotherapy    First-Line FOLFIRINOX q2weeks starting 11/28/18      CURRENT THERAPY:  First line FOLFIRINOX q2weeks starting 11/28/18  INTERVAL HISTORY:  Vincent [name] is here for a follow up and start of chemo. He presents to the clinic today by himself. He notes he still has upper left abdominal pain and uses tramadol twice q2hours. He feels eating too much can increase his pain and cause nausea. I reviewed medication list with him. He would like to proceed with chemo today and think about biopsy with his wife.    REVIEW OF SYSTEMS:   Constitutional: Denies fevers, chills or abnormal weight loss  Eyes: Denies blurriness of vision Ears, nose, mouth, throat, and face: Denies mucositis or sore throat Respiratory: Denies cough, dyspnea or wheezes Cardiovascular: Denies palpitation, chest discomfort or  lower extremity swelling Gastrointestinal:  Denies nausea, heartburn or change in bowel habits (+) upper left abdominal pain  Skin: Denies abnormal skin rashes Lymphatics: Denies new lymphadenopathy or easy bruising Neurological:Denies numbness, tingling or new weaknesses Behavioral/Psych: Mood is stable, no new changes  All other systems were reviewed with the patient and are negative.  MEDICAL HISTORY:  Past Medical History:  Diagnosis Date   Diabetes mellitus without complication ([place])    pt. states that he is not diabeticalthought he takes metformin   Eye hemorrhage    right   Hypertension     SURGICAL HISTORY: Past Surgical History:  Procedure Laterality Date   ESOPHAGOGASTRODUODENOSCOPY (EGD) WITH PROPOFOL N/A 10/29/2018   Procedure: ESOPHAGOGASTRODUODENOSCOPY (EGD) WITH PROPOFOL;  Surgeon: Arta Silence, MD;  Location: [facility];  Service: Endoscopy;  Laterality: N/A;   EUS N/A 10/29/2018   Procedure: FULL UPPER ENDOSCOPIC ULTRASOUND (EUS) RADIAL;  Surgeon: Arta Silence, MD;  Location: [facility];  Service: Endoscopy;  Laterality: N/A;   FINE NEEDLE ASPIRATION N/A 10/29/2018   Procedure: FINE NEEDLE ASPIRATION (FNA) LINEAR;  Surgeon: Arta Silence, MD;  Location: [facility];  Service: Endoscopy;  Laterality: N/A;   IR IMAGING GUIDED PORT INSERTION  11/11/2018   LUMBAR LAMINECTOMY/DECOMPRESSION MICRODISCECTOMY Left 10/11/2017   Procedure: Microlumbar decompression Lumbar Four-Five Left;  Surgeon: Susa Day, MD;  Location: [place];  Service: Orthopedics;  Laterality: Left;  Microlumbar decompression Lumbar Four-Five Left   removal of wisdom teeth  1977    I have reviewed the social history and family history with the patient and they are unchanged from previous note.  ALLERGIES:  has No Known Allergies.  MEDICATIONS:  Current Outpatient Medications  Medication Sig Dispense Refill   Accu-Chek FastClix Lancets MISC daily. for testing     aspirin EC 81  MG tablet Take 1 tablet (81 mg total) by mouth daily after breakfast. Resume 4 days post-op     HYDROcodone-acetaminophen (NORCO) 5-325 MG tablet Take 1 tablet by mouth every 6 (six) hours as needed for moderate pain. 30 tablet 0   lidocaine-prilocaine (EMLA) cream Apply to affected area once 30 g 3   lisinopril (PRINIVIL,ZESTRIL) 10 MG tablet Take 10 mg by mouth daily after breakfast.     metFORMIN (GLUCOPHAGE) 1000 MG tablet TAKE 1 TABLET BY MOUTH AT BREAKFAST AND 1/2 TABLET AT SUPPER     ondansetron (ZOFRAN) 8 MG tablet Take 1 tablet (8 mg total) by mouth 2 (two) times daily as needed for refractory nausea / vomiting. Start on day 3 after chemotherapy. 30 tablet 1   oxyCODONE (OXY IR/ROXICODONE) 5 MG immediate release tablet Take 1-2 tablets (5-10 mg total) by mouth every 6 (six) hours as needed for severe pain. 45 tablet 0   pantoprazole (PROTONIX) 40 MG tablet Take 1 tablet (40 mg total) by mouth daily. 30 tablet 0   prochlorperazine (COMPAZINE) 10 MG tablet Take 1 tablet (10 mg total) by mouth every 6 (six) hours as needed (NAUSEA). 30 tablet 1   simvastatin (ZOCOR) 40 MG tablet Take 20 mg by mouth every evening.     traMADol (ULTRAM) 50 MG tablet Take 2 tablets (100 mg total) by mouth every 6 (six) hours as needed. 120 tablet 1   No current facility-administered medications for this visit.    Facility-Administered Medications Ordered in Other Visits  Medication Dose Route Frequency Provider Last Rate Last Dose   fluorouracil (ADRUCIL) 5,850 mg in sodium chloride 0.9 % 133 mL chemo infusion  2,400 mg/m2 (Treatment Plan Recorded) Intravenous 1 day or 1 dose Truitt Merle, MD       irinotecan (CAMPTOSAR) 360 mg in dextrose 5 % 500 mL chemo infusion  150 mg/m2 (Treatment Plan Recorded) Intravenous Once Truitt Merle, MD 345 mL/hr at 11/28/18 1555 360 mg at 11/28/18 1555   leucovorin 976 mg in dextrose 5 % 250 mL infusion  400 mg/m2 (Treatment Plan Recorded) Intravenous Once Truitt Merle, MD  199 mL/hr at 11/28/18 1559 976 mg at 11/28/18 1559    PHYSICAL EXAMINATION: ECOG PERFORMANCE STATUS: 1 - Symptomatic but completely ambulatory  Vitals:   11/28/18 1120  BP: 107/84  Pulse: (!) 101  Resp: 18  Temp: 98.1 F (36.7 C)  SpO2: 96%   Filed Weights   11/28/18 1120  Weight: 232 lb 8 oz (105.5 kg)    GENERAL:alert, no distress and comfortable SKIN: skin color, texture, turgor are normal, no rashes or significant lesions EYES: normal, Conjunctiva are pink and non-injected, sclera clear OROPHARYNX:no exudate, no erythema and lips, buccal mucosa, and tongue normal  NECK: supple, thyroid normal size, non-tender, without nodularity LYMPH:  no palpable lymphadenopathy in the cervical, axillary or inguinal LUNGS: clear to auscultation and percussion with normal breathing effort HEART: regular rate & rhythm and no murmurs and no lower extremity edema ABDOMEN:abdomen soft, non-tender and normal bowel sounds Musculoskeletal:no cyanosis of digits and no clubbing  NEURO: alert & oriented x 3 with fluent speech, no focal motor/sensory deficits  LABORATORY DATA:  I have reviewed the data as listed CBC Latest Ref Rng & Units 11/27/2018 11/11/2018 10/09/2018  WBC 4.0 - 10.5 K/uL 5.4 8.1 9.1  Hemoglobin 13.0 - 17.0 g/dL 14.6 15.3 15.4  Hematocrit 39.0 - 52.0 % 44.0 44.9 46.4  Platelets 150 - 400 K/uL 194 199 172     CMP Latest Ref Rng & Units 11/27/2018 10/09/2018 10/12/2017  Glucose 70 - 99 mg/dL 154(H) 131(H) 133(H)  BUN 8 - 23 mg/dL 12 28(H) 13  Creatinine 0.61 - 1.24 mg/dL 0.91 1.06 1.06  Sodium 135 - 145 mmol/L 138 138 139  Potassium 3.5 - 5.1 mmol/L 4.1 4.3 4.0  Chloride 98 - 111 mmol/L 103 106 107  CO2 22 - 32 mmol/L 26 20(L) 24  Calcium 8.9 - 10.3 mg/dL 9.5 9.8 8.7(L)  Total Protein 6.5 - 8.1 g/dL 7.4 7.3 -  Total Bilirubin 0.3 - 1.2 mg/dL 0.8 0.7 -  Alkaline Phos 38 - 126 U/L 89 60 -  AST 15 - 41 U/L 16 18 -  ALT 0 - 44 U/L 39 26 -      RADIOGRAPHIC STUDIES: I have  personally reviewed the radiological images as listed and agreed with the findings in the report. Ct Chest W Contrast  Result Date: 11/28/2018 CLINICAL DATA:  Staging of pancreatic cancer; diagnosed in March. EXAM: CT CHEST WITH CONTRAST TECHNIQUE: Multidetector CT imaging of the chest was performed during intravenous contrast administration. CONTRAST:  1mL OMNIPAQUE IOHEXOL 300 MG/ML  SOLN COMPARISON:  08/31/2017 chest radiograph. Abdominal CT of 10/10/2018. outside abdominal MRI of 10/16/2018 FINDINGS: Cardiovascular: A right Port-A-Cath with tip at low SVC. Aortic atherosclerosis. Tortuous thoracic aorta. Normal heart size, without pericardial effusion. Multivessel coronary artery atherosclerosis. No central pulmonary embolism, on this non-dedicated study. Mediastinum/Nodes: No supraclavicular adenopathy. No mediastinal or hilar adenopathy. Lungs/Pleura: No pleural fluid.  Mild centrilobular  emphysema. No suspicious pulmonary nodule or mass. Upper Abdomen: Left hepatic lobe low-density lesion is ill-defined and better evaluated on prior MRI. 2.9 cm on image 129/2. Enlarged from the prior exams. There is also a right hepatic lobe hypoattenuating lesion of 1.9 cm on image 174/2. This is new since the prior CT. Gallbladder distension. Normal imaged portions of the spleen, stomach, adrenal glands, kidneys. Pancreatic body and tail infiltrative lesion with direct extension posteriorly, including surrounding the celiac on image 149/2 and adjacent the SMA on image 158/2. Mild peripancreatic interstitial thickening. Musculoskeletal: Moderate thoracic spondylosis. IMPRESSION: 1.  No acute process or evidence of metastatic disease in the chest. 2. Locally advanced pancreatic carcinoma, as detailed previously. Cannot exclude superimposed pancreatitis. 3. New and enlarging liver lesions, suspicious for hepatic metastasis. 4. Aortic atherosclerosis (ICD10-I70.0), coronary artery atherosclerosis and emphysema  (ICD10-J43.9). Electronically Signed   By: Abigail Miyamoto M.D.   On: 11/28/2018 08:54     ASSESSMENT & PLAN:  Vincent [name] is a 68 y.o. male with   1. Adenocarcinoma oftail of Pancreas, cT3N0Mx, with liver metastasis -He was recently diagnosed in 10/2018.  -His CT APand abdominal MRI showed a 1.6cm lesion in thedorm, also it appears likely a benign cyst on the CT scan, however the MRI features are concerning for livermetastasis.  -His baseline CA 19-9 was significantly elevated at 3,683 (11/05/18). This is also concerning for metastatic disease.  -I discussed his CT Chest from 11/27/18 shows no acute process or evidence of metastatic disease in chest. It also showed growth of known liver lesions (2.9cm) along with a new 1.6cm liver lesion in inferior right lobe, which are highly suspicious for metastasis.  -I discussed to confirm with definitive prognosis we can do liver biopsy. Given the typical scan findings and growth over time, the liver lesions are likely metastatic lesions.  Given the current COVID-19, I will probably skip biopsy. He will think about this and discuss with his wife.  -I discussed if metastatic disease, he is no longer a surgical candidate and his cancer is no longer curable. Goal of therapy would be palliative to prolong his life and improve his quality of life. He understands.  -I will start him on first-line FOLFIRINOX every 2 weeks today. I again reviewed side effects with him. I discussed the risk of neutropenia and neutropenic fever, and I will recommend Udenyca injection on day 3. This injection can cause bone pain so I recommend he use Claritin daily for 5 days after injection.  -Yesterday's labs reviewed, CBC and CMP WNL except BG at 154. Overall adequate to proceed with FOLFIRINOX today.  -I encouraged him to contact clinic if he develops unexpected or significant side effects or signs of infection such as chills and fever.  -F/u every 2 weeks    2.  Epigastric and LUQ pain radiating to his back  -secondary topancreaticcancer -Currently on Tramadol 1-2 tabs q4hours. He takes about 6 tabs a day. Pain mainly controlled currently.  -I previously prescribed him NORCO but does not feel it helps as much as Tramadol. I advised him not to go over 8 tabs Tramadol in a day.   3. Lower appetite, Weight loss  -Eating exacerbates his epigastric pain and nausea -He has lost 40 pounds recently and weight continues to trend down.   -I advised him to have a high protein and high calorie diet. If he is not eating enough I instructed him to increase nutritional supplement such as Ensure or Boost. He is  agreeable.  -Continue to follow up with Dietician   4. DM, HTN -On Metformin and Lisinopril  -We will monitor his blood pressure and blood glucose closely during the chemotherapy, we discussed that chemo may impact his BP and BG level, and the possibility of adjusting his medication during chemo if needed -Continue to f/u with PCP   PLAN: -I refilled Tramadol today  -labs reviewed and adequate to proceed with C1 mFOLFIRINOX today  -lab, flush, and chemo every 2 weeks  -F/u with Lacie in 2 weeks and f/u with me in 4 weeks    No problem-specific Assessment & Plan notes found for this encounter.   No orders of the defined types were placed in this encounter.  All questions were answered. The patient knows to call the clinic with any problems, questions or concerns. No barriers to learning was detected. I spent 20 minutes counseling the patient face to face. The total time spent in the appointment was 25 minutes and more than 50% was on counseling and review of test results     Truitt Merle, MD 11/28/2018   I, Joslyn Devon, am acting as scribe for Truitt Merle, MD.   I have reviewed the above documentation for accuracy and completeness, and I agree with the above.

## 2018-11-28 ENCOUNTER — Inpatient Hospital Stay: Payer: BLUE CROSS/BLUE SHIELD

## 2018-11-28 ENCOUNTER — Encounter: Payer: Self-pay | Admitting: Hematology

## 2018-11-28 ENCOUNTER — Telehealth: Payer: Self-pay | Admitting: Hematology

## 2018-11-28 ENCOUNTER — Other Ambulatory Visit: Payer: Self-pay

## 2018-11-28 ENCOUNTER — Inpatient Hospital Stay (HOSPITAL_BASED_OUTPATIENT_CLINIC_OR_DEPARTMENT_OTHER): Payer: BLUE CROSS/BLUE SHIELD | Admitting: Hematology

## 2018-11-28 VITALS — BP 107/84 | HR 101 | Temp 98.1°F | Resp 18 | Ht 76.0 in | Wt 232.5 lb

## 2018-11-28 DIAGNOSIS — R110 Nausea: Secondary | ICD-10-CM | POA: Diagnosis not present

## 2018-11-28 DIAGNOSIS — M545 Low back pain, unspecified: Secondary | ICD-10-CM

## 2018-11-28 DIAGNOSIS — R634 Abnormal weight loss: Secondary | ICD-10-CM | POA: Diagnosis not present

## 2018-11-28 DIAGNOSIS — C252 Malignant neoplasm of tail of pancreas: Secondary | ICD-10-CM

## 2018-11-28 DIAGNOSIS — C787 Secondary malignant neoplasm of liver and intrahepatic bile duct: Secondary | ICD-10-CM

## 2018-11-28 DIAGNOSIS — Z7189 Other specified counseling: Secondary | ICD-10-CM

## 2018-11-28 DIAGNOSIS — Z95828 Presence of other vascular implants and grafts: Secondary | ICD-10-CM

## 2018-11-28 DIAGNOSIS — Z79899 Other long term (current) drug therapy: Secondary | ICD-10-CM

## 2018-11-28 MED ORDER — PALONOSETRON HCL INJECTION 0.25 MG/5ML
0.2500 mg | Freq: Once | INTRAVENOUS | Status: AC
  Administered 2018-11-28: 0.25 mg via INTRAVENOUS

## 2018-11-28 MED ORDER — SODIUM CHLORIDE 0.9 % IV SOLN
2400.0000 mg/m2 | INTRAVENOUS | Status: DC
  Administered 2018-11-28: 5850 mg via INTRAVENOUS
  Filled 2018-11-28: qty 117

## 2018-11-28 MED ORDER — DEXTROSE 5 % IV SOLN
Freq: Once | INTRAVENOUS | Status: AC
  Administered 2018-11-28: 13:00:00 via INTRAVENOUS
  Filled 2018-11-28: qty 250

## 2018-11-28 MED ORDER — OXALIPLATIN CHEMO INJECTION 100 MG/20ML
82.0000 mg/m2 | Freq: Once | INTRAVENOUS | Status: AC
  Administered 2018-11-28: 200 mg via INTRAVENOUS
  Filled 2018-11-28: qty 40

## 2018-11-28 MED ORDER — PALONOSETRON HCL INJECTION 0.25 MG/5ML
INTRAVENOUS | Status: AC
  Filled 2018-11-28: qty 5

## 2018-11-28 MED ORDER — TRAMADOL HCL 50 MG PO TABS: 100.0000 mg | ORAL_TABLET | Freq: Four times a day (QID) | ORAL | 1 refills | Status: DC | PRN

## 2018-11-28 MED ORDER — ACETAMINOPHEN 500 MG PO TABS
1000.0000 mg | ORAL_TABLET | Freq: Once | ORAL | Status: AC
  Administered 2018-11-28: 1000 mg via ORAL

## 2018-11-28 MED ORDER — ATROPINE SULFATE 1 MG/ML IJ SOLN
INTRAMUSCULAR | Status: AC
  Filled 2018-11-28: qty 1

## 2018-11-28 MED ORDER — ACETAMINOPHEN 500 MG PO TABS
ORAL_TABLET | ORAL | Status: AC
  Filled 2018-11-28: qty 2

## 2018-11-28 MED ORDER — ATROPINE SULFATE 1 MG/ML IJ SOLN
0.5000 mg | Freq: Once | INTRAMUSCULAR | Status: AC | PRN
  Administered 2018-11-28: 0.5 mg via INTRAVENOUS

## 2018-11-28 MED ORDER — DEXTROSE 5 % IV SOLN
Freq: Once | INTRAVENOUS | Status: AC
  Administered 2018-11-28: 15:00:00 via INTRAVENOUS
  Filled 2018-11-28: qty 250

## 2018-11-28 MED ORDER — LEUCOVORIN CALCIUM INJECTION 350 MG
400.0000 mg/m2 | Freq: Once | INTRAVENOUS | Status: AC
  Administered 2018-11-28: 976 mg via INTRAVENOUS
  Filled 2018-11-28: qty 48.8

## 2018-11-28 MED ORDER — IRINOTECAN HCL CHEMO INJECTION 100 MG/5ML
150.0000 mg/m2 | Freq: Once | INTRAVENOUS | Status: AC
  Administered 2018-11-28: 360 mg via INTRAVENOUS
  Filled 2018-11-28: qty 15

## 2018-11-28 MED ORDER — DEXAMETHASONE SODIUM PHOSPHATE 10 MG/ML IJ SOLN
INTRAMUSCULAR | Status: AC
  Filled 2018-11-28: qty 1

## 2018-11-28 MED ORDER — DEXAMETHASONE SODIUM PHOSPHATE 10 MG/ML IJ SOLN
10.0000 mg | Freq: Once | INTRAMUSCULAR | Status: AC
  Administered 2018-11-28: 10 mg via INTRAVENOUS

## 2018-11-28 MED ORDER — SODIUM CHLORIDE 0.9% FLUSH
10.0000 mL | INTRAVENOUS | Status: DC | PRN
  Administered 2018-11-28: 11:00:00 10 mL via INTRAVENOUS
  Filled 2018-11-28: qty 10

## 2018-11-28 NOTE — Telephone Encounter (Signed)
Scheduled appt per 4/23 los. °

## 2018-11-28 NOTE — Progress Notes (Signed)
Pt c/o of back discomfort & requested 2 extra strength tylenol.  Order received from Dr Burr Medico & medication given. Pt c/o of cramping in abdomen.  Atropine given with relief. Discussed fluorouracil with DR Burr Medico & received OK to increase rate to get medication in by 1pm.  Will increase rate to go over 42 hours instead of 46.   Pain originally # 6 & down to # 1 with tylenol.  Cramping gone.

## 2018-11-28 NOTE — Patient Instructions (Signed)
[facility] Discharge Instructions for Patients Receiving Chemotherapy  Today you received the following chemotherapy agents:  Oxaliplatin, Leucovorin, Camptosar, Fluorouracil, Pump To help prevent nausea and vomiting after your treatment, we encourage you to take your nausea medication as prescribed.    If you develop nausea and vomiting that is not controlled by your nausea medication, call the clinic.   BELOW ARE SYMPTOMS THAT SHOULD BE REPORTED IMMEDIATELY:  *FEVER GREATER THAN 100.5 F  *CHILLS WITH OR WITHOUT FEVER  NAUSEA AND VOMITING THAT IS NOT CONTROLLED WITH YOUR NAUSEA MEDICATION  *UNUSUAL SHORTNESS OF BREATH  *UNUSUAL BRUISING OR BLEEDING  TENDERNESS IN MOUTH AND THROAT WITH OR WITHOUT PRESENCE OF ULCERS  *URINARY PROBLEMS  *BOWEL PROBLEMS  UNUSUAL RASH Items with * indicate a potential emergency and should be followed up as soon as possible.  Feel free to call the clinic should you have any questions or concerns. The clinic phone number is [phone].  Please show the [facility] at check-in to the Emergency Department and triage nurse.  Oxaliplatin Injection What is this medicine? OXALIPLATIN (ox AL i PLA tin) is a chemotherapy drug. It targets fast dividing cells, like cancer cells, and causes these cells to die. This medicine is used to treat cancers of the colon and rectum, and many other cancers. This medicine may be used for other purposes; ask your health care provider or pharmacist if you have questions. COMMON BRAND NAME(S): Eloxatin What should I tell my health care provider before I take this medicine? They need to know if you have any of these conditions: -kidney disease -an unusual or allergic reaction to oxaliplatin, other chemotherapy, other medicines, foods, dyes, or preservatives -pregnant or trying to get pregnant -breast-feeding How should I use this medicine? This drug is given as an infusion into a vein. It is  administered in a hospital or clinic by a specially trained health care professional. Talk to your pediatrician regarding the use of this medicine in children. Special care may be needed. Overdosage: If you think you have taken too much of this medicine contact a poison control center or emergency room at once. NOTE: This medicine is only for you. Do not share this medicine with others. What if I miss a dose? It is important not to miss a dose. Call your doctor or health care professional if you are unable to keep an appointment. What may interact with this medicine? -medicines to increase blood counts like filgrastim, pegfilgrastim, sargramostim -probenecid -some antibiotics like amikacin, gentamicin, neomycin, polymyxin B, streptomycin, tobramycin -zalcitabine Talk to your doctor or health care professional before taking any of these medicines: -acetaminophen -aspirin -ibuprofen -ketoprofen -naproxen This list may not describe all possible interactions. Give your health care provider a list of all the medicines, herbs, non-prescription drugs, or dietary supplements you use. Also tell them if you smoke, drink alcohol, or use illegal drugs. Some items may interact with your medicine. What should I watch for while using this medicine? Your condition will be monitored carefully while you are receiving this medicine. You will need important blood work done while you are taking this medicine. This medicine can make you more sensitive to cold. Do not drink cold drinks or use ice. Cover exposed skin before coming in contact with cold temperatures or cold objects. When out in cold weather wear warm clothing and cover your mouth and nose to warm the air that goes into your lungs. Tell your doctor if you get sensitive to  the cold. This drug may make you feel generally unwell. This is not uncommon, as chemotherapy can affect healthy cells as well as cancer cells. Report any side effects. Continue your  course of treatment even though you feel ill unless your doctor tells you to stop. In some cases, you may be given additional medicines to help with side effects. Follow all directions for their use. Call your doctor or health care professional for advice if you get a fever, chills or sore throat, or other symptoms of a cold or flu. Do not treat yourself. This drug decreases your body's ability to fight infections. Try to avoid being around people who are sick. This medicine may increase your risk to bruise or bleed. Call your doctor or health care professional if you notice any unusual bleeding. Be careful brushing and flossing your teeth or using a toothpick because you may get an infection or bleed more easily. If you have any dental work done, tell your dentist you are receiving this medicine. Avoid taking products that contain aspirin, acetaminophen, ibuprofen, naproxen, or ketoprofen unless instructed by your doctor. These medicines may hide a fever. Do not become pregnant while taking this medicine. Women should inform their doctor if they wish to become pregnant or think they might be pregnant. There is a potential for serious side effects to an unborn child. Talk to your health care professional or pharmacist for more information. Do not breast-feed an infant while taking this medicine. Call your doctor or health care professional if you get diarrhea. Do not treat yourself. What side effects may I notice from receiving this medicine? Side effects that you should report to your doctor or health care professional as soon as possible: -allergic reactions like skin rash, itching or hives, swelling of the face, lips, or tongue -low blood counts - This drug may decrease the number of white blood cells, red blood cells and platelets. You may be at increased risk for infections and bleeding. -signs of infection - fever or chills, cough, sore throat, pain or difficulty passing urine -signs of decreased  platelets or bleeding - bruising, pinpoint red spots on the skin, black, tarry stools, nosebleeds -signs of decreased red blood cells - unusually weak or tired, fainting spells, lightheadedness -breathing problems -chest pain, pressure -cough -diarrhea -jaw tightness -mouth sores -nausea and vomiting -pain, swelling, redness or irritation at the injection site -pain, tingling, numbness in the hands or feet -problems with balance, talking, walking -redness, blistering, peeling or loosening of the skin, including inside the mouth -trouble passing urine or change in the amount of urine Side effects that usually do not require medical attention (report to your doctor or health care professional if they continue or are bothersome): -changes in vision -constipation -hair loss -loss of appetite -metallic taste in the mouth or changes in taste -stomach pain This list may not describe all possible side effects. Call your doctor for medical advice about side effects. You may report side effects to FDA at 1-800-FDA-1088. Where should I keep my medicine? This drug is given in a hospital or clinic and will not be stored at home. NOTE: This sheet is a summary. It may not cover all possible information. If you have questions about this medicine, talk to your doctor, pharmacist, or health care provider.  2019 Elsevier/[employer] Standard (2008-02-18 17:22:47  Leucovorin injection What is this medicine? LEUCOVORIN (loo koe VOR in) is used to prevent or treat the harmful effects of some medicines. This medicine is used  to treat anemia caused by a low amount of folic acid in the body. It is also used with 5-fluorouracil (5-FU) to treat colon cancer. This medicine may be used for other purposes; ask your health care provider or pharmacist if you have questions. What should I tell my health care provider before I take this medicine? They need to know if you have any of these conditions: -anemia from low  levels of vitamin B-12 in the blood -an unusual or allergic reaction to leucovorin, folic acid, other medicines, foods, dyes, or preservatives -pregnant or trying to get pregnant -breast-feeding How should I use this medicine? This medicine is for injection into a muscle or into a vein. It is given by a health care professional in a hospital or clinic setting. Talk to your pediatrician regarding the use of this medicine in children. Special care may be needed. Overdosage: If you think you have taken too much of this medicine contact a poison control center or emergency room at once. NOTE: This medicine is only for you. Do not share this medicine with others. What if I miss a dose? This does not apply. What may interact with this medicine? -capecitabine -fluorouracil -phenobarbital -phenytoin -primidone -trimethoprim-sulfamethoxazole This list may not describe all possible interactions. Give your health care provider a list of all the medicines, herbs, non-prescription drugs, or dietary supplements you use. Also tell them if you smoke, drink alcohol, or use illegal drugs. Some items may interact with your medicine. What should I watch for while using this medicine? Your condition will be monitored carefully while you are receiving this medicine. This medicine may increase the side effects of 5-fluorouracil, 5-FU. Tell your doctor or health care professional if you have diarrhea or mouth sores that do not get better or that get worse. What side effects may I notice from receiving this medicine? Side effects that you should report to your doctor or health care professional as soon as possible: -allergic reactions like skin rash, itching or hives, swelling of the face, lips, or tongue -breathing problems -fever, infection -mouth sores -unusual bleeding or bruising -unusually weak or tired Side effects that usually do not require medical attention (report to your doctor or health care  professional if they continue or are bothersome): -constipation or diarrhea -loss of appetite -nausea, vomiting This list may not describe all possible side effects. Call your doctor for medical advice about side effects. You may report side effects to FDA at 1-800-FDA-1088. Where should I keep my medicine? This drug is given in a hospital or clinic and will not be stored at home. NOTE: This sheet is a summary. It may not cover all possible information. If you have questions about this medicine, talk to your doctor, pharmacist, or health care provider.  2019 Elsevier/Gold Standard (2008-01-28 16:50:29)  Irinotecan injection What is this medicine? IRINOTECAN (ir in oh TEE kan ) is a chemotherapy drug. It is used to treat colon and rectal cancer. This medicine may be used for other purposes; ask your health care provider or pharmacist if you have questions. COMMON BRAND NAME(S): Camptosar What should I tell my health care provider before I take this medicine? They need to know if you have any of these conditions: -dehydration -diarrhea -infection (especially a virus infection such as chickenpox, cold sores, or herpes) -liver disease -low blood counts, like low white cell, platelet, or red cell counts -low levels of calcium, magnesium, or potassium in the blood -recent or ongoing radiation therapy -an unusual or  allergic reaction to irinotecan, other medicines, foods, dyes, or preservatives -pregnant or trying to get pregnant -breast-feeding How should I use this medicine? This drug is given as an infusion into a vein. It is administered in a hospital or clinic by a specially trained health care professional. Talk to your pediatrician regarding the use of this medicine in children. Special care may be needed. Overdosage: If you think you have taken too much of this medicine contact a poison control center or emergency room at once. NOTE: This medicine is only for you. Do not share this  medicine with others. What if I miss a dose? It is important not to miss your dose. Call your doctor or health care professional if you are unable to keep an appointment. What may interact with this medicine? This medicine may interact with the following medications: -antiviral medicines for HIV or AIDS -certain antibiotics like rifampin or rifabutin -certain medicines for fungal infections like itraconazole, ketoconazole, posaconazole, and voriconazole -certain medicines for seizures like carbamazepine, phenobarbital, phenotoin -clarithromycin -gemfibrozil -nefazodone -St. John's Wort This list may not describe all possible interactions. Give your health care provider a list of all the medicines, herbs, non-prescription drugs, or dietary supplements you use. Also tell them if you smoke, drink alcohol, or use illegal drugs. Some items may interact with your medicine. What should I watch for while using this medicine? Your condition will be monitored carefully while you are receiving this medicine. You will need important blood work done while you are taking this medicine. This drug may make you feel generally unwell. This is not uncommon, as chemotherapy can affect healthy cells as well as cancer cells. Report any side effects. Continue your course of treatment even though you feel ill unless your doctor tells you to stop. In some cases, you may be given additional medicines to help with side effects. Follow all directions for their use. You may get drowsy or dizzy. Do not drive, use machinery, or do anything that needs mental alertness until you know how this medicine affects you. Do not stand or sit up quickly, especially if you are an older patient. This reduces the risk of dizzy or fainting spells. Call your doctor or health care professional for advice if you get a fever, chills or sore throat, or other symptoms of a cold or flu. Do not treat yourself. This drug decreases your body's ability  to fight infections. Try to avoid being around people who are sick. This medicine may increase your risk to bruise or bleed. Call your doctor or health care professional if you notice any unusual bleeding. Be careful brushing and flossing your teeth or using a toothpick because you may get an infection or bleed more easily. If you have any dental work done, tell your dentist you are receiving this medicine. Avoid taking products that contain aspirin, acetaminophen, ibuprofen, naproxen, or ketoprofen unless instructed by your doctor. These medicines may hide a fever. Do not become pregnant while taking this medicine. Women should inform their doctor if they wish to become pregnant or think they might be pregnant. There is a potential for serious side effects to an unborn child. Talk to your health care professional or pharmacist for more information. Do not breast-feed an infant while taking this medicine. What side effects may I notice from receiving this medicine? Side effects that you should report to your doctor or health care professional as soon as possible: -allergic reactions like skin rash, itching or hives, swelling of the  face, lips, or tongue -chest pain -diarrhea -flushing, runny nose, sweating during infusion -low blood counts - this medicine may decrease the number of white blood cells, red blood cells and platelets. You may be at increased risk for infections and bleeding. -nausea, vomiting -pain, swelling, warmth in the leg -signs of decreased platelets or bleeding - bruising, pinpoint red spots on the skin, black, tarry stools, blood in the urine -signs of infection - fever or chills, cough, sore throat, pain or difficulty passing urine -signs of decreased red blood cells - unusually weak or tired, fainting spells, lightheadedness Side effects that usually do not require medical attention (report to your doctor or health care professional if they continue or are  bothersome): -constipation -hair loss -headache -loss of appetite -mouth sores -stomach pain This list may not describe all possible side effects. Call your doctor for medical advice about side effects. You may report side effects to FDA at 1-800-FDA-1088. Where should I keep my medicine? This drug is given in a hospital or clinic and will not be stored at home. NOTE: This sheet is a summary. It may not cover all possible information. If you have questions about this medicine, talk to your doctor, pharmacist, or health care provider.  2019 Else[employer]/Gold Standard (2017-10-09 15:02:58)  Fluorouracil, 5-FU injection What is this medicine? FLUOROURACIL, 5-FU (flure oh YOOR a sil) is a chemotherapy drug. It slows the growth of cancer cells. This medicine is used to treat many types of cancer like breast cancer, colon or rectal cancer, pancreatic cancer, and stomach cancer. This medicine may be used for other purposes; ask your health care provider or pharmacist if you have questions. COMMON BRAND NAME(S): Adrucil What should I tell my health care provider before I take this medicine? They need to know if you have any of these conditions: -blood disorders -dihydropyrimidine dehydrogenase (DPD) deficiency -infection (especially a virus infection such as chickenpox, cold sores, or herpes) -kidney disease -liver disease -malnourished, poor nutrition -recent or ongoing radiation therapy -an unusual or allergic reaction to fluorouracil, other chemotherapy, other medicines, foods, dyes, or preservatives -pregnant or trying to get pregnant -breast-feeding How should I use this medicine? This drug is given as an infusion or injection into a vein. It is administered in a hospital or clinic by a specially trained health care professional. Talk to your pediatrician regarding the use of this medicine in children. Special care may be needed. Overdosage: If you think you have taken too much of this  medicine contact a poison control center or emergency room at once. NOTE: This medicine is only for you. Do not share this medicine with others. What if I miss a dose? It is important not to miss your dose. Call your doctor or health care professional if you are unable to keep an appointment. What may interact with this medicine? -allopurinol -cimetidine -dapsone -digoxin -hydroxyurea -leucovorin -levamisole -medicines for seizures like ethotoin, fosphenytoin, phenytoin -medicines to increase blood counts like filgrastim, pegfilgrastim, sargramostim -medicines that treat or prevent blood clots like warfarin, enoxaparin, and dalteparin -methotrexate -metronidazole -pyrimethamine -some other chemotherapy drugs like busulfan, cisplatin, estramustine, vinblastine -trimethoprim -trimetrexate -vaccines Talk to your doctor or health care professional before taking any of these medicines: -acetaminophen -aspirin -ibuprofen -ketoprofen -naproxen This list may not describe all possible interactions. Give your health care provider a list of all the medicines, herbs, non-prescription drugs, or dietary supplements you use. Also tell them if you smoke, drink alcohol, or use illegal drugs. Some items may interact  with your medicine. What should I watch for while using this medicine? Visit your doctor for checks on your progress. This drug may make you feel generally unwell. This is not uncommon, as chemotherapy can affect healthy cells as well as cancer cells. Report any side effects. Continue your course of treatment even though you feel ill unless your doctor tells you to stop. In some cases, you may be given additional medicines to help with side effects. Follow all directions for their use. Call your doctor or health care professional for advice if you get a fever, chills or sore throat, or other symptoms of a cold or flu. Do not treat yourself. This drug decreases your body's ability to fight  infections. Try to avoid being around people who are sick. This medicine may increase your risk to bruise or bleed. Call your doctor or health care professional if you notice any unusual bleeding. Be careful brushing and flossing your teeth or using a toothpick because you may get an infection or bleed more easily. If you have any dental work done, tell your dentist you are receiving this medicine. Avoid taking products that contain aspirin, acetaminophen, ibuprofen, naproxen, or ketoprofen unless instructed by your doctor. These medicines may hide a fever. Do not become pregnant while taking this medicine. Women should inform their doctor if they wish to become pregnant or think they might be pregnant. There is a potential for serious side effects to an unborn child. Talk to your health care professional or pharmacist for more information. Do not breast-feed an infant while taking this medicine. Men should inform their doctor if they wish to father a child. This medicine may lower sperm counts. Do not treat diarrhea with over the counter products. Contact your doctor if you have diarrhea that lasts more than 2 days or if it is severe and watery. This medicine can make you more sensitive to the sun. Keep out of the sun. If you cannot avoid being in the sun, wear protective clothing and use sunscreen. Do not use sun lamps or tanning beds/booths. What side effects may I notice from receiving this medicine? Side effects that you should report to your doctor or health care professional as soon as possible: -allergic reactions like skin rash, itching or hives, swelling of the face, lips, or tongue -low blood counts - this medicine may decrease the number of white blood cells, red blood cells and platelets. You may be at increased risk for infections and bleeding. -signs of infection - fever or chills, cough, sore throat, pain or difficulty passing urine -signs of decreased platelets or bleeding - bruising,  pinpoint red spots on the skin, black, tarry stools, blood in the urine -signs of decreased red blood cells - unusually weak or tired, fainting spells, lightheadedness -breathing problems -changes in vision -chest pain -mouth sores -nausea and vomiting -pain, swelling, redness at site where injected -pain, tingling, numbness in the hands or feet -redness, swelling, or sores on hands or feet -stomach pain -unusual bleeding Side effects that usually do not require medical attention (report to your doctor or health care professional if they continue or are bothersome): -changes in finger or toe nails -diarrhea -dry or itchy skin -hair loss -headache -loss of appetite -sensitivity of eyes to the light -stomach upset -unusually teary eyes This list may not describe all possible side effects. Call your doctor for medical advice about side effects. You may report side effects to FDA at 1-800-FDA-1088. Where should I keep my medicine?  This drug is given in a hospital or clinic and will not be stored at home. NOTE: This sheet is a summary. It may not cover all possible information. If you have questions about this medicine, talk to your doctor, pharmacist, or health care provider.  2019 Elsevier/Gold Standard (2007-11-27 13:53:16)

## 2018-11-29 ENCOUNTER — Telehealth: Payer: Self-pay | Admitting: Hematology

## 2018-11-29 ENCOUNTER — Other Ambulatory Visit: Payer: Self-pay | Admitting: Medical Oncology

## 2018-11-29 NOTE — Telephone Encounter (Signed)
Scheduled appt for early next week via webex per sch msg. Called patient. No answer. Left msg advising about webex and not to come to facility for the appt.

## 2018-11-30 ENCOUNTER — Other Ambulatory Visit: Payer: Self-pay

## 2018-11-30 ENCOUNTER — Inpatient Hospital Stay: Payer: BLUE CROSS/BLUE SHIELD

## 2018-11-30 VITALS — BP 136/102 | HR 92 | Temp 98.1°F | Resp 18

## 2018-11-30 DIAGNOSIS — C252 Malignant neoplasm of tail of pancreas: Secondary | ICD-10-CM | POA: Diagnosis not present

## 2018-11-30 MED ORDER — PEGFILGRASTIM-CBQV 6 MG/0.6ML ~~LOC~~ SOSY
6.0000 mg | PREFILLED_SYRINGE | Freq: Once | SUBCUTANEOUS | Status: AC
  Administered 2018-11-30: 6 mg via SUBCUTANEOUS

## 2018-11-30 MED ORDER — PEGFILGRASTIM-CBQV 6 MG/0.6ML ~~LOC~~ SOSY
PREFILLED_SYRINGE | SUBCUTANEOUS | Status: AC
  Filled 2018-11-30: qty 0.6

## 2018-11-30 MED ORDER — HEPARIN SOD (PORK) LOCK FLUSH 100 UNIT/ML IV SOLN
500.0000 [IU] | Freq: Once | INTRAVENOUS | Status: AC | PRN
  Administered 2018-11-30: 500 [IU]
  Filled 2018-11-30: qty 5

## 2018-11-30 MED ORDER — SODIUM CHLORIDE 0.9% FLUSH
10.0000 mL | INTRAVENOUS | Status: DC | PRN
  Administered 2018-11-30: 10 mL
  Filled 2018-11-30: qty 10

## 2018-11-30 NOTE — Patient Instructions (Signed)
Pegfilgrastim injection What is this medicine? PEGFILGRASTIM (PEG fil gra stim) is a long-acting granulocyte colony-stimulating factor that stimulates the growth of neutrophils, a type of white blood cell important in the body's fight against infection. It is used to reduce the incidence of fever and infection in patients with certain types of cancer who are receiving chemotherapy that affects the bone marrow, and to increase survival after being exposed to high doses of radiation. This medicine may be used for other purposes; ask your health care provider or pharmacist if you have questions. COMMON BRAND NAME(S): Domenic Moras, UDENYCA What should I tell my health care provider before I take this medicine? They need to know if you have any of these conditions: -kidney disease -latex allergy -ongoing radiation therapy -sickle cell disease -skin reactions to acrylic adhesives (On-Body Injector only) -an unusual or allergic reaction to pegfilgrastim, filgrastim, other medicines, foods, dyes, or preservatives -pregnant or trying to get pregnant -breast-feeding How should I use this medicine? This medicine is for injection under the skin. If you get this medicine at home, you will be taught how to prepare and give the pre-filled syringe or how to use the On-body Injector. Refer to the patient Instructions for Use for detailed instructions. Use exactly as directed. Tell your healthcare provider immediately if you suspect that the On-body Injector may not have performed as intended or if you suspect the use of the On-body Injector resulted in a missed or partial dose. It is important that you put your used needles and syringes in a special sharps container. Do not put them in a trash can. If you do not have a sharps container, call your pharmacist or healthcare provider to get one. Talk to your pediatrician regarding the use of this medicine in children. While this drug may be prescribed for  selected conditions, precautions do apply. Overdosage: If you think you have taken too much of this medicine contact a poison control center or emergency room at once. NOTE: This medicine is only for you. Do not share this medicine with others. What if I miss a dose? It is important not to miss your dose. Call your doctor or health care professional if you miss your dose. If you miss a dose due to an On-body Injector failure or leakage, a new dose should be administered as soon as possible using a single prefilled syringe for manual use. What may interact with this medicine? Interactions have not been studied. Give your health care provider a list of all the medicines, herbs, non-prescription drugs, or dietary supplements you use. Also tell them if you smoke, drink alcohol, or use illegal drugs. Some items may interact with your medicine. This list may not describe all possible interactions. Give your health care provider a list of all the medicines, herbs, non-prescription drugs, or dietary supplements you use. Also tell them if you smoke, drink alcohol, or use illegal drugs. Some items may interact with your medicine. What should I watch for while using this medicine? You may need blood work done while you are taking this medicine. If you are going to need a MRI, CT scan, or other procedure, tell your doctor that you are using this medicine (On-Body Injector only). What side effects may I notice from receiving this medicine? Side effects that you should report to your doctor or health care professional as soon as possible: -allergic reactions like skin rash, itching or hives, swelling of the face, lips, or tongue -back pain -dizziness -fever -  pain, redness, or irritation at site where injected -pinpoint red spots on the skin -red or dark-brown urine -shortness of breath or breathing problems -stomach or side pain, or pain at the shoulder -swelling -tiredness -trouble passing urine or  change in the amount of urine Side effects that usually do not require medical attention (report to your doctor or health care professional if they continue or are bothersome): -bone pain -muscle pain This list may not describe all possible side effects. Call your doctor for medical advice about side effects. You may report side effects to FDA at 1-800-FDA-1088. Where should I keep my medicine? Keep out of the reach of children. If you are using this medicine at home, you will be instructed on how to store it. Throw away any unused medicine after the expiration date on the label. NOTE: This sheet is a summary. It may not cover all possible information. If you have questions about this medicine, talk to your doctor, pharmacist, or health care provider.  2019 Elsevier/Gold Standard (2017-10-29 16:57:08) [facility] Discharge Instructions for Patients Receiving Chemotherapy  Today you completed the following chemotherapy agents 55fu. To help prevent nausea and vomiting after your treatment, we encourage you to take your nausea medication   If you develop nausea and vomiting that is not controlled by your nausea medication, call the clinic.   BELOW ARE SYMPTOMS THAT SHOULD BE REPORTED IMMEDIATELY:  *FEVER GREATER THAN 100.5 F  *CHILLS WITH OR WITHOUT FEVER  NAUSEA AND VOMITING THAT IS NOT CONTROLLED WITH YOUR NAUSEA MEDICATION  *UNUSUAL SHORTNESS OF BREATH  *UNUSUAL BRUISING OR BLEEDING  TENDERNESS IN MOUTH AND THROAT WITH OR WITHOUT PRESENCE OF ULCERS  *URINARY PROBLEMS  *BOWEL PROBLEMS  UNUSUAL RASH Items with * indicate a potential emergency and should be followed up as soon as possible.  Feel free to call the clinic should you have any questions or concerns. The clinic phone number is [phone].  Please show the [place] at check-in to the Emergency Department and triage nurse.

## 2018-12-02 ENCOUNTER — Telehealth: Payer: Self-pay | Admitting: Nurse Practitioner

## 2018-12-02 NOTE — Telephone Encounter (Signed)
Called regarding upcoming Webex appointment, left patient a voicemail and e-mail sent.

## 2018-12-03 ENCOUNTER — Telehealth: Payer: Self-pay | Admitting: Nurse Practitioner

## 2018-12-03 ENCOUNTER — Telehealth: Payer: Self-pay | Admitting: *Deleted

## 2018-12-03 ENCOUNTER — Inpatient Hospital Stay (HOSPITAL_BASED_OUTPATIENT_CLINIC_OR_DEPARTMENT_OTHER): Payer: BLUE CROSS/BLUE SHIELD | Admitting: Nurse Practitioner

## 2018-12-03 DIAGNOSIS — R634 Abnormal weight loss: Secondary | ICD-10-CM

## 2018-12-03 DIAGNOSIS — R110 Nausea: Secondary | ICD-10-CM

## 2018-12-03 DIAGNOSIS — C787 Secondary malignant neoplasm of liver and intrahepatic bile duct: Secondary | ICD-10-CM

## 2018-12-03 DIAGNOSIS — C252 Malignant neoplasm of tail of pancreas: Secondary | ICD-10-CM

## 2018-12-03 DIAGNOSIS — Z79899 Other long term (current) drug therapy: Secondary | ICD-10-CM

## 2018-12-03 NOTE — Telephone Encounter (Signed)
No call to [employer] today.  WebEx F/U today with provider post first chemotherapy on 11-28-2018.

## 2018-12-03 NOTE — Progress Notes (Signed)
[place]   [phone] Fax:(336) [phone]   Virtual Follow up Note   Patient Care Team: Leonard Downing, MD as PCP - General (Family Medicine) 12/03/2018  CHIEF COMPLAINT: Follow-up pancreatic cancer  I connected with the patient Vincent [name] via WebEx on 12/03/2010 at 1126.  I verified speaking with the correct patient via 2 identifiers. He was accompanied on the phone by his wife Vincent [name]. Despite using webex program I was not able to visualize the patient or his wife.  I discussed the limitations, risks, security and privacy concerns of performing an evaluation and management service by telephone and the availability of in person appointments.  I explained there may be a charge associated with today's visit.  The patient expressed understanding and agreed to proceed.  Patient location: home Provider location: Office   SUMMARY OF ONCOLOGIC HISTORY: Oncology History   Cancer Staging Pancreatic cancer [facility]) Staging form: Exocrine Pancreas, AJCC 8th Edition - Clinical stage from 10/29/2018: Stage IV (cT3, cN0, cM1) - Signed by Truitt Merle, MD on 11/05/2018       Pancreatic cancer ([place])   10/09/2018 Procedure    Upper Endoscopy by. Dr Paulita Fujita 10/29/18 IMPRESSION - There was no evidence of significant pathology in the left lobe of the liver. - There was no sign of significant pathology in the gallbladder. - Many abnormal lymph nodes were visualized in the splenic region (level 19), perigastric region and peripancreatic region. - A mass was identified in the pancreatic tail. This was staged T4 N2 Mx by endosonographic criteria. Fine needle aspiration performed.    10/10/2018 Imaging    CT Abdomen 10/10/18 IMPRESSION: 1. Uncomplicated acute diverticulitis of the proximal sigmoid colon. 2. Ill-defined low-density in the pancreatic tail measuring at least 4.7 cm, with possible punctate peripheral calcifications. This is suspicious for pancreatic  neoplasm, however sequela of prior pancreatitis is also considered. Recommend further characterization with pancreatic protocol MRI when patient is able to tolerate breath hold technique. 3. 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 4. 16 mm cyst in the left lobe of the liver. Aortic Atherosclerosis (ICD10-I70.0).    10/16/2018 Imaging    MRI Abdomen at [facility] 10/16/18 IMPRESSION: 1. Mass in the tail the pancreas concerning for neoplasm. This contacts the spleen in the splenic hilum. 2. There is stranding in the fat extending from the tumor to the celiac axis and left adrenal gland. Cannot exclude infiltrating tumor. 3. 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 4. metastatic lesion in the liver    10/29/2018 Initial Biopsy    Diagnosis 10/29/18 FINE NEEDLE ASPIRATION, ENDOSCOPIC, PANCREAS TAIL(SPECIMEN 1 OF 1 COLLECTED 10/29/18): MALIGNANT CELLS PRESENT, CONSISTENT WITH ADENOCARCINOMA. SEE COMMENT. COMMENT: THERE IS INSUFFICIENT TUMOR PRESENT FOR ADDITIONAL STUDIES.    10/29/2018 Cancer Staging    Staging form: Exocrine Pancreas, AJCC 8th Edition - Clinical stage from 10/29/2018: Stage IV (cT3, cN0, cM1) - Signed by Truitt Merle, MD on 11/05/2018    11/04/2018 Initial Diagnosis    Pancreatic cancer ([place])    11/27/2018 Imaging    CT Chest  IMPRESSION: 1.  No acute process or evidence of metastatic disease in the chest. 2. Locally advanced pancreatic carcinoma, as detailed previously. Cannot exclude superimposed pancreatitis. 3. New and enlarging liver lesions, suspicious for hepatic metastasis. 4. Aortic atherosclerosis (ICD10-I70.0), coronary artery atherosclerosis and emphysema (ICD10-J43.9).    11/28/2018 -  Chemotherapy    First-Line FOLFIRINOX q2weeks starting 11/28/18  Current therapy: First-line FOLFIRINOX every 2 weeks, started 11/28/2018  Interval history: Vincent [name] presents via WebEx for virtual  toxicity check.  Completed cycle 1 FOLFIRINOX on 11/28/2018, with udenyca on day 3/pump DC.  He had intermittent nausea and dry heaves for 3 days following treatment, had little appetite.  He began taking Compazine on the night of treatment which provided adequate relief.  Zofran did not help much.  By day 4 he began eating better with small frequent meals.  He drinks protein shakes, supplements, and flavored water.  He noted intermittent cold sensitivity which was manageable, hands tingle slightly when he touches the metal bedpost.  Otherwise denies numbness or tingling.  No mouth sores.  Has mild fatigue and weakness but continues to walk outside daily.  He remains able to function well. Abdominal pain is "better".  He continues to have back pain 6-7 out of 10 when it flares.  Takes 1-2 tramadol alternates with Tylenol, this regimen works for him.  He also sits in a jetted tub periodically which helps his pain, he is very careful to cool off before getting out of the tub and dangles on the edge before standing.  His wife helps him, and uses a cane PRN.  He does not feel he experienced bone pain from Udenyca, did not take Claritin.  Blood pressure has been normal at home.  Most recent BG reading 167, nonfasting.  Otherwise denies lightheadedness, dizziness, fever, cough, dyspnea, chest pain, leg swelling, constipation, or diarrhea.   REVIEW OF SYSTEMS:   Constitutional: Denies fevers, chills (+) weight loss (+) fatigue Ears, nose, mouth, throat, and face: Denies mucositis or sore throat Respiratory: Denies cough, dyspnea or wheezes Cardiovascular: Denies palpitation, chest discomfort or lower extremity swelling Gastrointestinal:  Denies constipation, diarrhea, heartburn or change in bowel habits.  Denies hematochezia (+) intermittent nausea (+) dry heaves (+) abdominal pain, improved Skin: Denies abnormal skin rashes Lymphatics: Denies new lymphadenopathy or easy bruising Neurological:(+) mild generalized  weaknesses (+) mild cold sensitivity Behavioral/Psych: Mood is stable, no new changes  All other systems were reviewed with the patient and are negative.  MEDICAL HISTORY:  Past Medical History:  Diagnosis Date  . Diabetes mellitus without complication ([place])    pt. states that he is not diabeticalthought he takes metformin  . Eye hemorrhage    right  . Hypertension     SURGICAL HISTORY: Past Surgical History:  Procedure Laterality Date  . ESOPHAGOGASTRODUODENOSCOPY (EGD) WITH PROPOFOL N/A 10/29/2018   Procedure: ESOPHAGOGASTRODUODENOSCOPY (EGD) WITH PROPOFOL;  Surgeon: Arta Silence, MD;  Location: [facility];  Service: Endoscopy;  Laterality: N/A;  . EUS N/A 10/29/2018   Procedure: FULL UPPER ENDOSCOPIC ULTRASOUND (EUS) RADIAL;  Surgeon: Arta Silence, MD;  Location: [facility];  Service: Endoscopy;  Laterality: N/A;  . FINE NEEDLE ASPIRATION N/A 10/29/2018   Procedure: FINE NEEDLE ASPIRATION (FNA) LINEAR;  Surgeon: Arta Silence, MD;  Location: [facility];  Service: Endoscopy;  Laterality: N/A;  . IR IMAGING GUIDED PORT INSERTION  11/11/2018  . LUMBAR LAMINECTOMY/DECOMPRESSION MICRODISCECTOMY Left 10/11/2017   Procedure: Microlumbar decompression Lumbar Four-Five Left;  Surgeon: Susa Day, MD;  Location: [place];  Service: Orthopedics;  Laterality: Left;  Microlumbar decompression Lumbar Four-Five Left  . removal of wisdom teeth  1977    ALLERGIES:  has No Known Allergies.  MEDICATIONS:  Current Outpatient Medications  Medication Sig Dispense Refill  . Accu-Chek FastClix Lancets MISC daily. for testing    . aspirin EC 81 MG tablet Take 1 tablet (  81 mg total) by mouth daily after breakfast. Resume 4 days post-op    . lisinopril (PRINIVIL,ZESTRIL) 10 MG tablet Take 10 mg by mouth daily after breakfast.    . metFORMIN (GLUCOPHAGE) 1000 MG tablet TAKE 1 TABLET BY MOUTH AT BREAKFAST AND 1/2 TABLET AT SUPPER    . ondansetron (ZOFRAN) 8 MG tablet Take 1 tablet (8 mg total) by  mouth 2 (two) times daily as needed for refractory nausea / vomiting. Start on day 3 after chemotherapy. 30 tablet 1  . prochlorperazine (COMPAZINE) 10 MG tablet Take 1 tablet (10 mg total) by mouth every 6 (six) hours as needed (NAUSEA). 30 tablet 1  . simvastatin (ZOCOR) 40 MG tablet Take 20 mg by mouth every evening.    . traMADol (ULTRAM) 50 MG tablet Take 2 tablets (100 mg total) by mouth every 6 (six) hours as needed. 120 tablet 1  . HYDROcodone-acetaminophen (NORCO) 5-325 MG tablet Take 1 tablet by mouth every 6 (six) hours as needed for moderate pain. 30 tablet 0  . lidocaine-prilocaine (EMLA) cream Apply to affected area once 30 g 3  . oxyCODONE (OXY IR/ROXICODONE) 5 MG immediate release tablet Take 1-2 tablets (5-10 mg total) by mouth every 6 (six) hours as needed for severe pain. 45 tablet 0  . pantoprazole (PROTONIX) 40 MG tablet Take 1 tablet (40 mg total) by mouth daily. 30 tablet 0   No current facility-administered medications for this visit.    Performance status: 1 OBJECTIVE:  Patient appears well over the phone. Speech is clear and non-pressured.   VITALS SIGNS: None for this visit  LABORATORY:  None for this visit    LABORATORY DATA:  I have reviewed the data as listed    RADIOGRAPHIC STUDIES: I have personally reviewed the radiological images as listed and agreed with the findings in the report. No results found.   ASSESSMENT & PLAN: Vincent [name] is a 68 y.o. male with   1. Adenocarcinoma oftail of Pancreas, cT3N0Mx, with liver metastasis, diagnosed 10/2018 -s/p first line cycle 1 FOLFIRINOX on 11/28/18. He tolerated first cycle well overall, with some nausea and dry heaves. He will continue compazine PRN. Zofran does not help much. -Abdominal pain is improved, back pain manageable with tramadol and tylenol.  -we reviewed anti-emetics and symptom management, reviewed fever precautions, and discussed nutrition and hydration. He and his wife have spoken  with dietician over the phone.  -his wife who was not available by phone during last office visit had questions about CT chest, I reviewed results with Vincent [name] and answered their questions about general treatment trajectory.   -he will return 12/12/18 for lab, F/u, and cycle 2  2. Epigastricand LUQpainradiating to his back -secondary to pancreatic cancer  -abdominal pain is improved, back pain persists. Takes 1-2 tramadol and alternates with tylenol. This regimen appears adequate for now. He does not tolerate hydrocodone or oxycodone well, makes him emotional  -occasionally sits in jetted tub for relaxation and pain relief, we reviewed safety precautions with this  3. Lower appetite, Weight loss  -appetite improved on day 4 after chemo; he drinks protein shakes, supplements, and flavored water -followed by dietician via phone calls -I encouraged small, frequent high caloric meals   4. DM, HTN -On Metformin and Lisinopril  -BG 167 today when I spoke with him via webex -BP 107/86 at home  -we reviewed BG can be elevated by steroids and chemotherapy  PLAN: Return in 1 week for lab, f/u,  and cycle 2 FOLFIRINOX as scheduled    All questions were answered. The patient knows to call the clinic with any problems, questions or concerns to seek in-person evaluation.   I provided 30 minutes of non face-to-face time during this encounter.      Alla Feeling, NP 12/03/18

## 2018-12-03 NOTE — Telephone Encounter (Signed)
Scheduled appt per 4/28 los. °

## 2018-12-03 NOTE — Telephone Encounter (Signed)
-----   Message from Jesse Fall, [place] sent at 11/28/2018  5:27 PM EDT ----- Regarding: chemo f/u Feng-FOLFIRI-11/28/18 -tol well

## 2018-12-05 ENCOUNTER — Encounter: Payer: Self-pay | Admitting: Genetic Counselor

## 2018-12-05 ENCOUNTER — Telehealth: Payer: Self-pay | Admitting: *Deleted

## 2018-12-05 DIAGNOSIS — Z1379 Encounter for other screening for genetic and chromosomal anomalies: Secondary | ICD-10-CM | POA: Insufficient documentation

## 2018-12-09 ENCOUNTER — Other Ambulatory Visit: Payer: Self-pay

## 2018-12-09 ENCOUNTER — Other Ambulatory Visit: Payer: Self-pay | Admitting: Emergency Medicine

## 2018-12-09 ENCOUNTER — Telehealth: Payer: Self-pay

## 2018-12-09 ENCOUNTER — Inpatient Hospital Stay: Payer: BC Managed Care – PPO

## 2018-12-09 ENCOUNTER — Inpatient Hospital Stay: Payer: BC Managed Care – PPO | Attending: Hematology | Admitting: Medical

## 2018-12-09 VITALS — BP 110/85 | HR 94 | Temp 97.9°F | Resp 18 | Ht 76.0 in | Wt 224.7 lb

## 2018-12-09 DIAGNOSIS — K5732 Diverticulitis of large intestine without perforation or abscess without bleeding: Secondary | ICD-10-CM | POA: Insufficient documentation

## 2018-12-09 DIAGNOSIS — C787 Secondary malignant neoplasm of liver and intrahepatic bile duct: Secondary | ICD-10-CM

## 2018-12-09 DIAGNOSIS — I10 Essential (primary) hypertension: Secondary | ICD-10-CM | POA: Insufficient documentation

## 2018-12-09 DIAGNOSIS — I2510 Atherosclerotic heart disease of native coronary artery without angina pectoris: Secondary | ICD-10-CM | POA: Diagnosis not present

## 2018-12-09 DIAGNOSIS — D649 Anemia, unspecified: Secondary | ICD-10-CM | POA: Insufficient documentation

## 2018-12-09 DIAGNOSIS — R197 Diarrhea, unspecified: Secondary | ICD-10-CM | POA: Diagnosis not present

## 2018-12-09 DIAGNOSIS — I700 Atherosclerosis of aorta: Secondary | ICD-10-CM | POA: Insufficient documentation

## 2018-12-09 DIAGNOSIS — Z5111 Encounter for antineoplastic chemotherapy: Secondary | ICD-10-CM | POA: Insufficient documentation

## 2018-12-09 DIAGNOSIS — E860 Dehydration: Secondary | ICD-10-CM

## 2018-12-09 DIAGNOSIS — E876 Hypokalemia: Secondary | ICD-10-CM | POA: Insufficient documentation

## 2018-12-09 DIAGNOSIS — C252 Malignant neoplasm of tail of pancreas: Secondary | ICD-10-CM | POA: Insufficient documentation

## 2018-12-09 DIAGNOSIS — Z79899 Other long term (current) drug therapy: Secondary | ICD-10-CM | POA: Diagnosis not present

## 2018-12-09 DIAGNOSIS — R110 Nausea: Secondary | ICD-10-CM | POA: Diagnosis not present

## 2018-12-09 DIAGNOSIS — E119 Type 2 diabetes mellitus without complications: Secondary | ICD-10-CM | POA: Insufficient documentation

## 2018-12-09 DIAGNOSIS — Z7689 Persons encountering health services in other specified circumstances: Secondary | ICD-10-CM | POA: Insufficient documentation

## 2018-12-09 LAB — CBC WITH DIFFERENTIAL (CANCER CENTER ONLY)
Abs Immature Granulocytes: 0.05 10*3/uL (ref 0.00–0.07)
Basophils Absolute: 0 10*3/uL (ref 0.0–0.1)
Basophils Relative: 1 %
Eosinophils Absolute: 0.2 10*3/uL (ref 0.0–0.5)
Eosinophils Relative: 4 %
HCT: 40.6 % (ref 39.0–52.0)
Hemoglobin: 13.6 g/dL (ref 13.0–17.0)
Immature Granulocytes: 1 %
Lymphocytes Relative: 21 %
Lymphs Abs: 1 10*3/uL (ref 0.7–4.0)
MCH: 33.4 pg (ref 26.0–34.0)
MCHC: 33.5 g/dL (ref 30.0–36.0)
MCV: 99.8 fL (ref 80.0–100.0)
Monocytes Absolute: 0.6 10*3/uL (ref 0.1–1.0)
Monocytes Relative: 11 %
Neutro Abs: 3.1 10*3/uL (ref 1.7–7.7)
Neutrophils Relative %: 62 %
Platelet Count: 189 10*3/uL (ref 150–400)
RBC: 4.07 MIL/uL — ABNORMAL LOW (ref 4.22–5.81)
RDW: 12.3 % (ref 11.5–15.5)
WBC Count: 5 10*3/uL (ref 4.0–10.5)
nRBC: 0 % (ref 0.0–0.2)

## 2018-12-09 LAB — CMP (CANCER CENTER ONLY)
ALT: 28 U/L (ref 0–44)
AST: 15 U/L (ref 15–41)
Albumin: 3.8 g/dL (ref 3.5–5.0)
Alkaline Phosphatase: 92 U/L (ref 38–126)
Anion gap: 8 (ref 5–15)
BUN: 17 mg/dL (ref 8–23)
CO2: 24 mmol/L (ref 22–32)
Calcium: 9.1 mg/dL (ref 8.9–10.3)
Chloride: 105 mmol/L (ref 98–111)
Creatinine: 0.94 mg/dL (ref 0.61–1.24)
GFR, Est AFR Am: >60 mL/min (ref >60)
GFR, Estimated: >60 mL/min (ref >60)
Glucose, Bld: 128 mg/dL — ABNORMAL HIGH (ref 70–99)
Potassium: 3.5 mmol/L (ref 3.5–5.1)
Sodium: 137 mmol/L (ref 135–145)
Total Bilirubin: 0.4 mg/dL (ref 0.3–1.2)
Total Protein: 7.1 g/dL (ref 6.5–8.1)

## 2018-12-09 LAB — MAGNESIUM: Magnesium: 1.7 mg/dL (ref 1.7–2.4)

## 2018-12-09 MED ORDER — HEPARIN SOD (PORK) LOCK FLUSH 100 UNIT/ML IV SOLN
500.0000 [IU] | Freq: Once | INTRAVENOUS | Status: AC
  Administered 2018-12-09: 500 [IU]
  Filled 2018-12-09: qty 5

## 2018-12-09 MED ORDER — SODIUM CHLORIDE 0.9 % IV SOLN
Freq: Once | INTRAVENOUS | Status: AC
  Administered 2018-12-09: 11:00:00 via INTRAVENOUS
  Filled 2018-12-09: qty 250

## 2018-12-09 MED ORDER — SODIUM CHLORIDE 0.9% FLUSH
10.0000 mL | Freq: Once | INTRAVENOUS | Status: AC
  Administered 2018-12-09: 10 mL
  Filled 2018-12-09: qty 10

## 2018-12-09 NOTE — Telephone Encounter (Signed)
Spoke with Almira Bar concerning after hours message we received regarding diarrhea, nausea, took Imodium yesterday which has helped the diarrhea, still not eating and drinking, advised he needs to come in for IVF, she agreed and will get him here at 11:00 for labs and to be seen in [facility].

## 2018-12-09 NOTE — Progress Notes (Signed)
Pt presents with recent nausea and diarrhea that are now under control with medications.  However pt recently had single syncopal episode without injury or head trauma in bathroom, states he 'blacked out' and fell onto a pile of towels because he felt dizzy.

## 2018-12-09 NOTE — Progress Notes (Signed)
[facility] Progress Note   Vincent [name] [id number] 1950/09/09 68 y.o.  Vincent [name] is managed by Dr. Truitt Merle  Actively treated with chemotherapy/immunotherapy/hormonal therapy: yes  Current therapy: FOLFIRINOX with Udenyca support  Last treated: 11/28/2018 (cycle 1, day 1)  Next scheduled appointment with provider: 12/12/2018  Assessment: Plan:    Dehydration - Plan: 0.9 %  sodium chloride infusion, sodium chloride flush (NS) 0.9 % injection 10 mL, heparin lock flush 100 unit/mL  Nausea without vomiting  Diarrhea, unspecified type  Malignant neoplasm of tail of pancreas ([place])   Dehydration: Patient was given 1 L of normal saline IV today.  Nausea: The patient was encouraged to continue using Compazine as needed and Zofran before his treatments and 72 hours after his treatments.  A CBC and a chemistry panel returned showing no acute findings.  Diarrhea: The patient was encouraged to continue using Imodium as needed. A CBC and a chemistry panel returned showing no acute findings.  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  He is scheduled to return to clinic on 12/12/2018 for consideration of cycle 2 of chemotherapy.  Please see After Visit Summary for patient specific instructions.  Future Appointments  Date Time Provider [place]  12/12/2018  7:45 AM [facility] 4 [facility] None  12/12/2018  8:00 AM [facility] [place] FLUSH [facility] None  12/12/2018  8:15 AM Alla Feeling, NP [facility] None  12/12/2018  9:00 AM [facility] INFUSION [facility] None  12/14/2018  9:30 AM [facility] [place] FLUSH [facility] None  12/16/2018 11:15 AM Neff, Barbara L, RD [facility] None  12/26/2018  7:45 AM [facility] LAB 4 [facility] None  12/26/2018  8:00 AM [facility] [place] None  12/26/2018  8:30 AM Truitt Merle, MD [facility] None  12/26/2018   9:00 AM [facility] INFUSION [facility] None  12/28/2018 10:45 AM [facility] [place] FLUSH [facility] None  01/08/2019  8:00 AM [facility] LAB 1 [facility] None  01/08/2019  8:15 AM [facility] [place] FLUSH [facility] None  01/08/2019  8:45 AM Truitt Merle, MD [facility] None  01/08/2019  9:30 AM [facility] INFUSION [facility] None  01/10/2019 11:00 AM [facility] [place] FLUSH [facility] None  01/22/2019  8:45 AM [facility] LAB 2 [facility] None  01/22/2019  9:00 AM [facility] [place] FLUSH [facility] None  01/22/2019  9:30 AM Truitt Merle, MD [facility] None  01/22/2019 10:45 AM [facility] INFUSION [facility] None  01/24/2019 11:00 AM [facility] [place] FLUSH [facility] None  02/11/2019  7:45 AM Hayden Pedro, MD [facility] None    No orders of the defined types were placed in this encounter.      Subjective:   Patient ID:  Vincent [name] is a 68 y.o. (DOB 02-25-1951) male.  Chief Complaint:  Chief Complaint  Patient presents with  . Dehydration    HPI Vincent [name]   is a 68 year old male with a history of a metastatic pancreatic cancer with liver metastasis who is managed by Dr. Truitt Merle and is status post cycle 1 of FOLFIRINOX with Udenyca support which was dosed on 11/28/2018.  He presents to the office today with a report that he has been having nausea with no vomiting.  He had diarrhea yesterday on at least 3 occasions.  He contacted the office and was told to begin Imodium.  He noted improvement of his diarrhea after beginning Imodium.  He reports that he feels nausea when he tries to  eat or thinks about eating something.  He states that he is feeling better today than he felt yesterday especially after getting IV fluids.  He denies fevers, chills, sweats, headache or acute pain.  He reports that his left upper quadrant abdominal pain is better.  He is pain had radiated to left his back but is better today.  Medications: I have reviewed the patient's current medications.  Allergies: No Known Allergies   Past Medical History:  Diagnosis Date  . Diabetes mellitus without complication ([place])    pt. states that he is not diabeticalthought he takes metformin  . Eye hemorrhage    right  . Hypertension     Past Surgical History:  Procedure Laterality Date  . ESOPHAGOGASTRODUODENOSCOPY (EGD) WITH PROPOFOL N/A 10/29/2018   Procedure: ESOPHAGOGASTRODUODENOSCOPY (EGD) WITH PROPOFOL;  Surgeon: Arta Silence, MD;  Location: [facility];  Service: Endoscopy;  Laterality: N/A;  . EUS N/A 10/29/2018   Procedure: FULL UPPER ENDOSCOPIC ULTRASOUND (EUS) RADIAL;  Surgeon: Arta Silence, MD;  Location: [facility];  Service: Endoscopy;  Laterality: N/A;  . FINE NEEDLE ASPIRATION N/A 10/29/2018   Procedure: FINE NEEDLE ASPIRATION (FNA) LINEAR;  Surgeon: Arta Silence, MD;  Location: [facility];  Service: Endoscopy;  Laterality: N/A;  . IR IMAGING GUIDED PORT INSERTION  11/11/2018  . LUMBAR LAMINECTOMY/DECOMPRESSION MICRODISCECTOMY Left 10/11/2017   Procedure: Microlumbar decompression Lumbar Four-Five Left;  Surgeon: Susa Day, MD;  Location: [facility];  Service: Orthopedics;  Laterality: Left;  Microlumbar decompression Lumbar Four-Five Left  . removal of wisdom teeth  1977    Family History  Problem Relation Age of Onset  . Cancer Mother 37       ovarian cancer     Social History   Socioeconomic History  . Marital status: Married    Spouse name: Not on file  . Number of children: 4  . Years of education: Not on file  . Highest education level: Not on file  Occupational History  . Not on file  Social Needs  . Financial resource strain: Not on file  . Food insecurity:    Worry: Not on file    Inability: Not on file  . Transportation needs:    Medical: Not on file    Non-medical: Not on file  Tobacco Use  . Smoking status: Never Smoker  . Smokeless tobacco: Never Used  Substance and Sexual Activity  . Alcohol use: Yes    Comment: rarely  . Drug use: No  . Sexual activity: Not on  file  Lifestyle  . Physical activity:    Days per week: Not on file    Minutes per session: Not on file  . Stress: Not on file  Relationships  . Social connections:    Talks on phone: Not on file    Gets together: Not on file    Attends religious service: Not on file    Active member of club or organization: Not on file    Attends meetings of clubs or organizations: Not on file    Relationship status: Not on file  . Intimate partner violence:    Fear of current or ex partner: Not on file    Emotionally abused: Not on file    Physically abused: Not on file    Forced sexual activity: Not on file  Other Topics Concern  . Not on file  Social History Narrative  . Not on file    Past Medical History, Surgical history, Social history, and Family history  were reviewed and updated as appropriate.   Please see review of systems for further details on the patient's review from today.   Review of Systems:  Review of Systems  Constitutional: Positive for appetite change. Negative for chills, diaphoresis and fever.  HENT: Negative for trouble swallowing.   Respiratory: Negative for cough, chest tightness and shortness of breath.   Cardiovascular: Negative for chest pain, palpitations and leg swelling.  Gastrointestinal: Positive for abdominal pain, diarrhea and nausea. Negative for abdominal distention, blood in stool, constipation and vomiting.  Genitourinary: Negative for decreased urine volume and difficulty urinating.  Musculoskeletal: Positive for back pain.  Neurological: Negative for weakness.    Objective:   Physical Exam:  BP 110/85   Pulse 94   Temp 97.9 F (36.6 C) (Oral)   Resp 18   Ht 6\' 4"  (1.93 m)   Wt 224 lb 11.2 oz (101.9 kg)   SpO2 96%   BMI 27.35 kg/m  ECOG: 1  Physical Exam Constitutional:      General: He is not in acute distress.    Appearance: He is not diaphoretic.  HENT:     Head: Normocephalic and atraumatic.  Eyes:     General: No scleral  icterus.       Right eye: No discharge.        Left eye: No discharge.  Cardiovascular:     Rate and Rhythm: Normal rate and regular rhythm.     Heart sounds: Normal heart sounds. No murmur. No friction rub. No gallop.   Pulmonary:     Effort: Pulmonary effort is normal. No respiratory distress.     Breath sounds: Normal breath sounds. No wheezing or rales.  Skin:    General: Skin is warm and dry.     Findings: No erythema or rash.  Neurological:     Mental Status: He is alert.  Psychiatric:        Mood and Affect: Mood normal.        Behavior: Behavior normal.        Thought Content: Thought content normal.        Judgment: Judgment normal.     Lab Review:     Component Value Date/Time   NA 137 12/09/2018 1104   K 3.5 12/09/2018 1104   CL 105 12/09/2018 1104   CO2 24 12/09/2018 1104   GLUCOSE 128 (H) 12/09/2018 1104   BUN 17 12/09/2018 1104   CREATININE 0.94 12/09/2018 1104   CALCIUM 9.1 12/09/2018 1104   PROT 7.1 12/09/2018 1104   ALBUMIN 3.8 12/09/2018 1104   AST 15 12/09/2018 1104   ALT 28 12/09/2018 1104   ALKPHOS 92 12/09/2018 1104   BILITOT 0.4 12/09/2018 1104   GFRNONAA >60 12/09/2018 1104   GFRAA >60 12/09/2018 1104       Component Value Date/Time   WBC 5.0 12/09/2018 1104   WBC 8.1 11/11/2018 0954   RBC 4.07 (L) 12/09/2018 1104   HGB 13.6 12/09/2018 1104   HCT 40.6 12/09/2018 1104   PLT 189 12/09/2018 1104   MCV 99.8 12/09/2018 1104   MCH 33.4 12/09/2018 1104   MCHC 33.5 12/09/2018 1104   RDW 12.3 12/09/2018 1104   LYMPHSABS 1.0 12/09/2018 1104   MONOABS 0.6 12/09/2018 1104   EOSABS 0.2 12/09/2018 1104   BASOSABS 0.0 12/09/2018 1104   -------------------------------  Imaging from last 24 hours (if applicable):  Radiology interpretation: Ct Chest W Contrast  Result Date: 11/28/2018 CLINICAL DATA:  Staging of pancreatic  cancer; diagnosed in March. EXAM: CT CHEST WITH CONTRAST TECHNIQUE: Multidetector CT imaging of the chest was performed  during intravenous contrast administration. CONTRAST:  40mL OMNIPAQUE IOHEXOL 300 MG/ML  SOLN COMPARISON:  08/31/2017 chest radiograph. Abdominal CT of 10/10/2018. outside abdominal MRI of 10/16/2018 FINDINGS: Cardiovascular: A right Port-A-Cath with tip at low SVC. Aortic atherosclerosis. Tortuous thoracic aorta. Normal heart size, without pericardial effusion. Multivessel coronary artery atherosclerosis. No central pulmonary embolism, on this non-dedicated study. Mediastinum/Nodes: No supraclavicular adenopathy. No mediastinal or hilar adenopathy. Lungs/Pleura: No pleural fluid.  Mild centrilobular emphysema. No suspicious pulmonary nodule or mass. Upper Abdomen: Left hepatic lobe low-density lesion is ill-defined and better evaluated on prior MRI. 2.9 cm on image 129/2. Enlarged from the prior exams. There is also a right hepatic lobe hypoattenuating lesion of 1.9 cm on image 174/2. This is new since the prior CT. Gallbladder distension. Normal imaged portions of the spleen, stomach, adrenal glands, kidneys. Pancreatic body and tail infiltrative lesion with direct extension posteriorly, including surrounding the celiac on image 149/2 and adjacent the SMA on image 158/2. Mild peripancreatic interstitial thickening. Musculoskeletal: Moderate thoracic spondylosis. IMPRESSION: 1.  No acute process or evidence of metastatic disease in the chest. 2. Locally advanced pancreatic carcinoma, as detailed previously. Cannot exclude superimposed pancreatitis. 3. New and enlarging liver lesions, suspicious for hepatic metastasis. 4. Aortic atherosclerosis (ICD10-I70.0), coronary artery atherosclerosis and emphysema (ICD10-J43.9). Electronically Signed   By: Abigail Miyamoto M.D.   On: 11/28/2018 08:54   Ir Imaging Guided Port Insertion  Result Date: 11/11/2018 CLINICAL DATA:  Pancreatic carcinoma and need for porta cath prior to beginning chemotherapy. EXAM: IMPLANTED PORT A CATH PLACEMENT WITH ULTRASOUND AND FLUOROSCOPIC  GUIDANCE ANESTHESIA/SEDATION: 2.0 mg IV Versed; 100 mcg IV Fentanyl Total Moderate Sedation Time:  38 minutes The patient's level of consciousness and physiologic status were continuously monitored during the procedure by Radiology nursing. Additional Medications: 2 g IV Ancef. FLUOROSCOPY TIME:  1 minutes and 54 seconds.  40.1 mGy. PROCEDURE: The procedure, risks, benefits, and alternatives were explained to the patient. Questions regarding the procedure were encouraged and answered. The patient understands and consents to the procedure. A time-out was performed prior to initiating the procedure. Ultrasound was utilized to confirm patency of the right internal jugular vein. The right neck and chest were prepped with chlorhexidine in a sterile fashion, and a sterile drape was applied covering the operative field. Maximum barrier sterile technique with sterile gowns and gloves were used for the procedure. Local anesthesia was provided with 1% lidocaine. After creating a small venotomy incision, a 21 gauge needle was advanced into the right internal jugular vein under direct, real-time ultrasound guidance. Ultrasound image documentation was performed. After securing guidewire access, an 8 Fr dilator was placed. A J-wire was kinked to measure appropriate catheter length. A subcutaneous port pocket was then created along the upper chest wall utilizing sharp and blunt dissection. Portable cautery was utilized. The pocket was irrigated with sterile saline. A single lumen power injectable port was chosen for placement. The 8 Fr catheter was tunneled from the port pocket site to the venotomy incision. The port was placed in the pocket. External catheter was trimmed to appropriate length based on guidewire measurement. At the venotomy, an 8 Fr peel-away sheath was placed over a guidewire. The catheter was then placed through the sheath and the sheath removed. Final catheter positioning was confirmed and documented with a  fluoroscopic spot image. The port was accessed with a needle and aspirated and flushed  with heparinized saline. The access needle was removed. The venotomy and port pocket incisions were closed with subcutaneous 3-0 Monocryl and subcuticular 4-0 Vicryl. Dermabond was applied to both incisions. COMPLICATIONS: COMPLICATIONS None FINDINGS: After catheter placement, the tip lies at the cavo-atrial junction. The catheter aspirates normally and is ready for immediate use. IMPRESSION: Placement of single lumen port a cath via right internal jugular vein. The catheter tip lies at the cavo-atrial junction. A power injectable port a cath was placed and is ready for immediate use. Electronically Signed   By: Aletta Edouard M.D.   On: 11/11/2018 14:15

## 2018-12-09 NOTE — Patient Instructions (Signed)

## 2018-12-10 ENCOUNTER — Telehealth: Payer: Self-pay | Admitting: Medical

## 2018-12-10 ENCOUNTER — Other Ambulatory Visit: Payer: BLUE CROSS/BLUE SHIELD

## 2018-12-10 NOTE — Telephone Encounter (Signed)
No los per 5/4. °

## 2018-12-11 NOTE — Progress Notes (Signed)
[place]   Telephone:([phone]) [phone] Fax:(336) [phone]   Clinic Follow up Note   Patient Care Team: Leonard Downing, MD as PCP - General (Family Medicine) 12/12/2018  CHIEF COMPLAINT: f/u pancreatic cancer   SUMMARY OF ONCOLOGIC HISTORY: Oncology History   Cancer Staging Pancreatic cancer [facility]) Staging form: Exocrine Pancreas, AJCC 8th Edition - Clinical stage from 10/29/2018: Stage IV (cT3, cN0, cM1) - Signed by Truitt Merle, MD on 11/05/2018       Pancreatic cancer ([place])   10/09/2018 Procedure    Upper Endoscopy by. Dr Paulita Fujita 10/29/18 IMPRESSION - There was no evidence of significant pathology in the left lobe of the liver. - There was no sign of significant pathology in the gallbladder. - Many abnormal lymph nodes were visualized in the splenic region (level 19), perigastric region and peripancreatic region. - A mass was identified in the pancreatic tail. This was staged T4 N2 Mx by endosonographic criteria. Fine needle aspiration performed.    10/10/2018 Imaging    CT Abdomen 10/10/18 IMPRESSION: 1. Uncomplicated acute diverticulitis of the proximal sigmoid colon. 2. Ill-defined low-density in the pancreatic tail measuring at least 4.7 cm, with possible punctate peripheral calcifications. This is suspicious for pancreatic neoplasm, however sequela of prior pancreatitis is also considered. Recommend further characterization with pancreatic protocol MRI when patient is able to tolerate breath hold technique. 3. 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 4. 16 mm cyst in the left lobe of the liver. Aortic Atherosclerosis (ICD10-I70.0).    10/16/2018 Imaging    MRI Abdomen at [facility] 10/16/18 IMPRESSION: 1. Mass in the tail the pancreas concerning for neoplasm. This contacts the spleen in the splenic hilum. 2. There is stranding in the fat extending from the tumor to the celiac axis and left  adrenal gland. Cannot exclude infiltrating tumor. 3. 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 4. metastatic lesion in the liver    10/29/2018 Initial Biopsy    Diagnosis 10/29/18 FINE NEEDLE ASPIRATION, ENDOSCOPIC, PANCREAS TAIL(SPECIMEN 1 OF 1 COLLECTED 10/29/18): MALIGNANT CELLS PRESENT, CONSISTENT WITH ADENOCARCINOMA. SEE COMMENT. COMMENT: THERE IS INSUFFICIENT TUMOR PRESENT FOR ADDITIONAL STUDIES.    10/29/2018 Cancer Staging    Staging form: Exocrine Pancreas, AJCC 8th Edition - Clinical stage from 10/29/2018: Stage IV (cT3, cN0, cM1) - Signed by Truitt Merle, MD on 11/05/2018    11/04/2018 Initial Diagnosis    Pancreatic cancer ([place])    11/27/2018 Imaging    CT Chest  IMPRESSION: 1.  No acute process or evidence of metastatic disease in the chest. 2. Locally advanced pancreatic carcinoma, as detailed previously. Cannot exclude superimposed pancreatitis. 3. New and enlarging liver lesions, suspicious for hepatic metastasis. 4. Aortic atherosclerosis (ICD10-I70.0), coronary artery atherosclerosis and emphysema (ICD10-J43.9).    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  The Common Hereditary Gene Panel offered by [facility] includes sequencing and/or deletion duplication testing of the following 48 genes: APC, ATM, AXIN2, BARD1, BMPR1A, BRCA1, BRCA2, BRIP1, CDH1, CDK4, CDKN2A (p14ARF), CDKN2A (p16INK4a), CHEK2, CTNNA1, DICER1, EPCAM (Deletion/duplication testing only), GREM1 (promoter region deletion/duplication testing only), KIT, MEN1, MLH1, MSH2, MSH3, MSH6, MUTYH, NBN, NF1, NHTL1, PALB2, PDGFRA, PMS2, POLD1, POLE, PTEN, RAD50, RAD51C, RAD51D, RNF43, SDHB, SDHC, SDHD, SMAD4, SMARCA4. STK11, TP53, TSC1, TSC2, and VHL.  The following  genes were evaluated for sequence changes only:  SDHA and HOXB13 c.251G>A variant only. The report date is December 04, 2018.,     CURRENT THERAPY: First-line FOLFIRINOX every 2 weeks, started 11/28/2018  INTERVAL HISTORY: Mr. Briski returns for follow up as scheduled. He completed cycle 1 FOLFIRINOX on 11/28/18. We spoke over the phone for a toxicity check 1 week after first treatment and he was recovering well from nausea, fatigue, and cold sensitivity. He was seen on 12/09/18 by Sandi Mealy, PA for dehydration related to nausea and 3-4 episodes of diarrhea. He received IVF and felt better.   Today he presents to clinic by himself for cycle 2 chemo. He had a good week. Continues to have mild fatigue but remains active, uses walking stick.  He is eating "good" but gets full quickly. He has burping after meals. He takes nausea pill before meals now, which has helped. Denies vomiting. Diarrhea resolved after taking imodium. Abdominal pain has improved, takes 2 tramadol in AM, tylenol during the day, and occasionally 1 tramadol in the evening. He did fall after getting out of tub too quickly. He usually sits for a while and stands slowly without any problem, but had to have urgent BM and he "passed out and fell." He denies injury or bleeding. Denies fever, cough, chest pain, dyspnea, swelling, bleeding, neuropathy, mucositis, sick contacts, or covid exposure.   REVIEW OF SYSTEMS:   Constitutional: Denies fevers, chills (+) weight change (+) mild fatigue  Ears, nose, mouth, throat, and face: Denies mucositis or sore throat Respiratory: Denies cough, dyspnea or wheezes Cardiovascular: Denies palpitation, chest discomfort or lower extremity swelling Gastrointestinal:  Denies vomiting, constipation, GI bleeding (+) abdominal pain, improving (+) burping (+) dry heaves (+) diarrhea resolved with imodium  Skin: Denies abnormal skin rashes Lymphatics: Denies new lymphadenopathy or easy bruising Neurological:Denies numbness, tingling or new weaknesses    Behavioral/Psych: Mood is stable, no new changes  All other systems were reviewed with the patient and are negative.  MEDICAL HISTORY:  Past Medical History:  Diagnosis Date   Diabetes mellitus without complication ([place])    pt. states that he is not diabeticalthought he takes metformin   Eye hemorrhage    right   Hypertension     SURGICAL HISTORY: Past Surgical History:  Procedure Laterality Date   ESOPHAGOGASTRODUODENOSCOPY (EGD) WITH PROPOFOL N/A 10/29/2018   Procedure: ESOPHAGOGASTRODUODENOSCOPY (EGD) WITH PROPOFOL;  Surgeon: Arta Silence, MD;  Location: [facility];  Service: Endoscopy;  Laterality: N/A;   EUS N/A 10/29/2018   Procedure: FULL UPPER ENDOSCOPIC ULTRASOUND (EUS) RADIAL;  Surgeon: Arta Silence, MD;  Location: [facility];  Service: Endoscopy;  Laterality: N/A;   FINE NEEDLE ASPIRATION N/A 10/29/2018   Procedure: FINE NEEDLE ASPIRATION (FNA) LINEAR;  Surgeon: Arta Silence, MD;  Location: [facility];  Service: Endoscopy;  Laterality: N/A;   IR IMAGING GUIDED PORT INSERTION  11/11/2018   LUMBAR LAMINECTOMY/DECOMPRESSION MICRODISCECTOMY Left 10/11/2017   Procedure: Microlumbar decompression Lumbar Four-Five Left;  Surgeon: Susa Day, MD;  Location: [place];  Service: Orthopedics;  Laterality: Left;  Microlumbar decompression Lumbar Four-Five Left   removal of wisdom teeth  1977    I have reviewed the social history and family history with the patient and they are unchanged from previous note.  ALLERGIES:  has No Known Allergies.  MEDICATIONS:  Current Outpatient Medications  Medication Sig Dispense Refill   Accu-Chek FastClix Lancets MISC daily. for testing     aspirin EC 81 MG tablet Take 1 tablet (81  mg total) by mouth daily after breakfast. Resume 4 days post-op     lidocaine-prilocaine (EMLA) cream Apply to affected area once 30 g 3   lisinopril (PRINIVIL,ZESTRIL) 10 MG tablet Take 10 mg by mouth daily after breakfast.     metFORMIN  (GLUCOPHAGE) 1000 MG tablet TAKE 1 TABLET BY MOUTH AT BREAKFAST AND 1/2 TABLET AT SUPPER     ondansetron (ZOFRAN) 8 MG tablet Take 1 tablet (8 mg total) by mouth 2 (two) times daily as needed for refractory nausea / vomiting. Start on day 3 after chemotherapy. 30 tablet 1   pantoprazole (PROTONIX) 40 MG tablet Take 1 tablet (40 mg total) by mouth daily. 30 tablet 0   prochlorperazine (COMPAZINE) 10 MG tablet Take 1 tablet (10 mg total) by mouth every 6 (six) hours as needed (NAUSEA). 30 tablet 1   simvastatin (ZOCOR) 40 MG tablet Take 20 mg by mouth every evening.     traMADol (ULTRAM) 50 MG tablet Take 2 tablets (100 mg total) by mouth every 6 (six) hours as needed. 120 tablet 1   HYDROcodone-acetaminophen (NORCO) 5-325 MG tablet Take 1 tablet by mouth every 6 (six) hours as needed for moderate pain. (Patient not taking: Reported on 12/12/2018) 30 tablet 0   oxyCODONE (OXY IR/ROXICODONE) 5 MG immediate release tablet Take 1-2 tablets (5-10 mg total) by mouth every 6 (six) hours as needed for severe pain. (Patient not taking: Reported on 12/12/2018) 45 tablet 0   No current facility-administered medications for this visit.    Facility-Administered Medications Ordered in Other Visits  Medication Dose Route Frequency Provider Last Rate Last Dose   0.9 %  sodium chloride infusion   Intravenous Once Truitt Merle, MD       atropine injection 0.5 mg  0.5 mg Intravenous Once PRN Truitt Merle, MD       fluorouracil (ADRUCIL) 5,850 mg in sodium chloride 0.9 % 133 mL chemo infusion  2,400 mg/m2 (Treatment Plan Recorded) Intravenous 1 day or 1 dose Truitt Merle, MD       irinotecan (CAMPTOSAR) 360 mg in dextrose 5 % 500 mL chemo infusion  150 mg/m2 (Treatment Plan Recorded) Intravenous Once Truitt Merle, MD       leucovorin 976 mg in dextrose 5 % 250 mL infusion  400 mg/m2 (Treatment Plan Recorded) Intravenous Once Truitt Merle, MD       oxaliplatin (ELOXATIN) 200 mg in dextrose 5 % 500 mL chemo infusion  200 mg  Intravenous Once Truitt Merle, MD 270 mL/hr at 12/12/18 1034 200 mg at 12/12/18 1034   sodium chloride flush (NS) 0.9 % injection 10 mL  10 mL Intracatheter PRN Truitt Merle, MD        PHYSICAL EXAMINATION: ECOG PERFORMANCE STATUS: 1 - Symptomatic but completely ambulatory  Vitals:   12/12/18 0816  BP: 110/88  Pulse: 98  Resp: 18  Temp: 98.1 F (36.7 C)  SpO2: 97%   Filed Weights   12/12/18 0816  Weight: 226 lb 1.6 oz (102.6 kg)    GENERAL:alert, no distress and comfortable SKIN: no rashes or significant lesions EYES:  sclera clear OROPHARYNX:no thrush or ulcers LUNGS: clear to auscultation with normal breathing effort HEART: regular rate & rhythm, no lower extremity edema ABDOMEN:abdomen soft, non-tender on light palpation and normal bowel sounds Musculoskeletal: no cyanosis of digits and no clubbing  NEURO: alert & oriented x 3 with fluent speech PAC without erythema   LABORATORY DATA:  I have reviewed the data as listed CBC Latest  Ref Rng & Units 12/12/2018 12/09/2018 11/27/2018  WBC 4.0 - 10.5 K/uL 8.5 5.0 5.4  Hemoglobin 13.0 - 17.0 g/dL 12.8(L) 13.6 14.6  Hematocrit 39.0 - 52.0 % 38.5(L) 40.6 44.0  Platelets 150 - 400 K/uL 185 189 194     CMP Latest Ref Rng & Units 12/12/2018 12/09/2018 11/27/2018  Glucose 70 - 99 mg/dL 135(H) 128(H) 154(H)  BUN 8 - 23 mg/dL _0 Creatinine 0.61 - 1.24 mg/dL 0.85 0.94 0.91  Sodium 135 - 145 mmol/L 140 137 138  Potassium 3.5 - 5.1 mmol/L 3.5 3.5 4.1  Chloride 98 - 111 mmol/L 106 105 103  CO2 22 - 32 mmol/L _1 Calcium 8.9 - 10.3 mg/dL 8.8(L) 9.1 9.5  Total Protein 6.5 - 8.1 g/dL 6.5 7.1 7.4  Total Bilirubin 0.3 - 1.2 mg/dL 0.4 0.4 0.8  Alkaline Phos 38 - 126 U/L 93 92 89  AST 15 - 41 U/L _2 ALT 0 - 44 U/L 34 28 39      RADIOGRAPHIC STUDIES: I have personally reviewed the radiological images as listed and agreed with the findings in the report. No results found.   ASSESSMENT & PLAN: Lynne Righi Caseyis a 68  y.o.malewith   1. Adenocarcinoma oftail of Pancreas, cT3N0Mx, with liver metastasis, diagnosed 10/2018 -s/p first line cycle 1 FOLFIRINOX on 11/28/18. He tolerated first cycle moderaely well overall, with fatigue and delayed nausea/dry heaves. He developed mild diarrhea a week and a half after chemo. He will continue compazine before meals and PRN. Zofran does not help much but I encouraged him to try again if needed. I recommend he take protonix consistently to help with burping. Will add Emend from cycle 2  -he developed 3-4 episodes of diarrhea and required IVF on 12/09/18. He began imodium and diarrhea resolved. He will continue PRN.  -Abdominal pain is improved, manageable with tramadol and tylenol.  -we reviewed anti-emetics and symptom management, reviewed fever precautions, and discussed nutrition and hydration. -today he appears stable, he has recovered well from cycle 1. Labs reviewed. Adequate to proceed with cycle 2 FOLFIRINOX today -he will return for pump d/c and neulasta on day 3 -F/u in 2 weeks with cycle 3  2. Epigastricand LUQpainradiating to his back -secondary to pancreatic cancer  -improving; Takes 1-2 tramadol BID and alternates with tylenol. This regimen appears adequate for now. He does not tolerate hydrocodone or oxycodone well, makes him emotional  -occasionally sits in jetted tub for relaxation and pain relief, we previously reviewed safety precautions with this.  -He fell recently getting out of tub too quickly. He did not sustain injury. I recommend he stop sitting in the hot tub  3. Lower appetite, Weight loss  -appetite improved on day 4 after chemo; he drinks protein shakes, supplements, and flavored water -followed by dietician via phone calls -I encouraged small, frequent high caloric meals  -continue nutrition shakes and adequate hydration  4. DM, HTN -On Metformin and Lisinopril -BG 135 today  -BP 110/88 -stable; monitoring   PLAN: -labs  reviewed, proceed with cycle 2 FOLFIRINOX today -add Emend from cycle 2 -reviewed symptom management for nausea, diarrhea  -protonix to help with burping  -lab, f/u, and cycle 3 in 2 weeks   All questions were answered. The patient knows to call the clinic with any problems, questions or concerns. No barriers to learning was detected.     Alla Feeling, NP 12/12/18

## 2018-12-12 ENCOUNTER — Inpatient Hospital Stay: Payer: BC Managed Care – PPO

## 2018-12-12 ENCOUNTER — Telehealth: Payer: Self-pay | Admitting: Nurse Practitioner

## 2018-12-12 ENCOUNTER — Encounter: Payer: Self-pay | Admitting: Nurse Practitioner

## 2018-12-12 ENCOUNTER — Other Ambulatory Visit: Payer: Self-pay

## 2018-12-12 ENCOUNTER — Inpatient Hospital Stay (HOSPITAL_BASED_OUTPATIENT_CLINIC_OR_DEPARTMENT_OTHER): Payer: BC Managed Care – PPO | Admitting: Nurse Practitioner

## 2018-12-12 VITALS — BP 110/88 | HR 98 | Temp 98.1°F | Resp 18 | Ht 76.0 in | Wt 226.1 lb

## 2018-12-12 DIAGNOSIS — C252 Malignant neoplasm of tail of pancreas: Secondary | ICD-10-CM

## 2018-12-12 DIAGNOSIS — Z79899 Other long term (current) drug therapy: Secondary | ICD-10-CM

## 2018-12-12 DIAGNOSIS — Z7189 Other specified counseling: Secondary | ICD-10-CM

## 2018-12-12 DIAGNOSIS — K5732 Diverticulitis of large intestine without perforation or abscess without bleeding: Secondary | ICD-10-CM | POA: Diagnosis not present

## 2018-12-12 DIAGNOSIS — R110 Nausea: Secondary | ICD-10-CM

## 2018-12-12 DIAGNOSIS — C787 Secondary malignant neoplasm of liver and intrahepatic bile duct: Secondary | ICD-10-CM | POA: Diagnosis not present

## 2018-12-12 DIAGNOSIS — Z95828 Presence of other vascular implants and grafts: Secondary | ICD-10-CM

## 2018-12-12 LAB — CBC WITH DIFFERENTIAL (CANCER CENTER ONLY)
Abs Immature Granulocytes: 0.08 10*3/uL — ABNORMAL HIGH (ref 0.00–0.07)
Basophils Absolute: 0.1 10*3/uL (ref 0.0–0.1)
Basophils Relative: 1 %
Eosinophils Absolute: 0.1 10*3/uL (ref 0.0–0.5)
Eosinophils Relative: 2 %
HCT: 38.5 % — ABNORMAL LOW (ref 39.0–52.0)
Hemoglobin: 12.8 g/dL — ABNORMAL LOW (ref 13.0–17.0)
Immature Granulocytes: 1 %
Lymphocytes Relative: 12 %
Lymphs Abs: 1 10*3/uL (ref 0.7–4.0)
MCH: 33.1 pg (ref 26.0–34.0)
MCHC: 33.2 g/dL (ref 30.0–36.0)
MCV: 99.5 fL (ref 80.0–100.0)
Monocytes Absolute: 0.5 10*3/uL (ref 0.1–1.0)
Monocytes Relative: 6 %
Neutro Abs: 6.6 10*3/uL (ref 1.7–7.7)
Neutrophils Relative %: 78 %
Platelet Count: 185 10*3/uL (ref 150–400)
RBC: 3.87 MIL/uL — ABNORMAL LOW (ref 4.22–5.81)
RDW: 12.7 % (ref 11.5–15.5)
WBC Count: 8.5 10*3/uL (ref 4.0–10.5)
nRBC: 0 % (ref 0.0–0.2)

## 2018-12-12 LAB — CMP (CANCER CENTER ONLY)
ALT: 34 U/L (ref 0–44)
AST: 18 U/L (ref 15–41)
Albumin: 3.6 g/dL (ref 3.5–5.0)
Alkaline Phosphatase: 93 U/L (ref 38–126)
Anion gap: 10 (ref 5–15)
BUN: 10 mg/dL (ref 8–23)
CO2: 24 mmol/L (ref 22–32)
Calcium: 8.8 mg/dL — ABNORMAL LOW (ref 8.9–10.3)
Chloride: 106 mmol/L (ref 98–111)
Creatinine: 0.85 mg/dL (ref 0.61–1.24)
GFR, Est AFR Am: >60 mL/min (ref >60)
GFR, Estimated: >60 mL/min (ref >60)
Glucose, Bld: 135 mg/dL — ABNORMAL HIGH (ref 70–99)
Potassium: 3.5 mmol/L (ref 3.5–5.1)
Sodium: 140 mmol/L (ref 135–145)
Total Bilirubin: 0.4 mg/dL (ref 0.3–1.2)
Total Protein: 6.5 g/dL (ref 6.5–8.1)

## 2018-12-12 MED ORDER — DEXTROSE 5 % IV SOLN
Freq: Once | INTRAVENOUS | Status: AC
  Administered 2018-12-12: 09:00:00 via INTRAVENOUS
  Filled 2018-12-12: qty 250

## 2018-12-12 MED ORDER — DEXAMETHASONE SODIUM PHOSPHATE 10 MG/ML IJ SOLN: 10.0000 mg | Freq: Once | INTRAMUSCULAR | Status: DC

## 2018-12-12 MED ORDER — DEXAMETHASONE SODIUM PHOSPHATE 10 MG/ML IJ SOLN
INTRAMUSCULAR | Status: AC
  Filled 2018-12-12: qty 1

## 2018-12-12 MED ORDER — LEUCOVORIN CALCIUM INJECTION 350 MG
400.0000 mg/m2 | Freq: Once | INTRAVENOUS | Status: AC
  Administered 2018-12-12: 13:00:00 976 mg via INTRAVENOUS
  Filled 2018-12-12: qty 48.8

## 2018-12-12 MED ORDER — IRINOTECAN HCL CHEMO INJECTION 100 MG/5ML
150.0000 mg/m2 | Freq: Once | INTRAVENOUS | Status: AC
  Administered 2018-12-12: 13:00:00 360 mg via INTRAVENOUS
  Filled 2018-12-12: qty 15

## 2018-12-12 MED ORDER — OXALIPLATIN CHEMO INJECTION 100 MG/20ML
200.0000 mg | Freq: Once | INTRAVENOUS | Status: AC
  Administered 2018-12-12: 11:00:00 200 mg via INTRAVENOUS
  Filled 2018-12-12: qty 40

## 2018-12-12 MED ORDER — SODIUM CHLORIDE 0.9 % IV SOLN
2400.0000 mg/m2 | INTRAVENOUS | Status: DC
  Administered 2018-12-12: 5850 mg via INTRAVENOUS
  Filled 2018-12-12: qty 117

## 2018-12-12 MED ORDER — DEXTROSE 5 % IV SOLN
Freq: Once | INTRAVENOUS | Status: AC
  Filled 2018-12-12: qty 250

## 2018-12-12 MED ORDER — PALONOSETRON HCL INJECTION 0.25 MG/5ML
0.2500 mg | Freq: Once | INTRAVENOUS | Status: AC
  Administered 2018-12-12: 09:00:00 0.25 mg via INTRAVENOUS

## 2018-12-12 MED ORDER — ATROPINE SULFATE 1 MG/ML IJ SOLN
0.5000 mg | Freq: Once | INTRAMUSCULAR | Status: AC | PRN
  Administered 2018-12-12: 13:00:00 0.5 mg via INTRAVENOUS

## 2018-12-12 MED ORDER — PALONOSETRON HCL INJECTION 0.25 MG/5ML
INTRAVENOUS | Status: AC
  Filled 2018-12-12: qty 5

## 2018-12-12 MED ORDER — SODIUM CHLORIDE 0.9 % IV SOLN
Freq: Once | INTRAVENOUS | Status: AC
  Administered 2018-12-12: 10:00:00 via INTRAVENOUS
  Filled 2018-12-12: qty 5

## 2018-12-12 MED ORDER — ATROPINE SULFATE 1 MG/ML IJ SOLN
INTRAMUSCULAR | Status: AC
  Filled 2018-12-12: qty 1

## 2018-12-12 MED ORDER — SODIUM CHLORIDE 0.9% FLUSH
10.0000 mL | INTRAVENOUS | Status: DC | PRN
  Administered 2018-12-12: 10 mL via INTRAVENOUS
  Filled 2018-12-12: qty 10

## 2018-12-12 MED ORDER — SODIUM CHLORIDE 0.9 % IV SOLN
Freq: Once | INTRAVENOUS | Status: AC
  Administered 2018-12-12: 13:00:00 via INTRAVENOUS
  Filled 2018-12-12: qty 250

## 2018-12-12 MED ORDER — SODIUM CHLORIDE 0.9% FLUSH
10.0000 mL | INTRAVENOUS | Status: DC | PRN
  Filled 2018-12-12: qty 10

## 2018-12-12 NOTE — Patient Instructions (Signed)

## 2018-12-12 NOTE — Telephone Encounter (Signed)
No los per 5/7. °

## 2018-12-12 NOTE — Patient Instructions (Addendum)
Come back at ____________ on Saturday, 12/14/2018.   [place] Discharge Instructions for Patients Receiving Chemotherapy  Today you received the following chemotherapy agents:  Oxaliplatin, Leucovorin, Irinotecan, 5-FU. To help prevent nausea and vomiting after your treatment, we encourage you to take your nausea medication as prescribed.    If you develop nausea and vomiting that is not controlled by your nausea medication, call the clinic.   BELOW ARE SYMPTOMS THAT SHOULD BE REPORTED IMMEDIATELY:  *FEVER GREATER THAN 100.5 F  *CHILLS WITH OR WITHOUT FEVER  NAUSEA AND VOMITING THAT IS NOT CONTROLLED WITH YOUR NAUSEA MEDICATION  *UNUSUAL SHORTNESS OF BREATH  *UNUSUAL BRUISING OR BLEEDING  TENDERNESS IN MOUTH AND THROAT WITH OR WITHOUT PRESENCE OF ULCERS  *URINARY PROBLEMS  *BOWEL PROBLEMS  UNUSUAL RASH Items with * indicate a potential emergency and should be followed up as soon as possible.  Feel free to call the clinic should you have any questions or concerns. The clinic phone number is [phone].  Please show the [place] at check-in to the Emergency Department and triage nurse.

## 2018-12-14 ENCOUNTER — Inpatient Hospital Stay: Payer: BC Managed Care – PPO

## 2018-12-14 ENCOUNTER — Other Ambulatory Visit: Payer: Self-pay

## 2018-12-14 VITALS — BP 122/90 | HR 100 | Temp 98.5°F | Resp 16

## 2018-12-14 DIAGNOSIS — C252 Malignant neoplasm of tail of pancreas: Secondary | ICD-10-CM | POA: Diagnosis not present

## 2018-12-14 DIAGNOSIS — Z7189 Other specified counseling: Secondary | ICD-10-CM

## 2018-12-14 MED ORDER — SODIUM CHLORIDE 0.9% FLUSH
10.0000 mL | INTRAVENOUS | Status: DC | PRN
  Administered 2018-12-14: 10 mL
  Filled 2018-12-14: qty 10

## 2018-12-14 MED ORDER — PEGFILGRASTIM-CBQV 6 MG/0.6ML ~~LOC~~ SOSY
PREFILLED_SYRINGE | SUBCUTANEOUS | Status: AC
  Filled 2018-12-14: qty 0.6

## 2018-12-14 MED ORDER — PEGFILGRASTIM-CBQV 6 MG/0.6ML ~~LOC~~ SOSY
6.0000 mg | PREFILLED_SYRINGE | Freq: Once | SUBCUTANEOUS | Status: AC
  Administered 2018-12-14: 6 mg via SUBCUTANEOUS

## 2018-12-14 MED ORDER — HEPARIN SOD (PORK) LOCK FLUSH 100 UNIT/ML IV SOLN
500.0000 [IU] | Freq: Once | INTRAVENOUS | Status: AC | PRN
  Administered 2018-12-14: 500 [IU]
  Filled 2018-12-14: qty 5

## 2018-12-16 ENCOUNTER — Inpatient Hospital Stay: Payer: BC Managed Care – PPO | Admitting: Nutrition

## 2018-12-16 NOTE — Progress Notes (Signed)
RD working remotely.  Nutrition follow up completed with patient receiving chemotherapy for Pancreas cancer. Noted weight 226.1 pounds on May 7, decreased from 232.5 pounds on April 23. Glucose 135. Patient reports nausea is improved and is taking 2 kinds of nausea medications as prescribed. No dry heaves this round. Reports diarrhea is improved with immodium. Continues to drink Premier Protein.  Nutrition Diagnosis: Unintentional Weight loss continues.  Intervention: Reviewed strategies for improving nausea and vomiting with food. Wife questions if ginger would help and I encouraged use as tolerated. Continue Premier Protein. Immodium as needed for diarrhea.  Monitoring, Evaluation, Goals: Patient will tolerate adequate calorie and protein intake to minimize weight loss.  Next Visit:Thursday, June 4 by telephone.

## 2018-12-17 ENCOUNTER — Telehealth: Payer: Self-pay | Admitting: Nurse Practitioner

## 2018-12-17 NOTE — Telephone Encounter (Signed)
Called patient to review genetic testing results. Patient not available. Wife took my contact info and she will have him call me back.

## 2018-12-18 ENCOUNTER — Telehealth: Payer: Self-pay | Admitting: Nurse Practitioner

## 2018-12-18 NOTE — Telephone Encounter (Signed)
I spoke with the patient and his wife about genetic testing report which was reviewed first by Roma Kayser, genetic counselor. 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 I offered referral to genetic counselor to discuss further and as it relates to his family. He plans to speak with his wife and sons and will let us know if he wants a referral. He appreciates the call.  Cira Rue, NP

## 2018-12-23 ENCOUNTER — Other Ambulatory Visit: Payer: Self-pay | Admitting: Hematology

## 2018-12-23 DIAGNOSIS — Z7189 Other specified counseling: Secondary | ICD-10-CM

## 2018-12-23 DIAGNOSIS — C252 Malignant neoplasm of tail of pancreas: Secondary | ICD-10-CM

## 2018-12-25 NOTE — Progress Notes (Signed)
[place]   [phone] Fax:([phone]   Clinic Follow up Note   Patient Care Team: Leonard Downing, MD as PCP - General (Family Medicine)  Date of Service:  12/26/2018  CHIEF COMPLAINT: F/u of Pancreatic Cancer  SUMMARY OF ONCOLOGIC HISTORY: Oncology History   Cancer Staging Pancreatic cancer [facility]) Staging form: Exocrine Pancreas, AJCC 8th Edition - Clinical stage from 10/29/2018: Stage IV (cT3, cN0, cM1) - Signed by Truitt Merle, MD on 11/05/2018       Pancreatic cancer ([place])   10/09/2018 Procedure    Upper Endoscopy by. Dr Paulita Fujita 10/29/18 IMPRESSION - There was no evidence of significant pathology in the left lobe of the liver. - There was no sign of significant pathology in the gallbladder. - Many abnormal lymph nodes were visualized in the splenic region (level 19), perigastric region and peripancreatic region. - A mass was identified in the pancreatic tail. This was staged T4 N2 Mx by endosonographic criteria. Fine needle aspiration performed.    10/10/2018 Imaging    CT Abdomen 10/10/18 IMPRESSION: 1. Uncomplicated acute diverticulitis of the proximal sigmoid colon. 2. Ill-defined low-density in the pancreatic tail measuring at least 4.7 cm, with possible punctate peripheral calcifications. This is suspicious for pancreatic neoplasm, however sequela of prior pancreatitis is also considered. Recommend further characterization with pancreatic protocol MRI when patient is able to tolerate breath hold technique. 3. 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 4. 16 mm cyst in the left lobe of the liver. Aortic Atherosclerosis (ICD10-I70.0).    10/16/2018 Imaging    MRI Abdomen at [facility] 10/16/18 IMPRESSION: 1. Mass in the tail the pancreas concerning for neoplasm. This contacts the spleen in the splenic hilum. 2. There is stranding in the fat extending from the tumor to the  celiac axis and left adrenal gland. Cannot exclude infiltrating tumor. 3. 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 4. metastatic lesion in the liver    10/29/2018 Initial Biopsy    Diagnosis 10/29/18 FINE NEEDLE ASPIRATION, ENDOSCOPIC, PANCREAS TAIL(SPECIMEN 1 OF 1 COLLECTED 10/29/18): MALIGNANT CELLS PRESENT, CONSISTENT WITH ADENOCARCINOMA. SEE COMMENT. COMMENT: THERE IS INSUFFICIENT TUMOR PRESENT FOR ADDITIONAL STUDIES.    10/29/2018 Cancer Staging    Staging form: Exocrine Pancreas, AJCC 8th Edition - Clinical stage from 10/29/2018: Stage IV (cT3, cN0, cM1) - Signed by Truitt Merle, MD on 11/05/2018    11/04/2018 Initial Diagnosis    Pancreatic cancer ([place])    11/27/2018 Imaging    CT Chest  IMPRESSION: 1.  No acute process or evidence of metastatic disease in the chest. 2. Locally advanced pancreatic carcinoma, as detailed previously. Cannot exclude superimposed pancreatitis. 3. New and enlarging liver lesions, suspicious for hepatic metastasis. 4. Aortic atherosclerosis (ICD10-I70.0), coronary artery atherosclerosis and emphysema (ICD10-J43.9).    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  The Common Hereditary Gene Panel offered by [facility] includes sequencing and/or deletion duplication testing of the following 48 genes: APC, ATM, AXIN2, BARD1, BMPR1A, BRCA1, BRCA2, BRIP1, CDH1, CDK4, CDKN2A (p14ARF), CDKN2A (p16INK4a), CHEK2, CTNNA1, DICER1, EPCAM (Deletion/duplication testing only), GREM1 (promoter region deletion/duplication testing only), KIT, MEN1, MLH1, MSH2, MSH3, MSH6, MUTYH, NBN, NF1, NHTL1, PALB2, PDGFRA, PMS2, POLD1, POLE, PTEN, RAD50, RAD51C, RAD51D, RNF43, SDHB, SDHC, SDHD, SMAD4, SMARCA4. STK11, TP53, TSC1, TSC2,  and VHL.  The following genes were evaluated for  sequence changes only: SDHA and HOXB13 c.251G>A variant only. The report date is December 04, 2018.,      CURRENT THERAPY:  First line FOLFIRINOX q2weeks starting 11/28/18  INTERVAL HISTORY:  Vincent [name] is here for a follow up and treatment. He is here alone. Although his BP is lower today he feels well, ate breakfast and drinks 5, 20 ounces of liquid in a day. He notes he has been able to tolerate FOLFIRINOX well so far. Since starting chemo he overall feels better and his left abdominal pain has resolved. He notes back aches occasionally which he attributes to sitting a lot. 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 He plans to retire in 04/2019.  For pain he is only taking tramadol. He is also taking Ginger root.  I reviewed his medication list. He is on lisinopril. He does check his BP at home. He notes his BG at home was read at 140 in the afternoon.  He wonders if he can travel to [facility] in his camper.    REVIEW OF SYSTEMS:   Constitutional: Denies fevers, chills or abnormal weight loss Eyes: Denies blurriness of vision Ears, nose, mouth, throat, and face: Denies mucositis or sore throat Respiratory: Denies cough, dyspnea or wheezes Cardiovascular: Denies palpitation, chest discomfort or lower extremity swelling Gastrointestinal:  Denies nausea, heartburn or change in bowel habits Skin: Denies abnormal skin rashes MSK: (+) back aches occasionally.  Lymphatics: Denies new lymphadenopathy or easy bruising Neurological:Denies numbness, tingling or new weaknesses Behavioral/Psych: Mood is stable, no new changes  All other systems were reviewed with the patient and are negative.  MEDICAL HISTORY:  Past Medical History:  Diagnosis Date   Diabetes mellitus without complication ([place])    pt. states that he is not diabeticalthought he takes metformin   Eye hemorrhage    right   Hypertension     SURGICAL HISTORY: Past Surgical History:  Procedure Laterality Date     ESOPHAGOGASTRODUODENOSCOPY (EGD) WITH PROPOFOL N/A 10/29/2018   Procedure: ESOPHAGOGASTRODUODENOSCOPY (EGD) WITH PROPOFOL;  Surgeon: Arta Silence, MD;  Location: [facility];  Service: Endoscopy;  Laterality: N/A;   EUS N/A 10/29/2018   Procedure: FULL UPPER ENDOSCOPIC ULTRASOUND (EUS) RADIAL;  Surgeon: Arta Silence, MD;  Location: [facility];  Service: Endoscopy;  Laterality: N/A;   FINE NEEDLE ASPIRATION N/A 10/29/2018   Procedure: FINE NEEDLE ASPIRATION (FNA) LINEAR;  Surgeon: Arta Silence, MD;  Location: [facility];  Service: Endoscopy;  Laterality: N/A;   IR IMAGING GUIDED PORT INSERTION  11/11/2018   LUMBAR LAMINECTOMY/DECOMPRESSION MICRODISCECTOMY Left 10/11/2017   Procedure: Microlumbar decompression Lumbar Four-Five Left;  Surgeon: Susa Day, MD;  Location: [place];  Service: Orthopedics;  Laterality: Left;  Microlumbar decompression Lumbar Four-Five Left   removal of wisdom teeth  1977    I have reviewed the social history and family history with the patient and they are unchanged from previous note.  ALLERGIES:  has No Known Allergies.  MEDICATIONS:  Current Outpatient Medications  Medication Sig Dispense Refill   Accu-Chek FastClix Lancets MISC daily. for testing     aspirin EC 81 MG tablet Take 1 tablet (81 mg total) by mouth daily after breakfast. Resume 4 days post-op     Ginger, Zingiber officinalis, (GINGER ROOT PO) Take 750 mg by mouth.     HYDROcodone-acetaminophen (NORCO) 5-325 MG tablet Take 1 tablet by mouth every 6 (six) hours as needed for moderate pain. [place]  tablet 0   lidocaine-prilocaine (EMLA) cream Apply to affected area once 30 g 3   lisinopril (PRINIVIL,ZESTRIL) 10 MG tablet Take 10 mg by mouth daily after breakfast.     metFORMIN (GLUCOPHAGE) 1000 MG tablet TAKE 1 TABLET BY MOUTH AT BREAKFAST AND 1/2 TABLET AT SUPPER     ondansetron (ZOFRAN) 8 MG tablet Take 1 tablet (8 mg total) by mouth 2 (two) times daily as needed for refractory  nausea / vomiting. Start on day 3 after chemotherapy. 30 tablet 1   oxyCODONE (OXY IR/ROXICODONE) 5 MG immediate release tablet Take 1-2 tablets (5-10 mg total) by mouth every 6 (six) hours as needed for severe pain. 45 tablet 0   pantoprazole (PROTONIX) 40 MG tablet Take 1 tablet (40 mg total) by mouth daily. 30 tablet 0   prochlorperazine (COMPAZINE) 10 MG tablet TAKE 1 TABLET (10 MG TOTAL) BY MOUTH EVERY 6 (SIX) HOURS AS NEEDED (NAUSEA). 30 tablet 1   simvastatin (ZOCOR) 40 MG tablet Take 20 mg by mouth every evening.     traMADol (ULTRAM) 50 MG tablet Take 2 tablets (100 mg total) by mouth every 6 (six) hours as needed. 120 tablet 1   potassium chloride SA (K-DUR) 20 MEQ tablet Take 1 tablet (20 mEq total) by mouth every other day. 30 tablet 0   No current facility-administered medications for this visit.    Facility-Administered Medications Ordered in Other Visits  Medication Dose Route Frequency Provider Last Rate Last Dose   fluorouracil (ADRUCIL) 5,850 mg in sodium chloride 0.9 % 133 mL chemo infusion  2,400 mg/m2 (Treatment Plan Recorded) Intravenous 1 day or 1 dose Truitt Merle, MD   5,850 mg at 12/26/18 1420   heparin lock flush 100 unit/mL  500 Units Intracatheter Once PRN Truitt Merle, MD       sodium chloride flush (NS) 0.9 % injection 10 mL  10 mL Intracatheter PRN Truitt Merle, MD        PHYSICAL EXAMINATION: ECOG PERFORMANCE STATUS: 1 - Symptomatic but completely ambulatory  Vitals:   12/26/18 0841  BP: 93/75  Pulse: (!) 106  Resp: 16  Temp: 98.5 F (36.9 C)  SpO2: 97%   Filed Weights   12/26/18 0841  Weight: 217 lb 11.2 oz (98.7 kg)    GENERAL:alert, no distress and comfortable SKIN: skin color, texture, turgor are normal, no rashes or significant lesions EYES: normal, Conjunctiva are pink and non-injected, sclera clear OROPHARYNX:no exudate, no erythema and lips, buccal mucosa, and tongue normal  NECK: supple, thyroid normal size, non-tender, without  nodularity LYMPH:  no palpable lymphadenopathy in the cervical, axillary or inguinal LUNGS: clear to auscultation and percussion with normal breathing effort HEART: regular rate & rhythm and no murmurs and no lower extremity edema ABDOMEN:abdomen soft, non-tender and normal bowel sounds Musculoskeletal:no cyanosis of digits and no clubbing  NEURO: alert & oriented x 3 with fluent speech, no focal motor/sensory deficits  LABORATORY DATA:  I have reviewed the data as listed CBC Latest Ref Rng & Units 12/26/2018 12/12/2018 12/09/2018  WBC 4.0 - 10.5 K/uL 7.3 8.5 5.0  Hemoglobin 13.0 - 17.0 g/dL 12.7(L) 12.8(L) 13.6  Hematocrit 39.0 - 52.0 % 38.2(L) 38.5(L) 40.6  Platelets 150 - 400 K/uL 217 185 189     CMP Latest Ref Rng & Units 12/26/2018 12/12/2018 12/09/2018  Glucose 70 - 99 mg/dL 150(H) 135(H) 128(H)  BUN 8 - 23 mg/dL _0 Creatinine 0.61 - 1.24 mg/dL 0.90 0.85 0.94  Sodium 135 -  145 mmol/L 140 140 137  Potassium 3.5 - 5.1 mmol/L 3.4(L) 3.5 3.5  Chloride 98 - 111 mmol/L 105 106 105  CO2 22 - 32 mmol/L _0 Calcium 8.9 - 10.3 mg/dL 8.9 8.8(L) 9.1  Total Protein 6.5 - 8.1 g/dL 6.4(L) 6.5 7.1  Total Bilirubin 0.3 - 1.2 mg/dL 0.3 0.4 0.4  Alkaline Phos 38 - 126 U/L 94 93 92  AST 15 - 41 U/L _1 ALT 0 - 44 U/L 50(H) 34 28      RADIOGRAPHIC STUDIES: I have personally reviewed the radiological images as listed and agreed with the findings in the report. No results found.   ASSESSMENT & PLAN:  Vincent [name] is a 68 y.o. male with   1. Adenocarcinoma oftail of Pancreas, cT3N0Mx, with liver metastasis -He was recently diagnosed in 10/2018.  -His CT APand abdominal MRI showed a 1.6cm lesion in thedorm, also it appears likely a benign cyst on the CT scan, however the MRI features are concerning for livermetastasis. -His baseline CA 19-9 was significantly elevated at 3,683 (11/05/18). This is alsoconcerningfor metastatic disease.  -I discussed his CT Chest from  11/27/18 shows no acute process or evidence of metastatic disease in chest. It also showed growth of known liver lesions (2.9cm) along with a new 1.6cm liver lesion in inferior right lobe, which are highly suspicious for metastasis.  -I discussed to confirm with definitive prognosis we can do liver biopsy. Given the typical scan findings and growth over time, the liver lesions are likely metastatic lesions. Given the current COVID-19, I will probably skip biopsy. He agreed.  -I discussed if metastatic disease, he is no longer a surgical candidate and his cancer is no longer curable. Goal of therapy would be palliative to prolong his life and improve his quality of life. He understands.  -He started first-line FOLFIRINOX every 2 weeks on 11/28/18. He I tolerating moderately well overall.  -Labs reviewed, CBC and CMP WNL except mild anemia with Hg 12.7, K 3.4, BG 150, protein 6.4, ALT 50. Overall adequate to proceed with cycle 3 FOLFIRINOX today.  -Plan to repeat CT scan after cycle 5.  -He would like to have a small trip to [facility], will give short chemo break after next scan. I strongly encouraged him to cover his skin in the sun. I encouraged him to maintain his positive outlook.  -f/u in 2 weeks before cycle 4    2. Epigastricand LUQpainradiating to his back -secondary topancreaticcancer -[facility] but does not feel it helps as much as Tramadol. I advised him not to go over 8 tabs Tramadol in a day.  -Abdominal pain has revolved since he started chemo, back pain manageable with tramadol and tylenol.   3. Lower appetite, Weight loss  -Eating exacerbates his epigastric pain and nausea -He has lost 40 pounds recently. -Appetite adequate. He drinks protein shakes, supplements, and flavored water, but he weight continues to trend down. I advised him to increase protein and carbohydrates in diet, but watch his sugar intake.  -Continue to follow up with  Dietician  4. DM, HTN  -On Metformin and Lisinopril  -We will monitor his blood pressure and blood glucose closely during the chemotherapy. May adjust medication during chemo if needed -Continue to f/u with PCP -He has lower BP today 93/75 (12/26/18). He will hold Lisinopril for now. He will monitor BP at home and if elevated again he will restart Lisinopril.   5. Hypokalemia  -  His Potassium has been on the lower end of normal and has decreased to 3.4 today (12/26/18) -I recommended he increase potassium in his diet.  -I will prescribe oral K today.    PLAN: -I prescribed oral K, he will hold lisinopril -labs reviewed and adequate to proceed with C3 mFOLFIRINOX today with Udenyca on day 3 -lab, flush, f/u and chemo every 2 weeks    No problem-specific Assessment & Plan notes found for this encounter.   No orders of the defined types were placed in this encounter.  All questions were answered. The patient knows to call the clinic with any problems, questions or concerns. No barriers to learning was detected. I spent 20 minutes counseling the patient face to face. The total time spent in the appointment was 25 minutes and more than 50% was on counseling and review of test results     Truitt Merle, MD 12/26/2018   I, Joslyn Devon, am acting as scribe for Truitt Merle, MD.   I have reviewed the above documentation for accuracy and completeness, and I agree with the above.

## 2018-12-26 ENCOUNTER — Telehealth: Payer: Self-pay | Admitting: Hematology

## 2018-12-26 ENCOUNTER — Other Ambulatory Visit: Payer: Self-pay

## 2018-12-26 ENCOUNTER — Inpatient Hospital Stay: Payer: BC Managed Care – PPO

## 2018-12-26 ENCOUNTER — Ambulatory Visit: Payer: Self-pay | Admitting: Hematology

## 2018-12-26 ENCOUNTER — Encounter: Payer: Self-pay | Admitting: Hematology

## 2018-12-26 ENCOUNTER — Inpatient Hospital Stay (HOSPITAL_BASED_OUTPATIENT_CLINIC_OR_DEPARTMENT_OTHER): Payer: BC Managed Care – PPO | Admitting: Hematology

## 2018-12-26 ENCOUNTER — Ambulatory Visit: Payer: Self-pay

## 2018-12-26 VITALS — BP 93/75 | HR 106 | Temp 98.5°F | Resp 16 | Ht 76.0 in | Wt 217.7 lb

## 2018-12-26 VITALS — HR 95

## 2018-12-26 DIAGNOSIS — D649 Anemia, unspecified: Secondary | ICD-10-CM

## 2018-12-26 DIAGNOSIS — C250 Malignant neoplasm of head of pancreas: Secondary | ICD-10-CM

## 2018-12-26 DIAGNOSIS — C252 Malignant neoplasm of tail of pancreas: Secondary | ICD-10-CM

## 2018-12-26 DIAGNOSIS — E876 Hypokalemia: Secondary | ICD-10-CM | POA: Diagnosis not present

## 2018-12-26 DIAGNOSIS — C787 Secondary malignant neoplasm of liver and intrahepatic bile duct: Secondary | ICD-10-CM

## 2018-12-26 DIAGNOSIS — Z79899 Other long term (current) drug therapy: Secondary | ICD-10-CM

## 2018-12-26 DIAGNOSIS — Z7189 Other specified counseling: Secondary | ICD-10-CM

## 2018-12-26 DIAGNOSIS — Z95828 Presence of other vascular implants and grafts: Secondary | ICD-10-CM

## 2018-12-26 LAB — CMP (CANCER CENTER ONLY)
ALT: 50 U/L — ABNORMAL HIGH (ref 0–44)
AST: 22 U/L (ref 15–41)
Albumin: 3.5 g/dL (ref 3.5–5.0)
Alkaline Phosphatase: 94 U/L (ref 38–126)
Anion gap: 10 (ref 5–15)
BUN: 14 mg/dL (ref 8–23)
CO2: 25 mmol/L (ref 22–32)
Calcium: 8.9 mg/dL (ref 8.9–10.3)
Chloride: 105 mmol/L (ref 98–111)
Creatinine: 0.9 mg/dL (ref 0.61–1.24)
GFR, Est AFR Am: >60 mL/min (ref >60)
GFR, Estimated: >60 mL/min (ref >60)
Glucose, Bld: 150 mg/dL — ABNORMAL HIGH (ref 70–99)
Potassium: 3.4 mmol/L — ABNORMAL LOW (ref 3.5–5.1)
Sodium: 140 mmol/L (ref 135–145)
Total Bilirubin: 0.3 mg/dL (ref 0.3–1.2)
Total Protein: 6.4 g/dL — ABNORMAL LOW (ref 6.5–8.1)

## 2018-12-26 LAB — CBC WITH DIFFERENTIAL (CANCER CENTER ONLY)
Abs Immature Granulocytes: 0.09 10*3/uL — ABNORMAL HIGH (ref 0.00–0.07)
Basophils Absolute: 0.1 10*3/uL (ref 0.0–0.1)
Basophils Relative: 1 %
Eosinophils Absolute: 0.1 10*3/uL (ref 0.0–0.5)
Eosinophils Relative: 2 %
HCT: 38.2 % — ABNORMAL LOW (ref 39.0–52.0)
Hemoglobin: 12.7 g/dL — ABNORMAL LOW (ref 13.0–17.0)
Immature Granulocytes: 1 %
Lymphocytes Relative: 17 %
Lymphs Abs: 1.3 10*3/uL (ref 0.7–4.0)
MCH: 33.2 pg (ref 26.0–34.0)
MCHC: 33.2 g/dL (ref 30.0–36.0)
MCV: 100 fL (ref 80.0–100.0)
Monocytes Absolute: 0.7 10*3/uL (ref 0.1–1.0)
Monocytes Relative: 9 %
Neutro Abs: 5.1 10*3/uL (ref 1.7–7.7)
Neutrophils Relative %: 70 %
Platelet Count: 217 10*3/uL (ref 150–400)
RBC: 3.82 MIL/uL — ABNORMAL LOW (ref 4.22–5.81)
RDW: 13.7 % (ref 11.5–15.5)
WBC Count: 7.3 10*3/uL (ref 4.0–10.5)
nRBC: 0 % (ref 0.0–0.2)

## 2018-12-26 MED ORDER — SODIUM CHLORIDE 0.9 % IV SOLN
Freq: Once | INTRAVENOUS | Status: AC
  Administered 2018-12-26: 10:00:00 via INTRAVENOUS
  Filled 2018-12-26: qty 5

## 2018-12-26 MED ORDER — POTASSIUM CHLORIDE CRYS ER 20 MEQ PO TBCR: 20.0000 meq | EXTENDED_RELEASE_TABLET | ORAL | 0 refills | Status: DC

## 2018-12-26 MED ORDER — PALONOSETRON HCL INJECTION 0.25 MG/5ML
INTRAVENOUS | Status: AC
  Filled 2018-12-26: qty 5

## 2018-12-26 MED ORDER — DEXTROSE 5 % IV SOLN
Freq: Once | INTRAVENOUS | Status: AC
  Administered 2018-12-26: 09:00:00 via INTRAVENOUS
  Filled 2018-12-26: qty 250

## 2018-12-26 MED ORDER — HEPARIN SOD (PORK) LOCK FLUSH 100 UNIT/ML IV SOLN
500.0000 [IU] | Freq: Once | INTRAVENOUS | Status: DC | PRN
  Filled 2018-12-26: qty 5

## 2018-12-26 MED ORDER — SODIUM CHLORIDE 0.9 % IV SOLN
150.0000 mg/m2 | Freq: Once | INTRAVENOUS | Status: AC
  Administered 2018-12-26: 360 mg via INTRAVENOUS
  Filled 2018-12-26: qty 5

## 2018-12-26 MED ORDER — ATROPINE SULFATE 1 MG/ML IJ SOLN: 0.5000 mg | Freq: Once | INTRAMUSCULAR | Status: DC | PRN

## 2018-12-26 MED ORDER — ATROPINE SULFATE 0.4 MG/ML IJ SOLN
0.4000 mg | Freq: Once | INTRAMUSCULAR | Status: AC | PRN
  Administered 2018-12-26: 13:00:00 0.4 mg via INTRAVENOUS

## 2018-12-26 MED ORDER — OXALIPLATIN CHEMO INJECTION 100 MG/20ML
82.0000 mg/m2 | Freq: Once | INTRAVENOUS | Status: AC
  Administered 2018-12-26: 10:00:00 200 mg via INTRAVENOUS
  Filled 2018-12-26: qty 40

## 2018-12-26 MED ORDER — PALONOSETRON HCL INJECTION 0.25 MG/5ML
0.2500 mg | Freq: Once | INTRAVENOUS | Status: AC
  Administered 2018-12-26: 0.25 mg via INTRAVENOUS

## 2018-12-26 MED ORDER — SODIUM CHLORIDE 0.9 % IV SOLN
400.0000 mg/m2 | Freq: Once | INTRAVENOUS | Status: AC
  Administered 2018-12-26: 976 mg via INTRAVENOUS
  Filled 2018-12-26: qty 48.8

## 2018-12-26 MED ORDER — ATROPINE SULFATE 0.4 MG/ML IJ SOLN
INTRAMUSCULAR | Status: AC
  Filled 2018-12-26: qty 1

## 2018-12-26 MED ORDER — SODIUM CHLORIDE 0.9 % IV SOLN
2400.0000 mg/m2 | INTRAVENOUS | Status: DC
  Administered 2018-12-26: 14:00:00 5850 mg via INTRAVENOUS
  Filled 2018-12-26: qty 117

## 2018-12-26 MED ORDER — SODIUM CHLORIDE 0.9% FLUSH
10.0000 mL | INTRAVENOUS | Status: DC | PRN
  Filled 2018-12-26: qty 10

## 2018-12-26 MED ORDER — SODIUM CHLORIDE 0.9% FLUSH
10.0000 mL | Freq: Once | INTRAVENOUS | Status: AC
  Administered 2018-12-26: 10 mL
  Filled 2018-12-26: qty 10

## 2018-12-26 NOTE — Telephone Encounter (Signed)
No los per 5/21 los.

## 2018-12-26 NOTE — Patient Instructions (Signed)
[facility] Discharge Instructions for Patients Receiving Chemotherapy  Today you received the following chemotherapy agents:  Oxaliplatin, Leucovorin, Camptosar, Fluorouracil, Pump To help prevent nausea and vomiting after your treatment, we encourage you to take your nausea medication as prescribed.    If you develop nausea and vomiting that is not controlled by your nausea medication, call the clinic.   BELOW ARE SYMPTOMS THAT SHOULD BE REPORTED IMMEDIATELY:  *FEVER GREATER THAN 100.5 F  *CHILLS WITH OR WITHOUT FEVER  NAUSEA AND VOMITING THAT IS NOT CONTROLLED WITH YOUR NAUSEA MEDICATION  *UNUSUAL SHORTNESS OF BREATH  *UNUSUAL BRUISING OR BLEEDING  TENDERNESS IN MOUTH AND THROAT WITH OR WITHOUT PRESENCE OF ULCERS  *URINARY PROBLEMS  *BOWEL PROBLEMS  UNUSUAL RASH Items with * indicate a potential emergency and should be followed up as soon as possible.  Feel free to call the clinic should you have any questions or concerns. The clinic phone number is [phone].  Please show the [place] at check-in to the Emergency Department and triage nurse.  Oxaliplatin Injection What is this medicine? OXALIPLATIN (ox AL i PLA tin) is a chemotherapy drug. It targets fast dividing cells, like cancer cells, and causes these cells to die. This medicine is used to treat cancers of the colon and rectum, and many other cancers. This medicine may be used for other purposes; ask your health care provider or pharmacist if you have questions. COMMON BRAND NAME(S): Eloxatin What should I tell my health care provider before I take this medicine? They need to know if you have any of these conditions: -kidney disease -an unusual or allergic reaction to oxaliplatin, other chemotherapy, other medicines, foods, dyes, or preservatives -pregnant or trying to get pregnant -breast-feeding How should I use this medicine? This drug is given as an infusion into a vein. It is  administered in a hospital or clinic by a specially trained health care professional. Talk to your pediatrician regarding the use of this medicine in children. Special care may be needed. Overdosage: If you think you have taken too much of this medicine contact a poison control center or emergency room at once. NOTE: This medicine is only for you. Do not share this medicine with others. What if I miss a dose? It is important not to miss a dose. Call your doctor or health care professional if you are unable to keep an appointment. What may interact with this medicine? -medicines to increase blood counts like filgrastim, pegfilgrastim, sargramostim -probenecid -some antibiotics like amikacin, gentamicin, neomycin, polymyxin B, streptomycin, tobramycin -zalcitabine Talk to your doctor or health care professional before taking any of these medicines: -acetaminophen -aspirin -ibuprofen -ketoprofen -naproxen This list may not describe all possible interactions. Give your health care provider a list of all the medicines, herbs, non-prescription drugs, or dietary supplements you use. Also tell them if you smoke, drink alcohol, or use illegal drugs. Some items may interact with your medicine. What should I watch for while using this medicine? Your condition will be monitored carefully while you are receiving this medicine. You will need important blood work done while you are taking this medicine. This medicine can make you more sensitive to cold. Do not drink cold drinks or use ice. Cover exposed skin before coming in contact with cold temperatures or cold objects. When out in cold weather wear warm clothing and cover your mouth and nose to warm the air that goes into your lungs. Tell your doctor if you get sensitive to  the cold. This drug may make you feel generally unwell. This is not uncommon, as chemotherapy can affect healthy cells as well as cancer cells. Report any side effects. Continue your  course of treatment even though you feel ill unless your doctor tells you to stop. In some cases, you may be given additional medicines to help with side effects. Follow all directions for their use. Call your doctor or health care professional for advice if you get a fever, chills or sore throat, or other symptoms of a cold or flu. Do not treat yourself. This drug decreases your body's ability to fight infections. Try to avoid being around people who are sick. This medicine may increase your risk to bruise or bleed. Call your doctor or health care professional if you notice any unusual bleeding. Be careful brushing and flossing your teeth or using a toothpick because you may get an infection or bleed more easily. If you have any dental work done, tell your dentist you are receiving this medicine. Avoid taking products that contain aspirin, acetaminophen, ibuprofen, naproxen, or ketoprofen unless instructed by your doctor. These medicines may hide a fever. Do not become pregnant while taking this medicine. Women should inform their doctor if they wish to become pregnant or think they might be pregnant. There is a potential for serious side effects to an unborn child. Talk to your health care professional or pharmacist for more information. Do not breast-feed an infant while taking this medicine. Call your doctor or health care professional if you get diarrhea. Do not treat yourself. What side effects may I notice from receiving this medicine? Side effects that you should report to your doctor or health care professional as soon as possible: -allergic reactions like skin rash, itching or hives, swelling of the face, lips, or tongue -low blood counts - This drug may decrease the number of white blood cells, red blood cells and platelets. You may be at increased risk for infections and bleeding. -signs of infection - fever or chills, cough, sore throat, pain or difficulty passing urine -signs of decreased  platelets or bleeding - bruising, pinpoint red spots on the skin, black, tarry stools, nosebleeds -signs of decreased red blood cells - unusually weak or tired, fainting spells, lightheadedness -breathing problems -chest pain, pressure -cough -diarrhea -jaw tightness -mouth sores -nausea and vomiting -pain, swelling, redness or irritation at the injection site -pain, tingling, numbness in the hands or feet -problems with balance, talking, walking -redness, blistering, peeling or loosening of the skin, including inside the mouth -trouble passing urine or change in the amount of urine Side effects that usually do not require medical attention (report to your doctor or health care professional if they continue or are bothersome): -changes in vision -constipation -hair loss -loss of appetite -metallic taste in the mouth or changes in taste -stomach pain This list may not describe all possible side effects. Call your doctor for medical advice about side effects. You may report side effects to FDA at 1-800-FDA-1088. Where should I keep my medicine? This drug is given in a hospital or clinic and will not be stored at home. NOTE: This sheet is a summary. It may not cover all possible information. If you have questions about this medicine, talk to your doctor, pharmacist, or health care provider.  2019 Else[employer] Standard (2008-02-18 17:22:47  Leucovorin injection What is this medicine? LEUCOVORIN (loo koe VOR in) is used to prevent or treat the harmful effects of some medicines. This medicine is used  to treat anemia caused by a low amount of folic acid in the body. It is also used with 5-fluorouracil (5-FU) to treat colon cancer. This medicine may be used for other purposes; ask your health care provider or pharmacist if you have questions. What should I tell my health care provider before I take this medicine? They need to know if you have any of these conditions: -anemia from low  levels of vitamin B-12 in the blood -an unusual or allergic reaction to leucovorin, folic acid, other medicines, foods, dyes, or preservatives -pregnant or trying to get pregnant -breast-feeding How should I use this medicine? This medicine is for injection into a muscle or into a vein. It is given by a health care professional in a hospital or clinic setting. Talk to your pediatrician regarding the use of this medicine in children. Special care may be needed. Overdosage: If you think you have taken too much of this medicine contact a poison control center or emergency room at once. NOTE: This medicine is only for you. Do not share this medicine with others. What if I miss a dose? This does not apply. What may interact with this medicine? -capecitabine -fluorouracil -phenobarbital -phenytoin -primidone -trimethoprim-sulfamethoxazole This list may not describe all possible interactions. Give your health care provider a list of all the medicines, herbs, non-prescription drugs, or dietary supplements you use. Also tell them if you smoke, drink alcohol, or use illegal drugs. Some items may interact with your medicine. What should I watch for while using this medicine? Your condition will be monitored carefully while you are receiving this medicine. This medicine may increase the side effects of 5-fluorouracil, 5-FU. Tell your doctor or health care professional if you have diarrhea or mouth sores that do not get better or that get worse. What side effects may I notice from receiving this medicine? Side effects that you should report to your doctor or health care professional as soon as possible: -allergic reactions like skin rash, itching or hives, swelling of the face, lips, or tongue -breathing problems -fever, infection -mouth sores -unusual bleeding or bruising -unusually weak or tired Side effects that usually do not require medical attention (report to your doctor or health care  professional if they continue or are bothersome): -constipation or diarrhea -loss of appetite -nausea, vomiting This list may not describe all possible side effects. Call your doctor for medical advice about side effects. You may report side effects to FDA at 1-800-FDA-1088. Where should I keep my medicine? This drug is given in a hospital or clinic and will not be stored at home. NOTE: This sheet is a summary. It may not cover all possible information. If you have questions about this medicine, talk to your doctor, pharmacist, or health care provider.  2019 Elsevier/Gold Standard (2008-01-28 16:50:29)  Irinotecan injection What is this medicine? IRINOTECAN (ir in oh TEE kan ) is a chemotherapy drug. It is used to treat colon and rectal cancer. This medicine may be used for other purposes; ask your health care provider or pharmacist if you have questions. COMMON BRAND NAME(S): Camptosar What should I tell my health care provider before I take this medicine? They need to know if you have any of these conditions: -dehydration -diarrhea -infection (especially a virus infection such as chickenpox, cold sores, or herpes) -liver disease -low blood counts, like low white cell, platelet, or red cell counts -low levels of calcium, magnesium, or potassium in the blood -recent or ongoing radiation therapy -an unusual or  allergic reaction to irinotecan, other medicines, foods, dyes, or preservatives -pregnant or trying to get pregnant -breast-feeding How should I use this medicine? This drug is given as an infusion into a vein. It is administered in a hospital or clinic by a specially trained health care professional. Talk to your pediatrician regarding the use of this medicine in children. Special care may be needed. Overdosage: If you think you have taken too much of this medicine contact a poison control center or emergency room at once. NOTE: This medicine is only for you. Do not share this  medicine with others. What if I miss a dose? It is important not to miss your dose. Call your doctor or health care professional if you are unable to keep an appointment. What may interact with this medicine? This medicine may interact with the following medications: -antiviral medicines for HIV or AIDS -certain antibiotics like rifampin or rifabutin -certain medicines for fungal infections like itraconazole, ketoconazole, posaconazole, and voriconazole -certain medicines for seizures like carbamazepine, phenobarbital, phenotoin -clarithromycin -gemfibrozil -nefazodone -St. John's Wort This list may not describe all possible interactions. Give your health care provider a list of all the medicines, herbs, non-prescription drugs, or dietary supplements you use. Also tell them if you smoke, drink alcohol, or use illegal drugs. Some items may interact with your medicine. What should I watch for while using this medicine? Your condition will be monitored carefully while you are receiving this medicine. You will need important blood work done while you are taking this medicine. This drug may make you feel generally unwell. This is not uncommon, as chemotherapy can affect healthy cells as well as cancer cells. Report any side effects. Continue your course of treatment even though you feel ill unless your doctor tells you to stop. In some cases, you may be given additional medicines to help with side effects. Follow all directions for their use. You may get drowsy or dizzy. Do not drive, use machinery, or do anything that needs mental alertness until you know how this medicine affects you. Do not stand or sit up quickly, especially if you are an older patient. This reduces the risk of dizzy or fainting spells. Call your doctor or health care professional for advice if you get a fever, chills or sore throat, or other symptoms of a cold or flu. Do not treat yourself. This drug decreases your body's ability  to fight infections. Try to avoid being around people who are sick. This medicine may increase your risk to bruise or bleed. Call your doctor or health care professional if you notice any unusual bleeding. Be careful brushing and flossing your teeth or using a toothpick because you may get an infection or bleed more easily. If you have any dental work done, tell your dentist you are receiving this medicine. Avoid taking products that contain aspirin, acetaminophen, ibuprofen, naproxen, or ketoprofen unless instructed by your doctor. These medicines may hide a fever. Do not become pregnant while taking this medicine. Women should inform their doctor if they wish to become pregnant or think they might be pregnant. There is a potential for serious side effects to an unborn child. Talk to your health care professional or pharmacist for more information. Do not breast-feed an infant while taking this medicine. What side effects may I notice from receiving this medicine? Side effects that you should report to your doctor or health care professional as soon as possible: -allergic reactions like skin rash, itching or hives, swelling of the  face, lips, or tongue -chest pain -diarrhea -flushing, runny nose, sweating during infusion -low blood counts - this medicine may decrease the number of white blood cells, red blood cells and platelets. You may be at increased risk for infections and bleeding. -nausea, vomiting -pain, swelling, warmth in the leg -signs of decreased platelets or bleeding - bruising, pinpoint red spots on the skin, black, tarry stools, blood in the urine -signs of infection - fever or chills, cough, sore throat, pain or difficulty passing urine -signs of decreased red blood cells - unusually weak or tired, fainting spells, lightheadedness Side effects that usually do not require medical attention (report to your doctor or health care professional if they continue or are  bothersome): -constipation -hair loss -headache -loss of appetite -mouth sores -stomach pain This list may not describe all possible side effects. Call your doctor for medical advice about side effects. You may report side effects to FDA at 1-800-FDA-1088. Where should I keep my medicine? This drug is given in a hospital or clinic and will not be stored at home. NOTE: This sheet is a summary. It may not cover all possible information. If you have questions about this medicine, talk to your doctor, pharmacist, or health care provider.  2019 Else[employer]/Gold Standard (2017-10-09 15:02:58)  Fluorouracil, 5-FU injection What is this medicine? FLUOROURACIL, 5-FU (flure oh YOOR a sil) is a chemotherapy drug. It slows the growth of cancer cells. This medicine is used to treat many types of cancer like breast cancer, colon or rectal cancer, pancreatic cancer, and stomach cancer. This medicine may be used for other purposes; ask your health care provider or pharmacist if you have questions. COMMON BRAND NAME(S): Adrucil What should I tell my health care provider before I take this medicine? They need to know if you have any of these conditions: -blood disorders -dihydropyrimidine dehydrogenase (DPD) deficiency -infection (especially a virus infection such as chickenpox, cold sores, or herpes) -kidney disease -liver disease -malnourished, poor nutrition -recent or ongoing radiation therapy -an unusual or allergic reaction to fluorouracil, other chemotherapy, other medicines, foods, dyes, or preservatives -pregnant or trying to get pregnant -breast-feeding How should I use this medicine? This drug is given as an infusion or injection into a vein. It is administered in a hospital or clinic by a specially trained health care professional. Talk to your pediatrician regarding the use of this medicine in children. Special care may be needed. Overdosage: If you think you have taken too much of this  medicine contact a poison control center or emergency room at once. NOTE: This medicine is only for you. Do not share this medicine with others. What if I miss a dose? It is important not to miss your dose. Call your doctor or health care professional if you are unable to keep an appointment. What may interact with this medicine? -allopurinol -cimetidine -dapsone -digoxin -hydroxyurea -leucovorin -levamisole -medicines for seizures like ethotoin, fosphenytoin, phenytoin -medicines to increase blood counts like filgrastim, pegfilgrastim, sargramostim -medicines that treat or prevent blood clots like warfarin, enoxaparin, and dalteparin -methotrexate -metronidazole -pyrimethamine -some other chemotherapy drugs like busulfan, cisplatin, estramustine, vinblastine -trimethoprim -trimetrexate -vaccines Talk to your doctor or health care professional before taking any of these medicines: -acetaminophen -aspirin -ibuprofen -ketoprofen -naproxen This list may not describe all possible interactions. Give your health care provider a list of all the medicines, herbs, non-prescription drugs, or dietary supplements you use. Also tell them if you smoke, drink alcohol, or use illegal drugs. Some items may interact  with your medicine. What should I watch for while using this medicine? Visit your doctor for checks on your progress. This drug may make you feel generally unwell. This is not uncommon, as chemotherapy can affect healthy cells as well as cancer cells. Report any side effects. Continue your course of treatment even though you feel ill unless your doctor tells you to stop. In some cases, you may be given additional medicines to help with side effects. Follow all directions for their use. Call your doctor or health care professional for advice if you get a fever, chills or sore throat, or other symptoms of a cold or flu. Do not treat yourself. This drug decreases your body's ability to fight  infections. Try to avoid being around people who are sick. This medicine may increase your risk to bruise or bleed. Call your doctor or health care professional if you notice any unusual bleeding. Be careful brushing and flossing your teeth or using a toothpick because you may get an infection or bleed more easily. If you have any dental work done, tell your dentist you are receiving this medicine. Avoid taking products that contain aspirin, acetaminophen, ibuprofen, naproxen, or ketoprofen unless instructed by your doctor. These medicines may hide a fever. Do not become pregnant while taking this medicine. Women should inform their doctor if they wish to become pregnant or think they might be pregnant. There is a potential for serious side effects to an unborn child. Talk to your health care professional or pharmacist for more information. Do not breast-feed an infant while taking this medicine. Men should inform their doctor if they wish to father a child. This medicine may lower sperm counts. Do not treat diarrhea with over the counter products. Contact your doctor if you have diarrhea that lasts more than 2 days or if it is severe and watery. This medicine can make you more sensitive to the sun. Keep out of the sun. If you cannot avoid being in the sun, wear protective clothing and use sunscreen. Do not use sun lamps or tanning beds/booths. What side effects may I notice from receiving this medicine? Side effects that you should report to your doctor or health care professional as soon as possible: -allergic reactions like skin rash, itching or hives, swelling of the face, lips, or tongue -low blood counts - this medicine may decrease the number of white blood cells, red blood cells and platelets. You may be at increased risk for infections and bleeding. -signs of infection - fever or chills, cough, sore throat, pain or difficulty passing urine -signs of decreased platelets or bleeding - bruising,  pinpoint red spots on the skin, black, tarry stools, blood in the urine -signs of decreased red blood cells - unusually weak or tired, fainting spells, lightheadedness -breathing problems -changes in vision -chest pain -mouth sores -nausea and vomiting -pain, swelling, redness at site where injected -pain, tingling, numbness in the hands or feet -redness, swelling, or sores on hands or feet -stomach pain -unusual bleeding Side effects that usually do not require medical attention (report to your doctor or health care professional if they continue or are bothersome): -changes in finger or toe nails -diarrhea -dry or itchy skin -hair loss -headache -loss of appetite -sensitivity of eyes to the light -stomach upset -unusually teary eyes This list may not describe all possible side effects. Call your doctor for medical advice about side effects. You may report side effects to FDA at 1-800-FDA-1088. Where should I keep my medicine?  This drug is given in a hospital or clinic and will not be stored at home. NOTE: This sheet is a summary. It may not cover all possible information. If you have questions about this medicine, talk to your doctor, pharmacist, or health care provider.  2019 Elsevier/Gold Standard (2007-11-27 13:53:16)

## 2018-12-28 ENCOUNTER — Inpatient Hospital Stay: Payer: BC Managed Care – PPO

## 2018-12-28 VITALS — BP 129/97 | HR 91 | Temp 98.9°F | Resp 16

## 2018-12-28 DIAGNOSIS — C252 Malignant neoplasm of tail of pancreas: Secondary | ICD-10-CM

## 2018-12-28 DIAGNOSIS — Z7189 Other specified counseling: Secondary | ICD-10-CM

## 2018-12-28 MED ORDER — PEGFILGRASTIM-CBQV 6 MG/0.6ML ~~LOC~~ SOSY
PREFILLED_SYRINGE | SUBCUTANEOUS | Status: AC
  Filled 2018-12-28: qty 0.6

## 2018-12-28 MED ORDER — HEPARIN SOD (PORK) LOCK FLUSH 100 UNIT/ML IV SOLN
500.0000 [IU] | Freq: Once | INTRAVENOUS | Status: AC | PRN
  Administered 2018-12-28: 500 [IU]
  Filled 2018-12-28: qty 5

## 2018-12-28 MED ORDER — PEGFILGRASTIM-CBQV 6 MG/0.6ML ~~LOC~~ SOSY
6.0000 mg | PREFILLED_SYRINGE | Freq: Once | SUBCUTANEOUS | Status: AC
  Administered 2018-12-28: 6 mg via SUBCUTANEOUS

## 2018-12-28 MED ORDER — SODIUM CHLORIDE 0.9% FLUSH
10.0000 mL | INTRAVENOUS | Status: DC | PRN
  Administered 2018-12-28: 10 mL
  Filled 2018-12-28: qty 10

## 2018-12-28 NOTE — Patient Instructions (Signed)
Pegfilgrastim injection Vincent [name]) What is this medicine? PEGFILGRASTIM (PEG fil gra stim) is a long-acting granulocyte colony-stimulating factor that stimulates the growth of neutrophils, a type of white blood cell important in the body's fight against infection. It is used to reduce the incidence of fever and infection in patients with certain types of cancer who are receiving chemotherapy that affects the bone marrow, and to increase survival after being exposed to high doses of radiation. This medicine may be used for other purposes; ask your health care provider or pharmacist if you have questions. COMMON BRAND NAME(S): Domenic Moras, UDENYCA What should I tell my health care provider before I take this medicine? They need to know if you have any of these conditions: -kidney disease -latex allergy -ongoing radiation therapy -sickle cell disease -skin reactions to acrylic adhesives (On-Body Injector only) -an unusual or allergic reaction to pegfilgrastim, filgrastim, other medicines, foods, dyes, or preservatives -pregnant or trying to get pregnant -breast-feeding How should I use this medicine? This medicine is for injection under the skin. If you get this medicine at home, you will be taught how to prepare and give the pre-filled syringe or how to use the On-body Injector. Refer to the patient Instructions for Use for detailed instructions. Use exactly as directed. Tell your healthcare provider immediately if you suspect that the On-body Injector may not have performed as intended or if you suspect the use of the On-body Injector resulted in a missed or partial dose. It is important that you put your used needles and syringes in a special sharps container. Do not put them in a trash can. If you do not have a sharps container, call your pharmacist or healthcare provider to get one. Talk to your pediatrician regarding the use of this medicine in children. While this drug may be prescribed  for selected conditions, precautions do apply. Overdosage: If you think you have taken too much of this medicine contact a poison control center or emergency room at once. NOTE: This medicine is only for you. Do not share this medicine with others. What if I miss a dose? It is important not to miss your dose. Call your doctor or health care professional if you miss your dose. If you miss a dose due to an On-body Injector failure or leakage, a new dose should be administered as soon as possible using a single prefilled syringe for manual use. What may interact with this medicine? Interactions have not been studied. Give your health care provider a list of all the medicines, herbs, non-prescription drugs, or dietary supplements you use. Also tell them if you smoke, drink alcohol, or use illegal drugs. Some items may interact with your medicine. This list may not describe all possible interactions. Give your health care provider a list of all the medicines, herbs, non-prescription drugs, or dietary supplements you use. Also tell them if you smoke, drink alcohol, or use illegal drugs. Some items may interact with your medicine. What should I watch for while using this medicine? You may need blood work done while you are taking this medicine. If you are going to need a MRI, CT scan, or other procedure, tell your doctor that you are using this medicine (On-Body Injector only). What side effects may I notice from receiving this medicine? Side effects that you should report to your doctor or health care professional as soon as possible: -allergic reactions like skin rash, itching or hives, swelling of the face, lips, or tongue -back pain -dizziness -fever -pain,  redness, or irritation at site where injected -pinpoint red spots on the skin -red or dark-brown urine -shortness of breath or breathing problems -stomach or side pain, or pain at the shoulder -swelling -tiredness -trouble passing urine or  change in the amount of urine Side effects that usually do not require medical attention (report to your doctor or health care professional if they continue or are bothersome): -bone pain -muscle pain This list may not describe all possible side effects. Call your doctor for medical advice about side effects. You may report side effects to FDA at 1-800-FDA-1088. Where should I keep my medicine? Keep out of the reach of children. If you are using this medicine at home, you will be instructed on how to store it. Throw away any unused medicine after the expiration date on the label. NOTE: This sheet is a summary. It may not cover all possible information. If you have questions about this medicine, talk to your doctor, pharmacist, or health care provider.  2019 Elsevier/Gold Standard (2017-10-29 16:57:08)

## 2019-01-01 ENCOUNTER — Telehealth: Payer: Self-pay

## 2019-01-01 ENCOUNTER — Telehealth: Payer: Self-pay | Admitting: Nurse Practitioner

## 2019-01-01 NOTE — Telephone Encounter (Signed)
I returned call to Mr. Paino to f/u on his wife's concern that he is lethargic and weak. Mr. Boxley sounded upbeat upon answering the phone, stating he feels better and has more energy since eating potassium-rich foods (bananas, oranges, nuts). He is drinking well. Denies n/v/d. He is able to function well with rest periods. We clarified anti-emetics. Also takes ginger root. He denies further needs at this time. I encouraged him to call to seek in-person eval if he has new or worsening concerns. He appreciates the call.  Cira Rue, NP  01/01/19

## 2019-01-01 NOTE — Telephone Encounter (Signed)
Patient's wife Vincent [name] calls stating that he is not having a good weeks, appetite is not great, denies nausea/vomiting/diarrhea, she is making him drink.  Biggest concern is that he is so lethargic and weak.  Advised her I will speak with Dr. Burr Medico and call her back at [phone]

## 2019-01-06 NOTE — Progress Notes (Signed)
[place]   Telephone:(336) [phone] Fax:(336) [phone]   Clinic Follow up Note   Patient Care Team: Leonard Downing, MD as PCP - General (Family Medicine)  Date of Service:  01/08/2019  CHIEF COMPLAINT: F/u of Pancreatic Cancer  SUMMARY OF ONCOLOGIC HISTORY: Oncology History   Cancer Staging Pancreatic cancer [facility]) Staging form: Exocrine Pancreas, AJCC 8th Edition - Clinical stage from 10/29/2018: Stage IV (cT3, cN0, cM1) - Signed by Truitt Merle, MD on 11/05/2018       Pancreatic cancer ([place])   10/09/2018 Procedure    Upper Endoscopy by. Dr Paulita Fujita 10/29/18 IMPRESSION - There was no evidence of significant pathology in the left lobe of the liver. - There was no sign of significant pathology in the gallbladder. - Many abnormal lymph nodes were visualized in the splenic region (level 19), perigastric region and peripancreatic region. - A mass was identified in the pancreatic tail. This was staged T4 N2 Mx by endosonographic criteria. Fine needle aspiration performed.    10/10/2018 Imaging    CT Abdomen 10/10/18 IMPRESSION: 1. Uncomplicated acute diverticulitis of the proximal sigmoid colon. 2. Ill-defined low-density in the pancreatic tail measuring at least 4.7 cm, with possible punctate peripheral calcifications. This is suspicious for pancreatic neoplasm, however sequela of prior pancreatitis is also considered. Recommend further characterization with pancreatic protocol MRI when patient is able to tolerate breath hold technique. 3. 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 4. 16 mm cyst in the left lobe of the liver. Aortic Atherosclerosis (ICD10-I70.0).    10/16/2018 Imaging    MRI Abdomen at [facility] 10/16/18 IMPRESSION: 1. Mass in the tail the pancreas concerning for neoplasm. This contacts the spleen in the splenic hilum. 2. There is stranding in the fat extending from the tumor to the  celiac axis and left adrenal gland. Cannot exclude infiltrating tumor. 3. 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 4. metastatic lesion in the liver    10/29/2018 Initial Biopsy    Diagnosis 10/29/18 FINE NEEDLE ASPIRATION, ENDOSCOPIC, PANCREAS TAIL(SPECIMEN 1 OF 1 COLLECTED 10/29/18): MALIGNANT CELLS PRESENT, CONSISTENT WITH ADENOCARCINOMA. SEE COMMENT. COMMENT: THERE IS INSUFFICIENT TUMOR PRESENT FOR ADDITIONAL STUDIES.    10/29/2018 Cancer Staging    Staging form: Exocrine Pancreas, AJCC 8th Edition - Clinical stage from 10/29/2018: Stage IV (cT3, cN0, cM1) - Signed by Truitt Merle, MD on 11/05/2018    11/04/2018 Initial Diagnosis    Pancreatic cancer ([place])    11/27/2018 Imaging    CT Chest  IMPRESSION: 1.  No acute process or evidence of metastatic disease in the chest. 2. Locally advanced pancreatic carcinoma, as detailed previously. Cannot exclude superimposed pancreatitis. 3. New and enlarging liver lesions, suspicious for hepatic metastasis. 4. Aortic atherosclerosis (ICD10-I70.0), coronary artery atherosclerosis and emphysema (ICD10-J43.9).    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  The Common Hereditary Gene Panel offered by [facility] includes sequencing and/or deletion duplication testing of the following 48 genes: APC, ATM, AXIN2, BARD1, BMPR1A, BRCA1, BRCA2, BRIP1, CDH1, CDK4, CDKN2A (p14ARF), CDKN2A (p16INK4a), CHEK2, CTNNA1, DICER1, EPCAM (Deletion/duplication testing only), GREM1 (promoter region deletion/duplication testing only), KIT, MEN1, MLH1, MSH2, MSH3, MSH6, MUTYH, NBN, NF1, NHTL1, PALB2, PDGFRA, PMS2, POLD1, POLE, PTEN, RAD50, RAD51C, RAD51D, RNF43, SDHB, SDHC, SDHD, SMAD4, SMARCA4. STK11, TP53, TSC1, TSC2,  and VHL.  The following genes were evaluated for  sequence changes only: SDHA and HOXB13 c.251G>A variant only. The report date is December 04, 2018.,      CURRENT THERAPY:  First lineFOLFIRINOX q2weeks starting4/23/20  INTERVAL HISTORY:  Vincent [name] is here for a follow up and treatment. He presents to the clinic alone. He notes he is doing well overall. His wife called last week about his fatigue and low appetite. He has been able to gain weight back and eating better since then. He is eating more potassium in his diet.  He started feeling fatigued of day 2-3 of treatment. He denies nausea. This lasted about 9-10 days. He denies neuropathy, but he does have cold sensitivity. He notes he was able to drink fluids and stay hydrated. He notes his abdominal pain is currently gone. He takes very little pain tramadol as needed due to soreness from yard work. He notes he is not taking NORCO or oxycodone. I reviewed his medication list with him. He notes he got results from genetic testing and wonders does his sons need to be tested.  He notes he will leave for the beach on 5/27 and return 7/4.     REVIEW OF SYSTEMS:   Constitutional: Denies fevers, chills or abnormal weight loss Eyes: Denies blurriness of vision Ears, nose, mouth, throat, and face: Denies mucositis or sore throat Respiratory: Denies cough, dyspnea or wheezes Cardiovascular: Denies palpitation, chest discomfort or lower extremity swelling Gastrointestinal:  Denies nausea, heartburn or change in bowel habits Skin: Denies abnormal skin rashes Lymphatics: Denies new lymphadenopathy or easy bruising Neurological:Denies numbness, tingling or new weaknesses (+) Occasional cold sensitivity Behavioral/Psych: Mood is stable, no new changes  All other systems were reviewed with the patient and are negative.  MEDICAL HISTORY:  Past Medical History:  Diagnosis Date   Diabetes mellitus without complication ([place])    pt. states that he is not diabeticalthought he takes metformin     Eye hemorrhage    right   Hypertension     SURGICAL HISTORY: Past Surgical History:  Procedure Laterality Date   ESOPHAGOGASTRODUODENOSCOPY (EGD) WITH PROPOFOL N/A 10/29/2018   Procedure: ESOPHAGOGASTRODUODENOSCOPY (EGD) WITH PROPOFOL;  Surgeon: Arta Silence, MD;  Location: [facility];  Service: Endoscopy;  Laterality: N/A;   EUS N/A 10/29/2018   Procedure: FULL UPPER ENDOSCOPIC ULTRASOUND (EUS) RADIAL;  Surgeon: Arta Silence, MD;  Location: [facility];  Service: Endoscopy;  Laterality: N/A;   FINE NEEDLE ASPIRATION N/A 10/29/2018   Procedure: FINE NEEDLE ASPIRATION (FNA) LINEAR;  Surgeon: Arta Silence, MD;  Location: [facility];  Service: Endoscopy;  Laterality: N/A;   IR IMAGING GUIDED PORT INSERTION  11/11/2018   LUMBAR LAMINECTOMY/DECOMPRESSION MICRODISCECTOMY Left 10/11/2017   Procedure: Microlumbar decompression Lumbar Four-Five Left;  Surgeon: Susa Day, MD;  Location: [place];  Service: Orthopedics;  Laterality: Left;  Microlumbar decompression Lumbar Four-Five Left   removal of wisdom teeth  1977    I have reviewed the social history and family history with the patient and they are unchanged from previous note.  ALLERGIES:  has No Known Allergies.  MEDICATIONS:  Current Outpatient Medications  Medication Sig Dispense Refill   Accu-Chek FastClix Lancets MISC daily. for testing     aspirin EC 81 MG tablet Take 1 tablet (81 mg total) by mouth daily after breakfast. Resume 4 days post-op     Ginger, Zingiber officinalis, (GINGER ROOT PO) Take 750 mg by mouth.     HYDROcodone-acetaminophen (NORCO) 5-325 MG tablet Take  1 tablet by mouth every 6 (six) hours as needed for moderate pain. 30 tablet 0   lidocaine-prilocaine (EMLA) cream Apply to affected area once 30 g 3   lisinopril (PRINIVIL,ZESTRIL) 10 MG tablet Take 10 mg by mouth daily after breakfast.     metFORMIN (GLUCOPHAGE) 1000 MG tablet TAKE 1 TABLET BY MOUTH AT BREAKFAST AND 1/2 TABLET AT  SUPPER     ondansetron (ZOFRAN) 8 MG tablet Take 1 tablet (8 mg total) by mouth 2 (two) times daily as needed for refractory nausea / vomiting. Start on day 3 after chemotherapy. 30 tablet 1   oxyCODONE (OXY IR/ROXICODONE) 5 MG immediate release tablet Take 1-2 tablets (5-10 mg total) by mouth every 6 (six) hours as needed for severe pain. 45 tablet 0   pantoprazole (PROTONIX) 40 MG tablet Take 1 tablet (40 mg total) by mouth daily. 30 tablet 0   potassium chloride SA (K-DUR) 20 MEQ tablet Take 1 tablet (20 mEq total) by mouth every other day. 30 tablet 0   prochlorperazine (COMPAZINE) 10 MG tablet TAKE 1 TABLET (10 MG TOTAL) BY MOUTH EVERY 6 (SIX) HOURS AS NEEDED (NAUSEA). 30 tablet 1   simvastatin (ZOCOR) 40 MG tablet Take 20 mg by mouth every evening.     traMADol (ULTRAM) 50 MG tablet Take 2 tablets (100 mg total) by mouth every 6 (six) hours as needed. 120 tablet 1   No current facility-administered medications for this visit.     PHYSICAL EXAMINATION: ECOG PERFORMANCE STATUS: 1 - Symptomatic but completely ambulatory  Vitals:   01/08/19 0850  BP: 122/88  Pulse: 87  Resp: 16  Temp: 98 F (36.7 C)  SpO2: 97%   Filed Weights   01/08/19 0850  Weight: 217 lb 1.6 oz (98.5 kg)    GENERAL:alert, no distress and comfortable SKIN: skin color, texture, turgor are normal, no rashes or significant lesions EYES: normal, Conjunctiva are pink and non-injected, sclera clear  NECK: supple, thyroid normal size, non-tender, without nodularity LYMPH:  no palpable lymphadenopathy in the cervical, axillary  LUNGS: clear to auscultation and percussion with normal breathing effort HEART: regular rate & rhythm and no murmurs and no lower extremity edema ABDOMEN:abdomen soft, non-tender and normal bowel sounds Musculoskeletal:no cyanosis of digits and no clubbing  NEURO: alert & oriented x 3 with fluent speech, no focal motor/sensory deficits  LABORATORY DATA:  I have reviewed the data as  listed CBC Latest Ref Rng & Units 01/08/2019 12/26/2018 12/12/2018  WBC 4.0 - 10.5 K/uL 8.8 7.3 8.5  Hemoglobin 13.0 - 17.0 g/dL 11.6(L) 12.7(L) 12.8(L)  Hematocrit 39.0 - 52.0 % 35.6(L) 38.2(L) 38.5(L)  Platelets 150 - 400 K/uL 141(L) 217 185     CMP Latest Ref Rng & Units 01/08/2019 12/26/2018 12/12/2018  Glucose 70 - 99 mg/dL 178(H) 150(H) 135(H)  BUN 8 - 23 mg/dL '14 14 10  ' Creatinine 0.61 - 1.24 mg/dL 0.81 0.90 0.85  Sodium 135 - 145 mmol/L 141 140 140  Potassium 3.5 - 5.1 mmol/L 3.7 3.4(L) 3.5  Chloride 98 - 111 mmol/L 108 105 106  CO2 22 - 32 mmol/L '23 25 24  ' Calcium 8.9 - 10.3 mg/dL 8.7(L) 8.9 8.8(L)  Total Protein 6.5 - 8.1 g/dL 6.4(L) 6.4(L) 6.5  Total Bilirubin 0.3 - 1.2 mg/dL 0.4 0.3 0.4  Alkaline Phos 38 - 126 U/L 121 94 93  AST 15 - 41 U/L '19 22 18  ' ALT 0 - 44 U/L 41 50(H) 34      RADIOGRAPHIC STUDIES:  I have personally reviewed the radiological images as listed and agreed with the findings in the report. No results found.   ASSESSMENT & PLAN:  Vincent [name] is a 68 y.o. male with   1. Adenocarcinoma oftail of Pancreas, cT3N0Mx, with liver metastasis -He was recently diagnosedin3/2020.  -His CT APand abdominal MRI showed a 1.6cm lesion in thedorm, also it appears likely a benign cyst on the CT scan, however the MRI features are concerning for livermetastasis. -His baseline CA 19-9 was significantly elevated at 3,683 (11/05/18). This is alsoconcerningfor metastatic disease. -I discussed his CT Chest from 11/27/18 shows no acute process or evidence of metastatic disease in chest. It also showed growth of known liver lesions(2.9cm)along with a new 1.6cmliver lesionin inferior right lobe,which are highly suspicious for metastasis.  -I discussed to confirm with definitive prognosis we can do liver biopsy. Given thetypical scan findings and growth over time, the liver lesions are likely metastatic lesions. Given the current COVID-19,I will probablyskip  biopsy. He agreed.  -I discussed if metastatic disease, he is no longer a surgical candidate and his cancer is no longer curable. Goal of therapy would be palliative toprolong his life and improve his quality of life. He understands. -He started first-line FOLFIRINOX every 2 weeks on 11/28/18. He I tolerating moderately well overall with fatigued and lowered appetite and cold sensitivity. Will watch for neuropathy. We discussed side effects will accumulate, and I encourage him to drink glucerna when his appetite is low after chemo  -Labs reviewed, CBC WNL except Hg 11.6, PLT 141K, CMP WNL except BG 178. CA 19.9 still pending. Overall adequate to proceed with cycle 4 FOLFIRINOX today.  -Plan to repeat CT scan after cycle 5.  -f/u in 2 weeks for cycle 5 chemo  -he has a family trip in later June, will postpone cycle for a week    2. Epigastricand LUQpainradiating to his back -secondary topancreaticcancer -Ipreviouslyprescribed him NORCObut does not feel it helps as much as Tramadol. I advised him not to go over 8 tabs Tramadol in a day. -Abdominal pain has revolved since he started chemo, back pain manageable with tramadol and tylenol. He is not taking norco or oxycodone.   3. Lower appetite, Weight loss  -Eating exacerbates his epigastric pain and nausea -He has lost 40 pounds when he was diagnosed  -Appetite fluctuating with chemo. He is able to recover and weight stabilized. He drinks protein shakes like Glucerna, supplements, and flavored water.  -Continue tofollow up with Dietician  4. DM, HTN  -On Metformin and Lisinopril -We will monitor his blood pressure and blood glucose closely during the chemotherapy. May adjust medication during chemoif needed -Continue to f/u with PCP -Due to hypotension his Lisinopril was held starting 12/26/18. He can use only for elevated BP.  -BP normal today at 122/88 (01/08/19)  5. Hypokalemia  -will continue to increase potassium in his  diet.  -I previously started him on oral K (12/26/18). He will continue every other day.   6. 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  -Will discuss results with Santiago Glad    PLAN: -labs reviewed and adequate to proceed with C56mOLFIRINOX today with Udenyca on day 3 -lab, flush, f/u and chemo in 2 weeks  -will postpone cycle 6 for a week due to family trip  -CT ABDOMEN/PEL WITH CONTRAST on 7/6, before cycle 6     No problem-specific Assessment &  Plan notes found for this encounter.   No orders of the defined types were placed in this encounter.  All questions were answered. The patient knows to call the clinic with any problems, questions or concerns. No barriers to learning was detected. I spent 20 minutes counseling the patient face to face. The total time spent in the appointment was 25 minutes and more than 50% was on counseling and review of test results     Truitt Merle, MD 01/08/2019   I, Joslyn Devon, am acting as scribe for Truitt Merle, MD.   I have reviewed the above documentation for accuracy and completeness, and I agree with the above.

## 2019-01-07 ENCOUNTER — Other Ambulatory Visit: Payer: Self-pay | Admitting: Hematology

## 2019-01-07 DIAGNOSIS — C252 Malignant neoplasm of tail of pancreas: Secondary | ICD-10-CM

## 2019-01-08 ENCOUNTER — Other Ambulatory Visit: Payer: Self-pay

## 2019-01-08 ENCOUNTER — Ambulatory Visit: Payer: Self-pay | Admitting: Nurse Practitioner

## 2019-01-08 ENCOUNTER — Inpatient Hospital Stay (HOSPITAL_BASED_OUTPATIENT_CLINIC_OR_DEPARTMENT_OTHER): Payer: BC Managed Care – PPO | Admitting: Hematology

## 2019-01-08 ENCOUNTER — Inpatient Hospital Stay: Payer: BC Managed Care – PPO | Attending: Hematology

## 2019-01-08 ENCOUNTER — Encounter: Payer: Self-pay | Admitting: Hematology

## 2019-01-08 ENCOUNTER — Inpatient Hospital Stay: Payer: BC Managed Care – PPO

## 2019-01-08 VITALS — BP 122/88 | HR 87 | Temp 98.0°F | Resp 16 | Ht 76.0 in | Wt 217.1 lb

## 2019-01-08 DIAGNOSIS — Z1379 Encounter for other screening for genetic and chromosomal anomalies: Secondary | ICD-10-CM

## 2019-01-08 DIAGNOSIS — C250 Malignant neoplasm of head of pancreas: Secondary | ICD-10-CM

## 2019-01-08 DIAGNOSIS — C787 Secondary malignant neoplasm of liver and intrahepatic bile duct: Secondary | ICD-10-CM | POA: Diagnosis not present

## 2019-01-08 DIAGNOSIS — C252 Malignant neoplasm of tail of pancreas: Secondary | ICD-10-CM | POA: Insufficient documentation

## 2019-01-08 DIAGNOSIS — F5089 Other specified eating disorder: Secondary | ICD-10-CM | POA: Insufficient documentation

## 2019-01-08 DIAGNOSIS — E876 Hypokalemia: Secondary | ICD-10-CM | POA: Insufficient documentation

## 2019-01-08 DIAGNOSIS — Z79899 Other long term (current) drug therapy: Secondary | ICD-10-CM

## 2019-01-08 DIAGNOSIS — R42 Dizziness and giddiness: Secondary | ICD-10-CM | POA: Insufficient documentation

## 2019-01-08 DIAGNOSIS — Z5111 Encounter for antineoplastic chemotherapy: Secondary | ICD-10-CM | POA: Diagnosis present

## 2019-01-08 DIAGNOSIS — I10 Essential (primary) hypertension: Secondary | ICD-10-CM

## 2019-01-08 DIAGNOSIS — G893 Neoplasm related pain (acute) (chronic): Secondary | ICD-10-CM | POA: Diagnosis not present

## 2019-01-08 DIAGNOSIS — R55 Syncope and collapse: Secondary | ICD-10-CM | POA: Diagnosis not present

## 2019-01-08 DIAGNOSIS — E119 Type 2 diabetes mellitus without complications: Secondary | ICD-10-CM | POA: Insufficient documentation

## 2019-01-08 DIAGNOSIS — Z5189 Encounter for other specified aftercare: Secondary | ICD-10-CM | POA: Diagnosis not present

## 2019-01-08 DIAGNOSIS — Z95828 Presence of other vascular implants and grafts: Secondary | ICD-10-CM

## 2019-01-08 DIAGNOSIS — Z7189 Other specified counseling: Secondary | ICD-10-CM

## 2019-01-08 LAB — CBC WITH DIFFERENTIAL (CANCER CENTER ONLY)
Abs Immature Granulocytes: 0.04 10*3/uL (ref 0.00–0.07)
Basophils Absolute: 0.1 10*3/uL (ref 0.0–0.1)
Basophils Relative: 1 %
Eosinophils Absolute: 0.1 10*3/uL (ref 0.0–0.5)
Eosinophils Relative: 1 %
HCT: 35.6 % — ABNORMAL LOW (ref 39.0–52.0)
Hemoglobin: 11.6 g/dL — ABNORMAL LOW (ref 13.0–17.0)
Immature Granulocytes: 1 %
Lymphocytes Relative: 12 %
Lymphs Abs: 1.1 10*3/uL (ref 0.7–4.0)
MCH: 33.5 pg (ref 26.0–34.0)
MCHC: 32.6 g/dL (ref 30.0–36.0)
MCV: 102.9 fL — ABNORMAL HIGH (ref 80.0–100.0)
Monocytes Absolute: 0.7 10*3/uL (ref 0.1–1.0)
Monocytes Relative: 8 %
Neutro Abs: 6.8 10*3/uL (ref 1.7–7.7)
Neutrophils Relative %: 77 %
Platelet Count: 141 10*3/uL — ABNORMAL LOW (ref 150–400)
RBC: 3.46 MIL/uL — ABNORMAL LOW (ref 4.22–5.81)
RDW: 15.8 % — ABNORMAL HIGH (ref 11.5–15.5)
WBC Count: 8.8 10*3/uL (ref 4.0–10.5)
nRBC: 0 % (ref 0.0–0.2)

## 2019-01-08 LAB — CMP (CANCER CENTER ONLY)
ALT: 41 U/L (ref 0–44)
AST: 19 U/L (ref 15–41)
Albumin: 3.4 g/dL — ABNORMAL LOW (ref 3.5–5.0)
Alkaline Phosphatase: 121 U/L (ref 38–126)
Anion gap: 10 (ref 5–15)
BUN: 14 mg/dL (ref 8–23)
CO2: 23 mmol/L (ref 22–32)
Calcium: 8.7 mg/dL — ABNORMAL LOW (ref 8.9–10.3)
Chloride: 108 mmol/L (ref 98–111)
Creatinine: 0.81 mg/dL (ref 0.61–1.24)
GFR, Est AFR Am: >60 mL/min (ref >60)
GFR, Estimated: >60 mL/min (ref >60)
Glucose, Bld: 178 mg/dL — ABNORMAL HIGH (ref 70–99)
Potassium: 3.7 mmol/L (ref 3.5–5.1)
Sodium: 141 mmol/L (ref 135–145)
Total Bilirubin: 0.4 mg/dL (ref 0.3–1.2)
Total Protein: 6.4 g/dL — ABNORMAL LOW (ref 6.5–8.1)

## 2019-01-08 MED ORDER — IRINOTECAN HCL CHEMO INJECTION 100 MG/5ML
150.0000 mg/m2 | Freq: Once | INTRAVENOUS | Status: AC
  Administered 2019-01-08: 14:00:00 360 mg via INTRAVENOUS
  Filled 2019-01-08: qty 15

## 2019-01-08 MED ORDER — SODIUM CHLORIDE 0.9% FLUSH
10.0000 mL | INTRAVENOUS | Status: DC | PRN
  Filled 2019-01-08: qty 10

## 2019-01-08 MED ORDER — DEXTROSE 5 % IV SOLN
Freq: Once | INTRAVENOUS | Status: AC
  Administered 2019-01-08: 10:00:00 via INTRAVENOUS
  Filled 2019-01-08: qty 250

## 2019-01-08 MED ORDER — ATROPINE SULFATE 0.4 MG/ML IJ SOLN
INTRAMUSCULAR | Status: AC
  Filled 2019-01-08: qty 1

## 2019-01-08 MED ORDER — ATROPINE SULFATE 0.4 MG/ML IJ SOLN
0.4000 mg | Freq: Once | INTRAMUSCULAR | Status: AC | PRN
  Administered 2019-01-08: 14:00:00 0.4 mg via INTRAVENOUS

## 2019-01-08 MED ORDER — SODIUM CHLORIDE 0.9 % IV SOLN
Freq: Once | INTRAVENOUS | Status: AC
  Administered 2019-01-08: 10:00:00 via INTRAVENOUS
  Filled 2019-01-08: qty 5

## 2019-01-08 MED ORDER — ATROPINE SULFATE 1 MG/ML IJ SOLN: 0.4000 mg | Freq: Once | INTRAMUSCULAR | Status: DC

## 2019-01-08 MED ORDER — SODIUM CHLORIDE 0.9 % IV SOLN
2400.0000 mg/m2 | INTRAVENOUS | Status: DC
  Administered 2019-01-08: 16:00:00 5850 mg via INTRAVENOUS
  Filled 2019-01-08: qty 117

## 2019-01-08 MED ORDER — ATROPINE SULFATE 1 MG/ML IJ SOLN: 0.5000 mg | Freq: Once | INTRAMUSCULAR | Status: DC | PRN

## 2019-01-08 MED ORDER — LEUCOVORIN CALCIUM INJECTION 350 MG
400.0000 mg/m2 | Freq: Once | INTRAVENOUS | Status: AC
  Administered 2019-01-08: 14:00:00 976 mg via INTRAVENOUS
  Filled 2019-01-08: qty 48.8

## 2019-01-08 MED ORDER — HEPARIN SOD (PORK) LOCK FLUSH 100 UNIT/ML IV SOLN
500.0000 [IU] | Freq: Once | INTRAVENOUS | Status: DC | PRN
  Filled 2019-01-08: qty 5

## 2019-01-08 MED ORDER — OXALIPLATIN CHEMO INJECTION 100 MG/20ML
200.0000 mg | Freq: Once | INTRAVENOUS | Status: AC
  Administered 2019-01-08: 11:00:00 200 mg via INTRAVENOUS
  Filled 2019-01-08: qty 40

## 2019-01-08 MED ORDER — SODIUM CHLORIDE 0.9% FLUSH
10.0000 mL | Freq: Once | INTRAVENOUS | Status: AC
  Administered 2019-01-08: 10 mL
  Filled 2019-01-08: qty 10

## 2019-01-08 MED ORDER — PALONOSETRON HCL INJECTION 0.25 MG/5ML
0.2500 mg | Freq: Once | INTRAVENOUS | Status: AC
  Administered 2019-01-08: 10:00:00 0.25 mg via INTRAVENOUS

## 2019-01-08 MED ORDER — PALONOSETRON HCL INJECTION 0.25 MG/5ML
INTRAVENOUS | Status: AC
  Filled 2019-01-08: qty 5

## 2019-01-08 NOTE — Addendum Note (Signed)
Addended by: Truitt Merle on: 01/08/2019 08:51 PM   Modules accepted: Orders

## 2019-01-08 NOTE — Patient Instructions (Signed)
[facility] Discharge Instructions for Patients Receiving Chemotherapy  Today you received the following chemotherapy agents:  Oxaliplatin, Leucovorin, Camptosar, Fluorouracil, Pump To help prevent nausea and vomiting after your treatment, we encourage you to take your nausea medication as prescribed.    If you develop nausea and vomiting that is not controlled by your nausea medication, call the clinic.   BELOW ARE SYMPTOMS THAT SHOULD BE REPORTED IMMEDIATELY:  *FEVER GREATER THAN 100.5 F  *CHILLS WITH OR WITHOUT FEVER  NAUSEA AND VOMITING THAT IS NOT CONTROLLED WITH YOUR NAUSEA MEDICATION  *UNUSUAL SHORTNESS OF BREATH  *UNUSUAL BRUISING OR BLEEDING  TENDERNESS IN MOUTH AND THROAT WITH OR WITHOUT PRESENCE OF ULCERS  *URINARY PROBLEMS  *BOWEL PROBLEMS  UNUSUAL RASH Items with * indicate a potential emergency and should be followed up as soon as possible.  Feel free to call the clinic should you have any questions or concerns. The clinic phone number is [phone].  Please show the [facility] at check-in to the Emergency Department and triage nurse.  Oxaliplatin Injection What is this medicine? OXALIPLATIN (ox AL i PLA tin) is a chemotherapy drug. It targets fast dividing cells, like cancer cells, and causes these cells to die. This medicine is used to treat cancers of the colon and rectum, and many other cancers. This medicine may be used for other purposes; ask your health care provider or pharmacist if you have questions. COMMON BRAND NAME(S): Eloxatin What should I tell my health care provider before I take this medicine? They need to know if you have any of these conditions: -kidney disease -an unusual or allergic reaction to oxaliplatin, other chemotherapy, other medicines, foods, dyes, or preservatives -pregnant or trying to get pregnant -breast-feeding How should I use this medicine? This drug is given as an infusion into a vein. It is  administered in a hospital or clinic by a specially trained health care professional. Talk to your pediatrician regarding the use of this medicine in children. Special care may be needed. Overdosage: If you think you have taken too much of this medicine contact a poison control center or emergency room at once. NOTE: This medicine is only for you. Do not share this medicine with others. What if I miss a dose? It is important not to miss a dose. Call your doctor or health care professional if you are unable to keep an appointment. What may interact with this medicine? -medicines to increase blood counts like filgrastim, pegfilgrastim, sargramostim -probenecid -some antibiotics like amikacin, gentamicin, neomycin, polymyxin B, streptomycin, tobramycin -zalcitabine Talk to your doctor or health care professional before taking any of these medicines: -acetaminophen -aspirin -ibuprofen -ketoprofen -naproxen This list may not describe all possible interactions. Give your health care provider a list of all the medicines, herbs, non-prescription drugs, or dietary supplements you use. Also tell them if you smoke, drink alcohol, or use illegal drugs. Some items may interact with your medicine. What should I watch for while using this medicine? Your condition will be monitored carefully while you are receiving this medicine. You will need important blood work done while you are taking this medicine. This medicine can make you more sensitive to cold. Do not drink cold drinks or use ice. Cover exposed skin before coming in contact with cold temperatures or cold objects. When out in cold weather wear warm clothing and cover your mouth and nose to warm the air that goes into your lungs. Tell your doctor if you get sensitive to  the cold. This drug may make you feel generally unwell. This is not uncommon, as chemotherapy can affect healthy cells as well as cancer cells. Report any side effects. Continue your  course of treatment even though you feel ill unless your doctor tells you to stop. In some cases, you may be given additional medicines to help with side effects. Follow all directions for their use. Call your doctor or health care professional for advice if you get a fever, chills or sore throat, or other symptoms of a cold or flu. Do not treat yourself. This drug decreases your body's ability to fight infections. Try to avoid being around people who are sick. This medicine may increase your risk to bruise or bleed. Call your doctor or health care professional if you notice any unusual bleeding. Be careful brushing and flossing your teeth or using a toothpick because you may get an infection or bleed more easily. If you have any dental work done, tell your dentist you are receiving this medicine. Avoid taking products that contain aspirin, acetaminophen, ibuprofen, naproxen, or ketoprofen unless instructed by your doctor. These medicines may hide a fever. Do not become pregnant while taking this medicine. Women should inform their doctor if they wish to become pregnant or think they might be pregnant. There is a potential for serious side effects to an unborn child. Talk to your health care professional or pharmacist for more information. Do not breast-feed an infant while taking this medicine. Call your doctor or health care professional if you get diarrhea. Do not treat yourself. What side effects may I notice from receiving this medicine? Side effects that you should report to your doctor or health care professional as soon as possible: -allergic reactions like skin rash, itching or hives, swelling of the face, lips, or tongue -low blood counts - This drug may decrease the number of white blood cells, red blood cells and platelets. You may be at increased risk for infections and bleeding. -signs of infection - fever or chills, cough, sore throat, pain or difficulty passing urine -signs of decreased  platelets or bleeding - bruising, pinpoint red spots on the skin, black, tarry stools, nosebleeds -signs of decreased red blood cells - unusually weak or tired, fainting spells, lightheadedness -breathing problems -chest pain, pressure -cough -diarrhea -jaw tightness -mouth sores -nausea and vomiting -pain, swelling, redness or irritation at the injection site -pain, tingling, numbness in the hands or feet -problems with balance, talking, walking -redness, blistering, peeling or loosening of the skin, including inside the mouth -trouble passing urine or change in the amount of urine Side effects that usually do not require medical attention (report to your doctor or health care professional if they continue or are bothersome): -changes in vision -constipation -hair loss -loss of appetite -metallic taste in the mouth or changes in taste -stomach pain This list may not describe all possible side effects. Call your doctor for medical advice about side effects. You may report side effects to FDA at 1-800-FDA-1088. Where should I keep my medicine? This drug is given in a hospital or clinic and will not be stored at home. NOTE: This sheet is a summary. It may not cover all possible information. If you have questions about this medicine, talk to your doctor, pharmacist, or health care provider.  2019 Elsevier/Gold Standard (2008-02-18 17:22:47  Leucovorin injection What is this medicine? LEUCOVORIN (loo koe VOR in) is used to prevent or treat the harmful effects of some medicines. This medicine is used  to treat anemia caused by a low amount of folic acid in the body. It is also used with 5-fluorouracil (5-FU) to treat colon cancer. This medicine may be used for other purposes; ask your health care provider or pharmacist if you have questions. What should I tell my health care provider before I take this medicine? They need to know if you have any of these conditions: -anemia from low  levels of vitamin B-12 in the blood -an unusual or allergic reaction to leucovorin, folic acid, other medicines, foods, dyes, or preservatives -pregnant or trying to get pregnant -breast-feeding How should I use this medicine? This medicine is for injection into a muscle or into a vein. It is given by a health care professional in a hospital or clinic setting. Talk to your pediatrician regarding the use of this medicine in children. Special care may be needed. Overdosage: If you think you have taken too much of this medicine contact a poison control center or emergency room at once. NOTE: This medicine is only for you. Do not share this medicine with others. What if I miss a dose? This does not apply. What may interact with this medicine? -capecitabine -fluorouracil -phenobarbital -phenytoin -primidone -trimethoprim-sulfamethoxazole This list may not describe all possible interactions. Give your health care provider a list of all the medicines, herbs, non-prescription drugs, or dietary supplements you use. Also tell them if you smoke, drink alcohol, or use illegal drugs. Some items may interact with your medicine. What should I watch for while using this medicine? Your condition will be monitored carefully while you are receiving this medicine. This medicine may increase the side effects of 5-fluorouracil, 5-FU. Tell your doctor or health care professional if you have diarrhea or mouth sores that do not get better or that get worse. What side effects may I notice from receiving this medicine? Side effects that you should report to your doctor or health care professional as soon as possible: -allergic reactions like skin rash, itching or hives, swelling of the face, lips, or tongue -breathing problems -fever, infection -mouth sores -unusual bleeding or bruising -unusually weak or tired Side effects that usually do not require medical attention (report to your doctor or health care  professional if they continue or are bothersome): -constipation or diarrhea -loss of appetite -nausea, vomiting This list may not describe all possible side effects. Call your doctor for medical advice about side effects. You may report side effects to FDA at 1-800-FDA-1088. Where should I keep my medicine? This drug is given in a hospital or clinic and will not be stored at home. NOTE: This sheet is a summary. It may not cover all possible information. If you have questions about this medicine, talk to your doctor, pharmacist, or health care provider.  2019 Elsevier/Gold Standard (2008-01-28 16:50:29)  Irinotecan injection What is this medicine? IRINOTECAN (ir in oh TEE kan ) is a chemotherapy drug. It is used to treat colon and rectal cancer. This medicine may be used for other purposes; ask your health care provider or pharmacist if you have questions. COMMON BRAND NAME(S): Camptosar What should I tell my health care provider before I take this medicine? They need to know if you have any of these conditions: -dehydration -diarrhea -infection (especially a virus infection such as chickenpox, cold sores, or herpes) -liver disease -low blood counts, like low white cell, platelet, or red cell counts -low levels of calcium, magnesium, or potassium in the blood -recent or ongoing radiation therapy -an unusual or  allergic reaction to irinotecan, other medicines, foods, dyes, or preservatives -pregnant or trying to get pregnant -breast-feeding How should I use this medicine? This drug is given as an infusion into a vein. It is administered in a hospital or clinic by a specially trained health care professional. Talk to your pediatrician regarding the use of this medicine in children. Special care may be needed. Overdosage: If you think you have taken too much of this medicine contact a poison control center or emergency room at once. NOTE: This medicine is only for you. Do not share this  medicine with others. What if I miss a dose? It is important not to miss your dose. Call your doctor or health care professional if you are unable to keep an appointment. What may interact with this medicine? This medicine may interact with the following medications: -antiviral medicines for HIV or AIDS -certain antibiotics like rifampin or rifabutin -certain medicines for fungal infections like itraconazole, ketoconazole, posaconazole, and voriconazole -certain medicines for seizures like carbamazepine, phenobarbital, phenotoin -clarithromycin -gemfibrozil -nefazodone -St. John's Wort This list may not describe all possible interactions. Give your health care provider a list of all the medicines, herbs, non-prescription drugs, or dietary supplements you use. Also tell them if you smoke, drink alcohol, or use illegal drugs. Some items may interact with your medicine. What should I watch for while using this medicine? Your condition will be monitored carefully while you are receiving this medicine. You will need important blood work done while you are taking this medicine. This drug may make you feel generally unwell. This is not uncommon, as chemotherapy can affect healthy cells as well as cancer cells. Report any side effects. Continue your course of treatment even though you feel ill unless your doctor tells you to stop. In some cases, you may be given additional medicines to help with side effects. Follow all directions for their use. You may get drowsy or dizzy. Do not drive, use machinery, or do anything that needs mental alertness until you know how this medicine affects you. Do not stand or sit up quickly, especially if you are an older patient. This reduces the risk of dizzy or fainting spells. Call your doctor or health care professional for advice if you get a fever, chills or sore throat, or other symptoms of a cold or flu. Do not treat yourself. This drug decreases your body's ability  to fight infections. Try to avoid being around people who are sick. This medicine may increase your risk to bruise or bleed. Call your doctor or health care professional if you notice any unusual bleeding. Be careful brushing and flossing your teeth or using a toothpick because you may get an infection or bleed more easily. If you have any dental work done, tell your dentist you are receiving this medicine. Avoid taking products that contain aspirin, acetaminophen, ibuprofen, naproxen, or ketoprofen unless instructed by your doctor. These medicines may hide a fever. Do not become pregnant while taking this medicine. Women should inform their doctor if they wish to become pregnant or think they might be pregnant. There is a potential for serious side effects to an unborn child. Talk to your health care professional or pharmacist for more information. Do not breast-feed an infant while taking this medicine. What side effects may I notice from receiving this medicine? Side effects that you should report to your doctor or health care professional as soon as possible: -allergic reactions like skin rash, itching or hives, swelling of the  face, lips, or tongue -chest pain -diarrhea -flushing, runny nose, sweating during infusion -low blood counts - this medicine may decrease the number of white blood cells, red blood cells and platelets. You may be at increased risk for infections and bleeding. -nausea, vomiting -pain, swelling, warmth in the leg -signs of decreased platelets or bleeding - bruising, pinpoint red spots on the skin, black, tarry stools, blood in the urine -signs of infection - fever or chills, cough, sore throat, pain or difficulty passing urine -signs of decreased red blood cells - unusually weak or tired, fainting spells, lightheadedness Side effects that usually do not require medical attention (report to your doctor or health care professional if they continue or are  bothersome): -constipation -hair loss -headache -loss of appetite -mouth sores -stomach pain This list may not describe all possible side effects. Call your doctor for medical advice about side effects. You may report side effects to FDA at 1-800-FDA-1088. Where should I keep my medicine? This drug is given in a hospital or clinic and will not be stored at home. NOTE: This sheet is a summary. It may not cover all possible information. If you have questions about this medicine, talk to your doctor, pharmacist, or health care provider.  2019 Elsevier/Gold Standard (2017-10-09 15:02:58)  Fluorouracil, 5-FU injection What is this medicine? FLUOROURACIL, 5-FU (flure oh YOOR a sil) is a chemotherapy drug. It slows the growth of cancer cells. This medicine is used to treat many types of cancer like breast cancer, colon or rectal cancer, pancreatic cancer, and stomach cancer. This medicine may be used for other purposes; ask your health care provider or pharmacist if you have questions. COMMON BRAND NAME(S): Adrucil What should I tell my health care provider before I take this medicine? They need to know if you have any of these conditions: -blood disorders -dihydropyrimidine dehydrogenase (DPD) deficiency -infection (especially a virus infection such as chickenpox, cold sores, or herpes) -kidney disease -liver disease -malnourished, poor nutrition -recent or ongoing radiation therapy -an unusual or allergic reaction to fluorouracil, other chemotherapy, other medicines, foods, dyes, or preservatives -pregnant or trying to get pregnant -breast-feeding How should I use this medicine? This drug is given as an infusion or injection into a vein. It is administered in a hospital or clinic by a specially trained health care professional. Talk to your pediatrician regarding the use of this medicine in children. Special care may be needed. Overdosage: If you think you have taken too much of this  medicine contact a poison control center or emergency room at once. NOTE: This medicine is only for you. Do not share this medicine with others. What if I miss a dose? It is important not to miss your dose. Call your doctor or health care professional if you are unable to keep an appointment. What may interact with this medicine? -allopurinol -cimetidine -dapsone -digoxin -hydroxyurea -leucovorin -levamisole -medicines for seizures like ethotoin, fosphenytoin, phenytoin -medicines to increase blood counts like filgrastim, pegfilgrastim, sargramostim -medicines that treat or prevent blood clots like warfarin, enoxaparin, and dalteparin -methotrexate -metronidazole -pyrimethamine -some other chemotherapy drugs like busulfan, cisplatin, estramustine, vinblastine -trimethoprim -trimetrexate -vaccines Talk to your doctor or health care professional before taking any of these medicines: -acetaminophen -aspirin -ibuprofen -ketoprofen -naproxen This list may not describe all possible interactions. Give your health care provider a list of all the medicines, herbs, non-prescription drugs, or dietary supplements you use. Also tell them if you smoke, drink alcohol, or use illegal drugs. Some items may interact  with your medicine. What should I watch for while using this medicine? Visit your doctor for checks on your progress. This drug may make you feel generally unwell. This is not uncommon, as chemotherapy can affect healthy cells as well as cancer cells. Report any side effects. Continue your course of treatment even though you feel ill unless your doctor tells you to stop. In some cases, you may be given additional medicines to help with side effects. Follow all directions for their use. Call your doctor or health care professional for advice if you get a fever, chills or sore throat, or other symptoms of a cold or flu. Do not treat yourself. This drug decreases your body's ability to fight  infections. Try to avoid being around people who are sick. This medicine may increase your risk to bruise or bleed. Call your doctor or health care professional if you notice any unusual bleeding. Be careful brushing and flossing your teeth or using a toothpick because you may get an infection or bleed more easily. If you have any dental work done, tell your dentist you are receiving this medicine. Avoid taking products that contain aspirin, acetaminophen, ibuprofen, naproxen, or ketoprofen unless instructed by your doctor. These medicines may hide a fever. Do not become pregnant while taking this medicine. Women should inform their doctor if they wish to become pregnant or think they might be pregnant. There is a potential for serious side effects to an unborn child. Talk to your health care professional or pharmacist for more information. Do not breast-feed an infant while taking this medicine. Men should inform their doctor if they wish to father a child. This medicine may lower sperm counts. Do not treat diarrhea with over the counter products. Contact your doctor if you have diarrhea that lasts more than 2 days or if it is severe and watery. This medicine can make you more sensitive to the sun. Keep out of the sun. If you cannot avoid being in the sun, wear protective clothing and use sunscreen. Do not use sun lamps or tanning beds/booths. What side effects may I notice from receiving this medicine? Side effects that you should report to your doctor or health care professional as soon as possible: -allergic reactions like skin rash, itching or hives, swelling of the face, lips, or tongue -low blood counts - this medicine may decrease the number of white blood cells, red blood cells and platelets. You may be at increased risk for infections and bleeding. -signs of infection - fever or chills, cough, sore throat, pain or difficulty passing urine -signs of decreased platelets or bleeding - bruising,  pinpoint red spots on the skin, black, tarry stools, blood in the urine -signs of decreased red blood cells - unusually weak or tired, fainting spells, lightheadedness -breathing problems -changes in vision -chest pain -mouth sores -nausea and vomiting -pain, swelling, redness at site where injected -pain, tingling, numbness in the hands or feet -redness, swelling, or sores on hands or feet -stomach pain -unusual bleeding Side effects that usually do not require medical attention (report to your doctor or health care professional if they continue or are bothersome): -changes in finger or toe nails -diarrhea -dry or itchy skin -hair loss -headache -loss of appetite -sensitivity of eyes to the light -stomach upset -unusually teary eyes This list may not describe all possible side effects. Call your doctor for medical advice about side effects. You may report side effects to FDA at 1-800-FDA-1088. Where should I keep my medicine?  This drug is given in a hospital or clinic and will not be stored at home. NOTE: This sheet is a summary. It may not cover all possible information. If you have questions about this medicine, talk to your doctor, pharmacist, or health care provider.  2019 Elsevier/Gold Standard (2007-11-27 13:53:16)

## 2019-01-09 ENCOUNTER — Inpatient Hospital Stay: Payer: BC Managed Care – PPO | Admitting: Nutrition

## 2019-01-09 ENCOUNTER — Telehealth: Payer: Self-pay | Admitting: Hematology

## 2019-01-09 NOTE — Progress Notes (Signed)
RD working remotely.  Left message with name and phone number for return call.

## 2019-01-09 NOTE — Telephone Encounter (Signed)
Scheduled appt per 6/3 los. °

## 2019-01-09 NOTE — Telephone Encounter (Signed)
A genetic counseling appt has been scheduled w/Brianna Cowan on 6/15 at 1pm. Letter mailed.

## 2019-01-10 ENCOUNTER — Inpatient Hospital Stay: Payer: BC Managed Care – PPO

## 2019-01-10 ENCOUNTER — Other Ambulatory Visit: Payer: Self-pay

## 2019-01-10 VITALS — BP 133/89 | HR 83 | Temp 98.5°F | Resp 18

## 2019-01-10 DIAGNOSIS — Z7189 Other specified counseling: Secondary | ICD-10-CM

## 2019-01-10 DIAGNOSIS — C250 Malignant neoplasm of head of pancreas: Secondary | ICD-10-CM

## 2019-01-10 DIAGNOSIS — C252 Malignant neoplasm of tail of pancreas: Secondary | ICD-10-CM

## 2019-01-10 DIAGNOSIS — Z95828 Presence of other vascular implants and grafts: Secondary | ICD-10-CM

## 2019-01-10 LAB — CANCER ANTIGEN 19-9: CA 19-9: 7582 U/mL — ABNORMAL HIGH (ref 0–35)

## 2019-01-10 MED ORDER — SODIUM CHLORIDE 0.9% FLUSH
10.0000 mL | Freq: Once | INTRAVENOUS | Status: AC
  Administered 2019-01-10: 14:00:00 10 mL
  Filled 2019-01-10: qty 10

## 2019-01-10 MED ORDER — PEGFILGRASTIM-CBQV 6 MG/0.6ML ~~LOC~~ SOSY
6.0000 mg | PREFILLED_SYRINGE | Freq: Once | SUBCUTANEOUS | Status: AC
  Administered 2019-01-10: 6 mg via SUBCUTANEOUS

## 2019-01-10 MED ORDER — PEGFILGRASTIM-CBQV 6 MG/0.6ML ~~LOC~~ SOSY
PREFILLED_SYRINGE | SUBCUTANEOUS | Status: AC
  Filled 2019-01-10: qty 0.6

## 2019-01-10 MED ORDER — HEPARIN SOD (PORK) LOCK FLUSH 100 UNIT/ML IV SOLN
500.0000 [IU] | Freq: Once | INTRAVENOUS | Status: AC
  Administered 2019-01-10: 14:00:00 500 [IU]
  Filled 2019-01-10: qty 5

## 2019-01-15 ENCOUNTER — Encounter: Payer: Self-pay | Admitting: Licensed Clinical Social Worker

## 2019-01-17 ENCOUNTER — Telehealth: Payer: Self-pay | Admitting: Licensed Clinical Social Worker

## 2019-01-17 NOTE — Progress Notes (Signed)
[place]   Telephone:([phone]) [phone] Fax:(336) [phone]   Clinic Follow up Note   Patient Care Team: Leonard Downing, MD as PCP - General (Family Medicine)  Date of Service:  01/22/2019  CHIEF COMPLAINT: F/u of Pancreatic Cancer  SUMMARY OF ONCOLOGIC HISTORY: Oncology History Overview Note  Cancer Staging Pancreatic cancer [facility]) Staging form: Exocrine Pancreas, AJCC 8th Edition - Clinical stage from 10/29/2018: Stage IV (cT3, cN0, cM1) - Signed by Truitt Merle, MD on 11/05/2018     Pancreatic cancer ([place])  10/09/2018 Procedure   Upper Endoscopy by. Dr Paulita Fujita 10/29/18 IMPRESSION - There was no evidence of significant pathology in the left lobe of the liver. - There was no sign of significant pathology in the gallbladder. - Many abnormal lymph nodes were visualized in the splenic region (level 19), perigastric region and peripancreatic region. - A mass was identified in the pancreatic tail. This was staged T4 N2 Mx by endosonographic criteria. Fine needle aspiration performed.   10/10/2018 Imaging   CT Abdomen 10/10/18 IMPRESSION: 1. Uncomplicated acute diverticulitis of the proximal sigmoid colon. 2. Ill-defined low-density in the pancreatic tail measuring at least 4.7 cm, with possible punctate peripheral calcifications. This is suspicious for pancreatic neoplasm, however sequela of prior pancreatitis is also considered. Recommend further characterization with pancreatic protocol MRI when patient is able to tolerate breath hold technique. 3. 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 4. 16 mm cyst in the left lobe of the liver. Aortic Atherosclerosis (ICD10-I70.0).   10/16/2018 Imaging   MRI Abdomen at [facility] 10/16/18 IMPRESSION: 1. Mass in the tail the pancreas concerning for neoplasm. This contacts the spleen in the splenic hilum. 2. There is stranding in the fat extending from the tumor to  the celiac axis and left adrenal gland. Cannot exclude infiltrating tumor. 3. 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 4. metastatic lesion in the liver   10/29/2018 Initial Biopsy   Diagnosis 10/29/18 FINE NEEDLE ASPIRATION, ENDOSCOPIC, PANCREAS TAIL(SPECIMEN 1 OF 1 COLLECTED 10/29/18): MALIGNANT CELLS PRESENT, CONSISTENT WITH ADENOCARCINOMA. SEE COMMENT. COMMENT: THERE IS INSUFFICIENT TUMOR PRESENT FOR ADDITIONAL STUDIES.   10/29/2018 Cancer Staging   Staging form: Exocrine Pancreas, AJCC 8th Edition - Clinical stage from 10/29/2018: Stage IV (cT3, cN0, cM1) - Signed by Truitt Merle, MD on 11/05/2018   11/04/2018 Initial Diagnosis   Pancreatic cancer ([place])   11/27/2018 Imaging   CT Chest  IMPRESSION: 1.  No acute process or evidence of metastatic disease in the chest. 2. Locally advanced pancreatic carcinoma, as detailed previously. Cannot exclude superimposed pancreatitis. 3. New and enlarging liver lesions, suspicious for hepatic metastasis. 4. Aortic atherosclerosis (ICD10-I70.0), coronary artery atherosclerosis and emphysema (ICD10-J43.9).   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  The Common Hereditary Gene Panel offered by [facility] includes sequencing and/or deletion duplication testing of the following 48 genes: APC, ATM, AXIN2, BARD1, BMPR1A, BRCA1, BRCA2, BRIP1, CDH1, CDK4, CDKN2A (p14ARF), CDKN2A (p16INK4a), CHEK2, CTNNA1, DICER1, EPCAM (Deletion/duplication testing only), GREM1 (promoter region deletion/duplication testing only), KIT, MEN1, MLH1, MSH2, MSH3, MSH6, MUTYH, NBN, NF1, NHTL1, PALB2, PDGFRA, PMS2, POLD1, POLE, PTEN, RAD50, RAD51C, RAD51D, RNF43, SDHB, SDHC, SDHD, SMAD4, SMARCA4. STK11, TP53, TSC1, TSC2, and VHL.  The following genes were evaluated for sequence  changes only: SDHA and HOXB13 c.251G>A variant only.  The report date is December 04, 2018.,      CURRENT THERAPY:  First lineFOLFIRINOX q2weeks starting4/23/20  INTERVAL HISTORY:  Vincent [name] is here for a follow up and treatment. He presents to the clinic alone. He notes after last pump d/c he had syncope episode at home upon standing. He woke up seconds later. He felt well the rest of the day. This happened again a few days later but his wife caught him. He notes this latest week 2 he recovered very well and able to eat and drink adequately. He notes he only take lisinopril as needed. He notes last cycle infusion did not include his usual service. He was here for 8 hours getting infusion which does not usually occur.  He plans to go on vacation in early July.     REVIEW OF SYSTEMS:   Constitutional: Denies fevers, chills or abnormal weight loss Eyes: Denies blurriness of vision Ears, nose, mouth, throat, and face: Denies mucositis or sore throat Respiratory: Denies cough, dyspnea or wheezes Cardiovascular: Denies palpitation, chest discomfort or lower extremity swelling Gastrointestinal:  Denies nausea, heartburn or change in bowel habits Skin: Denies abnormal skin rashes Lymphatics: Denies new lymphadenopathy or easy bruising Neurological:Denies numbness, tingling or new weaknesses Behavioral/Psych: Mood is stable, no new changes  All other systems were reviewed with the patient and are negative.  MEDICAL HISTORY:  Past Medical History:  Diagnosis Date   Diabetes mellitus without complication ([place])    pt. states that he is not diabeticalthought he takes metformin   Eye hemorrhage    right   Family history of lung cancer    Hypertension    Monoallelic mutation of BRIP1 gene    Monoallelic mutation of MUTYH gene     SURGICAL HISTORY: Past Surgical History:  Procedure Laterality Date   ESOPHAGOGASTRODUODENOSCOPY (EGD) WITH PROPOFOL N/A 10/29/2018   Procedure:  ESOPHAGOGASTRODUODENOSCOPY (EGD) WITH PROPOFOL;  Surgeon: Arta Silence, MD;  Location: [facility];  Service: Endoscopy;  Laterality: N/A;   EUS N/A 10/29/2018   Procedure: FULL UPPER ENDOSCOPIC ULTRASOUND (EUS) RADIAL;  Surgeon: Arta Silence, MD;  Location: [facility];  Service: Endoscopy;  Laterality: N/A;   FINE NEEDLE ASPIRATION N/A 10/29/2018   Procedure: FINE NEEDLE ASPIRATION (FNA) LINEAR;  Surgeon: Arta Silence, MD;  Location: [facility];  Service: Endoscopy;  Laterality: N/A;   IR IMAGING GUIDED PORT INSERTION  11/11/2018   LUMBAR LAMINECTOMY/DECOMPRESSION MICRODISCECTOMY Left 10/11/2017   Procedure: Microlumbar decompression Lumbar Four-Five Left;  Surgeon: Susa Day, MD;  Location: [facility];  Service: Orthopedics;  Laterality: Left;  Microlumbar decompression Lumbar Four-Five Left   removal of wisdom teeth  1977    I have reviewed the social history and family history with the patient and they are unchanged from previous note.  ALLERGIES:  has No Known Allergies.  MEDICATIONS:  Current Outpatient Medications  Medication Sig Dispense Refill   Accu-Chek FastClix Lancets MISC daily. for testing     aspirin EC 81 MG tablet Take 1 tablet (81 mg total) by mouth daily after breakfast. Resume 4 days post-op     Ginger, Zingiber officinalis, (GINGER ROOT PO) Take 750 mg by mouth.     HYDROcodone-acetaminophen (NORCO) 5-325 MG tablet Take 1 tablet by mouth every 6 (six) hours as needed for moderate pain. 30 tablet 0   lidocaine-prilocaine (EMLA) cream Apply to affected area once 30 g 3   lisinopril (PRINIVIL,ZESTRIL) 10 MG tablet Take 10 mg by mouth daily after breakfast.  metFORMIN (GLUCOPHAGE) 1000 MG tablet TAKE 1 TABLET BY MOUTH AT BREAKFAST AND 1/2 TABLET AT SUPPER     ondansetron (ZOFRAN) 8 MG tablet Take 1 tablet (8 mg total) by mouth 2 (two) times daily as needed for refractory nausea / vomiting. Start on day 3 after chemotherapy. 30 tablet 1   oxyCODONE  (OXY IR/ROXICODONE) 5 MG immediate release tablet Take 1-2 tablets (5-10 mg total) by mouth every 6 (six) hours as needed for severe pain. 45 tablet 0   pantoprazole (PROTONIX) 40 MG tablet Take 1 tablet (40 mg total) by mouth daily. 30 tablet 0   potassium chloride SA (K-DUR) 20 MEQ tablet Take 1 tablet (20 mEq total) by mouth every other day. 30 tablet 0   prochlorperazine (COMPAZINE) 10 MG tablet TAKE 1 TABLET (10 MG TOTAL) BY MOUTH EVERY 6 (SIX) HOURS AS NEEDED (NAUSEA). 30 tablet 1   simvastatin (ZOCOR) 40 MG tablet Take 20 mg by mouth every evening.     traMADol (ULTRAM) 50 MG tablet Take 2 tablets (100 mg total) by mouth every 6 (six) hours as needed. 120 tablet 1   No current facility-administered medications for this visit.     PHYSICAL EXAMINATION: ECOG PERFORMANCE STATUS: 1 - Symptomatic but completely ambulatory  Vitals:   01/22/19 0936  BP: (!) 133/98  Pulse: 90  Resp: 18  Temp: 98.3 F (36.8 C)  SpO2: 98%   Filed Weights   01/22/19 0936  Weight: 216 lb 8 oz (98.2 kg)    GENERAL:alert, no distress and comfortable SKIN: skin color, texture, turgor are normal, no rashes or significant lesions EYES: normal, Conjunctiva are pink and non-injected, sclera clear  NECK: supple, thyroid normal size, non-tender, without nodularity LYMPH:  no palpable lymphadenopathy in the cervical, axillary  LUNGS: clear to auscultation and percussion with normal breathing effort HEART: regular rate & rhythm and no murmurs and no lower extremity edema ABDOMEN:abdomen soft, non-tender and normal bowel sounds Musculoskeletal:no cyanosis of digits and no clubbing  NEURO: alert & oriented x 3 with fluent speech, no focal motor/sensory deficits  LABORATORY DATA:  I have reviewed the data as listed CBC Latest Ref Rng & Units 01/22/2019 01/08/2019 12/26/2018  WBC 4.0 - 10.5 K/uL 6.5 8.8 7.3  Hemoglobin 13.0 - 17.0 g/dL 11.5(L) 11.6(L) 12.7(L)  Hematocrit 39.0 - 52.0 % 34.8(L) 35.6(L) 38.2(L)    Platelets 150 - 400 K/uL 145(L) 141(L) 217     CMP Latest Ref Rng & Units 01/22/2019 01/08/2019 12/26/2018  Glucose 70 - 99 mg/dL 140(H) 178(H) 150(H)  BUN 8 - 23 mg/dL '12 14 14  ' Creatinine 0.61 - 1.24 mg/dL 0.79 0.81 0.90  Sodium 135 - 145 mmol/L 143 141 140  Potassium 3.5 - 5.1 mmol/L 3.4(L) 3.7 3.4(L)  Chloride 98 - 111 mmol/L 107 108 105  CO2 22 - 32 mmol/L '25 23 25  ' Calcium 8.9 - 10.3 mg/dL 8.5(L) 8.7(L) 8.9  Total Protein 6.5 - 8.1 g/dL 6.3(L) 6.4(L) 6.4(L)  Total Bilirubin 0.3 - 1.2 mg/dL 0.4 0.4 0.3  Alkaline Phos 38 - 126 U/L 113 121 94  AST 15 - 41 U/L '19 19 22  ' ALT 0 - 44 U/L 32 41 50(H)      RADIOGRAPHIC STUDIES: I have personally reviewed the radiological images as listed and agreed with the findings in the report. No results found.   ASSESSMENT & PLAN:  Vincent [name] is a 68 y.o. male with   1. Adenocarcinoma oftail of Pancreas, cT3N0Mx, with liver metastasis -  He was recently diagnosedin3/2020.  -His CT APand abdominal MRI showed a 1.6cm lesion in thedorm, also it appears likely a benign cyst on the CT scan, however the MRI features are concerning for livermetastasis. -His baseline CA 19-9 was significantly elevated at 3,683 (11/05/18). This is alsoconcerningfor metastatic disease. -I discussed his CT Chest from 11/27/18 shows no acute process or evidence of metastatic disease in chest. It also showed growth of known liver lesions(2.9cm)along with a new 1.6cmliver lesionin inferior right lobe,which are highly suspicious for metastasis.  -I discussed to confirm with definitive prognosis we can do liver biopsy. Given thetypical scan findings and growth over time, the liver lesions are likely metastatic lesions. Given the current COVID-19,I will probablyskip biopsy. Heagreed. -I discussed if metastatic disease, he is no longer a surgical candidate and his cancer is no longer curable. Goal of therapy would be palliative toprolong his life and  improve his quality of life. He understands. -He started first-line FOLFIRINOX every 2 weeks on 11/28/18. He I tolerating moderately welloverall with fatigue and fluctuating appetite and cold sensitivity. Will watch for neuropathy.  -He has had 2 episodes of dizziness and syncope upon standing with cycle 4. I will add IV fluids to pump d/c and advised him to not take lisinopril week 1 of treatment. He has been able to recover very well week 2.  -Labs reviewed, CBC and CMP WNL except Hg 11.5, MCV 103.9, PLT 145K, K 3.4, BG 140, Ca 8.5, Protein 6.3, albumin 3.4. Overall adequate to proceed with FOLFIRINOX today  -F/u in 3 weeks with CT abdomen and pelvis with contrast  2. Epigastricand LUQpainradiating to his back -secondary topancreaticcancer -Ipreviouslyprescribed him NORCObut does not feel it helps as much as Tramadol. I advised him not to go over 8 tabs Tramadol in a day. -Abdominal painhas revolvedsince he started chemo, back pain manageable with tramadol and tylenol.He is not taking norco or oxycodone. Stable.   3. Lower appetite, Weight loss  -Eating exacerbates his epigastric pain and nausea -He has lost 40 pounds when he was diagnosed  -Appetite fluctuating with chemo. He is able to recover and weight stabilized with week 2. He drinks protein shakes like Glucerna, supplements, and flavored water.  -Continue tofollow up with Dietician -stable   4. DM, HTN  -On Metformin and Lisinopril -We will monitor his blood pressure and blood glucose closely during the chemotherapy. May adjustmedication during chemoif needed -Continue to f/u with PCP -Due to hypotension he takes lisinopril as needed. Given recent 2 episodes of dizziness upon standing and syncope, I will add IV Fluids with pump D/c and he will not take lisinopril with week 2.  -BP 133/98 today (01/22/19).   5. Hypokalemia  -will continue to increase potassium in his diet.  -K at 3.4 today (01/22/19). He will  increase oral K to TID for 3 days then once dialy.    6. 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  -Will discuss results with Santiago Glad    PLAN: -labs reviewed and adequate to proceed with C70mOLFIRINOX todaywith Udenyca on day 3 -Add IV Fluids with pump D/c  -continue to hold lisinopril  -lab, flush,f/uand chemo in 3 weekswith CT AP a few days before    No problem-specific Assessment & Plan notes found for this encounter.   Orders Placed This Encounter  Procedures   CT Abdomen Pelvis W Contrast    Standing Status:  Future    Standing Expiration Date:   01/22/2020    Order Specific Question:   If indicated for the ordered procedure, I authorize the administration of contrast media per Radiology protocol    Answer:   Yes    Order Specific Question:   Preferred imaging location?    Answer:   [facility]    Order Specific Question:   Is Oral Contrast requested for this exam?    Answer:   Yes, Per Radiology protocol    Order Specific Question:   Radiology Contrast Protocol - do NOT remove file path    Answer:   [email or web address]   All questions were answered. The patient knows to call the clinic with any problems, questions or concerns. No barriers to learning was detected. I spent 20 minutes counseling the patient face to face. The total time spent in the appointment was 25 minutes and more than 50% was on counseling and review of test results     Truitt Merle, MD 01/22/2019   I, Joslyn Devon, am acting as scribe for Truitt Merle, MD.   I have reviewed the above documentation for accuracy and completeness, and I agree with the above.

## 2019-01-17 NOTE — Telephone Encounter (Signed)
Called no answer left message on voice mail. done

## 2019-01-17 NOTE — Telephone Encounter (Signed)
Called patient regarding upcoming [facility] appointment, test run complete with patient's wife and e-mail has been sent.

## 2019-01-20 ENCOUNTER — Inpatient Hospital Stay: Payer: BC Managed Care – PPO | Admitting: Licensed Clinical Social Worker

## 2019-01-20 ENCOUNTER — Encounter: Payer: Self-pay | Admitting: Licensed Clinical Social Worker

## 2019-01-20 DIAGNOSIS — Z1379 Encounter for other screening for genetic and chromosomal anomalies: Secondary | ICD-10-CM

## 2019-01-20 DIAGNOSIS — Z1589 Genetic susceptibility to other disease: Secondary | ICD-10-CM | POA: Insufficient documentation

## 2019-01-20 DIAGNOSIS — Z801 Family history of malignant neoplasm of trachea, bronchus and lung: Secondary | ICD-10-CM | POA: Insufficient documentation

## 2019-01-20 DIAGNOSIS — Z1501 Genetic susceptibility to malignant neoplasm of breast: Secondary | ICD-10-CM

## 2019-01-20 NOTE — Progress Notes (Signed)
REFERRING PROVIDER: Truitt Merle, MD [address] [place],  [place] [address]  PRIMARY PROVIDER:  Leonard Downing, MD  PRIMARY REASON FOR VISIT:  1. Genetic testing   2. Family history of lung cancer   3. Monoallelic mutation of BRIP1 gene   4. Monoallelic mutation of MUTYH gene    I connected with Vincent [name] on 01/20/2019 at 1:15 PM EDT by Webex and verified that I am speaking with the correct person using two identifiers.    Patient location: home Provider location: clinic   HISTORY OF PRESENT ILLNESS:   Vincent [name], a 68 y.o. male, was seen for a [facility] [place] cancer genetics consultation at the request of Dr. Burr Medico due his recent genetic testing which revealed a BRIP1 pathogenic variant and a single pathogenic variant in the MUTYH gene.  Vincent [name] presents to clinic today to discuss these results, to further clarify his future cancer risks, as well as potential cancer risks for family members.   In 2020, at the age of 68, Vincent [name] was diagnosed with pancreatic cancer. The treatment plan includes chemotherapy and possibly surgery.   CANCER HISTORY:  Oncology History Overview Note  Cancer Staging Pancreatic cancer [facility]) Staging form: Exocrine Pancreas, AJCC 8th Edition - Clinical stage from 10/29/2018: Stage IV (cT3, cN0, cM1) - Signed by Truitt Merle, MD on 11/05/2018     Pancreatic cancer ([place])  10/09/2018 Procedure   Upper Endoscopy by. Dr Paulita Fujita 10/29/18 IMPRESSION - There was no evidence of significant pathology in the left lobe of the liver. - There was no sign of significant pathology in the gallbladder. - Many abnormal lymph nodes were visualized in the splenic region (level 19), perigastric region and peripancreatic region. - A mass was identified in the pancreatic tail. This was staged T4 N2 Mx by endosonographic criteria. Fine needle aspiration performed.   10/10/2018 Imaging   CT Abdomen 10/10/18 IMPRESSION: 1. Uncomplicated acute diverticulitis of the proximal  sigmoid colon. 2. Ill-defined low-density in the pancreatic tail measuring at least 4.7 cm, with possible punctate peripheral calcifications. This is suspicious for pancreatic neoplasm, however sequela of prior pancreatitis is also considered. Recommend further characterization with pancreatic protocol MRI when patient is able to tolerate breath hold technique. 3. 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 4. 16 mm cyst in the left lobe of the liver. Aortic Atherosclerosis (ICD10-I70.0).   10/16/2018 Imaging   MRI Abdomen at [facility] 10/16/18 IMPRESSION: 1. Mass in the tail the pancreas concerning for neoplasm. This contacts the spleen in the splenic hilum. 2. There is stranding in the fat extending from the tumor to the celiac axis and left adrenal gland. Cannot exclude infiltrating tumor. 3. 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 4. metastatic lesion in the liver   10/29/2018 Initial Biopsy   Diagnosis 10/29/18 FINE NEEDLE ASPIRATION, ENDOSCOPIC, PANCREAS TAIL(SPECIMEN 1 OF 1 COLLECTED 10/29/18): MALIGNANT CELLS PRESENT, CONSISTENT WITH ADENOCARCINOMA. SEE COMMENT. COMMENT: THERE IS INSUFFICIENT TUMOR PRESENT FOR ADDITIONAL STUDIES.   10/29/2018 Cancer Staging   Staging form: Exocrine Pancreas, AJCC 8th Edition - Clinical stage from 10/29/2018: Stage IV (cT3, cN0, cM1) - Signed by Truitt Merle, MD on 11/05/2018   11/04/2018 Initial Diagnosis   Pancreatic cancer ([place])   11/27/2018 Imaging   CT Chest  IMPRESSION: 1.  No acute process or evidence of metastatic disease in the chest. 2. Locally advanced pancreatic carcinoma, as detailed previously. Cannot exclude superimposed pancreatitis. 3. New and enlarging liver  lesions, suspicious for hepatic metastasis. 4. Aortic atherosclerosis (ICD10-I70.0), coronary artery atherosclerosis and emphysema (ICD10-J43.9).   11/28/2018 -  Chemotherapy   First-Line FOLFIRINOX  q2weeks starting 11/28/18   12/04/2018 Genetic Testing   BRIP1 c.2392C>T Pathogenic variant and MUTYH c.1187G>A single pathogenic variant, both identified on the common hereditary cancer panel.  The Common Hereditary Gene Panel offered by [facility] includes sequencing and/or deletion duplication testing of the following 48 genes: APC, ATM, AXIN2, BARD1, BMPR1A, BRCA1, BRCA2, BRIP1, CDH1, CDK4, CDKN2A (p14ARF), CDKN2A (p16INK4a), CHEK2, CTNNA1, DICER1, EPCAM (Deletion/duplication testing only), GREM1 (promoter region deletion/duplication testing only), KIT, MEN1, MLH1, MSH2, MSH3, MSH6, MUTYH, NBN, NF1, NHTL1, PALB2, PDGFRA, PMS2, POLD1, POLE, PTEN, RAD50, RAD51C, RAD51D, RNF43, SDHB, SDHC, SDHD, SMAD4, SMARCA4. STK11, TP53, TSC1, TSC2, and VHL.  The following genes were evaluated for sequence changes only: SDHA and HOXB13 c.251G>A variant only. The report date is December 04, 2018.,    Past Medical History:  Diagnosis Date  . Diabetes mellitus without complication ([place])    pt. states that he is not diabeticalthought he takes metformin  . Eye hemorrhage    right  . Family history of lung cancer   . Hypertension   . Monoallelic mutation of BRIP1 gene   . Monoallelic mutation of MUTYH gene     Past Surgical History:  Procedure Laterality Date  . ESOPHAGOGASTRODUODENOSCOPY (EGD) WITH PROPOFOL N/A 10/29/2018   Procedure: ESOPHAGOGASTRODUODENOSCOPY (EGD) WITH PROPOFOL;  Surgeon: Arta Silence, MD;  Location: [facility];  Service: Endoscopy;  Laterality: N/A;  . EUS N/A 10/29/2018   Procedure: FULL UPPER ENDOSCOPIC ULTRASOUND (EUS) RADIAL;  Surgeon: Arta Silence, MD;  Location: [facility];  Service: Endoscopy;  Laterality: N/A;  . FINE NEEDLE ASPIRATION N/A 10/29/2018   Procedure: FINE NEEDLE ASPIRATION (FNA) LINEAR;  Surgeon: Arta Silence, MD;  Location: [facility];  Service: Endoscopy;  Laterality: N/A;  . IR IMAGING GUIDED PORT INSERTION  11/11/2018  . LUMBAR LAMINECTOMY/DECOMPRESSION  MICRODISCECTOMY Left 10/11/2017   Procedure: Microlumbar decompression Lumbar Four-Five Left;  Surgeon: Susa Day, MD;  Location: [facility];  Service: Orthopedics;  Laterality: Left;  Microlumbar decompression Lumbar Four-Five Left  . removal of wisdom teeth  1977    Social History   Socioeconomic History  . Marital status: Married    Spouse name: Not on file  . Number of children: 4  . Years of education: Not on file  . Highest education level: Not on file  Occupational History  . Not on file  Social Needs  . Financial resource strain: Not on file  . Food insecurity    Worry: Not on file    Inability: Not on file  . Transportation needs    Medical: Not on file    Non-medical: Not on file  Tobacco Use  . Smoking status: Never Smoker  . Smokeless tobacco: Never Used  Substance and Sexual Activity  . Alcohol use: Yes    Comment: rarely  . Drug use: No  . Sexual activity: Not on file  Lifestyle  . Physical activity    Days per week: Not on file    Minutes per session: Not on file  . Stress: Not on file  Relationships  . Social Herbalist on phone: Not on file    Gets together: Not on file    Attends religious service: Not on file    Active member of club or organization: Not on file    Attends meetings of clubs or organizations: Not on file  Relationship status: Not on file  Other Topics Concern  . Not on file  Social History Narrative  . Not on file     FAMILY HISTORY:  We obtained a detailed, 4-generation family history.  Significant diagnoses are listed below: Family History  Problem Relation Age of Onset  . Cancer Mother 7       precancerous cells, thinks possibly ovarian    Mr. Debruyne has 2 sons, ages 37 and 32. He has 4 grandsons. Mr. Siegman had 1 brother, 2 sisters. No known cancers in these siblings or in nephews.  Mr. Hamza mother died at 40. He reports she had precancerous cells in her 53s that led to her having a hysterectomy. He believes  the cells may have been in her ovaries. Mr. Ottley had 3 maternal aunts, 3 maternal uncles, no known cancers. No known cancers in maternal cousins. His maternal grandmother died at 93, maternal grandfather died at 69.  Mr. Huffaker father died at 13, no history of cancer. Mr. Tsou  Had 4 paternal uncles, 3 paternal aunts. One of his uncles had lung cancer and died at 67. He had a history of smoking. An aunt currently has cancer, but he is unsure of the type. No known cancers in paternal cousins. His paternal grandfather died at 35, grandmother died at 37, no cancers.  Mr. Peregoy is unaware of previous family history of genetic testing for hereditary cancer risks. There is no reported Ashkenazi Jewish ancestry. There is no known consanguinity.  DISCUSSION: Mr. Finken has already had genetic testing through the Common Hereditary Cancers Panel. This testing revealed a pathogenic variant in the BRIP1 gene called c.2392C>T, and a single pathogenic variant in one of his MUTYH genes, called c.1187G>A.   The Common Hereditary Cancers Panel offered by [facility] includes sequencing and/or deletion duplication testing of the following 48 genes: APC, ATM, AXIN2, BARD1, BMPR1A, BRCA1, BRCA2, BRIP1, CDH1, CDKN2A (p14ARF), CDKN2A (p16INK4a), CKD4, CHEK2, CTNNA1, DICER1, EPCAM (Deletion/duplication testing only), GREM1 (promoter region deletion/duplication testing only), KIT, MEN1, MLH1, MSH2, MSH3, MSH6, MUTYH, NBN, NF1, NHTL1, PALB2, PDGFRA, PMS2, POLD1, POLE, PTEN, RAD50, RAD51C, RAD51D, RNF43, SDHB, SDHC, SDHD, SMAD4, SMARCA4. STK11, TP53, TSC1, TSC2, and VHL.  The following genes were evaluated for sequence changes only: SDHA and HOXB13 c.251G>A variant only.    We discussed that this result does not explain his pancreatic cancer. We discussed with Mr. Bergsma that because current genetic testing is not perfect, it is possible there may be a gene mutation in one of these genes that current testing cannot detect, but that  chance is small.  We also discussed, that there could be another gene that has not yet been discovered, or that we have not yet tested, that is responsible for his pancreatic cancer. It is also possible there is a hereditary cause for the cancer in the family that Mr. Penley did not inherit and therefore was not identified in his testing.  Therefore, it is important to remain in touch with cancer genetics in the future so that we can continue to offer Mr. Fines the most up to date genetic testing.   BRIP1  Clinical condition Women who are carriers of a single pathogenic BRIP1 variant have an increased risk of breast and ovarian cancer. The exact risk figures for breast cancer have yet to be determined; however, studies suggest the risk of ovarian cancer is approximately 8% and may be up to 15% (PMID: 58527782, 42353614, 43154008, 67619509). An individual with a BRIP1 pathogenic  variant will not necessarily develop cancer in their lifetime, but the risk for cancer is increased over the general population risk.   BRIP1 also has preliminary evidence of an association with hematologic malignancies. The meaning of preliminary-evidence genes is that association between the gene and the specific condition has not been completely established. This uncertainty may be resolved as new information becomes available, and therefore clinicians may continue to order these preliminary-evidence genes.  Inheritance Hereditary predisposition to cancer due to a single pathogenic variant in the BRIP1 gene has autosomal dominant inheritance. This means that an individual with a pathogenic variant has a 50% chance of passing the condition on to their offspring. Once such a variant is detected in an individual, it is possible to identify at-risk relatives who can pursue testing for this specific familial variant. Many cases are inherited from a parent, but some cases can occur spontaneously (i.e., an individual with a pathogenic  variant has parents who do not have it).   Additionally, individuals with a pathogenic variant in BRIP1 are carriers of Fanconi anemia type J. Fanconi anemia is an autosomal recessive disorder that is characterized by bone marrow failure and variable presentation of anomalies, including short stature, abnormal skin pigmentation, abnormal thumbs, malformations of the skeletal and central nervous systems, and developmental delay (PMID: 8119147, 82956213). Risks for leukemia and early onset solid tumors are significantly elevated (PMID: 08657846, 96295284, 13244010). For there to be a risk of Fanconi anemia in offspring, both parents would each have to have a single pathogenic variant in [place]; in such a case, the risk of having an affected child is 25%.  Management [employer] [place] (NCCN) recommends consideration of prophylactic salpingo-oophorectomy (surgical removal of the ovaries and fallopian tubes) for women with a pathogenic variant in BRIP1 at 45-50y.. Women electing to defer prophylactic oophorectomy can consider screening for serum CA-125 and transvaginal ultrasound; however, data do not support such screening and it should not be a substitute for preventive surgery. The current NCCN guidelines do not recommend additional breast cancer screening for individuals with a single pathogenic BRIP1 variant beyond what is recommended for the general population. However, they caution that cancer screening should ultimately be guided by personal and family history (NCCN v. 1.2020).  MUTYH  We reviewed recessive inheritance and discussed when an individual has two MUTYH gene mutations, this is associated with an increased risk for adenomatous (precancerous) colon polyps. Recently, the [employer]  (NCCN: V 1.2019)  provided national guidelines to manage the colon cancer risk found with single (heterozygous) MUTYH mutation.  These guidelines include having a  MUTYH pathogenic mutation and:  Personal history of colon cancer  Follow instructions provided by your physician based on your personal history.  Do not have a personal history of colon cancer but have a parent/sibling/child with colon cancer: Colonoscopy every 5 years starting at age 77 or 56 years younger than the earliest age of onset, whichever is younger.  Do not have a personal history of colon cancer and do not have a parent/sibling/child with colon cancer: Data is uncertain whether specialized screening is needed. Mr. Vannice did not report a family history of colon cancer, therefore he falls into this category.   However, to our current knowledge, individuals with only one MUTYH gene mutation do not have an increased risk for colon polyps.    FAMILY MEMBERS: It is important that all of Mr. Vasques relatives (both men and women) know of the presence of this gene mutation.  Site-specific genetic testing can sort out who in the family is at risk and who is not.   Mr. Sprung children and siblings have a 50% chance to have inherited this mutation. We recommend they have genetic testing for this same mutation, as identifying the presence of this mutation would allow them to also take advantage of risk-reducing measures.   PLAN:   1. These results have been made available to his referring provider, Dr. Burr Medico.   2. Mr. Schlafer plans to discuss these results with his family and will reach out to [place] if we can be of any assistance in coordinating genetic testing for any of his relatives.     FOLLOW-UP: Lastly, we discussed with Mr. Saraceno that cancer genetics is a rapidly advancing field and it is possible that new genetic tests will be appropriate for him and his family members in the future. We encouraged him to remain in contact with cancer genetics on an annual basis so we can update his personal and family histories and let him know of advances in cancer genetics that may benefit this family.    Our contact number was provided. Mr. Parkey questions were answered to his satisfaction, and he knows he is welcome to call us at anytime with additional questions or concerns.   Faith Rogue, MS Genetic Counselor [email or web address] Phone: [phone]  The patient was seen for a total of 40 minutes in virtual genetic counseling. He was accompanied by his wife. A [employer] intern, Loralyn Freshwater, was present and observed the session.  Drs. Magrinat, Lindi Adie and/or Burr Medico were available for discussion regarding this case.

## 2019-01-21 ENCOUNTER — Encounter: Payer: Self-pay | Admitting: Licensed Clinical Social Worker

## 2019-01-22 ENCOUNTER — Inpatient Hospital Stay: Payer: BC Managed Care – PPO

## 2019-01-22 ENCOUNTER — Inpatient Hospital Stay (HOSPITAL_BASED_OUTPATIENT_CLINIC_OR_DEPARTMENT_OTHER): Payer: BC Managed Care – PPO | Admitting: Hematology

## 2019-01-22 ENCOUNTER — Other Ambulatory Visit: Payer: Self-pay

## 2019-01-22 VITALS — BP 133/98 | HR 90 | Temp 98.3°F | Resp 18 | Ht 76.0 in | Wt 216.5 lb

## 2019-01-22 DIAGNOSIS — C250 Malignant neoplasm of head of pancreas: Secondary | ICD-10-CM

## 2019-01-22 DIAGNOSIS — R55 Syncope and collapse: Secondary | ICD-10-CM

## 2019-01-22 DIAGNOSIS — E119 Type 2 diabetes mellitus without complications: Secondary | ICD-10-CM

## 2019-01-22 DIAGNOSIS — C787 Secondary malignant neoplasm of liver and intrahepatic bile duct: Secondary | ICD-10-CM | POA: Diagnosis not present

## 2019-01-22 DIAGNOSIS — G893 Neoplasm related pain (acute) (chronic): Secondary | ICD-10-CM

## 2019-01-22 DIAGNOSIS — I10 Essential (primary) hypertension: Secondary | ICD-10-CM

## 2019-01-22 DIAGNOSIS — C252 Malignant neoplasm of tail of pancreas: Secondary | ICD-10-CM | POA: Diagnosis not present

## 2019-01-22 DIAGNOSIS — R42 Dizziness and giddiness: Secondary | ICD-10-CM

## 2019-01-22 DIAGNOSIS — Z7189 Other specified counseling: Secondary | ICD-10-CM

## 2019-01-22 DIAGNOSIS — F5089 Other specified eating disorder: Secondary | ICD-10-CM

## 2019-01-22 DIAGNOSIS — Z95828 Presence of other vascular implants and grafts: Secondary | ICD-10-CM

## 2019-01-22 DIAGNOSIS — Z79899 Other long term (current) drug therapy: Secondary | ICD-10-CM

## 2019-01-22 LAB — CMP (CANCER CENTER ONLY)
ALT: 32 U/L (ref 0–44)
AST: 19 U/L (ref 15–41)
Albumin: 3.4 g/dL — ABNORMAL LOW (ref 3.5–5.0)
Alkaline Phosphatase: 113 U/L (ref 38–126)
Anion gap: 11 (ref 5–15)
BUN: 12 mg/dL (ref 8–23)
CO2: 25 mmol/L (ref 22–32)
Calcium: 8.5 mg/dL — ABNORMAL LOW (ref 8.9–10.3)
Chloride: 107 mmol/L (ref 98–111)
Creatinine: 0.79 mg/dL (ref 0.61–1.24)
GFR, Est AFR Am: >60 mL/min (ref >60)
GFR, Estimated: >60 mL/min (ref >60)
Glucose, Bld: 140 mg/dL — ABNORMAL HIGH (ref 70–99)
Potassium: 3.4 mmol/L — ABNORMAL LOW (ref 3.5–5.1)
Sodium: 143 mmol/L (ref 135–145)
Total Bilirubin: 0.4 mg/dL (ref 0.3–1.2)
Total Protein: 6.3 g/dL — ABNORMAL LOW (ref 6.5–8.1)

## 2019-01-22 LAB — CBC WITH DIFFERENTIAL (CANCER CENTER ONLY)
Abs Immature Granulocytes: 0.03 10*3/uL (ref 0.00–0.07)
Basophils Absolute: 0.1 10*3/uL (ref 0.0–0.1)
Basophils Relative: 1 %
Eosinophils Absolute: 0.1 10*3/uL (ref 0.0–0.5)
Eosinophils Relative: 2 %
HCT: 34.8 % — ABNORMAL LOW (ref 39.0–52.0)
Hemoglobin: 11.5 g/dL — ABNORMAL LOW (ref 13.0–17.0)
Immature Granulocytes: 1 %
Lymphocytes Relative: 16 %
Lymphs Abs: 1 10*3/uL (ref 0.7–4.0)
MCH: 34.3 pg — ABNORMAL HIGH (ref 26.0–34.0)
MCHC: 33 g/dL (ref 30.0–36.0)
MCV: 103.9 fL — ABNORMAL HIGH (ref 80.0–100.0)
Monocytes Absolute: 0.7 10*3/uL (ref 0.1–1.0)
Monocytes Relative: 10 %
Neutro Abs: 4.6 10*3/uL (ref 1.7–7.7)
Neutrophils Relative %: 70 %
Platelet Count: 145 10*3/uL — ABNORMAL LOW (ref 150–400)
RBC: 3.35 MIL/uL — ABNORMAL LOW (ref 4.22–5.81)
RDW: 17.5 % — ABNORMAL HIGH (ref 11.5–15.5)
WBC Count: 6.5 10*3/uL (ref 4.0–10.5)
nRBC: 0 % (ref 0.0–0.2)

## 2019-01-22 MED ORDER — ATROPINE SULFATE 0.4 MG/ML IJ SOLN
INTRAMUSCULAR | Status: AC
  Filled 2019-01-22: qty 1

## 2019-01-22 MED ORDER — SODIUM CHLORIDE 0.9 % IV SOLN
Freq: Once | INTRAVENOUS | Status: AC
  Administered 2019-01-22: 11:00:00 via INTRAVENOUS
  Filled 2019-01-22: qty 5

## 2019-01-22 MED ORDER — SODIUM CHLORIDE 0.9% FLUSH
10.0000 mL | Freq: Once | INTRAVENOUS | Status: AC
  Administered 2019-01-22: 10 mL
  Filled 2019-01-22: qty 10

## 2019-01-22 MED ORDER — DEXTROSE 5 % IV SOLN
Freq: Once | INTRAVENOUS | Status: AC
  Administered 2019-01-22: 14:00:00 via INTRAVENOUS
  Filled 2019-01-22: qty 250

## 2019-01-22 MED ORDER — SODIUM CHLORIDE 0.9 % IV SOLN
2400.0000 mg/m2 | INTRAVENOUS | Status: DC
  Administered 2019-01-22: 5850 mg via INTRAVENOUS
  Filled 2019-01-22: qty 117

## 2019-01-22 MED ORDER — OXALIPLATIN CHEMO INJECTION 100 MG/20ML
82.0000 mg/m2 | Freq: Once | INTRAVENOUS | Status: AC
  Administered 2019-01-22: 200 mg via INTRAVENOUS
  Filled 2019-01-22: qty 40

## 2019-01-22 MED ORDER — PALONOSETRON HCL INJECTION 0.25 MG/5ML
0.2500 mg | Freq: Once | INTRAVENOUS | Status: AC
  Administered 2019-01-22: 0.25 mg via INTRAVENOUS

## 2019-01-22 MED ORDER — ATROPINE SULFATE 0.4 MG/ML IJ SOLN
0.4000 mg | Freq: Once | INTRAMUSCULAR | Status: AC | PRN
  Administered 2019-01-22: 0.4 mg via INTRAVENOUS

## 2019-01-22 MED ORDER — SODIUM CHLORIDE 0.9 % IV SOLN
400.0000 mg/m2 | Freq: Once | INTRAVENOUS | Status: AC
  Administered 2019-01-22: 976 mg via INTRAVENOUS
  Filled 2019-01-22: qty 48.8

## 2019-01-22 MED ORDER — DEXTROSE 5 % IV SOLN
Freq: Once | INTRAVENOUS | Status: AC
  Administered 2019-01-22: 10:00:00 via INTRAVENOUS
  Filled 2019-01-22: qty 250

## 2019-01-22 MED ORDER — ATROPINE SULFATE 1 MG/ML IJ SOLN: 0.5000 mg | Freq: Once | INTRAMUSCULAR | Status: DC | PRN

## 2019-01-22 MED ORDER — SODIUM CHLORIDE 0.9 % IV SOLN
150.0000 mg/m2 | Freq: Once | INTRAVENOUS | Status: AC
  Administered 2019-01-22: 360 mg via INTRAVENOUS
  Filled 2019-01-22: qty 15

## 2019-01-22 MED ORDER — PALONOSETRON HCL INJECTION 0.25 MG/5ML
INTRAVENOUS | Status: AC
  Filled 2019-01-22: qty 5

## 2019-01-22 NOTE — Patient Instructions (Signed)
Coronavirus (COVID-19) Are you at risk?  Are you at risk for the Coronavirus (COVID-19)?  To be considered HIGH RISK for Coronavirus (COVID-19), you have to meet the following criteria:  . Traveled to [place], [place], [place], [place] or [place]; or in the [place] to [place], [place], [place], or [place]; and have fever, cough, and shortness of breath within the last 2 weeks of travel OR . Been in close contact with a person diagnosed with COVID-19 within the last 2 weeks and have fever, cough, and shortness of breath . IF YOU DO NOT MEET THESE CRITERIA, YOU ARE CONSIDERED LOW RISK FOR COVID-19.  What to do if you are HIGH RISK for COVID-19?  . If you are having a medical emergency, call 911. . Seek medical care right away. Before you go to a doctor's office, urgent care or emergency department, call ahead and tell them about your recent travel, contact with someone diagnosed with COVID-19, and your symptoms. You should receive instructions from your physician's office regarding next steps of care.  . When you arrive at healthcare provider, tell the healthcare staff immediately you have returned from visiting [place], [place], [place], [place] or [place]; or traveled in the [place] to [place], [place], [place], or [place]; in the last two weeks or you have been in close contact with a person diagnosed with COVID-19 in the last 2 weeks.   . Tell the health care staff about your symptoms: fever, cough and shortness of breath. . After you have been seen by a medical provider, you will be either: o Tested for (COVID-19) and discharged home on quarantine except to seek medical care if symptoms worsen, and asked to  - Stay home and avoid contact with others until you get your results (4-5 days)  - Avoid travel on public transportation if possible (such as bus, train, or airplane) or o Sent to the Emergency Department by EMS for evaluation, COVID-19 testing, and possible  admission depending on your condition and test results.  What to do if you are LOW RISK for COVID-19?  Reduce your risk of any infection by using the same precautions used for avoiding the common cold or flu:  . Wash your hands often with soap and warm water for at least 20 seconds.  If soap and water are not readily available, use an alcohol-based hand sanitizer with at least 60% alcohol.  . If coughing or sneezing, cover your mouth and nose by coughing or sneezing into the elbow areas of your shirt or coat, into a tissue or into your sleeve (not your hands). . Avoid shaking hands with others and consider head nods or verbal greetings only. . Avoid touching your eyes, nose, or mouth with unwashed hands.  . Avoid close contact with people who are sick. . Avoid places or events with large numbers of people in one location, like concerts or sporting events. . Carefully consider travel plans you have or are making. . If you are planning any travel outside or inside the [place], visit the CDC's Travelers' Health webpage for the latest health notices. . If you have some symptoms but not all symptoms, continue to monitor at home and seek medical attention if your symptoms worsen. . If you are having a medical emergency, call 911.   ADDITIONAL HEALTHCARE OPTIONS FOR PATIENTS  [facility] / e-Visit: [email or web address]         [facility]: [phone]  Onset   Urgent Care: [facility]: [place] Discharge Instructions for Patients Receiving Chemotherapy  Today you received the following chemotherapy agents Oxaliplatin, Leucovorin, Irinotecan and Adrucil   To help prevent nausea and vomiting after your treatment, we encourage you to take your nausea medication as directed.    If you develop nausea and vomiting that is not controlled by your nausea  medication, call the clinic.   BELOW ARE SYMPTOMS THAT SHOULD BE REPORTED IMMEDIATELY:  *FEVER GREATER THAN 100.5 F  *CHILLS WITH OR WITHOUT FEVER  NAUSEA AND VOMITING THAT IS NOT CONTROLLED WITH YOUR NAUSEA MEDICATION  *UNUSUAL SHORTNESS OF BREATH  *UNUSUAL BRUISING OR BLEEDING  TENDERNESS IN MOUTH AND THROAT WITH OR WITHOUT PRESENCE OF ULCERS  *URINARY PROBLEMS  *BOWEL PROBLEMS  UNUSUAL RASH Items with * indicate a potential emergency and should be followed up as soon as possible.  Feel free to call the clinic should you have any questions or concerns. The clinic phone number is [phone].  Please show the [facility] at check-in to the Emergency Department and triage nurse.

## 2019-01-23 ENCOUNTER — Encounter: Payer: Self-pay | Admitting: Hematology

## 2019-01-23 ENCOUNTER — Telehealth: Payer: Self-pay | Admitting: Hematology

## 2019-01-23 NOTE — Telephone Encounter (Signed)
Scheduled appt per 6/17 los. Spoke with patient wife and she is aware of the appt date and time. I also gave the patient wife the number to central radiology.

## 2019-01-24 ENCOUNTER — Other Ambulatory Visit: Payer: Self-pay

## 2019-01-24 ENCOUNTER — Inpatient Hospital Stay: Payer: BC Managed Care – PPO

## 2019-01-24 VITALS — BP 138/90 | HR 88 | Temp 98.9°F | Resp 18

## 2019-01-24 DIAGNOSIS — C252 Malignant neoplasm of tail of pancreas: Secondary | ICD-10-CM | POA: Diagnosis not present

## 2019-01-24 DIAGNOSIS — Z7189 Other specified counseling: Secondary | ICD-10-CM

## 2019-01-24 MED ORDER — HEPARIN SOD (PORK) LOCK FLUSH 100 UNIT/ML IV SOLN
500.0000 [IU] | Freq: Once | INTRAVENOUS | Status: AC | PRN
  Administered 2019-01-24: 500 [IU]
  Filled 2019-01-24: qty 5

## 2019-01-24 MED ORDER — PEGFILGRASTIM-CBQV 6 MG/0.6ML ~~LOC~~ SOSY
PREFILLED_SYRINGE | SUBCUTANEOUS | Status: AC
  Filled 2019-01-24: qty 0.6

## 2019-01-24 MED ORDER — PEGFILGRASTIM-CBQV 6 MG/0.6ML ~~LOC~~ SOSY
6.0000 mg | PREFILLED_SYRINGE | Freq: Once | SUBCUTANEOUS | Status: AC
  Administered 2019-01-24: 6 mg via SUBCUTANEOUS

## 2019-01-24 MED ORDER — SODIUM CHLORIDE 0.9% FLUSH
10.0000 mL | INTRAVENOUS | Status: DC | PRN
  Administered 2019-01-24: 10 mL
  Filled 2019-01-24: qty 10

## 2019-01-24 MED ORDER — SODIUM CHLORIDE 0.9 % IV SOLN
INTRAVENOUS | Status: AC
  Administered 2019-01-24: 14:00:00 via INTRAVENOUS
  Filled 2019-01-24 (×2): qty 250

## 2019-01-24 NOTE — Patient Instructions (Signed)
Pegfilgrastim injection Ellen Henri) What is this medicine? PEGFILGRASTIM (PEG fil gra stim) is a long-acting granulocyte colony-stimulating factor that stimulates the growth of neutrophils, a type of white blood cell important in the body's fight against infection. It is used to reduce the incidence of fever and infection in patients with certain types of cancer who are receiving chemotherapy that affects the bone marrow, and to increase survival after being exposed to high doses of radiation. This medicine may be used for other purposes; ask your health care provider or pharmacist if you have questions. COMMON BRAND NAME(S): Domenic Moras, UDENYCA What should I tell my health care provider before I take this medicine? They need to know if you have any of these conditions: -kidney disease -latex allergy -ongoing radiation therapy -sickle cell disease -skin reactions to acrylic adhesives (On-Body Injector only) -an unusual or allergic reaction to pegfilgrastim, filgrastim, other medicines, foods, dyes, or preservatives -pregnant or trying to get pregnant -breast-feeding How should I use this medicine? This medicine is for injection under the skin. If you get this medicine at home, you will be taught how to prepare and give the pre-filled syringe or how to use the On-body Injector. Refer to the patient Instructions for Use for detailed instructions. Use exactly as directed. Tell your healthcare provider immediately if you suspect that the On-body Injector may not have performed as intended or if you suspect the use of the On-body Injector resulted in a missed or partial dose. It is important that you put your used needles and syringes in a special sharps container. Do not put them in a trash can. If you do not have a sharps container, call your pharmacist or healthcare provider to get one. Talk to your pediatrician regarding the use of this medicine in children. While this drug may be prescribed  for selected conditions, precautions do apply. Overdosage: If you think you have taken too much of this medicine contact a poison control center or emergency room at once. NOTE: This medicine is only for you. Do not share this medicine with others. What if I miss a dose? It is important not to miss your dose. Call your doctor or health care professional if you miss your dose. If you miss a dose due to an On-body Injector failure or leakage, a new dose should be administered as soon as possible using a single prefilled syringe for manual use. What may interact with this medicine? Interactions have not been studied. Give your health care provider a list of all the medicines, herbs, non-prescription drugs, or dietary supplements you use. Also tell them if you smoke, drink alcohol, or use illegal drugs. Some items may interact with your medicine. This list may not describe all possible interactions. Give your health care provider a list of all the medicines, herbs, non-prescription drugs, or dietary supplements you use. Also tell them if you smoke, drink alcohol, or use illegal drugs. Some items may interact with your medicine. What should I watch for while using this medicine? You may need blood work done while you are taking this medicine. If you are going to need a MRI, CT scan, or other procedure, tell your doctor that you are using this medicine (On-Body Injector only). What side effects may I notice from receiving this medicine? Side effects that you should report to your doctor or health care professional as soon as possible: -allergic reactions like skin rash, itching or hives, swelling of the face, lips, or tongue -back pain -dizziness -fever -pain,  redness, or irritation at site where injected -pinpoint red spots on the skin -red or dark-brown urine -shortness of breath or breathing problems -stomach or side pain, or pain at the shoulder -swelling -tiredness -trouble passing urine or  change in the amount of urine Side effects that usually do not require medical attention (report to your doctor or health care professional if they continue or are bothersome): -bone pain -muscle pain This list may not describe all possible side effects. Call your doctor for medical advice about side effects. You may report side effects to FDA at 1-800-FDA-1088. Where should I keep my medicine? Keep out of the reach of children. If you are using this medicine at home, you will be instructed on how to store it. Throw away any unused medicine after the expiration date on the label. NOTE: This sheet is a summary. It may not cover all possible information. If you have questions about this medicine, talk to your doctor, pharmacist, or health care provider.  2019 Elsevier/Gold Standard (2017-10-29 16:57:08)

## 2019-01-24 NOTE — Addendum Note (Signed)
Addended by: Truitt Merle on: 01/24/2019 08:35 AM   Modules accepted: Orders

## 2019-01-25 ENCOUNTER — Other Ambulatory Visit: Payer: Self-pay | Admitting: Hematology

## 2019-02-05 NOTE — Progress Notes (Signed)
[place]   Telephone:(336) [phone] Fax:(336) [phone]   Clinic Follow up Note   Patient Care Team: Leonard Downing, MD as PCP - General (Family Medicine)  Date of Service:  02/12/2019  CHIEF COMPLAINT: F/u of Pancreatic Cancer  SUMMARY OF ONCOLOGIC HISTORY: Oncology History Overview Note  Cancer Staging Pancreatic cancer [facility]) Staging form: Exocrine Pancreas, AJCC 8th Edition - Clinical stage from 10/29/2018: Stage IV (cT3, cN0, cM1) - Signed by Truitt Merle, MD on 11/05/2018     Pancreatic cancer ([name])  10/09/2018 Procedure   Upper Endoscopy by. Dr Paulita Fujita 10/29/18 IMPRESSION - There was no evidence of significant pathology in the left lobe of the liver. - There was no sign of significant pathology in the gallbladder. - Many abnormal lymph nodes were visualized in the splenic region (level 19), perigastric region and peripancreatic region. - A mass was identified in the pancreatic tail. This was staged T4 N2 Mx by endosonographic criteria. Fine needle aspiration performed.   10/10/2018 Imaging   CT Abdomen 10/10/18 IMPRESSION: 1. Uncomplicated acute diverticulitis of the proximal sigmoid colon. 2. Ill-defined low-density in the pancreatic tail measuring at least 4.7 cm, with possible punctate peripheral calcifications. This is suspicious for pancreatic neoplasm, however sequela of prior pancreatitis is also considered. Recommend further characterization with pancreatic protocol MRI when patient is able to tolerate breath hold technique. 3. 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 4. 16 mm cyst in the left lobe of the liver. Aortic Atherosclerosis (ICD10-I70.0).   10/16/2018 Imaging   MRI Abdomen at [facility] 10/16/18 IMPRESSION: 1. Mass in the tail the pancreas concerning for neoplasm. This contacts the spleen in the splenic hilum. 2. There is stranding in the fat extending from the tumor to  the celiac axis and left adrenal gland. Cannot exclude infiltrating tumor. 3. 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 4. metastatic lesion in the liver   10/29/2018 Initial Biopsy   Diagnosis 10/29/18 FINE NEEDLE ASPIRATION, ENDOSCOPIC, PANCREAS TAIL(SPECIMEN 1 OF 1 COLLECTED 10/29/18): MALIGNANT CELLS PRESENT, CONSISTENT WITH ADENOCARCINOMA. SEE COMMENT. COMMENT: THERE IS INSUFFICIENT TUMOR PRESENT FOR ADDITIONAL STUDIES.   10/29/2018 Cancer Staging   Staging form: Exocrine Pancreas, AJCC 8th Edition - Clinical stage from 10/29/2018: Stage IV (cT3, cN0, cM1) - Signed by Truitt Merle, MD on 11/05/2018   11/04/2018 Initial Diagnosis   Pancreatic cancer ([place])   11/27/2018 Imaging   CT Chest  IMPRESSION: 1.  No acute process or evidence of metastatic disease in the chest. 2. Locally advanced pancreatic carcinoma, as detailed previously. Cannot exclude superimposed pancreatitis. 3. New and enlarging liver lesions, suspicious for hepatic metastasis. 4. Aortic atherosclerosis (ICD10-I70.0), coronary artery atherosclerosis and emphysema (ICD10-J43.9).   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  The Common Hereditary Gene Panel offered by [facility] includes sequencing and/or deletion duplication testing of the following 48 genes: APC, ATM, AXIN2, BARD1, BMPR1A, BRCA1, BRCA2, BRIP1, CDH1, CDK4, CDKN2A (p14ARF), CDKN2A (p16INK4a), CHEK2, CTNNA1, DICER1, EPCAM (Deletion/duplication testing only), GREM1 (promoter region deletion/duplication testing only), KIT, MEN1, MLH1, MSH2, MSH3, MSH6, MUTYH, NBN, NF1, NHTL1, PALB2, PDGFRA, PMS2, POLD1, POLE, PTEN, RAD50, RAD51C, RAD51D, RNF43, SDHB, SDHC, SDHD, SMAD4, SMARCA4. STK11, TP53, TSC1, TSC2, and VHL.  The following genes were evaluated for sequence  changes only: SDHA and HOXB13 c.251G>A variant only.  The report date is December 04, 2018.,   02/11/2019 Imaging   CT AP W Contrast  IMPRESSION: 1. Stable appearing ill-defined pancreatic mass in the tail region extending into the splenic hilum. 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 2. The left hepatic lobe metastatic lesion is slightly larger and the right hepatic lobe lesion is slightly smaller. No new lesions are identified. 3. Small peripancreatic lymph nodes are stable. No retroperitoneal lymphadenopathy. 4. No worrisome pulmonary nodules at the lung bases and no worrisome bone lesions.      CURRENT THERAPY:  First lineFOLFIRINOX q2weeks starting4/23/20  INTERVAL HISTORY:  Vincent [name] is here for a follow up and treatment. She presents to the clinic alone. He called his wife to be included in the visit. He notes he is doing well s/p 5 cycles. He notes his appetite has increased and he still has stable dizzy spells, manageable. He wonders if his  This is related to his medication. He was taking a half tablet but still had dizziness occur. This occurs sporadic but always occur when he first stands up. He feels he is drinking enough liquids and ensure boost.  He would like a small break for his anniversary in August.    REVIEW OF SYSTEMS:   Constitutional: Denies fevers, chills or abnormal weight loss Eyes: Denies blurriness of vision Ears, nose, mouth, throat, and face: Denies mucositis or sore throat Respiratory: Denies cough, dyspnea or wheezes Cardiovascular: Denies palpitation, chest discomfort or lower extremity swelling Gastrointestinal:  Denies nausea, heartburn or change in bowel habits Skin: Denies abnormal skin rashes Lymphatics: Denies new lymphadenopathy or easy bruising Neurological:Denies numbness, tingling or new weaknesses Behavioral/Psych: Mood is stable, no new changes  All other systems were  reviewed with the patient and are negative.  MEDICAL HISTORY:  Past Medical History:  Diagnosis Date   Diabetes mellitus without complication ([place])    pt. states that he is not diabeticalthought he takes metformin   Eye hemorrhage    right   Family history of lung cancer    Hypertension    Monoallelic mutation of BRIP1 gene    Monoallelic mutation of MUTYH gene     SURGICAL HISTORY: Past Surgical History:  Procedure Laterality Date   ESOPHAGOGASTRODUODENOSCOPY (EGD) WITH PROPOFOL N/A 10/29/2018   Procedure: ESOPHAGOGASTRODUODENOSCOPY (EGD) WITH PROPOFOL;  Surgeon: Arta Silence, MD;  Location: [facility];  Service: Endoscopy;  Laterality: N/A;   EUS N/A 10/29/2018   Procedure: FULL UPPER ENDOSCOPIC ULTRASOUND (EUS) RADIAL;  Surgeon: Arta Silence, MD;  Location: [facility];  Service: Endoscopy;  Laterality: N/A;   FINE NEEDLE ASPIRATION N/A 10/29/2018   Procedure: FINE NEEDLE ASPIRATION (FNA) LINEAR;  Surgeon: Arta Silence, MD;  Location: [facility];  Service: Endoscopy;  Laterality: N/A;   IR IMAGING GUIDED PORT INSERTION  11/11/2018   LUMBAR LAMINECTOMY/DECOMPRESSION MICRODISCECTOMY Left 10/11/2017   Procedure: Microlumbar decompression Lumbar Four-Five Left;  Surgeon: Susa Day, MD;  Location: [facility];  Service: Orthopedics;  Laterality: Left;  Microlumbar decompression Lumbar Four-Five Left   removal of wisdom teeth  1977    I have reviewed the social history and family history with the patient and they are unchanged from previous note.  ALLERGIES:  has No Known Allergies.  MEDICATIONS:  Current Outpatient Medications  Medication Sig Dispense Refill   Accu-Chek FastClix Lancets MISC daily. for testing     aspirin EC 81 MG tablet Take 1 tablet (81 mg total) by mouth daily after breakfast.  Resume 4 days post-op     Ginger, Zingiber officinalis, (GINGER ROOT PO) Take 750 mg by mouth.     HYDROcodone-acetaminophen (NORCO) 5-325 MG tablet Take 1 tablet  by mouth every 6 (six) hours as needed for moderate pain. 30 tablet 0   KLOR-CON M20 20 MEQ tablet TAKE 1 TABLET (20 MEQ TOTAL) BY MOUTH EVERY OTHER DAY. 30 tablet 0   lidocaine-prilocaine (EMLA) cream Apply to affected area once 30 g 3   lisinopril (PRINIVIL,ZESTRIL) 10 MG tablet Take 10 mg by mouth daily after breakfast.     metFORMIN (GLUCOPHAGE) 1000 MG tablet TAKE 1 TABLET BY MOUTH AT BREAKFAST AND 1/2 TABLET AT SUPPER     ondansetron (ZOFRAN) 8 MG tablet Take 1 tablet (8 mg total) by mouth 2 (two) times daily as needed for refractory nausea / vomiting. Start on day 3 after chemotherapy. 30 tablet 1   oxyCODONE (OXY IR/ROXICODONE) 5 MG immediate release tablet Take 1-2 tablets (5-10 mg total) by mouth every 6 (six) hours as needed for severe pain. 45 tablet 0   pantoprazole (PROTONIX) 40 MG tablet Take 1 tablet (40 mg total) by mouth daily. 30 tablet 0   prochlorperazine (COMPAZINE) 10 MG tablet TAKE 1 TABLET (10 MG TOTAL) BY MOUTH EVERY 6 (SIX) HOURS AS NEEDED (NAUSEA). 30 tablet 1   simvastatin (ZOCOR) 40 MG tablet Take 20 mg by mouth every evening.     traMADol (ULTRAM) 50 MG tablet Take 2 tablets (100 mg total) by mouth every 6 (six) hours as needed. 120 tablet 1   No current facility-administered medications for this visit.    Facility-Administered Medications Ordered in Other Visits  Medication Dose Route Frequency Provider Last Rate Last Dose   fluorouracil (ADRUCIL) 5,850 mg in sodium chloride 0.9 % 133 mL chemo infusion  2,400 mg/m2 (Treatment Plan Recorded) Intravenous 1 day or 1 dose Truitt Merle, MD       irinotecan (CAMPTOSAR) 360 mg in sodium chloride 0.9 % 500 mL chemo infusion  150 mg/m2 (Treatment Plan Recorded) Intravenous Once Truitt Merle, MD 345 mL/hr at 02/12/19 1242 360 mg at 02/12/19 1242   leucovorin 976 mg in sodium chloride 0.9 % 250 mL infusion  400 mg/m2 (Treatment Plan Recorded) Intravenous Once Truitt Merle, MD       sodium chloride flush (NS) 0.9 %  injection 10 mL  10 mL Intracatheter PRN Truitt Merle, MD        PHYSICAL EXAMINATION: ECOG PERFORMANCE STATUS: 1 - Symptomatic but completely ambulatory  Vitals:   02/12/19 0850  BP: 120/89  Pulse: 93  Resp: 17  Temp: 98 F (36.7 C)  SpO2: 98%   Filed Weights   02/12/19 0850  Weight: 216 lb (98 kg)    GENERAL:alert, no distress and comfortable SKIN: skin color, texture, turgor are normal, no rashes or significant lesions EYES: normal, Conjunctiva are pink and non-injected, sclera clear  NECK: supple, thyroid normal size, non-tender, without nodularity LYMPH:  no palpable lymphadenopathy in the cervical, axillary  LUNGS: clear to auscultation and percussion with normal breathing effort HEART: regular rate & rhythm and no murmurs and no lower extremity edema ABDOMEN:abdomen soft, non-tender and normal bowel sounds Musculoskeletal:no cyanosis of digits and no clubbing  NEURO: alert & oriented x 3 with fluent speech, no focal motor/sensory deficits  LABORATORY DATA:  I have reviewed the data as listed CBC Latest Ref Rng & Units 02/12/2019 01/22/2019 01/08/2019  WBC 4.0 - 10.5 K/uL 5.7 6.5 8.8  Hemoglobin 13.0 -  17.0 g/dL 11.3(L) 11.5(L) 11.6(L)  Hematocrit 39.0 - 52.0 % 35.0(L) 34.8(L) 35.6(L)  Platelets 150 - 400 K/uL 177 145(L) 141(L)     CMP Latest Ref Rng & Units 02/12/2019 01/22/2019 01/08/2019  Glucose 70 - 99 mg/dL 150(H) 140(H) 178(H)  BUN 8 - 23 mg/dL '10 12 14  ' Creatinine 0.61 - 1.24 mg/dL 0.84 0.79 0.81  Sodium 135 - 145 mmol/L 142 143 141  Potassium 3.5 - 5.1 mmol/L 4.0 3.4(L) 3.7  Chloride 98 - 111 mmol/L 108 107 108  CO2 22 - 32 mmol/L '24 25 23  ' Calcium 8.9 - 10.3 mg/dL 8.8(L) 8.5(L) 8.7(L)  Total Protein 6.5 - 8.1 g/dL 6.5 6.3(L) 6.4(L)  Total Bilirubin 0.3 - 1.2 mg/dL 0.5 0.4 0.4  Alkaline Phos 38 - 126 U/L 98 113 121  AST 15 - 41 U/L '21 19 19  ' ALT 0 - 44 U/L 24 32 41      RADIOGRAPHIC STUDIES: I have personally reviewed the radiological images as listed and  agreed with the findings in the report. Ct Abdomen Pelvis W Contrast  Result Date: 02/11/2019 CLINICAL DATA:  Restaging pancreatic cancer. Initial diagnosis March 2020 EXAM: CT ABDOMEN AND PELVIS WITH CONTRAST TECHNIQUE: Multidetector CT imaging of the abdomen and pelvis was performed using the standard protocol following bolus administration of intravenous contrast. CONTRAST:  15m OMNIPAQUE IOHEXOL 300 MG/ML  SOLN COMPARISON:  Chest CT 11/27/2018 and outside MRI 10/16/2018 FINDINGS: Lower chest: The lung bases are clear of an acute process. No worrisome pulmonary nodules. No pleural effusion. The heart is normal in size. Coronary artery calcifications are noted. Hepatobiliary: 3.2 cm lesion in segment 4A measured 2 cm on the prior MRI and 2.9 cm on the prior CT scan. 12 mm lesion in segment 6 on image number 62 measured 9 mm on the prior MRI and 15 mm on the prior CT scan. No new hepatic lesions. No intrahepatic biliary dilatation. The gallbladder is normal. No common bile duct dilatation. Pancreas: Ill-defined mass in the tail the pancreas extending into the splenic hilum measures approximately 5.5 x 2.5 cm and appears overall stable. 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 Associated perfusion abnormality noted in the spleen. Spleen: Perfusion abnormality but no splenic lesions. Mild splenomegaly. Adrenals/Urinary Tract: The adrenal glands are unremarkable. No renal lesions or hydronephrosis. Bladder is unremarkable. Stomach/Bowel: The stomach, duodenum, small bowel and colon are unremarkable. No acute inflammatory changes, mass lesions or obstructive findings. Scattered colonic diverticulosis without findings for acute diverticulitis. The terminal ileum and appendix are normal. Vascular/Lymphatic: The aorta and branch vessels are patent. Other than the splenic vein the major venous structures are patent. Small peripancreatic lymph nodes are stable. There are  perigastric and perisplenic collateral vessels due to the splenic vein occlusion. The portal vein is patent. Reproductive: The prostate gland and seminal vesicles are unremarkable. Other: No pelvic mass or pelvic adenopathy. No free pelvic fluid collections. No inguinal mass or adenopathy. Musculoskeletal: No significant bony findings. There are degenerative changes involving the spine, hips and SI joints. IMPRESSION: 1. Stable appearing ill-defined pancreatic mass in the tail region extending into the splenic hilum. 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 2. The left hepatic lobe metastatic lesion is slightly larger and the right hepatic lobe lesion is slightly smaller. No new lesions are identified. 3. Small peripancreatic lymph nodes are stable. No retroperitoneal lymphadenopathy. 4. No worrisome pulmonary nodules at the lung bases and  no worrisome bone lesions. Electronically Signed   By: Marijo Sanes M.D.   On: 02/11/2019 19:27     ASSESSMENT & PLAN:  Vincent [name] is a 68 y.o. male with   1. Adenocarcinoma oftail of Pancreas, cT3N0Mx, with liver metastasis -He was recently diagnosedin3/2020.  -His CT APand abdominal MRI showed a 1.6cm lesion in thedorm, although it appears likely a benign cyst on the CT scan, however the MRI features are concerning for livermetastasis. -I discussed to confirm with definitive prognosis we can do liver biopsy. Given thetypical scan findings and growth over time, the liver lesions are likely metastatic lesions. Given the current COVID-19,I will probablyskip biopsy. Heagreed. -I discussed if metastatic disease, he is no longer a surgical candidate and his cancer is no longer curable. Goal of therapy would be palliative toprolong his life and improve his quality of life. He understands. -He started first-line FOLFIRINOX every 2 weeks on 11/28/18. He is tolerating welloverallwith fatigue and  fluctuating appetite and cold sensitivity. Will watch for neuropathy. -He has had 2 episodes of dizziness and syncope upon standing with cycle 4. I will add IV fluids to pump d/c  -We discussed CT AP from yesterday which shows overall stable pancreatic mass in the tail region, left liver lesions is slightly larger and right liver lesion is slightly smaller. Peripancreatic LN are stable. No new lesions. Overall mixed response/stable disease.  --I discussed we can continue current treatment, but it is possible his disease will truly progress on this regimen soon which will indicate we change to second line chemo Abraxane and Gemcitabine. He understands.  -He is tolerating current regimen well, and performance status has improved.  -Lab reviewed, CBC WNL except Hg 11.3, CMP BG 150. CA 19.9 still pending. Overall adequate to proceed with FOLFIRINOX today  -He would like to have another small break for his anniversary in early August. Will arrange.  -F/u in 2 weeks   2. Epigastricand LUQpainradiating to his back -secondary topancreaticcancer -Ipreviouslyprescribed him NORCObut does not feel it helps as much as Tramadol. I advised him not to go over 8 tabs Tramadol in a day. -Abdominal painhas revolvedsince he started chemo, back pain manageable with tramadol and tylenol.He is not taking norco or oxycodone.Stable.   3. Lower appetite, Weight loss  -Eating exacerbates his epigastric pain and nausea -He has lost 40 poundswhen he was diagnosed -He drinks protein shakeslike Glucerna, supplements, and flavored water. -Continue tofollow up with Dietician -appetite stable and improved, weight stable.   4. DM, HTN  -On Metformin and Lisinopril -We will monitor his blood pressure and blood glucose closely during the chemotherapy. May adjustmedication during chemoif needed -Continue to f/u with PCP -Due to hypotension he takes lisinopril as needed. Given recent 2 episodes of  dizziness upon standing and syncope, I added IV Fluids with pump D/c   -BP 120/89 today (02/12/19).  -I encouraged him to watch his BP when he has dizziness. If he does feel dizzy can should increase water intake and hold Lisinopril. Otherwise he can take half tablets.   5. Hypokalemia  -will continue toincrease potassium in his diet and continue oral potassium -K at 4.0 today (02/12/19).   6. 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  -Will discuss results with Santiago Glad   PLAN: -CT AP reviewed, shows overall stable disease  -labs reviewed and adequate to proceed with C78mOLFIRINOX todaywith Udenyca on day 3 -  lab, flush,f/uand chemo in 2 weeks    No problem-specific Assessment & Plan notes found for this encounter.   No orders of the defined types were placed in this encounter.  All questions were answered. The patient knows to call the clinic with any problems, questions or concerns. No barriers to learning was detected. I spent 25 minutes counseling the patient face to face. The total time spent in the appointment was 30 minutes and more than 50% was on counseling and review of test results     Truitt Merle, MD 02/12/2019   I, Joslyn Devon, am acting as scribe for Truitt Merle, MD.   I have reviewed the above documentation for accuracy and completeness, and I agree with the above.

## 2019-02-09 ENCOUNTER — Encounter: Payer: Self-pay | Admitting: Hematology

## 2019-02-10 ENCOUNTER — Telehealth: Payer: Self-pay

## 2019-02-10 NOTE — Telephone Encounter (Signed)
Spoke with patient regarding CT appointment which I scheduled for 7/7 at 4:00 to arrive at 3:45 NPO 4 hours before, he will pick up bottles of prep today and I have instructed him to drink one bottle at 2:00 and one bottle at 3:00.  He verbalized an understanding.

## 2019-02-11 ENCOUNTER — Encounter (INDEPENDENT_AMBULATORY_CARE_PROVIDER_SITE_OTHER): Payer: BC Managed Care – PPO | Admitting: Ophthalmology

## 2019-02-11 ENCOUNTER — Ambulatory Visit (HOSPITAL_COMMUNITY)
Admission: RE | Admit: 2019-02-11 | Discharge: 2019-02-11 | Disposition: A | Discharge status: Home / Self Care | Payer: BC Managed Care – PPO | Source: Ambulatory Visit | Attending: Hematology | Admitting: Hematology

## 2019-02-11 ENCOUNTER — Other Ambulatory Visit: Payer: Self-pay

## 2019-02-11 DIAGNOSIS — H34811 Central retinal vein occlusion, right eye, with macular edema: Secondary | ICD-10-CM | POA: Diagnosis not present

## 2019-02-11 DIAGNOSIS — H43813 Vitreous degeneration, bilateral: Secondary | ICD-10-CM

## 2019-02-11 DIAGNOSIS — I10 Essential (primary) hypertension: Secondary | ICD-10-CM | POA: Diagnosis not present

## 2019-02-11 DIAGNOSIS — H35033 Hypertensive retinopathy, bilateral: Secondary | ICD-10-CM

## 2019-02-11 DIAGNOSIS — H34231 Retinal artery branch occlusion, right eye: Secondary | ICD-10-CM

## 2019-02-11 DIAGNOSIS — C250 Malignant neoplasm of head of pancreas: Secondary | ICD-10-CM | POA: Insufficient documentation

## 2019-02-11 DIAGNOSIS — H2513 Age-related nuclear cataract, bilateral: Secondary | ICD-10-CM

## 2019-02-11 MED ORDER — SODIUM CHLORIDE (PF) 0.9 % IJ SOLN
INTRAMUSCULAR | Status: AC
  Filled 2019-02-11: qty 50

## 2019-02-11 MED ORDER — IOHEXOL 300 MG/ML  SOLN
100.0000 mL | Freq: Once | INTRAMUSCULAR | Status: AC | PRN
  Administered 2019-02-11: 16:00:00 100 mL via INTRAVENOUS

## 2019-02-12 ENCOUNTER — Inpatient Hospital Stay: Payer: BC Managed Care – PPO

## 2019-02-12 ENCOUNTER — Inpatient Hospital Stay (HOSPITAL_BASED_OUTPATIENT_CLINIC_OR_DEPARTMENT_OTHER): Payer: BC Managed Care – PPO | Admitting: Hematology

## 2019-02-12 ENCOUNTER — Telehealth: Payer: Self-pay | Admitting: Hematology

## 2019-02-12 ENCOUNTER — Encounter: Payer: Self-pay | Admitting: Hematology

## 2019-02-12 ENCOUNTER — Inpatient Hospital Stay: Payer: BC Managed Care – PPO | Admitting: Nutrition

## 2019-02-12 ENCOUNTER — Inpatient Hospital Stay: Payer: BC Managed Care – PPO | Attending: Hematology

## 2019-02-12 ENCOUNTER — Other Ambulatory Visit: Payer: Self-pay

## 2019-02-12 VITALS — BP 120/89 | HR 93 | Temp 98.0°F | Resp 17 | Ht 76.0 in | Wt 216.0 lb

## 2019-02-12 DIAGNOSIS — I10 Essential (primary) hypertension: Secondary | ICD-10-CM | POA: Insufficient documentation

## 2019-02-12 DIAGNOSIS — C252 Malignant neoplasm of tail of pancreas: Secondary | ICD-10-CM | POA: Diagnosis present

## 2019-02-12 DIAGNOSIS — E876 Hypokalemia: Secondary | ICD-10-CM

## 2019-02-12 DIAGNOSIS — J439 Emphysema, unspecified: Secondary | ICD-10-CM

## 2019-02-12 DIAGNOSIS — E119 Type 2 diabetes mellitus without complications: Secondary | ICD-10-CM

## 2019-02-12 DIAGNOSIS — Z5111 Encounter for antineoplastic chemotherapy: Secondary | ICD-10-CM | POA: Insufficient documentation

## 2019-02-12 DIAGNOSIS — Z7982 Long term (current) use of aspirin: Secondary | ICD-10-CM | POA: Diagnosis not present

## 2019-02-12 DIAGNOSIS — Z79899 Other long term (current) drug therapy: Secondary | ICD-10-CM | POA: Diagnosis not present

## 2019-02-12 DIAGNOSIS — C250 Malignant neoplasm of head of pancreas: Secondary | ICD-10-CM

## 2019-02-12 DIAGNOSIS — Z7984 Long term (current) use of oral hypoglycemic drugs: Secondary | ICD-10-CM

## 2019-02-12 DIAGNOSIS — C787 Secondary malignant neoplasm of liver and intrahepatic bile duct: Secondary | ICD-10-CM | POA: Insufficient documentation

## 2019-02-12 DIAGNOSIS — Z7189 Other specified counseling: Secondary | ICD-10-CM

## 2019-02-12 DIAGNOSIS — Z95828 Presence of other vascular implants and grafts: Secondary | ICD-10-CM

## 2019-02-12 DIAGNOSIS — Z5189 Encounter for other specified aftercare: Secondary | ICD-10-CM | POA: Insufficient documentation

## 2019-02-12 LAB — CMP (CANCER CENTER ONLY)
ALT: 24 U/L (ref 0–44)
AST: 21 U/L (ref 15–41)
Albumin: 3.4 g/dL — ABNORMAL LOW (ref 3.5–5.0)
Alkaline Phosphatase: 98 U/L (ref 38–126)
Anion gap: 10 (ref 5–15)
BUN: 10 mg/dL (ref 8–23)
CO2: 24 mmol/L (ref 22–32)
Calcium: 8.8 mg/dL — ABNORMAL LOW (ref 8.9–10.3)
Chloride: 108 mmol/L (ref 98–111)
Creatinine: 0.84 mg/dL (ref 0.61–1.24)
GFR, Est AFR Am: >60 mL/min (ref >60)
GFR, Estimated: >60 mL/min (ref >60)
Glucose, Bld: 150 mg/dL — ABNORMAL HIGH (ref 70–99)
Potassium: 4 mmol/L (ref 3.5–5.1)
Sodium: 142 mmol/L (ref 135–145)
Total Bilirubin: 0.5 mg/dL (ref 0.3–1.2)
Total Protein: 6.5 g/dL (ref 6.5–8.1)

## 2019-02-12 LAB — CBC WITH DIFFERENTIAL (CANCER CENTER ONLY)
Abs Immature Granulocytes: 0.03 10*3/uL (ref 0.00–0.07)
Basophils Absolute: 0.1 10*3/uL (ref 0.0–0.1)
Basophils Relative: 1 %
Eosinophils Absolute: 0.1 10*3/uL (ref 0.0–0.5)
Eosinophils Relative: 1 %
HCT: 35 % — ABNORMAL LOW (ref 39.0–52.0)
Hemoglobin: 11.3 g/dL — ABNORMAL LOW (ref 13.0–17.0)
Immature Granulocytes: 1 %
Lymphocytes Relative: 15 %
Lymphs Abs: 0.9 10*3/uL (ref 0.7–4.0)
MCH: 35.2 pg — ABNORMAL HIGH (ref 26.0–34.0)
MCHC: 32.3 g/dL (ref 30.0–36.0)
MCV: 109 fL — ABNORMAL HIGH (ref 80.0–100.0)
Monocytes Absolute: 0.5 10*3/uL (ref 0.1–1.0)
Monocytes Relative: 8 %
Neutro Abs: 4.2 10*3/uL (ref 1.7–7.7)
Neutrophils Relative %: 74 %
Platelet Count: 177 10*3/uL (ref 150–400)
RBC: 3.21 MIL/uL — ABNORMAL LOW (ref 4.22–5.81)
RDW: 17.5 % — ABNORMAL HIGH (ref 11.5–15.5)
WBC Count: 5.7 10*3/uL (ref 4.0–10.5)
nRBC: 0 % (ref 0.0–0.2)

## 2019-02-12 MED ORDER — ATROPINE SULFATE 0.4 MG/ML IJ SOLN
0.4000 mg | Freq: Once | INTRAMUSCULAR | Status: AC | PRN
  Administered 2019-02-12: 0.4 mg via INTRAVENOUS

## 2019-02-12 MED ORDER — DEXTROSE 5 % IV SOLN
Freq: Once | INTRAVENOUS | Status: AC
  Administered 2019-02-12: 09:00:00 via INTRAVENOUS
  Filled 2019-02-12: qty 250

## 2019-02-12 MED ORDER — PALONOSETRON HCL INJECTION 0.25 MG/5ML
INTRAVENOUS | Status: AC
  Filled 2019-02-12: qty 5

## 2019-02-12 MED ORDER — OXALIPLATIN CHEMO INJECTION 100 MG/20ML
82.0000 mg/m2 | Freq: Once | INTRAVENOUS | Status: AC
  Administered 2019-02-12: 200 mg via INTRAVENOUS
  Filled 2019-02-12: qty 40

## 2019-02-12 MED ORDER — SODIUM CHLORIDE 0.9% FLUSH
10.0000 mL | INTRAVENOUS | Status: DC | PRN
  Administered 2019-02-12: 10 mL
  Filled 2019-02-12: qty 10

## 2019-02-12 MED ORDER — SODIUM CHLORIDE 0.9% FLUSH
10.0000 mL | Freq: Once | INTRAVENOUS | Status: AC
  Administered 2019-02-12: 10 mL
  Filled 2019-02-12: qty 10

## 2019-02-12 MED ORDER — DEXTROSE 5 % IV SOLN
Freq: Once | INTRAVENOUS | Status: AC
  Administered 2019-02-12: 13:00:00 via INTRAVENOUS
  Filled 2019-02-12: qty 250

## 2019-02-12 MED ORDER — LEUCOVORIN CALCIUM INJECTION 350 MG: 400.0000 mg/m2 | Freq: Once | INTRAVENOUS | Status: DC

## 2019-02-12 MED ORDER — SODIUM CHLORIDE 0.9 % IV SOLN
150.0000 mg/m2 | Freq: Once | INTRAVENOUS | Status: AC
  Administered 2019-02-12: 360 mg via INTRAVENOUS
  Filled 2019-02-12: qty 15

## 2019-02-12 MED ORDER — PALONOSETRON HCL INJECTION 0.25 MG/5ML
0.2500 mg | Freq: Once | INTRAVENOUS | Status: AC
  Administered 2019-02-12: 0.25 mg via INTRAVENOUS

## 2019-02-12 MED ORDER — SODIUM CHLORIDE 0.9 % IV SOLN
2400.0000 mg/m2 | INTRAVENOUS | Status: DC
  Administered 2019-02-12: 5850 mg via INTRAVENOUS
  Filled 2019-02-12: qty 117

## 2019-02-12 MED ORDER — ATROPINE SULFATE 0.4 MG/ML IJ SOLN
INTRAMUSCULAR | Status: AC
  Filled 2019-02-12: qty 1

## 2019-02-12 MED ORDER — SODIUM CHLORIDE 0.9 % IV SOLN
400.0000 mg/m2 | Freq: Once | INTRAVENOUS | Status: AC
  Administered 2019-02-12: 976 mg via INTRAVENOUS
  Filled 2019-02-12: qty 48.8

## 2019-02-12 MED ORDER — SODIUM CHLORIDE 0.9 % IV SOLN
Freq: Once | INTRAVENOUS | Status: AC
  Administered 2019-02-12: 10:00:00 via INTRAVENOUS
  Filled 2019-02-12: qty 5

## 2019-02-12 NOTE — Telephone Encounter (Signed)
Scheduled appt per 7/8 los. °

## 2019-02-12 NOTE — Patient Instructions (Signed)

## 2019-02-12 NOTE — Progress Notes (Signed)
RD working remotely.  Contacted patient at home and on cell phone with no answer. Left pt a message to return my call if he has any needs. He has my contact information.

## 2019-02-12 NOTE — Patient Instructions (Signed)
[facility] Discharge Instructions for Patients Receiving Chemotherapy  Today you received the following chemotherapy agents Oxaliplatin, Leucovorin, Fluorouracil.   To help prevent nausea and vomiting after your treatment, we encourage you to take your nausea medication as directed.  If you develop nausea and vomiting that is not controlled by your nausea medication, call the clinic.   BELOW ARE SYMPTOMS THAT SHOULD BE REPORTED IMMEDIATELY:  *FEVER GREATER THAN 100.5 F  *CHILLS WITH OR WITHOUT FEVER  NAUSEA AND VOMITING THAT IS NOT CONTROLLED WITH YOUR NAUSEA MEDICATION  *UNUSUAL SHORTNESS OF BREATH  *UNUSUAL BRUISING OR BLEEDING  TENDERNESS IN MOUTH AND THROAT WITH OR WITHOUT PRESENCE OF ULCERS  *URINARY PROBLEMS  *BOWEL PROBLEMS  UNUSUAL RASH Items with * indicate a potential emergency and should be followed up as soon as possible.  Feel free to call the clinic should you have any questions or concerns. The clinic phone number is [phone].  Please show the CHEMO ALERT CARD at check-in to the Emergency Department and triage nurse.   

## 2019-02-13 ENCOUNTER — Inpatient Hospital Stay (HOSPITAL_BASED_OUTPATIENT_CLINIC_OR_DEPARTMENT_OTHER): Payer: BC Managed Care – PPO | Admitting: Medical

## 2019-02-13 ENCOUNTER — Telehealth: Payer: Self-pay

## 2019-02-13 DIAGNOSIS — C9000 Multiple myeloma not having achieved remission: Secondary | ICD-10-CM

## 2019-02-13 LAB — CANCER ANTIGEN 19-9: CA 19-9: 3650 U/mL — ABNORMAL HIGH (ref 0–35)

## 2019-02-13 NOTE — Progress Notes (Signed)
Pump beeping. Needed to be reset by Ruben Im, RN

## 2019-02-13 NOTE — Telephone Encounter (Signed)
Spoke to patient regarding CA19.9 results significantly decreased from last time, patient verbalized an understanding.  He states he has had a problem with his pump all day, called the number reset it and it will be okay for a little bit then beeps again, he wants to come in for someone to replace the pump, patient is on his way.  Infusion appointment was made and charge RN was informed.

## 2019-02-13 NOTE — Progress Notes (Signed)
Pt arrived to [facility] with home infusion pump beeping erratically.  Pump and port needle assessed by RN Learta Codding and RN Thu, no issues found except for the pump itself continuing to beep while infusing.  Pump switched out for a new one with 120 ml of volume remaining.  New pump programmed for remainder of 120 ml to be infused at 5.4 ml/hr.  Pump observed to be fully functioning and infusing before patient left facility.  Pt aware to call back with any further issues.

## 2019-02-13 NOTE — Patient Instructions (Signed)

## 2019-02-14 ENCOUNTER — Telehealth: Payer: Self-pay | Admitting: Medical

## 2019-02-14 ENCOUNTER — Inpatient Hospital Stay: Payer: BC Managed Care – PPO

## 2019-02-14 ENCOUNTER — Other Ambulatory Visit: Payer: Self-pay

## 2019-02-14 VITALS — BP 121/89 | HR 78 | Temp 98.7°F | Resp 16

## 2019-02-14 DIAGNOSIS — C252 Malignant neoplasm of tail of pancreas: Secondary | ICD-10-CM

## 2019-02-14 DIAGNOSIS — Z7189 Other specified counseling: Secondary | ICD-10-CM

## 2019-02-14 MED ORDER — PEGFILGRASTIM-CBQV 6 MG/0.6ML ~~LOC~~ SOSY
6.0000 mg | PREFILLED_SYRINGE | Freq: Once | SUBCUTANEOUS | Status: AC
  Administered 2019-02-14: 6 mg via SUBCUTANEOUS

## 2019-02-14 MED ORDER — SODIUM CHLORIDE 0.9% FLUSH
10.0000 mL | INTRAVENOUS | Status: DC | PRN
  Administered 2019-02-14: 10 mL
  Filled 2019-02-14: qty 10

## 2019-02-14 MED ORDER — HEPARIN SOD (PORK) LOCK FLUSH 100 UNIT/ML IV SOLN
500.0000 [IU] | Freq: Once | INTRAVENOUS | Status: AC | PRN
  Administered 2019-02-14: 500 [IU]
  Filled 2019-02-14: qty 5

## 2019-02-14 NOTE — Telephone Encounter (Signed)
No los per 7/9. °

## 2019-02-14 NOTE — Patient Instructions (Signed)

## 2019-02-19 ENCOUNTER — Other Ambulatory Visit: Payer: Self-pay | Admitting: Hematology

## 2019-02-26 ENCOUNTER — Inpatient Hospital Stay: Payer: BC Managed Care – PPO

## 2019-02-26 ENCOUNTER — Other Ambulatory Visit: Payer: Self-pay

## 2019-02-26 ENCOUNTER — Inpatient Hospital Stay (HOSPITAL_BASED_OUTPATIENT_CLINIC_OR_DEPARTMENT_OTHER): Payer: BC Managed Care – PPO | Admitting: Hematology

## 2019-02-26 ENCOUNTER — Inpatient Hospital Stay: Payer: BC Managed Care – PPO | Admitting: Nutrition

## 2019-02-26 ENCOUNTER — Encounter: Payer: Self-pay | Admitting: Hematology

## 2019-02-26 ENCOUNTER — Telehealth: Payer: Self-pay | Admitting: Hematology

## 2019-02-26 VITALS — BP 101/80 | HR 96 | Temp 97.8°F | Resp 18 | Ht 76.0 in | Wt 211.2 lb

## 2019-02-26 DIAGNOSIS — Z7189 Other specified counseling: Secondary | ICD-10-CM

## 2019-02-26 DIAGNOSIS — C252 Malignant neoplasm of tail of pancreas: Secondary | ICD-10-CM

## 2019-02-26 DIAGNOSIS — C787 Secondary malignant neoplasm of liver and intrahepatic bile duct: Secondary | ICD-10-CM | POA: Diagnosis not present

## 2019-02-26 DIAGNOSIS — Z7982 Long term (current) use of aspirin: Secondary | ICD-10-CM

## 2019-02-26 DIAGNOSIS — E876 Hypokalemia: Secondary | ICD-10-CM

## 2019-02-26 DIAGNOSIS — J439 Emphysema, unspecified: Secondary | ICD-10-CM

## 2019-02-26 DIAGNOSIS — C250 Malignant neoplasm of head of pancreas: Secondary | ICD-10-CM

## 2019-02-26 DIAGNOSIS — Z79899 Other long term (current) drug therapy: Secondary | ICD-10-CM

## 2019-02-26 DIAGNOSIS — E119 Type 2 diabetes mellitus without complications: Secondary | ICD-10-CM

## 2019-02-26 DIAGNOSIS — I10 Essential (primary) hypertension: Secondary | ICD-10-CM

## 2019-02-26 DIAGNOSIS — Z95828 Presence of other vascular implants and grafts: Secondary | ICD-10-CM

## 2019-02-26 DIAGNOSIS — Z7984 Long term (current) use of oral hypoglycemic drugs: Secondary | ICD-10-CM

## 2019-02-26 LAB — CBC WITH DIFFERENTIAL (CANCER CENTER ONLY)
Abs Immature Granulocytes: 0.02 10*3/uL (ref 0.00–0.07)
Basophils Absolute: 0.1 10*3/uL (ref 0.0–0.1)
Basophils Relative: 1 %
Eosinophils Absolute: 0.1 10*3/uL (ref 0.0–0.5)
Eosinophils Relative: 2 %
HCT: 33.1 % — ABNORMAL LOW (ref 39.0–52.0)
Hemoglobin: 10.7 g/dL — ABNORMAL LOW (ref 13.0–17.0)
Immature Granulocytes: 0 %
Lymphocytes Relative: 24 %
Lymphs Abs: 1.2 10*3/uL (ref 0.7–4.0)
MCH: 35.1 pg — ABNORMAL HIGH (ref 26.0–34.0)
MCHC: 32.3 g/dL (ref 30.0–36.0)
MCV: 108.5 fL — ABNORMAL HIGH (ref 80.0–100.0)
Monocytes Absolute: 0.7 10*3/uL (ref 0.1–1.0)
Monocytes Relative: 13 %
Neutro Abs: 3.1 10*3/uL (ref 1.7–7.7)
Neutrophils Relative %: 60 %
Platelet Count: 121 10*3/uL — ABNORMAL LOW (ref 150–400)
RBC: 3.05 MIL/uL — ABNORMAL LOW (ref 4.22–5.81)
RDW: 15.9 % — ABNORMAL HIGH (ref 11.5–15.5)
WBC Count: 5.1 10*3/uL (ref 4.0–10.5)
nRBC: 0 % (ref 0.0–0.2)

## 2019-02-26 LAB — CMP (CANCER CENTER ONLY)
ALT: 16 U/L (ref 0–44)
AST: 14 U/L — ABNORMAL LOW (ref 15–41)
Albumin: 3.5 g/dL (ref 3.5–5.0)
Alkaline Phosphatase: 109 U/L (ref 38–126)
Anion gap: 10 (ref 5–15)
BUN: 10 mg/dL (ref 8–23)
CO2: 25 mmol/L (ref 22–32)
Calcium: 9.1 mg/dL (ref 8.9–10.3)
Chloride: 107 mmol/L (ref 98–111)
Creatinine: 0.82 mg/dL (ref 0.61–1.24)
GFR, Est AFR Am: >60 mL/min (ref >60)
GFR, Estimated: >60 mL/min (ref >60)
Glucose, Bld: 142 mg/dL — ABNORMAL HIGH (ref 70–99)
Potassium: 3.7 mmol/L (ref 3.5–5.1)
Sodium: 142 mmol/L (ref 135–145)
Total Bilirubin: 0.5 mg/dL (ref 0.3–1.2)
Total Protein: 6.4 g/dL — ABNORMAL LOW (ref 6.5–8.1)

## 2019-02-26 MED ORDER — ATROPINE SULFATE 0.4 MG/ML IJ SOLN
INTRAMUSCULAR | Status: AC
  Filled 2019-02-26: qty 1

## 2019-02-26 MED ORDER — TRAMADOL HCL 50 MG PO TABS: 100.0000 mg | ORAL_TABLET | Freq: Four times a day (QID) | ORAL | 0 refills | Status: DC | PRN

## 2019-02-26 MED ORDER — LEUCOVORIN CALCIUM INJECTION 350 MG
400.0000 mg/m2 | Freq: Once | INTRAVENOUS | Status: AC
  Administered 2019-02-26: 13:00:00 976 mg via INTRAVENOUS
  Filled 2019-02-26: qty 48.8

## 2019-02-26 MED ORDER — DEXTROSE 5 % IV SOLN
Freq: Once | INTRAVENOUS | Status: AC
  Administered 2019-02-26: 10:00:00 via INTRAVENOUS
  Filled 2019-02-26: qty 250

## 2019-02-26 MED ORDER — SODIUM CHLORIDE 0.9 % IV SOLN
2400.0000 mg/m2 | INTRAVENOUS | Status: DC
  Administered 2019-02-26: 5850 mg via INTRAVENOUS
  Filled 2019-02-26: qty 117

## 2019-02-26 MED ORDER — PALONOSETRON HCL INJECTION 0.25 MG/5ML
INTRAVENOUS | Status: AC
  Filled 2019-02-26: qty 5

## 2019-02-26 MED ORDER — ATROPINE SULFATE 1 MG/ML IJ SOLN
INTRAMUSCULAR | Status: AC
  Filled 2019-02-26: qty 1

## 2019-02-26 MED ORDER — IRINOTECAN HCL CHEMO INJECTION 100 MG/5ML
150.0000 mg/m2 | Freq: Once | INTRAVENOUS | Status: AC
  Administered 2019-02-26: 360 mg via INTRAVENOUS
  Filled 2019-02-26: qty 15

## 2019-02-26 MED ORDER — SODIUM CHLORIDE 0.9% FLUSH
10.0000 mL | Freq: Once | INTRAVENOUS | Status: AC
  Administered 2019-02-26: 10 mL
  Filled 2019-02-26: qty 10

## 2019-02-26 MED ORDER — PALONOSETRON HCL INJECTION 0.25 MG/5ML
0.2500 mg | Freq: Once | INTRAVENOUS | Status: AC
  Administered 2019-02-26: 0.25 mg via INTRAVENOUS

## 2019-02-26 MED ORDER — SODIUM CHLORIDE 0.9 % IV SOLN
Freq: Once | INTRAVENOUS | Status: AC
  Administered 2019-02-26: 10:00:00 via INTRAVENOUS
  Filled 2019-02-26: qty 5

## 2019-02-26 MED ORDER — OXALIPLATIN CHEMO INJECTION 100 MG/20ML
200.0000 mg | Freq: Once | INTRAVENOUS | Status: AC
  Administered 2019-02-26: 11:00:00 200 mg via INTRAVENOUS
  Filled 2019-02-26: qty 40

## 2019-02-26 MED ORDER — ATROPINE SULFATE 0.4 MG/ML IJ SOLN: 0.4000 mg | Freq: Once | INTRAMUSCULAR | Status: DC | PRN

## 2019-02-26 MED ORDER — ATROPINE SULFATE 1 MG/ML IJ SOLN
0.5000 mg | Freq: Once | INTRAMUSCULAR | Status: AC | PRN
  Administered 2019-02-26: 0.5 mg via INTRAVENOUS
  Filled 2019-02-26: qty 1

## 2019-02-26 NOTE — Progress Notes (Signed)
Vincent [name]   Telephone:([phone] Fax:([phone]   Clinic Follow up Note   Patient Care Team: Leonard Downing, MD as PCP - General (Family Medicine)  Date of Service:  02/26/2019  CHIEF COMPLAINT: F/u of Pancreatic Cancer   SUMMARY OF ONCOLOGIC HISTORY: Oncology History Overview Note  Cancer Staging Pancreatic cancer [facility]) Staging form: Exocrine Pancreas, AJCC 8th Edition - Clinical stage from 10/29/2018: Stage IV (cT3, cN0, cM1) - Signed by Truitt Merle, MD on 11/05/2018     Pancreatic cancer ([place])  10/09/2018 Procedure   Upper Endoscopy by. Dr Paulita Fujita 10/29/18 IMPRESSION - There was no evidence of significant pathology in the left lobe of the liver. - There was no sign of significant pathology in the gallbladder. - Many abnormal lymph nodes were visualized in the splenic region (level 19), perigastric region and peripancreatic region. - A mass was identified in the pancreatic tail. This was staged T4 N2 Mx by endosonographic criteria. Fine needle aspiration performed.   10/10/2018 Imaging   CT Abdomen 10/10/18 IMPRESSION: 1. Uncomplicated acute diverticulitis of the proximal sigmoid colon. 2. Ill-defined low-density in the pancreatic tail measuring at least 4.7 cm, with possible punctate peripheral calcifications. This is suspicious for pancreatic neoplasm, however sequela of prior pancreatitis is also considered. Recommend further characterization with pancreatic protocol MRI when patient is able to tolerate breath hold technique. 3. 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 4. 16 mm cyst in the left lobe of the liver. Aortic Atherosclerosis (ICD10-I70.0).   10/16/2018 Imaging   MRI Abdomen at [facility] 10/16/18 IMPRESSION: 1. Mass in the tail the pancreas concerning for neoplasm. This contacts the spleen in the splenic hilum. 2. There is stranding in the fat extending from the tumor to  the celiac axis and left adrenal gland. Cannot exclude infiltrating tumor. 3. 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 4. metastatic lesion in the liver   10/29/2018 Initial Biopsy   Diagnosis 10/29/18 FINE NEEDLE ASPIRATION, ENDOSCOPIC, PANCREAS TAIL(SPECIMEN 1 OF 1 COLLECTED 10/29/18): MALIGNANT CELLS PRESENT, CONSISTENT WITH ADENOCARCINOMA. SEE COMMENT. COMMENT: THERE IS INSUFFICIENT TUMOR PRESENT FOR ADDITIONAL STUDIES.   10/29/2018 Cancer Staging   Staging form: Exocrine Pancreas, AJCC 8th Edition - Clinical stage from 10/29/2018: Stage IV (cT3, cN0, cM1) - Signed by Truitt Merle, MD on 11/05/2018   11/04/2018 Initial Diagnosis   Pancreatic cancer ([place])   11/27/2018 Imaging   CT Chest  IMPRESSION: 1.  No acute process or evidence of metastatic disease in the chest. 2. Locally advanced pancreatic carcinoma, as detailed previously. Cannot exclude superimposed pancreatitis. 3. New and enlarging liver lesions, suspicious for hepatic metastasis. 4. Aortic atherosclerosis (ICD10-I70.0), coronary artery atherosclerosis and emphysema (ICD10-J43.9).   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  The Common Hereditary Gene Panel offered by [facility] includes sequencing and/or deletion duplication testing of the following 48 genes: APC, ATM, AXIN2, BARD1, BMPR1A, BRCA1, BRCA2, BRIP1, CDH1, CDK4, CDKN2A (p14ARF), CDKN2A (p16INK4a), CHEK2, CTNNA1, DICER1, EPCAM (Deletion/duplication testing only), GREM1 (promoter region deletion/duplication testing only), KIT, MEN1, MLH1, MSH2, MSH3, MSH6, MUTYH, NBN, NF1, NHTL1, PALB2, PDGFRA, PMS2, POLD1, POLE, PTEN, RAD50, RAD51C, RAD51D, RNF43, SDHB, SDHC, SDHD, SMAD4, SMARCA4. STK11, TP53, TSC1, TSC2, and VHL.  The following genes were evaluated for sequence  changes only: SDHA and HOXB13 c.251G>A variant  only. The report date is December 04, 2018.,   02/11/2019 Imaging   CT AP W Contrast  IMPRESSION: 1. Stable appearing ill-defined pancreatic mass in the tail region extending into the splenic hilum. 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 2. The left hepatic lobe metastatic lesion is slightly larger and the right hepatic lobe lesion is slightly smaller. No new lesions are identified. 3. Small peripancreatic lymph nodes are stable. No retroperitoneal lymphadenopathy. 4. No worrisome pulmonary nodules at the lung bases and no worrisome bone lesions.      CURRENT THERAPY:  First lineFOLFIRINOX q2weeks starting4/23/20  INTERVAL HISTORY:  Vincent [name] is here for a follow up and treatment. He presents to the clinic alone. He notes he is doing well. He notes after chemo he will have diarrhea with mild weight loss. He notes with adequate eating he will gain it back. He notes he has adequate appetite. He notes last night he had a lot of muscle cramping pain. He took a few tramadol and was able to return to sleep. He has not been using tramadol often, only for significant pain. He would like a refill. He is not using oxycodone right now. I reviewed his medication list with him.  He notes his BG last night was 124. Overall doing well for him. He notes intermittent neuropathy, nothing constant or impactful.    REVIEW OF SYSTEMS:   Constitutional: Denies fevers, chills (+) Mild weight loss Eyes: Denies blurriness of vision Ears, nose, mouth, throat, and face: Denies mucositis or sore throat Respiratory: Denies cough, dyspnea or wheezes Cardiovascular: Denies palpitation, chest discomfort or lower extremity swelling Gastrointestinal:  Denies nausea, heartburn or change in bowel habits Skin: Denies abnormal skin rashes Lymphatics: Denies new lymphadenopathy or easy bruising Neurological: (+)  intermittent neuropathy  Behavioral/Psych: Mood is stable, no new changes  All other systems were reviewed with the patient and are negative.  MEDICAL HISTORY:  Past Medical History:  Diagnosis Date  . Diabetes mellitus without complication ([place])    pt. states that he is not diabeticalthought he takes metformin  . Eye hemorrhage    right  . Family history of lung cancer   . Hypertension   . Monoallelic mutation of BRIP1 gene   . Monoallelic mutation of MUTYH gene     SURGICAL HISTORY: Past Surgical History:  Procedure Laterality Date  . ESOPHAGOGASTRODUODENOSCOPY (EGD) WITH PROPOFOL N/A 10/29/2018   Procedure: ESOPHAGOGASTRODUODENOSCOPY (EGD) WITH PROPOFOL;  Surgeon: Arta Silence, MD;  Location: [facility];  Service: Endoscopy;  Laterality: N/A;  . EUS N/A 10/29/2018   Procedure: FULL UPPER ENDOSCOPIC ULTRASOUND (EUS) RADIAL;  Surgeon: Arta Silence, MD;  Location: [facility];  Service: Endoscopy;  Laterality: N/A;  . FINE NEEDLE ASPIRATION N/A 10/29/2018   Procedure: FINE NEEDLE ASPIRATION (FNA) LINEAR;  Surgeon: Arta Silence, MD;  Location: [facility];  Service: Endoscopy;  Laterality: N/A;  . IR IMAGING GUIDED PORT INSERTION  11/11/2018  . LUMBAR LAMINECTOMY/DECOMPRESSION MICRODISCECTOMY Left 10/11/2017   Procedure: Microlumbar decompression Lumbar Four-Five Left;  Surgeon: Susa Day, MD;  Location: [facility];  Service: Orthopedics;  Laterality: Left;  Microlumbar decompression Lumbar Four-Five Left  . removal of wisdom teeth  1977    I have reviewed the social history and family history with the patient and they are unchanged from previous note.  ALLERGIES:  has No Known Allergies.  MEDICATIONS:  Current Outpatient Medications  Medication Sig Dispense Refill  . Accu-Chek FastClix Lancets MISC daily.  for testing    . aspirin EC 81 MG tablet Take 1 tablet (81 mg total) by mouth daily after breakfast. Resume 4 days post-op    . Ginger, Zingiber officinalis, (GINGER  ROOT PO) Take 750 mg by mouth.    Marland Kitchen HYDROcodone-acetaminophen (NORCO) 5-325 MG tablet Take 1 tablet by mouth every 6 (six) hours as needed for moderate pain. 30 tablet 0  . KLOR-CON M20 20 MEQ tablet TAKE 1 TABLET (20 MEQ TOTAL) BY MOUTH EVERY OTHER DAY. 30 tablet 0  . lidocaine-prilocaine (EMLA) cream Apply to affected area once 30 g 3  . lisinopril (PRINIVIL,ZESTRIL) 10 MG tablet Take 10 mg by mouth daily after breakfast.    . metFORMIN (GLUCOPHAGE) 1000 MG tablet TAKE 1 TABLET BY MOUTH AT BREAKFAST AND 1/2 TABLET AT SUPPER    . ondansetron (ZOFRAN) 8 MG tablet Take 1 tablet (8 mg total) by mouth 2 (two) times daily as needed for refractory nausea / vomiting. Start on day 3 after chemotherapy. 30 tablet 1  . pantoprazole (PROTONIX) 40 MG tablet Take 1 tablet (40 mg total) by mouth daily. 30 tablet 0  . prochlorperazine (COMPAZINE) 10 MG tablet TAKE 1 TABLET (10 MG TOTAL) BY MOUTH EVERY 6 (SIX) HOURS AS NEEDED (NAUSEA). 30 tablet 1  . simvastatin (ZOCOR) 40 MG tablet Take 20 mg by mouth every evening.    . traMADol (ULTRAM) 50 MG tablet Take 2 tablets (100 mg total) by mouth every 6 (six) hours as needed. 60 tablet 0   No current facility-administered medications for this visit.    Facility-Administered Medications Ordered in Other Visits  Medication Dose Route Frequency Provider Last Rate Last Dose  . fluorouracil (ADRUCIL) 5,850 mg in sodium chloride 0.9 % 133 mL chemo infusion  2,400 mg/m2 (Treatment Plan Recorded) Intravenous 1 day or 1 dose Truitt Merle, MD   5,850 mg at 02/26/19 1449    PHYSICAL EXAMINATION: ECOG PERFORMANCE STATUS: 1 - Symptomatic but completely ambulatory  Vitals:   02/26/19 0851  BP: 101/80  Pulse: 96  Resp: 18  Temp: 97.8 F (36.6 C)  SpO2: 99%   Filed Weights   02/26/19 0851  Weight: 211 lb 3.2 oz (95.8 kg)    GENERAL:alert, no distress and comfortable SKIN: skin color, texture, turgor are normal, no rashes or significant lesions EYES: normal,  Conjunctiva are pink and non-injected, sclera clear  NECK: supple, thyroid normal size, non-tender, without nodularity LYMPH:  no palpable lymphadenopathy in the cervical, axillary  LUNGS: clear to auscultation and percussion with normal breathing effort HEART: regular rate & rhythm and no murmurs and no lower extremity edema ABDOMEN:abdomen soft, non-tender and normal bowel sounds Musculoskeletal:no cyanosis of digits and no clubbing  NEURO: alert & oriented x 3 with fluent speech, no focal motor/sensory deficits  LABORATORY DATA:  I have reviewed the data as listed CBC Latest Ref Rng & Units 02/26/2019 02/12/2019 01/22/2019  WBC 4.0 - 10.5 K/uL 5.1 5.7 6.5  Hemoglobin 13.0 - 17.0 g/dL 10.7(L) 11.3(L) 11.5(L)  Hematocrit 39.0 - 52.0 % 33.1(L) 35.0(L) 34.8(L)  Platelets 150 - 400 K/uL 121(L) 177 145(L)     CMP Latest Ref Rng & Units 02/26/2019 02/12/2019 01/22/2019  Glucose 70 - 99 mg/dL 142(H) 150(H) 140(H)  BUN 8 - 23 mg/dL '10 10 12  ' Creatinine 0.61 - 1.24 mg/dL 0.82 0.84 0.79  Sodium 135 - 145 mmol/L 142 142 143  Potassium 3.5 - 5.1 mmol/L 3.7 4.0 3.4(L)  Chloride 98 - 111 mmol/L 107 108  107  CO2 22 - 32 mmol/L '25 24 25  ' Calcium 8.9 - 10.3 mg/dL 9.1 8.8(L) 8.5(L)  Total Protein 6.5 - 8.1 g/dL 6.4(L) 6.5 6.3(L)  Total Bilirubin 0.3 - 1.2 mg/dL 0.5 0.5 0.4  Alkaline Phos 38 - 126 U/L 109 98 113  AST 15 - 41 U/L 14(L) 21 19  ALT 0 - 44 U/L 16 24 32      RADIOGRAPHIC STUDIES: I have personally reviewed the radiological images as listed and agreed with the findings in the report. No results found.   ASSESSMENT & PLAN:  Vincent [name] is a 68 y.o. male with   1. Adenocarcinoma oftail of Pancreas, cT3N0Mx, with liver metastasis -He was recently diagnosedin3/2020.  -His CT APand abdominal MRI showed a 1.6cm lesion in thedorm, although it appears likely a benign cyst on the CT scan, however the MRI features are concerning for livermetastasis. -I previously discussed to  confirm with definitive prognosis we can do liver biopsy. Given thetypical scan findings and growth over time, the liver lesions are likely metastatic lesions. Given the current COVID-19,I will probablyskip biopsy. Heagreed. -I previously discussed if metastatic disease, he is no longer a surgical candidate and his cancer is no longer curable. Goal of therapy would be palliative toprolong his life and improve his quality of life. He understands. -He started first-line FOLFIRINOX every 2 weeks on 11/28/18. He is tolerating welloverallwith fatigue andfluctuatingappetite and cold sensitivity, intermittent neuropathy s/p C6 and mild diarrhea. Will watch for neuropathy. -He has had 2 episodes of dizziness and syncope upon standing with cycle 4. I added IV fluids to pump d/c  -S/p cycle 5 CT AP from 02/11/19 shows Overall mixed response/stable disease. I previously discussed we can continue current treatment, but it is possible his disease will truly progress on this regimen in near future and we will change to second line chemo Abraxane and Gemcitabine. He understands.  -He is tolerating current regimen well, and performance status has improved.  -Labs reviewed, CBC and CMP [facility] except Hg 10.7, PLT 121K, BG 142. Last Ca 19.9 has decreased significantly to 3650. Overall adequate to proceed with FOLFIRINOX today  -I reviewed chemo precautions with him. I discussed during first week of chemo he should definitely use condoms with intercourse. During week off he should be fine to not use condom. He understands.  -He would like to have another small break for his anniversary in early August. Will arrange.  -F/u in 3 weeks   2. Epigastricand LUQpainradiating to his back -secondary topancreaticcancer -Ipreviouslyprescribed him NORCObut does not feel it helps as much as Tramadol. I advised him not to go over 8 tabs Tramadol in a day. -Abdominal painhas revolvedsince he started chemo, back and  muscle pain manageable with tramadol and tylenol.He is not taking norco or oxycodone.Stable.I refilled Tramadol today (02/26/19)  3. Lower appetite, Weight loss  -Eating exacerbates his epigastric pain and nausea -He has lost 40 poundswhen he was diagnosed -He drinks protein shakeslike Glucerna, supplements, and flavored water. -Continue tofollow up with Dietician -Appetite has been adequate. Due to mild diarrhea of week 1 chemo he has lost a few pounds lately.  -I encouraged him to gain weight back during weeks off chemo. He is agreeable.   4. DM, HTN  -On Metformin and Lisinopril -We will monitor his blood pressure and blood glucose closely during the chemotherapy. May adjustmedication during chemoif needed -Continue to f/u with PCP -I encouraged him to watch his BP when he has dizziness.  If he does feel dizzy can should increase water intake and hold Lisinopril. Otherwise he can take half tablets.  -BP101/80today(02/26/19).  5. Hypokalemia  -will continue toincrease potassium in his diet and continue oral potassium -K at 3.7 today (02/26/19).   6. 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  -Will discuss results with Santiago Glad    PLAN: -I refilled Tramadol today  -labs reviewed and adequate to proceed with C83mOLFIRINOX todaywith Udenyca on day 3 -lab, flush,f/uand chemo in 3 weeks, due to his vacation plan     No problem-specific Assessment & Plan notes found for this encounter.   No orders of the defined types were placed in this encounter.  All questions were answered. The patient knows to call the clinic with any problems, questions or concerns. No barriers to learning was detected. I spent 20 minutes counseling the patient face to face. The total time spent in the appointment was 25 minutes and more than 50% was on  counseling and review of test results     YTruitt Merle MD 02/26/2019   I, AJoslyn Devon am acting as scribe for YTruitt Merle MD.   I have reviewed the above documentation for accuracy and completeness, and I agree with the above.

## 2019-02-26 NOTE — Patient Instructions (Signed)
Coronavirus (COVID-19) Are you at risk?  Are you at risk for the Coronavirus (COVID-19)?  To be considered HIGH RISK for Coronavirus (COVID-19), you have to meet the following criteria:  . Traveled to [place], [place], [place], [place] or [place]; or in the [place] to [place], [place], [place], or [place]; and have fever, cough, and shortness of breath within the last 2 weeks of travel OR . Been in close contact with a person diagnosed with COVID-19 within the last 2 weeks and have fever, cough, and shortness of breath . IF YOU DO NOT MEET THESE CRITERIA, YOU ARE CONSIDERED LOW RISK FOR COVID-19.  What to do if you are HIGH RISK for COVID-19?  . If you are having a medical emergency, call 911. . Seek medical care right away. Before you go to a doctor's office, urgent care or emergency department, call ahead and tell them about your recent travel, contact with someone diagnosed with COVID-19, and your symptoms. You should receive instructions from your physician's office regarding next steps of care.  . When you arrive at healthcare provider, tell the healthcare staff immediately you have returned from visiting [place], [place], [place], [place] or [place]; or traveled in the [place] to [place], [place], [place], or [place]; in the last two weeks or you have been in close contact with a person diagnosed with COVID-19 in the last 2 weeks.   . Tell the health care staff about your symptoms: fever, cough and shortness of breath. . After you have been seen by a medical provider, you will be either: o Tested for (COVID-19) and discharged home on quarantine except to seek medical care if symptoms worsen, and asked to  - Stay home and avoid contact with others until you get your results (4-5 days)  - Avoid travel on public transportation if possible (such as bus, train, or airplane) or o Sent to the Emergency Department by EMS for evaluation, COVID-19 testing, and possible  admission depending on your condition and test results.  What to do if you are LOW RISK for COVID-19?  Reduce your risk of any infection by using the same precautions used for avoiding the common cold or flu:  . Wash your hands often with soap and warm water for at least 20 seconds.  If soap and water are not readily available, use an alcohol-based hand sanitizer with at least 60% alcohol.  . If coughing or sneezing, cover your mouth and nose by coughing or sneezing into the elbow areas of your shirt or coat, into a tissue or into your sleeve (not your hands). . Avoid shaking hands with others and consider head nods or verbal greetings only. . Avoid touching your eyes, nose, or mouth with unwashed hands.  . Avoid close contact with people who are sick. . Avoid places or events with large numbers of people in one location, like concerts or sporting events. . Carefully consider travel plans you have or are making. . If you are planning any travel outside or inside the [place], visit the CDC's Travelers' Health webpage for the latest health notices. . If you have some symptoms but not all symptoms, continue to monitor at home and seek medical attention if your symptoms worsen. . If you are having a medical emergency, call 911.   ADDITIONAL HEALTHCARE OPTIONS FOR PATIENTS  [facility] / e-Visit: [email or web address]         [facility]: [phone]  Onset   Urgent Care: [facility]: [facility] Discharge Instructions for Patients Receiving Chemotherapy  Today you received the following chemotherapy agents Oxaliplatin, Leucovorin, Irinotecan and Adrucil   To help prevent nausea and vomiting after your treatment, we encourage you to take your nausea medication as directed.    If you develop nausea and vomiting that is not controlled by your nausea  medication, call the clinic.   BELOW ARE SYMPTOMS THAT SHOULD BE REPORTED IMMEDIATELY:  *FEVER GREATER THAN 100.5 F  *CHILLS WITH OR WITHOUT FEVER  NAUSEA AND VOMITING THAT IS NOT CONTROLLED WITH YOUR NAUSEA MEDICATION  *UNUSUAL SHORTNESS OF BREATH  *UNUSUAL BRUISING OR BLEEDING  TENDERNESS IN MOUTH AND THROAT WITH OR WITHOUT PRESENCE OF ULCERS  *URINARY PROBLEMS  *BOWEL PROBLEMS  UNUSUAL RASH Items with * indicate a potential emergency and should be followed up as soon as possible.  Feel free to call the clinic should you have any questions or concerns. The clinic phone number is [phone].  Please show the [place] at check-in to the Emergency Department and triage nurse.

## 2019-02-26 NOTE — Telephone Encounter (Signed)
Scheduled appt per 7/22 los. °

## 2019-02-26 NOTE — Progress Notes (Signed)
Nutrition follow-up completed with patient during infusion for pancreas cancer. Weight decreased and documented as 211.2 pounds on July 22 Noted BMI 25.71. Glucose 142. Reports he has a good appetite and he feels like he is eating okay. He continues to drink Glucerna.  Nutrition diagnosis: Unintentional weight loss continues.  Intervention: Patient educated to increase oral intake 3 meals +3 snacks.  Encouraged high-calorie foods. Recommended Ensure Enlive or increasing Glucerna 3 times daily between meals. Offered coupons however patient politely declined.  Monitoring, evaluation, goals: Patient will work to increase oral intake to minimize weight loss.  Next visit: To be scheduled as needed.

## 2019-02-28 ENCOUNTER — Other Ambulatory Visit: Payer: Self-pay

## 2019-02-28 ENCOUNTER — Inpatient Hospital Stay: Payer: BC Managed Care – PPO

## 2019-02-28 VITALS — BP 128/87 | HR 72 | Temp 99.1°F | Resp 18

## 2019-02-28 DIAGNOSIS — C252 Malignant neoplasm of tail of pancreas: Secondary | ICD-10-CM | POA: Diagnosis not present

## 2019-02-28 DIAGNOSIS — Z7189 Other specified counseling: Secondary | ICD-10-CM

## 2019-02-28 MED ORDER — PEGFILGRASTIM-CBQV 6 MG/0.6ML ~~LOC~~ SOSY
6.0000 mg | PREFILLED_SYRINGE | Freq: Once | SUBCUTANEOUS | Status: AC
  Administered 2019-02-28: 6 mg via SUBCUTANEOUS

## 2019-02-28 MED ORDER — SODIUM CHLORIDE 0.9% FLUSH
10.0000 mL | INTRAVENOUS | Status: DC | PRN
  Administered 2019-02-28: 10 mL
  Filled 2019-02-28: qty 10

## 2019-02-28 MED ORDER — PEGFILGRASTIM-CBQV 6 MG/0.6ML ~~LOC~~ SOSY
PREFILLED_SYRINGE | SUBCUTANEOUS | Status: AC
  Filled 2019-02-28: qty 0.6

## 2019-02-28 MED ORDER — HEPARIN SOD (PORK) LOCK FLUSH 100 UNIT/ML IV SOLN
500.0000 [IU] | Freq: Once | INTRAVENOUS | Status: AC | PRN
  Administered 2019-02-28: 13:00:00 500 [IU]
  Filled 2019-02-28: qty 5

## 2019-03-08 ENCOUNTER — Other Ambulatory Visit: Payer: Self-pay | Admitting: Hematology

## 2019-03-08 DIAGNOSIS — C252 Malignant neoplasm of tail of pancreas: Secondary | ICD-10-CM

## 2019-03-08 DIAGNOSIS — Z7189 Other specified counseling: Secondary | ICD-10-CM

## 2019-03-12 ENCOUNTER — Ambulatory Visit: Payer: BC Managed Care – PPO

## 2019-03-12 ENCOUNTER — Other Ambulatory Visit: Payer: BC Managed Care – PPO

## 2019-03-12 ENCOUNTER — Ambulatory Visit: Payer: BC Managed Care – PPO | Admitting: Hematology

## 2019-03-17 NOTE — Progress Notes (Signed)
[name]   Telephone:([phone] Fax:(336) [phone]   Clinic Follow up Note   Patient Care Team: Leonard Downing, MD as PCP - General (Family Medicine)  Date of Service:  03/19/2019  CHIEF COMPLAINT: F/u of Pancreatic Cancer   SUMMARY OF ONCOLOGIC HISTORY: Oncology History Overview Note  Cancer Staging Pancreatic cancer [facility]) Staging form: Exocrine Pancreas, AJCC 8th Edition - Clinical stage from 10/29/2018: Stage IV (cT3, cN0, cM1) - Signed by Truitt Merle, MD on 11/05/2018     Pancreatic cancer ([place])  10/09/2018 Procedure   Upper Endoscopy by. Dr Paulita Fujita 10/29/18 IMPRESSION - There was no evidence of significant pathology in the left lobe of the liver. - There was no sign of significant pathology in the gallbladder. - Many abnormal lymph nodes were visualized in the splenic region (level 19), perigastric region and peripancreatic region. - A mass was identified in the pancreatic tail. This was staged T4 N2 Mx by endosonographic criteria. Fine needle aspiration performed.   10/10/2018 Imaging   CT Abdomen 10/10/18 IMPRESSION: 1. Uncomplicated acute diverticulitis of the proximal sigmoid colon. 2. Ill-defined low-density in the pancreatic tail measuring at least 4.7 cm, with possible punctate peripheral calcifications. This is suspicious for pancreatic neoplasm, however sequela of prior pancreatitis is also considered. Recommend further characterization with pancreatic protocol MRI when patient is able to tolerate breath hold technique. 3. 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 4. 16 mm cyst in the left lobe of the liver. Aortic Atherosclerosis (ICD10-I70.0).   10/16/2018 Imaging   MRI Abdomen at [facility] 10/16/18 IMPRESSION: 1. Mass in the tail the pancreas concerning for neoplasm. This contacts the spleen in the splenic hilum. 2. There is stranding in the fat extending from the tumor to  the celiac axis and left adrenal gland. Cannot exclude infiltrating tumor. 3. 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 4. metastatic lesion in the liver   10/29/2018 Initial Biopsy   Diagnosis 10/29/18 FINE NEEDLE ASPIRATION, ENDOSCOPIC, PANCREAS TAIL(SPECIMEN 1 OF 1 COLLECTED 10/29/18): MALIGNANT CELLS PRESENT, CONSISTENT WITH ADENOCARCINOMA. SEE COMMENT. COMMENT: THERE IS INSUFFICIENT TUMOR PRESENT FOR ADDITIONAL STUDIES.   10/29/2018 Cancer Staging   Staging form: Exocrine Pancreas, AJCC 8th Edition - Clinical stage from 10/29/2018: Stage IV (cT3, cN0, cM1) - Signed by Truitt Merle, MD on 11/05/2018   11/04/2018 Initial Diagnosis   Pancreatic cancer ([name])   11/27/2018 Imaging   CT Chest  IMPRESSION: 1.  No acute process or evidence of metastatic disease in the chest. 2. Locally advanced pancreatic carcinoma, as detailed previously. Cannot exclude superimposed pancreatitis. 3. New and enlarging liver lesions, suspicious for hepatic metastasis. 4. Aortic atherosclerosis (ICD10-I70.0), coronary artery atherosclerosis and emphysema (ICD10-J43.9).   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  The Common Hereditary Gene Panel offered by [facility] includes sequencing and/or deletion duplication testing of the following 48 genes: APC, ATM, AXIN2, BARD1, BMPR1A, BRCA1, BRCA2, BRIP1, CDH1, CDK4, CDKN2A (p14ARF), CDKN2A (p16INK4a), CHEK2, CTNNA1, DICER1, EPCAM (Deletion/duplication testing only), GREM1 (promoter region deletion/duplication testing only), KIT, MEN1, MLH1, MSH2, MSH3, MSH6, MUTYH, NBN, NF1, NHTL1, PALB2, PDGFRA, PMS2, POLD1, POLE, PTEN, RAD50, RAD51C, RAD51D, RNF43, SDHB, SDHC, SDHD, SMAD4, SMARCA4. STK11, TP53, TSC1, TSC2, and VHL.  The following genes were evaluated for sequence  changes only: SDHA and HOXB13 c.251G>A  variant only. The report date is December 04, 2018.,   02/11/2019 Imaging   CT AP W Contrast  IMPRESSION: 1. Stable appearing ill-defined pancreatic mass in the tail region extending into the splenic hilum. 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 2. The left hepatic lobe metastatic lesion is slightly larger and the right hepatic lobe lesion is slightly smaller. No new lesions are identified. 3. Small peripancreatic lymph nodes are stable. No retroperitoneal lymphadenopathy. 4. No worrisome pulmonary nodules at the lung bases and no worrisome bone lesions.      CURRENT THERAPY:  First lineFOLFIRINOX q2weeks starting4/23/20  INTERVAL HISTORY:  Vincent [name] is here for a follow up and treatment. He presents to the clinic alone. He notes he is doing well. He likes the extras week between treatment. He notes after 4-5 days he starts to improve and will recover after a week. He is able to eat adequately and has normal BM and urine, no blood. He notes mild coldness of feet. He also has mild cold sensitivity of his lips. He notes nail color change. He denies issues with Udenyca. He notes he has been drinking adequately and has not more dizziness.  He plans to be on vacation 23-30 of August.     REVIEW OF SYSTEMS:   Constitutional: Denies fevers, chills or abnormal weight loss Eyes: Denies blurriness of vision Ears, nose, mouth, throat, and face: Denies mucositis or sore throat Respiratory: Denies cough, dyspnea or wheezes Cardiovascular: Denies palpitation, chest discomfort or lower extremity swelling Gastrointestinal:  Denies nausea, heartburn or change in bowel habits Skin: Denies abnormal skin rashes (+) left nail color change Lymphatics: Denies new lymphadenopathy or easy bruising Neurological:Denies numbness, tingling or new weaknesses (+) manageable cold sensitivity  Behavioral/Psych:  Mood is stable, no new changes  All other systems were reviewed with the patient and are negative.  MEDICAL HISTORY:  Past Medical History:  Diagnosis Date   Diabetes mellitus without complication ([place])    pt. states that he is not diabeticalthought he takes metformin   Eye hemorrhage    right   Family history of lung cancer    Hypertension    Monoallelic mutation of BRIP1 gene    Monoallelic mutation of MUTYH gene     SURGICAL HISTORY: Past Surgical History:  Procedure Laterality Date   ESOPHAGOGASTRODUODENOSCOPY (EGD) WITH PROPOFOL N/A 10/29/2018   Procedure: ESOPHAGOGASTRODUODENOSCOPY (EGD) WITH PROPOFOL;  Surgeon: Arta Silence, MD;  Location: [facility];  Service: Endoscopy;  Laterality: N/A;   EUS N/A 10/29/2018   Procedure: FULL UPPER ENDOSCOPIC ULTRASOUND (EUS) RADIAL;  Surgeon: Arta Silence, MD;  Location: [facility];  Service: Endoscopy;  Laterality: N/A;   FINE NEEDLE ASPIRATION N/A 10/29/2018   Procedure: FINE NEEDLE ASPIRATION (FNA) LINEAR;  Surgeon: Arta Silence, MD;  Location: [facility];  Service: Endoscopy;  Laterality: N/A;   IR IMAGING GUIDED PORT INSERTION  11/11/2018   LUMBAR LAMINECTOMY/DECOMPRESSION MICRODISCECTOMY Left 10/11/2017   Procedure: Microlumbar decompression Lumbar Four-Five Left;  Surgeon: Susa Day, MD;  Location: [facility];  Service: Orthopedics;  Laterality: Left;  Microlumbar decompression Lumbar Four-Five Left   removal of wisdom teeth  1977    I have reviewed the social history and family history with the patient and they are unchanged from previous note.  ALLERGIES:  has No Known Allergies.  MEDICATIONS:  Current Outpatient Medications  Medication Sig Dispense Refill   Accu-Chek FastClix Lancets MISC daily. for testing     aspirin EC  81 MG tablet Take 1 tablet (81 mg total) by mouth daily after breakfast. Resume 4 days post-op     Ginger, Zingiber officinalis, (GINGER ROOT PO) Take 750 mg by mouth.     KLOR-CON  M20 20 MEQ tablet TAKE 1 TABLET (20 MEQ TOTAL) BY MOUTH EVERY OTHER DAY. 30 tablet 0   lidocaine-prilocaine (EMLA) cream Apply to affected area once 30 g 3   lisinopril (PRINIVIL,ZESTRIL) 10 MG tablet Take 10 mg by mouth daily after breakfast.     metFORMIN (GLUCOPHAGE) 1000 MG tablet TAKE 1 TABLET BY MOUTH AT BREAKFAST AND 1/2 TABLET AT SUPPER     ondansetron (ZOFRAN) 8 MG tablet Take 1 tablet (8 mg total) by mouth 2 (two) times daily as needed for refractory nausea / vomiting. Start on day 3 after chemotherapy. 30 tablet 1   prochlorperazine (COMPAZINE) 10 MG tablet TAKE 1 TABLET (10 MG TOTAL) BY MOUTH EVERY 6 (SIX) HOURS AS NEEDED (NAUSEA). 30 tablet 1   traMADol (ULTRAM) 50 MG tablet Take 2 tablets (100 mg total) by mouth every 6 (six) hours as needed. 60 tablet 0   simvastatin (ZOCOR) 40 MG tablet Take 20 mg by mouth every evening.     No current facility-administered medications for this visit.     PHYSICAL EXAMINATION: ECOG PERFORMANCE STATUS: 1 - Symptomatic but completely ambulatory  Vitals:   03/19/19 1012  BP: 109/81  Pulse: 92  Resp: 18  Temp: 97.8 F (36.6 C)  SpO2: 98%   Filed Weights   03/19/19 1012  Weight: 207 lb 4.8 oz (94 kg)    GENERAL:alert, no distress and comfortable SKIN: skin color, texture, turgor are normal, no rashes or significant lesions EYES: normal, Conjunctiva are pink and non-injected, sclera clear  NECK: supple, thyroid normal size, non-tender, without nodularity LYMPH:  no palpable lymphadenopathy in the cervical, axillary  LUNGS: clear to auscultation and percussion with normal breathing effort HEART: regular rate & rhythm and no murmurs and no lower extremity edema ABDOMEN:abdomen soft, non-tender and normal bowel sounds Musculoskeletal:no cyanosis of digits and no clubbing  NEURO: alert & oriented x 3 with fluent speech, no focal motor/sensory deficits  LABORATORY DATA:  I have reviewed the data as listed CBC Latest Ref Rng &  Units 03/19/2019 02/26/2019 02/12/2019  WBC 4.0 - 10.5 K/uL 6.1 5.1 5.7  Hemoglobin 13.0 - 17.0 g/dL 11.5(L) 10.7(L) 11.3(L)  Hematocrit 39.0 - 52.0 % 35.7(L) 33.1(L) 35.0(L)  Platelets 150 - 400 K/uL 150 121(L) 177     CMP Latest Ref Rng & Units 02/26/2019 02/12/2019 01/22/2019  Glucose 70 - 99 mg/dL 142(H) 150(H) 140(H)  BUN 8 - 23 mg/dL _0 Creatinine 0.61 - 1.24 mg/dL 0.82 0.84 0.79  Sodium 135 - 145 mmol/L 142 142 143  Potassium 3.5 - 5.1 mmol/L 3.7 4.0 3.4(L)  Chloride 98 - 111 mmol/L 107 108 107  CO2 22 - 32 mmol/L _1 Calcium 8.9 - 10.3 mg/dL 9.1 8.8(L) 8.5(L)  Total Protein 6.5 - 8.1 g/dL 6.4(L) 6.5 6.3(L)  Total Bilirubin 0.3 - 1.2 mg/dL 0.5 0.5 0.4  Alkaline Phos 38 - 126 U/L 109 98 113  AST 15 - 41 U/L 14(L) 21 19  ALT 0 - 44 U/L 16 24 32      RADIOGRAPHIC STUDIES: I have personally reviewed the radiological images as listed and agreed with the findings in the report. No results found.   ASSESSMENT & PLAN:  Peder Allums is a 68  y.o. male with   1. Adenocarcinoma oftail of Pancreas, cT3N0Mx, with liver metastasis -He was recently diagnosedin3/2020.  -His CT APand abdominal MRI showed a 1.6cm lesion in thedorm, althoughit appears likely a benign cyst on the CT scan, however the MRI features are concerning for livermetastasis. -I previously discussed to confirm with definitive prognosis we can do liver biopsy. Given thetypical scan findings and growth over time, the liver lesions are likely metastatic lesions. Given the current COVID-19,I will probablyskip biopsy. Heagreed. -I previously discussed if metastatic disease, he is no longer a surgical candidate and his cancer is no longer curable. Goal of therapy would be palliative toprolong his life and improve his quality of life. He understands. -He started first-line FOLFIRINOX every 2 weeks on 11/28/18. Heistolerating welloverallwith fatigue andfluctuatingappetite and cold sensitivity,  intermittent neuropathy s/p C6 and mild diarrhea.   -He has had 2 episodes of dizziness and syncope upon standing with cycle 4. I added IV fluids to pump d/c -S/p cycle 5 CT AP from 02/11/19 shows Overall mixed response/stable disease. I previously discussed we can continue current treatment, but it is possible his disease will truly progress on this regimen in near future and we will change tosecond linechemo Abraxane and Gemcitabine. He understands.  -He is tolerating current regimen well, and performance status has improved. -He is clinically stable. Labs reviewed, CBC WNL except Hg 11.5. CMP and CA 19.9 still pending. Overall adequate to proceed with FOLFIRINOX today.  -F/u in 3 weeks given his vacation in 2 weeks.  -will return to every 2 weeks schedule after next cycle with dose reduction to make it more tolerable  -repeat scan in late Sep    2. Epigastricand LUQpainradiating to his back -secondary topancreaticcancer -Ipreviouslyprescribed him NORCObut does not feel it helps as much as Tramadol. I advised him not to go over 8 tabs Tramadol in a day. -Abdominal painhas revolvedsince he started chemo, back and muscle pain manageable with tramadol and tylenol.He is not taking norco or oxycodone. -Stable   3. Lower appetite, Weight loss  -Eating exacerbates his epigastric pain and nausea -He was initially at 230 pounds at baseline -Continue tofollow up with Dietician -Appetite has been adequate. He has lost a few pounds, but overall stable.  -I recommend he increase his protein and calorie intake. He can also continue Glucerna.   4. DM, HTN  -On Metformin and Lisinopril -We will monitor his blood pressure and blood glucose closely during the chemotherapy. May adjustmedication during chemoif needed -I advised him to avoid sweet during the first 3-4 of each cycle given steroid premeds.  -Continue to f/u with PCP -with increased water intake his dizziness has  resolved.  -BP109/81today(03/19/19).  5. Hypokalemia  -will continue toincrease potassium in his dietand continue oral potassium   6. 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     PLAN: -labs reviewed and adequate to proceed with C5mOLFIRINOX todaywith Udenyca on day 3 -lab, flush,f/uand chemo in3 weeks, due to his vacation plan    No problem-specific Assessment & Plan notes found for this encounter.   No orders of the defined types were placed in this encounter.  All questions were answered. The patient knows to call the clinic with any problems, questions or concerns. No barriers to learning was detected. I spent 15 minutes counseling the patient face to face. The total time spent in the appointment was 20 minutes and more  than 50% was on counseling and review of test results     Truitt Merle, MD 03/19/2019   I, Joslyn Devon, am acting as scribe for Truitt Merle, MD.   I have reviewed the above documentation for accuracy and completeness, and I agree with the above.

## 2019-03-18 ENCOUNTER — Other Ambulatory Visit: Payer: Self-pay | Admitting: Hematology

## 2019-03-19 ENCOUNTER — Inpatient Hospital Stay: Payer: BC Managed Care – PPO | Attending: Hematology

## 2019-03-19 ENCOUNTER — Encounter: Payer: Self-pay | Admitting: Hematology

## 2019-03-19 ENCOUNTER — Other Ambulatory Visit: Payer: Self-pay

## 2019-03-19 ENCOUNTER — Inpatient Hospital Stay: Payer: BC Managed Care – PPO

## 2019-03-19 ENCOUNTER — Inpatient Hospital Stay (HOSPITAL_BASED_OUTPATIENT_CLINIC_OR_DEPARTMENT_OTHER): Payer: BC Managed Care – PPO | Admitting: Hematology

## 2019-03-19 VITALS — BP 109/81 | HR 92 | Temp 97.8°F | Resp 18 | Ht 76.0 in | Wt 207.3 lb

## 2019-03-19 DIAGNOSIS — R110 Nausea: Secondary | ICD-10-CM | POA: Insufficient documentation

## 2019-03-19 DIAGNOSIS — Z7189 Other specified counseling: Secondary | ICD-10-CM

## 2019-03-19 DIAGNOSIS — R1012 Left upper quadrant pain: Secondary | ICD-10-CM | POA: Diagnosis not present

## 2019-03-19 DIAGNOSIS — R634 Abnormal weight loss: Secondary | ICD-10-CM | POA: Insufficient documentation

## 2019-03-19 DIAGNOSIS — C787 Secondary malignant neoplasm of liver and intrahepatic bile duct: Secondary | ICD-10-CM | POA: Diagnosis not present

## 2019-03-19 DIAGNOSIS — Z95828 Presence of other vascular implants and grafts: Secondary | ICD-10-CM

## 2019-03-19 DIAGNOSIS — E1142 Type 2 diabetes mellitus with diabetic polyneuropathy: Secondary | ICD-10-CM | POA: Diagnosis not present

## 2019-03-19 DIAGNOSIS — C250 Malignant neoplasm of head of pancreas: Secondary | ICD-10-CM

## 2019-03-19 DIAGNOSIS — I10 Essential (primary) hypertension: Secondary | ICD-10-CM | POA: Insufficient documentation

## 2019-03-19 DIAGNOSIS — E876 Hypokalemia: Secondary | ICD-10-CM | POA: Insufficient documentation

## 2019-03-19 DIAGNOSIS — C252 Malignant neoplasm of tail of pancreas: Secondary | ICD-10-CM

## 2019-03-19 DIAGNOSIS — R1013 Epigastric pain: Secondary | ICD-10-CM | POA: Diagnosis not present

## 2019-03-19 DIAGNOSIS — M791 Myalgia, unspecified site: Secondary | ICD-10-CM | POA: Diagnosis not present

## 2019-03-19 DIAGNOSIS — Z79899 Other long term (current) drug therapy: Secondary | ICD-10-CM | POA: Diagnosis not present

## 2019-03-19 DIAGNOSIS — Z7689 Persons encountering health services in other specified circumstances: Secondary | ICD-10-CM | POA: Diagnosis not present

## 2019-03-19 DIAGNOSIS — Z5111 Encounter for antineoplastic chemotherapy: Secondary | ICD-10-CM | POA: Insufficient documentation

## 2019-03-19 DIAGNOSIS — Z7984 Long term (current) use of oral hypoglycemic drugs: Secondary | ICD-10-CM | POA: Diagnosis not present

## 2019-03-19 LAB — CBC WITH DIFFERENTIAL (CANCER CENTER ONLY)
Abs Immature Granulocytes: 0.03 10*3/uL (ref 0.00–0.07)
Basophils Absolute: 0.1 10*3/uL (ref 0.0–0.1)
Basophils Relative: 2 %
Eosinophils Absolute: 0.1 10*3/uL (ref 0.0–0.5)
Eosinophils Relative: 2 %
HCT: 35.7 % — ABNORMAL LOW (ref 39.0–52.0)
Hemoglobin: 11.5 g/dL — ABNORMAL LOW (ref 13.0–17.0)
Immature Granulocytes: 1 %
Lymphocytes Relative: 18 %
Lymphs Abs: 1.1 10*3/uL (ref 0.7–4.0)
MCH: 35.6 pg — ABNORMAL HIGH (ref 26.0–34.0)
MCHC: 32.2 g/dL (ref 30.0–36.0)
MCV: 110.5 fL — ABNORMAL HIGH (ref 80.0–100.0)
Monocytes Absolute: 0.7 10*3/uL (ref 0.1–1.0)
Monocytes Relative: 11 %
Neutro Abs: 4.1 10*3/uL (ref 1.7–7.7)
Neutrophils Relative %: 66 %
Platelet Count: 150 10*3/uL (ref 150–400)
RBC: 3.23 MIL/uL — ABNORMAL LOW (ref 4.22–5.81)
RDW: 14.4 % (ref 11.5–15.5)
WBC Count: 6.1 10*3/uL (ref 4.0–10.5)
nRBC: 0 % (ref 0.0–0.2)

## 2019-03-19 LAB — CMP (CANCER CENTER ONLY)
ALT: 26 U/L (ref 0–44)
AST: 19 U/L (ref 15–41)
Albumin: 3.7 g/dL (ref 3.5–5.0)
Alkaline Phosphatase: 101 U/L (ref 38–126)
Anion gap: 10 (ref 5–15)
BUN: 11 mg/dL (ref 8–23)
CO2: 26 mmol/L (ref 22–32)
Calcium: 9.3 mg/dL (ref 8.9–10.3)
Chloride: 105 mmol/L (ref 98–111)
Creatinine: 0.91 mg/dL (ref 0.61–1.24)
GFR, Est AFR Am: >60 mL/min (ref >60)
GFR, Estimated: >60 mL/min (ref >60)
Glucose, Bld: 144 mg/dL — ABNORMAL HIGH (ref 70–99)
Potassium: 4.2 mmol/L (ref 3.5–5.1)
Sodium: 141 mmol/L (ref 135–145)
Total Bilirubin: 0.6 mg/dL (ref 0.3–1.2)
Total Protein: 6.7 g/dL (ref 6.5–8.1)

## 2019-03-19 MED ORDER — SODIUM CHLORIDE 0.9 % IV SOLN
Freq: Once | INTRAVENOUS | Status: AC
  Administered 2019-03-19: 11:00:00 via INTRAVENOUS
  Filled 2019-03-19: qty 5

## 2019-03-19 MED ORDER — DEXTROSE 5 % IV SOLN
Freq: Once | INTRAVENOUS | Status: AC
  Administered 2019-03-19: 11:00:00 via INTRAVENOUS
  Filled 2019-03-19: qty 250

## 2019-03-19 MED ORDER — ATROPINE SULFATE 0.4 MG/ML IJ SOLN
INTRAMUSCULAR | Status: AC
  Filled 2019-03-19: qty 1

## 2019-03-19 MED ORDER — IRINOTECAN HCL CHEMO INJECTION 100 MG/5ML
150.0000 mg/m2 | Freq: Once | INTRAVENOUS | Status: AC
  Administered 2019-03-19: 360 mg via INTRAVENOUS
  Filled 2019-03-19: qty 15

## 2019-03-19 MED ORDER — PALONOSETRON HCL INJECTION 0.25 MG/5ML
INTRAVENOUS | Status: AC
  Filled 2019-03-19: qty 5

## 2019-03-19 MED ORDER — OXALIPLATIN CHEMO INJECTION 100 MG/20ML
82.0000 mg/m2 | Freq: Once | INTRAVENOUS | Status: AC
  Administered 2019-03-19: 200 mg via INTRAVENOUS
  Filled 2019-03-19: qty 40

## 2019-03-19 MED ORDER — ATROPINE SULFATE 0.4 MG/ML IJ SOLN
0.4000 mg | Freq: Once | INTRAMUSCULAR | Status: AC | PRN
  Administered 2019-03-19: 0.4 mg via INTRAVENOUS

## 2019-03-19 MED ORDER — LEUCOVORIN CALCIUM INJECTION 350 MG
400.0000 mg/m2 | Freq: Once | INTRAVENOUS | Status: AC
  Administered 2019-03-19: 976 mg via INTRAVENOUS
  Filled 2019-03-19: qty 48.8

## 2019-03-19 MED ORDER — SODIUM CHLORIDE 0.9% FLUSH
10.0000 mL | Freq: Once | INTRAVENOUS | Status: AC
  Administered 2019-03-19: 10 mL
  Filled 2019-03-19: qty 10

## 2019-03-19 MED ORDER — PALONOSETRON HCL INJECTION 0.25 MG/5ML
0.2500 mg | Freq: Once | INTRAVENOUS | Status: AC
  Administered 2019-03-19: 0.25 mg via INTRAVENOUS

## 2019-03-19 MED ORDER — SODIUM CHLORIDE 0.9 % IV SOLN
2400.0000 mg/m2 | INTRAVENOUS | Status: DC
  Administered 2019-03-19: 5850 mg via INTRAVENOUS
  Filled 2019-03-19: qty 117

## 2019-03-19 NOTE — Patient Instructions (Signed)
Coronavirus (COVID-19) Are you at risk?  Are you at risk for the Coronavirus (COVID-19)?  To be considered HIGH RISK for Coronavirus (COVID-19), you have to meet the following criteria:  . Traveled to [place], [place], [place], [place] or [place]; or in the [place] to [place], [place], [place], or [place]; and have fever, cough, and shortness of breath within the last 2 weeks of travel OR . Been in close contact with a person diagnosed with COVID-19 within the last 2 weeks and have fever, cough, and shortness of breath . IF YOU DO NOT MEET THESE CRITERIA, YOU ARE CONSIDERED LOW RISK FOR COVID-19.  What to do if you are HIGH RISK for COVID-19?  . If you are having a medical emergency, call 911. . Seek medical care right away. Before you go to a doctor's office, urgent care or emergency department, call ahead and tell them about your recent travel, contact with someone diagnosed with COVID-19, and your symptoms. You should receive instructions from your physician's office regarding next steps of care.  . When you arrive at healthcare provider, tell the healthcare staff immediately you have returned from visiting [place], [place], [place], [place] or [place]; or traveled in the [place] to [place], [place], [place], or [place]; in the last two weeks or you have been in close contact with a person diagnosed with COVID-19 in the last 2 weeks.   . Tell the health care staff about your symptoms: fever, cough and shortness of breath. . After you have been seen by a medical provider, you will be either: o Tested for (COVID-19) and discharged home on quarantine except to seek medical care if symptoms worsen, and asked to  - Stay home and avoid contact with others until you get your results (4-5 days)  - Avoid travel on public transportation if possible (such as bus, train, or airplane) or o Sent to the Emergency Department by EMS for evaluation, COVID-19 testing, and possible  admission depending on your condition and test results.  What to do if you are LOW RISK for COVID-19?  Reduce your risk of any infection by using the same precautions used for avoiding the common cold or flu:  . Wash your hands often with soap and warm water for at least 20 seconds.  If soap and water are not readily available, use an alcohol-based hand sanitizer with at least 60% alcohol.  . If coughing or sneezing, cover your mouth and nose by coughing or sneezing into the elbow areas of your shirt or coat, into a tissue or into your sleeve (not your hands). . Avoid shaking hands with others and consider head nods or verbal greetings only. . Avoid touching your eyes, nose, or mouth with unwashed hands.  . Avoid close contact with people who are sick. . Avoid places or events with large numbers of people in one location, like concerts or sporting events. . Carefully consider travel plans you have or are making. . If you are planning any travel outside or inside the [place], visit the CDC's Travelers' Health webpage for the latest health notices. . If you have some symptoms but not all symptoms, continue to monitor at home and seek medical attention if your symptoms worsen. . If you are having a medical emergency, call 911.   ADDITIONAL HEALTHCARE OPTIONS FOR PATIENTS  [facility] / e-Visit: [email or web address]         [facility]: [phone]  Onset   Urgent Care: [facility]: [facility] Discharge Instructions for Patients Receiving Chemotherapy  Today you received the following chemotherapy agents Oxaliplatin, Leucovorin, Irinotecan and Adrucil   To help prevent nausea and vomiting after your treatment, we encourage you to take your nausea medication as directed.    If you develop nausea and vomiting that is not controlled by your nausea  medication, call the clinic.   BELOW ARE SYMPTOMS THAT SHOULD BE REPORTED IMMEDIATELY:  *FEVER GREATER THAN 100.5 F  *CHILLS WITH OR WITHOUT FEVER  NAUSEA AND VOMITING THAT IS NOT CONTROLLED WITH YOUR NAUSEA MEDICATION  *UNUSUAL SHORTNESS OF BREATH  *UNUSUAL BRUISING OR BLEEDING  TENDERNESS IN MOUTH AND THROAT WITH OR WITHOUT PRESENCE OF ULCERS  *URINARY PROBLEMS  *BOWEL PROBLEMS  UNUSUAL RASH Items with * indicate a potential emergency and should be followed up as soon as possible.  Feel free to call the clinic should you have any questions or concerns. The clinic phone number is [phone].  Please show the [facility] at check-in to the Emergency Department and triage nurse.

## 2019-03-20 ENCOUNTER — Telehealth: Payer: Self-pay | Admitting: Hematology

## 2019-03-20 LAB — CANCER ANTIGEN 19-9: CA 19-9: 2279 U/mL — ABNORMAL HIGH (ref 0–35)

## 2019-03-20 NOTE — Telephone Encounter (Signed)
No los per 8/12. °

## 2019-03-21 ENCOUNTER — Inpatient Hospital Stay: Payer: BC Managed Care – PPO

## 2019-03-21 ENCOUNTER — Other Ambulatory Visit: Payer: Self-pay

## 2019-03-21 ENCOUNTER — Encounter: Payer: Self-pay | Admitting: Hematology

## 2019-03-21 VITALS — BP 108/78 | HR 88 | Temp 98.2°F | Resp 18

## 2019-03-21 DIAGNOSIS — Z5111 Encounter for antineoplastic chemotherapy: Secondary | ICD-10-CM | POA: Diagnosis not present

## 2019-03-21 DIAGNOSIS — Z7189 Other specified counseling: Secondary | ICD-10-CM

## 2019-03-21 DIAGNOSIS — C252 Malignant neoplasm of tail of pancreas: Secondary | ICD-10-CM

## 2019-03-21 MED ORDER — SODIUM CHLORIDE 0.9% FLUSH
10.0000 mL | INTRAVENOUS | Status: DC | PRN
  Administered 2019-03-21: 10 mL
  Filled 2019-03-21: qty 10

## 2019-03-21 MED ORDER — PEGFILGRASTIM-CBQV 6 MG/0.6ML ~~LOC~~ SOSY
6.0000 mg | PREFILLED_SYRINGE | Freq: Once | SUBCUTANEOUS | Status: AC
  Administered 2019-03-21: 6 mg via SUBCUTANEOUS

## 2019-03-21 MED ORDER — HEPARIN SOD (PORK) LOCK FLUSH 100 UNIT/ML IV SOLN
500.0000 [IU] | Freq: Once | INTRAVENOUS | Status: AC | PRN
  Administered 2019-03-21: 15:00:00 500 [IU]
  Filled 2019-03-21: qty 5

## 2019-03-21 MED ORDER — PEGFILGRASTIM-CBQV 6 MG/0.6ML ~~LOC~~ SOSY
PREFILLED_SYRINGE | SUBCUTANEOUS | Status: AC
  Filled 2019-03-21: qty 0.6

## 2019-04-07 NOTE — Progress Notes (Signed)
Vincent [name]   [phone] [phone]) [phone]   Clinic Follow up Note   Patient Care Team: Leonard Downing, MD as PCP - General (Family Medicine)  Date of Service:  04/09/2019  CHIEF COMPLAINT:  F/u of Pancreatic Cancer  SUMMARY OF ONCOLOGIC HISTORY: Oncology History Overview Note  Cancer Staging Pancreatic cancer [facility]) Staging form: Exocrine Pancreas, AJCC 8th Edition - Clinical stage from 10/29/2018: Stage IV (cT3, cN0, cM1) - Signed by Truitt Merle, MD on 11/05/2018     Pancreatic cancer ([place])  10/09/2018 Procedure   Upper Endoscopy by. Dr Paulita Fujita 10/29/18 IMPRESSION - There was no evidence of significant pathology in the left lobe of the liver. - There was no sign of significant pathology in the gallbladder. - Many abnormal lymph nodes were visualized in the splenic region (level 19), perigastric region and peripancreatic region. - A mass was identified in the pancreatic tail. This was staged T4 N2 Mx by endosonographic criteria. Fine needle aspiration performed.   10/10/2018 Imaging   CT Abdomen 10/10/18 IMPRESSION: 1. Uncomplicated acute diverticulitis of the proximal sigmoid colon. 2. Ill-defined low-density in the pancreatic tail measuring at least 4.7 cm, with possible punctate peripheral calcifications. This is suspicious for pancreatic neoplasm, however sequela of prior pancreatitis is also considered. Recommend further characterization with pancreatic protocol MRI when patient is able to tolerate breath hold technique. 3. 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 4. 16 mm cyst in the left lobe of the liver. Aortic Atherosclerosis (ICD10-I70.0).   10/16/2018 Imaging   MRI Abdomen at [facility] 10/16/18 IMPRESSION: 1. Mass in the tail the pancreas concerning for neoplasm. This contacts the spleen in the splenic hilum. 2. There is stranding in the fat extending from the tumor to  the celiac axis and left adrenal gland. Cannot exclude infiltrating tumor. 3. 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 4. metastatic lesion in the liver   10/29/2018 Initial Biopsy   Diagnosis 10/29/18 FINE NEEDLE ASPIRATION, ENDOSCOPIC, PANCREAS TAIL(SPECIMEN 1 OF 1 COLLECTED 10/29/18): MALIGNANT CELLS PRESENT, CONSISTENT WITH ADENOCARCINOMA. SEE COMMENT. COMMENT: THERE IS INSUFFICIENT TUMOR PRESENT FOR ADDITIONAL STUDIES.   10/29/2018 Cancer Staging   Staging form: Exocrine Pancreas, AJCC 8th Edition - Clinical stage from 10/29/2018: Stage IV (cT3, cN0, cM1) - Signed by Truitt Merle, MD on 11/05/2018   11/04/2018 Initial Diagnosis   Pancreatic cancer ([place])   11/27/2018 Imaging   CT Chest  IMPRESSION: 1.  No acute process or evidence of metastatic disease in the chest. 2. Locally advanced pancreatic carcinoma, as detailed previously. Cannot exclude superimposed pancreatitis. 3. New and enlarging liver lesions, suspicious for hepatic metastasis. 4. Aortic atherosclerosis (ICD10-I70.0), coronary artery atherosclerosis and emphysema (ICD10-J43.9).   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  The Common Hereditary Gene Panel offered by [facility] includes sequencing and/or deletion duplication testing of the following 48 genes: APC, ATM, AXIN2, BARD1, BMPR1A, BRCA1, BRCA2, BRIP1, CDH1, CDK4, CDKN2A (p14ARF), CDKN2A (p16INK4a), CHEK2, CTNNA1, DICER1, EPCAM (Deletion/duplication testing only), GREM1 (promoter region deletion/duplication testing only), KIT, MEN1, MLH1, MSH2, MSH3, MSH6, MUTYH, NBN, NF1, NHTL1, PALB2, PDGFRA, PMS2, POLD1, POLE, PTEN, RAD50, RAD51C, RAD51D, RNF43, SDHB, SDHC, SDHD, SMAD4, SMARCA4. STK11, TP53, TSC1, TSC2, and VHL.  The following genes were evaluated for sequence  changes only: SDHA and HOXB13 c.251G>A variant  only. The report date is December 04, 2018.,   02/11/2019 Imaging   CT AP W Contrast  IMPRESSION: 1. Stable appearing ill-defined pancreatic mass in the tail region extending into the splenic hilum. 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 2. The left hepatic lobe metastatic lesion is slightly larger and the right hepatic lobe lesion is slightly smaller. No new lesions are identified. 3. Small peripancreatic lymph nodes are stable. No retroperitoneal lymphadenopathy. 4. No worrisome pulmonary nodules at the lung bases and no worrisome bone lesions.      CURRENT THERAPY:  First lineFOLFIRINOX q2weeks starting4/23/20. Reduced dose of oxaliplatin starting with cycle 9 due to mild thrombocytopenia and tolerance issue  INTERVAL HISTORY:  Vincent [name] is here for a follow up and treatment. He presents to the clinic alone. He notes he is doing well. He feels rested and eating better this week. He notes fatigue for 3-4 days after pump D/c. He denies any abdominal pain. He notes his neuropathy is manageable with tingling in his fingertips. He takes Tramadol every 2-3 days as needed for back pain.    REVIEW OF SYSTEMS:   Constitutional: Denies fevers, chills (+) eating adequately, fluctuating weight loss Eyes: Denies blurriness of vision Ears, nose, mouth, throat, and face: Denies mucositis or sore throat Respiratory: Denies cough, dyspnea or wheezes Cardiovascular: Denies palpitation, chest discomfort or lower extremity swelling Gastrointestinal:  Denies nausea, heartburn or change in bowel habits Skin: Denies abnormal skin rashes Lymphatics: Denies new lymphadenopathy or easy bruising Neurological: (+) Manageable tingling in fingertips Behavioral/Psych: Mood is stable, no new changes  All other systems were reviewed with the patient and are negative.  MEDICAL HISTORY:  Past Medical  History:  Diagnosis Date  . Diabetes mellitus without complication ([place])    pt. states that he is not diabeticalthought he takes metformin  . Eye hemorrhage    right  . Family history of lung cancer   . Hypertension   . Monoallelic mutation of BRIP1 gene   . Monoallelic mutation of MUTYH gene     SURGICAL HISTORY: Past Surgical History:  Procedure Laterality Date  . ESOPHAGOGASTRODUODENOSCOPY (EGD) WITH PROPOFOL N/A 10/29/2018   Procedure: ESOPHAGOGASTRODUODENOSCOPY (EGD) WITH PROPOFOL;  Surgeon: Arta Silence, MD;  Location: [facility];  Service: Endoscopy;  Laterality: N/A;  . EUS N/A 10/29/2018   Procedure: FULL UPPER ENDOSCOPIC ULTRASOUND (EUS) RADIAL;  Surgeon: Arta Silence, MD;  Location: [facility];  Service: Endoscopy;  Laterality: N/A;  . FINE NEEDLE ASPIRATION N/A 10/29/2018   Procedure: FINE NEEDLE ASPIRATION (FNA) LINEAR;  Surgeon: Arta Silence, MD;  Location: [facility];  Service: Endoscopy;  Laterality: N/A;  . IR IMAGING GUIDED PORT INSERTION  11/11/2018  . LUMBAR LAMINECTOMY/DECOMPRESSION MICRODISCECTOMY Left 10/11/2017   Procedure: Microlumbar decompression Lumbar Four-Five Left;  Surgeon: Susa Day, MD;  Location: [facility];  Service: Orthopedics;  Laterality: Left;  Microlumbar decompression Lumbar Four-Five Left  . removal of wisdom teeth  1977    I have reviewed the social history and family history with the patient and they are unchanged from previous note.  ALLERGIES:  has No Known Allergies.  MEDICATIONS:  Current Outpatient Medications  Medication Sig Dispense Refill  . Accu-Chek FastClix Lancets MISC daily. for testing    . aspirin EC 81 MG tablet Take 1 tablet (81 mg total) by mouth daily after breakfast. Resume 4 days post-op    . Ginger, Zingiber officinalis, (GINGER ROOT PO) Take 750 mg by mouth.    Marland Kitchen  KLOR-CON M20 20 MEQ tablet TAKE 1 TABLET (20 MEQ TOTAL) BY MOUTH EVERY OTHER DAY. 30 tablet 0  . lidocaine-prilocaine (EMLA) cream Apply to  affected area once 30 g 3  . lisinopril (PRINIVIL,ZESTRIL) 10 MG tablet Take 10 mg by mouth daily after breakfast.    . metFORMIN (GLUCOPHAGE) 1000 MG tablet TAKE 1 TABLET BY MOUTH AT BREAKFAST AND 1/2 TABLET AT SUPPER    . ondansetron (ZOFRAN) 8 MG tablet Take 1 tablet (8 mg total) by mouth 2 (two) times daily as needed for refractory nausea / vomiting. Start on day 3 after chemotherapy. 30 tablet 1  . prochlorperazine (COMPAZINE) 10 MG tablet TAKE 1 TABLET (10 MG TOTAL) BY MOUTH EVERY 6 (SIX) HOURS AS NEEDED (NAUSEA). 30 tablet 1  . simvastatin (ZOCOR) 40 MG tablet Take 20 mg by mouth every evening.    . traMADol (ULTRAM) 50 MG tablet Take 2 tablets (100 mg total) by mouth every 6 (six) hours as needed. 60 tablet 0   No current facility-administered medications for this visit.    Facility-Administered Medications Ordered in Other Visits  Medication Dose Route Frequency Provider Last Rate Last Dose  . atropine injection 0.4 mg  0.4 mg Intravenous Once PRN Truitt Merle, MD      . dextrose 5 % solution   Intravenous Once Truitt Merle, MD      . fluorouracil (ADRUCIL) 5,850 mg in sodium chloride 0.9 % 133 mL chemo infusion  2,400 mg/m2 (Treatment Plan Recorded) Intravenous 1 day or 1 dose Truitt Merle, MD      . irinotecan (CAMPTOSAR) 360 mg in dextrose 5 % 500 mL chemo infusion  150 mg/m2 (Treatment Plan Recorded) Intravenous Once Truitt Merle, MD      . leucovorin 976 mg in dextrose 5 % 250 mL infusion  400 mg/m2 (Treatment Plan Recorded) Intravenous Once Truitt Merle, MD      . oxaliplatin (ELOXATIN) 150 mg in dextrose 5 % 500 mL chemo infusion  62 mg/m2 (Treatment Plan Recorded) Intravenous Once Truitt Merle, MD      . sodium chloride flush (NS) 0.9 % injection 10 mL  10 mL Intracatheter PRN Truitt Merle, MD        PHYSICAL EXAMINATION: ECOG PERFORMANCE STATUS: 1 - Symptomatic but completely ambulatory  Vitals:   04/09/19 0942  BP: 128/87  Pulse: 80  Resp: 18  Temp: 98.5 F (36.9 C)  SpO2: 98%   Filed  Weights   04/09/19 0942  Weight: 212 lb 1.6 oz (96.2 kg)    GENERAL:alert, no distress and comfortable SKIN: skin color, texture, turgor are normal, no rashes or significant lesions EYES: normal, Conjunctiva are pink and non-injected, sclera clear  NECK: supple, thyroid normal size, non-tender, without nodularity LYMPH:  no palpable lymphadenopathy in the cervical, axillary  LUNGS: clear to auscultation and percussion with normal breathing effort HEART: regular rate & rhythm and no murmurs and no lower extremity edema ABDOMEN:abdomen soft, non-tender and normal bowel sounds Musculoskeletal:no cyanosis of digits and no clubbing  NEURO: alert & oriented x 3 with fluent speech, no focal motor/sensory deficits  LABORATORY DATA:  I have reviewed the data as listed CBC Latest Ref Rng & Units 04/09/2019 03/19/2019 02/26/2019  WBC 4.0 - 10.5 K/uL 5.3 6.1 5.1  Hemoglobin 13.0 - 17.0 g/dL 10.6(L) 11.5(L) 10.7(L)  Hematocrit 39.0 - 52.0 % 32.6(L) 35.7(L) 33.1(L)  Platelets 150 - 400 K/uL 128(L) 150 121(L)     CMP Latest Ref Rng & Units 04/09/2019 03/19/2019 02/26/2019  Glucose 70 - 99 mg/dL 136(H) 144(H) 142(H)  BUN 8 - 23 mg/dL '13 11 10  ' Creatinine 0.61 - 1.24 mg/dL 0.81 0.91 0.82  Sodium 135 - 145 mmol/L 139 141 142  Potassium 3.5 - 5.1 mmol/L 4.0 4.2 3.7  Chloride 98 - 111 mmol/L 106 105 107  CO2 22 - 32 mmol/L '26 26 25  ' Calcium 8.9 - 10.3 mg/dL 8.6(L) 9.3 9.1  Total Protein 6.5 - 8.1 g/dL 6.3(L) 6.7 6.4(L)  Total Bilirubin 0.3 - 1.2 mg/dL 0.4 0.6 0.5  Alkaline Phos 38 - 126 U/L 97 101 109  AST 15 - 41 U/L 24 19 14(L)  ALT 0 - 44 U/L 32 26 16      RADIOGRAPHIC STUDIES: I have personally reviewed the radiological images as listed and agreed with the findings in the report. No results found.   ASSESSMENT & PLAN:  Vincent [name] is a 68 y.o. male with   1. Adenocarcinoma oftail of Pancreas, cT3N0Mx, with liver metastasis -He was recently diagnosedin3/2020.  -His CT APand  abdominal MRI showed a 1.6cm lesion in thedorm, althoughit appears likely a benign cyst on the CT scan, however the MRI features are concerning for livermetastasis. -Ipreviouslydiscussed to confirm with definitive prognosis we can do liver biopsy. Given thetypical scan findings and growth over time, the liver lesions are likely metastatic lesions. Given the current COVID-19,I will probablyskip biopsy. Heagreed. -Ipreviouslydiscussed if metastatic disease, he is no longer a surgical candidate and his cancer is no longer curable. Goal of therapy would be palliative toprolong his life and improve his quality of life. He understands. -He started first-line FOLFIRINOX every 2 weeks on 11/28/18. Heistolerating welloverallwith fatigue andfluctuatingappetite and cold sensitivity, intermittent neuropathy s/p C6 and mild diarrhea.   -He has had 2 episodes of dizziness and syncope upon standing with cycle 4. I addedIV fluids to pump d/c -S/p cycle 5CT AP from7/7/20 showsoverall mixed response/stable disease. I previouslydiscussed we can continue current treatment, but it is possible his disease will truly progress on this regimenin near future andwewillchange tosecond linechemo Abraxane and Gemcitabine. He understands.  -He is tolerating current regimen well, and performance status has improved. -He is clinically doing well and stable. He is able to recover well and has manageable tingling in his fingertips. Labs reviewed, CBC and CMP WNL except Hg 10.6, PLT 128K. Overall adequate to proceed with C9 FOLFIRINOX today with reduced oxaliplatin dose given thrombocytopenia and mild neuropathy.  -I discussed he can take chemo breaks as needed. He is agreeable.  -repeat scan after 2 more cycle chemo.    2. Epigastricand LUQpainradiating to his back -secondary topancreaticcancer -Ipreviouslyprescribed him NORCObut does not feel it helps as much as Tramadol. I advised him not to  go over 8 tabs Tramadol in a day. -Abdominal painhas most revolvedsince he started chemo, backand musclepain manageable with tramadol for significant pain every 2-3 days if needed. He takes Tylenol for mild to moderate pain.He is not taking norco or oxycodone. Stable  -I refilled tramadol today (04/09/19)  3. Lower appetite, Weight loss  -Eating exacerbates his epigastric pain and nausea -He was initially at 230 pounds at baseline -Continue tofollow up with Dietician -Appetitehas been adequate. His weight has been fluctuating, but mostly stable.  -I recommend he increase his protein and calorie intake. He can also continue Glucerna.   4. DM, HTN  -On Metformin and Lisinopril -We will monitor his blood pressure and blood glucose closely during the chemotherapy. May adjustmedication during chemoif needed -I  advised him to avoid sweet during the first 3-4 of each cycle given steroid premeds.  -Continue to f/u with PCP -with increased water intake his dizziness has resolved.  -BPnormal lately. Will check A1c with next lab   5. Hypokalemia  -will continue toincrease potassium in his dietand continue oral potassium  6. 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    PLAN: -labs reviewed and adequate to proceed with C64mOLFIRINOX todaywith Udenyca on day 3,  oxaliplatin dose reduction due to tolerance issue and mild thrombocytopenia.  -lab, flush,f/uand chemo in2weeks   No problem-specific Assessment & Plan notes found for this encounter.   No orders of the defined types were placed in this encounter.  All questions were answered. The patient knows to call the clinic with any problems, questions or concerns. No barriers to learning was detected. I spent 20 minutes counseling the patient face to face. The total time spent in the  appointment was 25 minutes and more than 50% was on counseling and review of test results     YTruitt Merle MD 04/09/2019   I, AJoslyn Devon am acting as scribe for YTruitt Merle MD.   I have reviewed the above documentation for accuracy and completeness, and I agree with the above.

## 2019-04-09 ENCOUNTER — Inpatient Hospital Stay: Payer: Medicare Other | Attending: Hematology

## 2019-04-09 ENCOUNTER — Inpatient Hospital Stay: Payer: Medicare Other

## 2019-04-09 ENCOUNTER — Inpatient Hospital Stay (HOSPITAL_BASED_OUTPATIENT_CLINIC_OR_DEPARTMENT_OTHER): Payer: Medicare Other | Admitting: Hematology

## 2019-04-09 ENCOUNTER — Other Ambulatory Visit: Payer: Self-pay

## 2019-04-09 ENCOUNTER — Telehealth: Payer: Self-pay | Admitting: Hematology

## 2019-04-09 ENCOUNTER — Encounter: Payer: Self-pay | Admitting: Hematology

## 2019-04-09 VITALS — BP 128/87 | HR 80 | Temp 98.5°F | Resp 18 | Ht 76.0 in | Wt 212.1 lb

## 2019-04-09 DIAGNOSIS — Z23 Encounter for immunization: Secondary | ICD-10-CM | POA: Diagnosis not present

## 2019-04-09 DIAGNOSIS — Z79899 Other long term (current) drug therapy: Secondary | ICD-10-CM | POA: Diagnosis not present

## 2019-04-09 DIAGNOSIS — C250 Malignant neoplasm of head of pancreas: Secondary | ICD-10-CM | POA: Diagnosis not present

## 2019-04-09 DIAGNOSIS — E119 Type 2 diabetes mellitus without complications: Secondary | ICD-10-CM | POA: Diagnosis not present

## 2019-04-09 DIAGNOSIS — E876 Hypokalemia: Secondary | ICD-10-CM | POA: Diagnosis not present

## 2019-04-09 DIAGNOSIS — I10 Essential (primary) hypertension: Secondary | ICD-10-CM | POA: Insufficient documentation

## 2019-04-09 DIAGNOSIS — C252 Malignant neoplasm of tail of pancreas: Secondary | ICD-10-CM

## 2019-04-09 DIAGNOSIS — C787 Secondary malignant neoplasm of liver and intrahepatic bile duct: Secondary | ICD-10-CM | POA: Diagnosis not present

## 2019-04-09 DIAGNOSIS — D696 Thrombocytopenia, unspecified: Secondary | ICD-10-CM | POA: Insufficient documentation

## 2019-04-09 DIAGNOSIS — Z5111 Encounter for antineoplastic chemotherapy: Secondary | ICD-10-CM | POA: Insufficient documentation

## 2019-04-09 DIAGNOSIS — Z95828 Presence of other vascular implants and grafts: Secondary | ICD-10-CM

## 2019-04-09 DIAGNOSIS — Z7984 Long term (current) use of oral hypoglycemic drugs: Secondary | ICD-10-CM | POA: Insufficient documentation

## 2019-04-09 DIAGNOSIS — Z7189 Other specified counseling: Secondary | ICD-10-CM

## 2019-04-09 LAB — CMP (CANCER CENTER ONLY)
ALT: 32 U/L (ref 0–44)
AST: 24 U/L (ref 15–41)
Albumin: 3.5 g/dL (ref 3.5–5.0)
Alkaline Phosphatase: 97 U/L (ref 38–126)
Anion gap: 7 (ref 5–15)
BUN: 13 mg/dL (ref 8–23)
CO2: 26 mmol/L (ref 22–32)
Calcium: 8.6 mg/dL — ABNORMAL LOW (ref 8.9–10.3)
Chloride: 106 mmol/L (ref 98–111)
Creatinine: 0.81 mg/dL (ref 0.61–1.24)
GFR, Est AFR Am: >60 mL/min (ref >60)
GFR, Estimated: >60 mL/min (ref >60)
Glucose, Bld: 136 mg/dL — ABNORMAL HIGH (ref 70–99)
Potassium: 4 mmol/L (ref 3.5–5.1)
Sodium: 139 mmol/L (ref 135–145)
Total Bilirubin: 0.4 mg/dL (ref 0.3–1.2)
Total Protein: 6.3 g/dL — ABNORMAL LOW (ref 6.5–8.1)

## 2019-04-09 LAB — CBC WITH DIFFERENTIAL (CANCER CENTER ONLY)
Abs Immature Granulocytes: 0.02 10*3/uL (ref 0.00–0.07)
Basophils Absolute: 0.1 10*3/uL (ref 0.0–0.1)
Basophils Relative: 1 %
Eosinophils Absolute: 0.1 10*3/uL (ref 0.0–0.5)
Eosinophils Relative: 2 %
HCT: 32.6 % — ABNORMAL LOW (ref 39.0–52.0)
Hemoglobin: 10.6 g/dL — ABNORMAL LOW (ref 13.0–17.0)
Immature Granulocytes: 0 %
Lymphocytes Relative: 17 %
Lymphs Abs: 0.9 10*3/uL (ref 0.7–4.0)
MCH: 35.9 pg — ABNORMAL HIGH (ref 26.0–34.0)
MCHC: 32.5 g/dL (ref 30.0–36.0)
MCV: 110.5 fL — ABNORMAL HIGH (ref 80.0–100.0)
Monocytes Absolute: 0.6 10*3/uL (ref 0.1–1.0)
Monocytes Relative: 12 %
Neutro Abs: 3.6 10*3/uL (ref 1.7–7.7)
Neutrophils Relative %: 68 %
Platelet Count: 128 10*3/uL — ABNORMAL LOW (ref 150–400)
RBC: 2.95 MIL/uL — ABNORMAL LOW (ref 4.22–5.81)
RDW: 13.9 % (ref 11.5–15.5)
WBC Count: 5.3 10*3/uL (ref 4.0–10.5)
nRBC: 0 % (ref 0.0–0.2)

## 2019-04-09 MED ORDER — OXALIPLATIN CHEMO INJECTION 100 MG/20ML
62.0000 mg/m2 | Freq: Once | INTRAVENOUS | Status: AC
  Administered 2019-04-09: 150 mg via INTRAVENOUS
  Filled 2019-04-09: qty 20

## 2019-04-09 MED ORDER — SODIUM CHLORIDE 0.9 % IV SOLN
Freq: Once | INTRAVENOUS | Status: AC
  Administered 2019-04-09: 14:00:00 via INTRAVENOUS
  Filled 2019-04-09: qty 250

## 2019-04-09 MED ORDER — TRAMADOL HCL 50 MG PO TABS: 100.0000 mg | ORAL_TABLET | Freq: Four times a day (QID) | ORAL | 0 refills | Status: DC | PRN

## 2019-04-09 MED ORDER — DEXTROSE 5 % IV SOLN
Freq: Once | INTRAVENOUS | Status: DC
  Filled 2019-04-09: qty 250

## 2019-04-09 MED ORDER — IRINOTECAN HCL CHEMO INJECTION 100 MG/5ML
150.0000 mg/m2 | Freq: Once | INTRAVENOUS | Status: AC
  Administered 2019-04-09: 360 mg via INTRAVENOUS
  Filled 2019-04-09: qty 15

## 2019-04-09 MED ORDER — LEUCOVORIN CALCIUM INJECTION 350 MG
400.0000 mg/m2 | Freq: Once | INTRAVENOUS | Status: AC
  Administered 2019-04-09: 976 mg via INTRAVENOUS
  Filled 2019-04-09: qty 48.8

## 2019-04-09 MED ORDER — PALONOSETRON HCL INJECTION 0.25 MG/5ML
INTRAVENOUS | Status: AC
  Filled 2019-04-09: qty 5

## 2019-04-09 MED ORDER — SODIUM CHLORIDE 0.9 % IV SOLN
2400.0000 mg/m2 | INTRAVENOUS | Status: DC
  Administered 2019-04-09: 5850 mg via INTRAVENOUS
  Filled 2019-04-09: qty 117

## 2019-04-09 MED ORDER — OXALIPLATIN CHEMO INJECTION 100 MG/20ML: 60.0000 mg/m2 | Freq: Once | INTRAVENOUS | Status: DC

## 2019-04-09 MED ORDER — ATROPINE SULFATE 1 MG/ML IJ SOLN
INTRAMUSCULAR | Status: AC
  Filled 2019-04-09: qty 1

## 2019-04-09 MED ORDER — SODIUM CHLORIDE 0.9% FLUSH
10.0000 mL | INTRAVENOUS | Status: DC | PRN
  Filled 2019-04-09: qty 10

## 2019-04-09 MED ORDER — ATROPINE SULFATE 0.4 MG/ML IJ SOLN
0.4000 mg | Freq: Once | INTRAMUSCULAR | Status: AC | PRN
  Administered 2019-04-09: 0.4 mg via INTRAVENOUS

## 2019-04-09 MED ORDER — SODIUM CHLORIDE 0.9 % IV SOLN
Freq: Once | INTRAVENOUS | Status: AC
  Administered 2019-04-09: 11:00:00 via INTRAVENOUS
  Filled 2019-04-09: qty 5

## 2019-04-09 MED ORDER — ATROPINE SULFATE 0.4 MG/ML IJ SOLN
INTRAMUSCULAR | Status: AC
  Filled 2019-04-09: qty 1

## 2019-04-09 MED ORDER — PALONOSETRON HCL INJECTION 0.25 MG/5ML
0.2500 mg | Freq: Once | INTRAVENOUS | Status: AC
  Administered 2019-04-09: 0.25 mg via INTRAVENOUS

## 2019-04-09 MED ORDER — SODIUM CHLORIDE 0.9% FLUSH
10.0000 mL | Freq: Once | INTRAVENOUS | Status: AC
  Administered 2019-04-09: 10 mL
  Filled 2019-04-09: qty 10

## 2019-04-09 MED ORDER — DEXTROSE 5 % IV SOLN
Freq: Once | INTRAVENOUS | Status: AC
  Administered 2019-04-09: 11:00:00 via INTRAVENOUS
  Filled 2019-04-09: qty 250

## 2019-04-09 NOTE — Telephone Encounter (Signed)
Scheduled appt per 9/2 los.  Sent a secure chat message, patient going to get a print out of his calendar and avs while in treatment,

## 2019-04-09 NOTE — Patient Instructions (Addendum)
[facility] Discharge Instructions for Patients Receiving Chemotherapy  Today you received the following chemotherapy agents:  Leucovorin, Oxaliplatin, Irinotecan, Flourouracil  To help prevent nausea and vomiting after your treatment, we encourage you to take your nausea medication as needed.  If you develop nausea and vomiting that is not controlled by your nausea medication, call the clinic.   BELOW ARE SYMPTOMS THAT SHOULD BE REPORTED IMMEDIATELY:  *FEVER GREATER THAN 100.5 F  *CHILLS WITH OR WITHOUT FEVER  NAUSEA AND VOMITING THAT IS NOT CONTROLLED WITH YOUR NAUSEA MEDICATION  *UNUSUAL SHORTNESS OF BREATH  *UNUSUAL BRUISING OR BLEEDING  TENDERNESS IN MOUTH AND THROAT WITH OR WITHOUT PRESENCE OF ULCERS  *URINARY PROBLEMS  *BOWEL PROBLEMS  UNUSUAL RASH Items with * indicate a potential emergency and should be followed up as soon as possible.  Feel free to call the clinic should you have any questions or concerns. The clinic phone number is [phone].  Please show the [facility] at check-in to the Emergency Department and triage nurse.

## 2019-04-11 ENCOUNTER — Other Ambulatory Visit: Payer: Self-pay

## 2019-04-11 ENCOUNTER — Inpatient Hospital Stay: Payer: Medicare Other

## 2019-04-11 VITALS — BP 139/97 | HR 64 | Temp 98.0°F | Resp 18

## 2019-04-11 DIAGNOSIS — Z7189 Other specified counseling: Secondary | ICD-10-CM

## 2019-04-11 DIAGNOSIS — Z5111 Encounter for antineoplastic chemotherapy: Secondary | ICD-10-CM | POA: Diagnosis not present

## 2019-04-11 DIAGNOSIS — C252 Malignant neoplasm of tail of pancreas: Secondary | ICD-10-CM

## 2019-04-11 MED ORDER — PEGFILGRASTIM-CBQV 6 MG/0.6ML ~~LOC~~ SOSY
6.0000 mg | PREFILLED_SYRINGE | Freq: Once | SUBCUTANEOUS | Status: AC
  Administered 2019-04-11: 15:00:00 6 mg via SUBCUTANEOUS

## 2019-04-11 MED ORDER — SODIUM CHLORIDE 0.9% FLUSH
10.0000 mL | INTRAVENOUS | Status: DC | PRN
  Administered 2019-04-11: 10 mL
  Filled 2019-04-11: qty 10

## 2019-04-11 MED ORDER — HEPARIN SOD (PORK) LOCK FLUSH 100 UNIT/ML IV SOLN
500.0000 [IU] | Freq: Once | INTRAVENOUS | Status: AC | PRN
  Administered 2019-04-11: 15:00:00 500 [IU]
  Filled 2019-04-11: qty 5

## 2019-04-11 MED ORDER — PEGFILGRASTIM-CBQV 6 MG/0.6ML ~~LOC~~ SOSY
PREFILLED_SYRINGE | SUBCUTANEOUS | Status: AC
  Filled 2019-04-11: qty 0.6

## 2019-04-11 NOTE — Patient Instructions (Signed)

## 2019-04-16 ENCOUNTER — Other Ambulatory Visit: Payer: Self-pay | Admitting: Hematology

## 2019-04-16 NOTE — Telephone Encounter (Signed)
Refill request

## 2019-04-22 NOTE — Progress Notes (Signed)
[place]   [phone] [phone]   Clinic Follow up Note   Patient Care Team: Leonard Downing, MD as PCP - General (Family Medicine) 04/23/2019  CHIEF COMPLAINT: f/u pancreas cancer   SUMMARY OF ONCOLOGIC HISTORY: Oncology History Overview Note  Cancer Staging Pancreatic cancer [facility]) Staging form: Exocrine Pancreas, AJCC 8th Edition - Clinical stage from 10/29/2018: Stage IV (cT3, cN0, cM1) - Signed by Truitt Merle, MD on 11/05/2018     Pancreatic cancer ([place])  10/09/2018 Procedure   Upper Endoscopy by. Dr Paulita Fujita 10/29/18 IMPRESSION - There was no evidence of significant pathology in the left lobe of the liver. - There was no sign of significant pathology in the gallbladder. - Many abnormal lymph nodes were visualized in the splenic region (level 19), perigastric region and peripancreatic region. - A mass was identified in the pancreatic tail. This was staged T4 N2 Mx by endosonographic criteria. Fine needle aspiration performed.   10/10/2018 Imaging   CT Abdomen 10/10/18 IMPRESSION: 1. Uncomplicated acute diverticulitis of the proximal sigmoid colon. 2. Ill-defined low-density in the pancreatic tail measuring at least 4.7 cm, with possible punctate peripheral calcifications. This is suspicious for pancreatic neoplasm, however sequela of prior pancreatitis is also considered. Recommend further characterization with pancreatic protocol MRI when patient is able to tolerate breath hold technique. 3. 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 4. 16 mm cyst in the left lobe of the liver. Aortic Atherosclerosis (ICD10-I70.0).   10/16/2018 Imaging   MRI Abdomen at [facility] 10/16/18 IMPRESSION: 1. Mass in the tail the pancreas concerning for neoplasm. This contacts the spleen in the splenic hilum. 2. There is stranding in the fat extending from the tumor to the celiac axis and left  adrenal gland. Cannot exclude infiltrating tumor. 3. 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 4. metastatic lesion in the liver   10/29/2018 Initial Biopsy   Diagnosis 10/29/18 FINE NEEDLE ASPIRATION, ENDOSCOPIC, PANCREAS TAIL(SPECIMEN 1 OF 1 COLLECTED 10/29/18): MALIGNANT CELLS PRESENT, CONSISTENT WITH ADENOCARCINOMA. SEE COMMENT. COMMENT: THERE IS INSUFFICIENT TUMOR PRESENT FOR ADDITIONAL STUDIES.   10/29/2018 Cancer Staging   Staging form: Exocrine Pancreas, AJCC 8th Edition - Clinical stage from 10/29/2018: Stage IV (cT3, cN0, cM1) - Signed by Truitt Merle, MD on 11/05/2018   11/04/2018 Initial Diagnosis   Pancreatic cancer ([place])   11/27/2018 Imaging   CT Chest  IMPRESSION: 1.  No acute process or evidence of metastatic disease in the chest. 2. Locally advanced pancreatic carcinoma, as detailed previously. Cannot exclude superimposed pancreatitis. 3. New and enlarging liver lesions, suspicious for hepatic metastasis. 4. Aortic atherosclerosis (ICD10-I70.0), coronary artery atherosclerosis and emphysema (ICD10-J43.9).   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  The Common Hereditary Gene Panel offered by [facility] includes sequencing and/or deletion duplication testing of the following 48 genes: APC, ATM, AXIN2, BARD1, BMPR1A, BRCA1, BRCA2, BRIP1, CDH1, CDK4, CDKN2A (p14ARF), CDKN2A (p16INK4a), CHEK2, CTNNA1, DICER1, EPCAM (Deletion/duplication testing only), GREM1 (promoter region deletion/duplication testing only), KIT, MEN1, MLH1, MSH2, MSH3, MSH6, MUTYH, NBN, NF1, NHTL1, PALB2, PDGFRA, PMS2, POLD1, POLE, PTEN, RAD50, RAD51C, RAD51D, RNF43, SDHB, SDHC, SDHD, SMAD4, SMARCA4. STK11, TP53, TSC1, TSC2, and VHL.  The following genes were evaluated for sequence changes only: SDHA and  HOXB13 c.251G>A variant only. The report date is December 04, 2018.,   02/11/2019 Imaging   CT AP W Contrast  IMPRESSION: 1. Stable appearing ill-defined pancreatic mass in the tail region extending into the splenic hilum. 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 2. The left hepatic lobe metastatic lesion is slightly larger and the right hepatic lobe lesion is slightly smaller. No new lesions are identified. 3. Small peripancreatic lymph nodes are stable. No retroperitoneal lymphadenopathy. 4. No worrisome pulmonary nodules at the lung bases and no worrisome bone lesions.     CURRENT THERAPY: First lineFOLFIRINOX q2weeks starting4/23/20. Reduced dose of oxaliplatin starting with cycle 9 due to mild thrombocytopenia and tolerance issue. Reduced dose 5FU and irinotecan with cycle 10 due to further thrombocytopenia and neuropathy   INTERVAL HISTORY: Mr. Awan returns for f/u and treatment as scheduled. He completed cycle 9 on 04/09/19 with dose reduced oxaliplatin. He feels well today. Appetite and energy are low for 2 days then he recovers. Remains active. Denies mouth sores, n/v/c/d. No bleeding. Seldom uses pain medication for abd pain. He has intermittent mild tingling in fingertips, no trouble with functions. Tingling and "twitching" in his feet still intermittent but occasionally wakes him up at night and cant sleep. When limbs feel cold he warms up in a warm tub which helps. Neuropathy is stable from last cycle. No fall or balance issue. Otherwise denies fever, chills, cough, chest pain, dyspnea, leg swelling.    MEDICAL HISTORY:  Past Medical History:  Diagnosis Date   Diabetes mellitus without complication ([place])    pt. states that he is not diabeticalthought he takes metformin   Eye hemorrhage    right   Family history of lung cancer    Hypertension    Monoallelic mutation of BRIP1 gene    Monoallelic mutation of MUTYH gene      SURGICAL HISTORY: Past Surgical History:  Procedure Laterality Date   ESOPHAGOGASTRODUODENOSCOPY (EGD) WITH PROPOFOL N/A 10/29/2018   Procedure: ESOPHAGOGASTRODUODENOSCOPY (EGD) WITH PROPOFOL;  Surgeon: Arta Silence, MD;  Location: [facility];  Service: Endoscopy;  Laterality: N/A;   EUS N/A 10/29/2018   Procedure: FULL UPPER ENDOSCOPIC ULTRASOUND (EUS) RADIAL;  Surgeon: Arta Silence, MD;  Location: [facility];  Service: Endoscopy;  Laterality: N/A;   FINE NEEDLE ASPIRATION N/A 10/29/2018   Procedure: FINE NEEDLE ASPIRATION (FNA) LINEAR;  Surgeon: Arta Silence, MD;  Location: [facility];  Service: Endoscopy;  Laterality: N/A;   IR IMAGING GUIDED PORT INSERTION  11/11/2018   LUMBAR LAMINECTOMY/DECOMPRESSION MICRODISCECTOMY Left 10/11/2017   Procedure: Microlumbar decompression Lumbar Four-Five Left;  Surgeon: Susa Day, MD;  Location: [place];  Service: Orthopedics;  Laterality: Left;  Microlumbar decompression Lumbar Four-Five Left   removal of wisdom teeth  1977    I have reviewed the social history and family history with the patient and they are unchanged from previous note.  ALLERGIES:  has No Known Allergies.  MEDICATIONS:  Current Outpatient Medications  Medication Sig Dispense Refill   Accu-Chek FastClix Lancets MISC daily. for testing     aspirin EC 81 MG tablet Take 1 tablet (81 mg total) by mouth daily after breakfast. Resume 4 days post-op     Ginger, Zingiber officinalis, (GINGER ROOT PO) Take 750 mg by mouth.     KLOR-CON M20 20 MEQ tablet TAKE 1 TABLET (20 MEQ TOTAL) BY MOUTH EVERY OTHER DAY. 30 tablet 0   lidocaine-prilocaine (EMLA) cream Apply to affected area once 30 g 3   metFORMIN (GLUCOPHAGE) 1000 MG tablet TAKE  1 TABLET BY MOUTH AT BREAKFAST AND 1/2 TABLET AT SUPPER     simvastatin (ZOCOR) 40 MG tablet Take 20 mg by mouth every evening.     traMADol (ULTRAM) 50 MG tablet Take 2 tablets (100 mg total) by mouth every 6 (six) hours as  needed. 60 tablet 0   gabapentin (NEURONTIN) 100 MG capsule Take 1 capsule (100 mg total) by mouth at bedtime. 30 capsule 1   lisinopril (PRINIVIL,ZESTRIL) 10 MG tablet Take 10 mg by mouth daily after breakfast.     ondansetron (ZOFRAN) 8 MG tablet Take 1 tablet (8 mg total) by mouth 2 (two) times daily as needed for refractory nausea / vomiting. Start on day 3 after chemotherapy. 30 tablet 1   prochlorperazine (COMPAZINE) 10 MG tablet TAKE 1 TABLET (10 MG TOTAL) BY MOUTH EVERY 6 (SIX) HOURS AS NEEDED (NAUSEA). 30 tablet 1   No current facility-administered medications for this visit.    Facility-Administered Medications Ordered in Other Visits  Medication Dose Route Frequency Provider Last Rate Last Dose   atropine injection 0.4 mg  0.4 mg Intravenous Once PRN Truitt Merle, MD       fluorouracil (ADRUCIL) 5,350 mg in sodium chloride 0.9 % 143 mL chemo infusion  2,200 mg/m2 (Treatment Plan Recorded) Intravenous 1 day or 1 dose Truitt Merle, MD       heparin lock flush 100 unit/mL  500 Units Intracatheter Once PRN Truitt Merle, MD       influenza vac split quadrivalent PF (FLUARIX) injection 0.5 mL  0.5 mL Intramuscular Once Truitt Merle, MD       irinotecan (CAMPTOSAR) 320 mg in dextrose 5 % 500 mL chemo infusion  130 mg/m2 (Treatment Plan Recorded) Intravenous Once Truitt Merle, MD       leucovorin 976 mg in dextrose 5 % 250 mL infusion  400 mg/m2 (Treatment Plan Recorded) Intravenous Once Truitt Merle, MD       oxaliplatin (ELOXATIN) 150 mg in dextrose 5 % 500 mL chemo infusion  150 mg Intravenous Once Truitt Merle, MD 265 mL/hr at 04/23/19 1109 150 mg at 04/23/19 1109   sodium chloride flush (NS) 0.9 % injection 10 mL  10 mL Intracatheter PRN Truitt Merle, MD        PHYSICAL EXAMINATION: ECOG PERFORMANCE STATUS: 1 - Symptomatic but completely ambulatory  Vitals:   04/23/19 0841  BP: 116/89  Pulse: 74  Resp: 18  Temp: 97.8 F (36.6 C)  SpO2: 98%   Filed Weights   04/23/19 0841  Weight: 212 lb  14.4 oz (96.6 kg)    GENERAL:alert, no distress and comfortable SKIN: no obvious rash  EYES:  sclera clear OROPHARYNX: no thrush or ulcers LUNGS: clear with normal breathing effort HEART: regular rate & rhythm, no lower extremity edema ABDOMEN:abdomen soft, non-tender and normal bowel sounds Musculoskeletal:no cyanosis of digits  NEURO: alert & oriented x 3 with fluent speech, intact peripheral vibratory sense per tuning fork exam PAC without erythema   LABORATORY DATA:  I have reviewed the data as listed CBC Latest Ref Rng & Units 04/23/2019 04/09/2019 03/19/2019  WBC 4.0 - 10.5 K/uL 5.1 5.3 6.1  Hemoglobin 13.0 - 17.0 g/dL 10.4(L) 10.6(L) 11.5(L)  Hematocrit 39.0 - 52.0 % 32.3(L) 32.6(L) 35.7(L)  Platelets 150 - 400 K/uL 98(L) 128(L) 150     CMP Latest Ref Rng & Units 04/23/2019 04/09/2019 03/19/2019  Glucose 70 - 99 mg/dL 146(H) 136(H) 144(H)  BUN 8 - 23 mg/dL '12 13 11  ' Creatinine  0.61 - 1.24 mg/dL 0.93 0.81 0.91  Sodium 135 - 145 mmol/L 142 139 141  Potassium 3.5 - 5.1 mmol/L 4.3 4.0 4.2  Chloride 98 - 111 mmol/L 108 106 105  CO2 22 - 32 mmol/L '26 26 26  ' Calcium 8.9 - 10.3 mg/dL 8.5(L) 8.6(L) 9.3  Total Protein 6.5 - 8.1 g/dL 6.1(L) 6.3(L) 6.7  Total Bilirubin 0.3 - 1.2 mg/dL 0.3 0.4 0.6  Alkaline Phos 38 - 126 U/L 113 97 101  AST 15 - 41 U/L '22 24 19  ' ALT 0 - 44 U/L 30 32 26      RADIOGRAPHIC STUDIES: I have personally reviewed the radiological images as listed and agreed with the findings in the report. No results found.   ASSESSMENT & PLAN: Detravion Tester is a 68 y.o. male with   1. Adenocarcinoma oftail of Pancreas, cT3N0Mx, with liver metastasis -diagnosedin3/2020. CT APand abdominal MRI showed a 1.6cm lesion in thedorm, althoughit appears likely a benign cyst on the CT scan, however the MRI features are concerning for livermetastasis.due to [place], liver biopsy was not done  -He started first-line FOLFIRINOX every 2 weeks on 11/28/18.  Heistolerating welloverallwith fatigue andfluctuatingappetite and cold sensitivity, intermittent neuropathy s/p C6 and mild diarrhea.  -He has had 2 episodes of dizziness and syncope upon standing with cycle 4. He previously received IV fluids to pump d/c -S/p cycle 5CT AP from7/7/20 showsoverall mixed response/stable disease; treatment was continued  -he currently tolerates treatment well with good PS -Oxaliplatin was dose reduced with cycle 9 due to mild neuropathy and thrombocytopenia  2. Epigastricand LUQpainradiating to his back -secondary topancreaticcancer -Abdominal painhas most revolvedsince he started chemo, backand musclepain manageable with tramadol for significant pain every 2-3 days if needed. He takes Tylenol for mild to moderate pain.He is not taking norco or oxycodone. Stable -seldom takes pain medication  3. Lower appetite, Weight loss  -f/u with dietician -weight fluctuates but overall stable over last couple months   4. DM, HTN  -On Metformin, has not taken Lisinopril in over a month -BP 116/89 today, stable   5. Hypokalemia  -continue 20 mEq supplement daily   6. 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    Disposition:  Mr. Santillanes appears well. He completed 9 cycles of FOLFIRINOX. He tolerates treatment moderately well except mild to moderate neuropathy and thrombocytopenia. Oxaliplatin was dose reduced with cycle 9, neuropathy remained stable but occasionally interferes with sleep; I recommend starting low dose gabapentin 100 mg qHS. We reviewed potential side effects. He agrees to start. I sent in prescription today.   Labs reviewed. CA 19-9 pending. For worsening thrombocytopenia, PLT 98K, will maintain Oxaliplatin at current dose and reduce irinotecan to 130 mg/m2 and 5FU to 2200 mg/m2; proceed with cycle 10  today. He will return for f/u and cycle 12 in 2 weeks. He will be referred for restaging scan after next cycle.    Orders Placed This Encounter  Procedures   CT Abdomen Pelvis W Contrast    Standing Status:   Future    Standing Expiration Date:   04/22/2020    Order Specific Question:   If indicated for the ordered procedure, I authorize the administration of contrast media per Radiology protocol    Answer:   Yes    Order Specific Question:   Preferred imaging location?    Answer:   [facility]  Order Specific Question:   Is Oral Contrast requested for this exam?    Answer:   Yes, Per Radiology protocol    Order Specific Question:   Radiology Contrast Protocol - do NOT remove file path    Answer:   \[email or web address]   All questions were answered. The patient knows to call the clinic with any problems, questions or concerns. No barriers to learning was detected.     Alla Feeling, NP 04/23/19

## 2019-04-23 ENCOUNTER — Encounter: Payer: Self-pay | Admitting: Nurse Practitioner

## 2019-04-23 ENCOUNTER — Inpatient Hospital Stay (HOSPITAL_BASED_OUTPATIENT_CLINIC_OR_DEPARTMENT_OTHER): Payer: Medicare Other | Admitting: Nurse Practitioner

## 2019-04-23 ENCOUNTER — Inpatient Hospital Stay: Payer: Medicare Other

## 2019-04-23 ENCOUNTER — Other Ambulatory Visit: Payer: Self-pay

## 2019-04-23 VITALS — BP 116/89 | HR 74 | Temp 97.8°F | Resp 18 | Ht 76.0 in | Wt 212.9 lb

## 2019-04-23 DIAGNOSIS — C252 Malignant neoplasm of tail of pancreas: Secondary | ICD-10-CM | POA: Diagnosis not present

## 2019-04-23 DIAGNOSIS — Z5111 Encounter for antineoplastic chemotherapy: Secondary | ICD-10-CM | POA: Diagnosis not present

## 2019-04-23 DIAGNOSIS — D490 Neoplasm of unspecified behavior of digestive system: Secondary | ICD-10-CM

## 2019-04-23 DIAGNOSIS — Z95828 Presence of other vascular implants and grafts: Secondary | ICD-10-CM

## 2019-04-23 DIAGNOSIS — Z7189 Other specified counseling: Secondary | ICD-10-CM

## 2019-04-23 DIAGNOSIS — C250 Malignant neoplasm of head of pancreas: Secondary | ICD-10-CM

## 2019-04-23 LAB — CBC WITH DIFFERENTIAL (CANCER CENTER ONLY)
Abs Immature Granulocytes: 0.01 10*3/uL (ref 0.00–0.07)
Basophils Absolute: 0 10*3/uL (ref 0.0–0.1)
Basophils Relative: 1 %
Eosinophils Absolute: 0.1 10*3/uL (ref 0.0–0.5)
Eosinophils Relative: 2 %
HCT: 32.3 % — ABNORMAL LOW (ref 39.0–52.0)
Hemoglobin: 10.4 g/dL — ABNORMAL LOW (ref 13.0–17.0)
Immature Granulocytes: 0 %
Lymphocytes Relative: 25 %
Lymphs Abs: 1.3 10*3/uL (ref 0.7–4.0)
MCH: 36 pg — ABNORMAL HIGH (ref 26.0–34.0)
MCHC: 32.2 g/dL (ref 30.0–36.0)
MCV: 111.8 fL — ABNORMAL HIGH (ref 80.0–100.0)
Monocytes Absolute: 0.5 10*3/uL (ref 0.1–1.0)
Monocytes Relative: 10 %
Neutro Abs: 3.2 10*3/uL (ref 1.7–7.7)
Neutrophils Relative %: 62 %
Platelet Count: 98 10*3/uL — ABNORMAL LOW (ref 150–400)
RBC: 2.89 MIL/uL — ABNORMAL LOW (ref 4.22–5.81)
RDW: 13.6 % (ref 11.5–15.5)
WBC Count: 5.1 10*3/uL (ref 4.0–10.5)
nRBC: 0 % (ref 0.0–0.2)

## 2019-04-23 LAB — CMP (CANCER CENTER ONLY)
ALT: 30 U/L (ref 0–44)
AST: 22 U/L (ref 15–41)
Albumin: 3.4 g/dL — ABNORMAL LOW (ref 3.5–5.0)
Alkaline Phosphatase: 113 U/L (ref 38–126)
Anion gap: 8 (ref 5–15)
BUN: 12 mg/dL (ref 8–23)
CO2: 26 mmol/L (ref 22–32)
Calcium: 8.5 mg/dL — ABNORMAL LOW (ref 8.9–10.3)
Chloride: 108 mmol/L (ref 98–111)
Creatinine: 0.93 mg/dL (ref 0.61–1.24)
GFR, Est AFR Am: >60 mL/min (ref >60)
GFR, Estimated: >60 mL/min (ref >60)
Glucose, Bld: 146 mg/dL — ABNORMAL HIGH (ref 70–99)
Potassium: 4.3 mmol/L (ref 3.5–5.1)
Sodium: 142 mmol/L (ref 135–145)
Total Bilirubin: 0.3 mg/dL (ref 0.3–1.2)
Total Protein: 6.1 g/dL — ABNORMAL LOW (ref 6.5–8.1)

## 2019-04-23 MED ORDER — PALONOSETRON HCL INJECTION 0.25 MG/5ML
0.2500 mg | Freq: Once | INTRAVENOUS | Status: AC
  Administered 2019-04-23: 0.25 mg via INTRAVENOUS

## 2019-04-23 MED ORDER — DEXTROSE 5 % IV SOLN
Freq: Once | INTRAVENOUS | Status: AC
  Administered 2019-04-23: 10:00:00 via INTRAVENOUS
  Filled 2019-04-23: qty 250

## 2019-04-23 MED ORDER — ATROPINE SULFATE 0.4 MG/ML IJ SOLN
INTRAMUSCULAR | Status: AC
  Filled 2019-04-23: qty 1

## 2019-04-23 MED ORDER — SODIUM CHLORIDE 0.9 % IV SOLN
Freq: Once | INTRAVENOUS | Status: AC
  Administered 2019-04-23: 10:00:00 via INTRAVENOUS
  Filled 2019-04-23: qty 5

## 2019-04-23 MED ORDER — LEUCOVORIN CALCIUM INJECTION 350 MG
400.0000 mg/m2 | Freq: Once | INTRAVENOUS | Status: AC
  Administered 2019-04-23: 13:00:00 976 mg via INTRAVENOUS
  Filled 2019-04-23: qty 48.8

## 2019-04-23 MED ORDER — SODIUM CHLORIDE 0.9 % IV SOLN
2200.0000 mg/m2 | INTRAVENOUS | Status: DC
  Administered 2019-04-23: 5350 mg via INTRAVENOUS
  Filled 2019-04-23: qty 107

## 2019-04-23 MED ORDER — PALONOSETRON HCL INJECTION 0.25 MG/5ML
INTRAVENOUS | Status: AC
  Filled 2019-04-23: qty 5

## 2019-04-23 MED ORDER — OXALIPLATIN CHEMO INJECTION 100 MG/20ML
150.0000 mg | Freq: Once | INTRAVENOUS | Status: AC
  Administered 2019-04-23: 150 mg via INTRAVENOUS
  Filled 2019-04-23: qty 20

## 2019-04-23 MED ORDER — INFLUENZA VAC SPLIT QUAD 0.5 ML IM SUSY
PREFILLED_SYRINGE | INTRAMUSCULAR | Status: AC
  Filled 2019-04-23: qty 0.5

## 2019-04-23 MED ORDER — ATROPINE SULFATE 0.4 MG/ML IJ SOLN
0.4000 mg | Freq: Once | INTRAMUSCULAR | Status: AC | PRN
  Administered 2019-04-23: 0.4 mg via INTRAVENOUS

## 2019-04-23 MED ORDER — SODIUM CHLORIDE 0.9% FLUSH
10.0000 mL | Freq: Once | INTRAVENOUS | Status: AC
  Administered 2019-04-23: 10 mL
  Filled 2019-04-23: qty 10

## 2019-04-23 MED ORDER — GABAPENTIN 100 MG PO CAPS: 100.0000 mg | ORAL_CAPSULE | Freq: Every day | ORAL | 1 refills | Status: DC

## 2019-04-23 MED ORDER — HEPARIN SOD (PORK) LOCK FLUSH 100 UNIT/ML IV SOLN
500.0000 [IU] | Freq: Once | INTRAVENOUS | Status: DC | PRN
  Filled 2019-04-23: qty 5

## 2019-04-23 MED ORDER — SODIUM CHLORIDE 0.9% FLUSH
10.0000 mL | INTRAVENOUS | Status: DC | PRN
  Filled 2019-04-23: qty 10

## 2019-04-23 MED ORDER — INFLUENZA VAC SPLIT QUAD 0.5 ML IM SUSY
0.5000 mL | PREFILLED_SYRINGE | Freq: Once | INTRAMUSCULAR | Status: AC
  Administered 2019-04-23: 0.5 mL via INTRAMUSCULAR

## 2019-04-23 MED ORDER — IRINOTECAN HCL CHEMO INJECTION 100 MG/5ML
130.0000 mg/m2 | Freq: Once | INTRAVENOUS | Status: AC
  Administered 2019-04-23: 320 mg via INTRAVENOUS
  Filled 2019-04-23: qty 15

## 2019-04-23 MED ORDER — ATROPINE SULFATE 1 MG/ML IJ SOLN
INTRAMUSCULAR | Status: AC
  Filled 2019-04-23: qty 1

## 2019-04-23 NOTE — Progress Notes (Signed)
Ok to treat w/ platelet of 98 per today's note from Cira Rue: "PLAN: -Labs reviewed, plt 98K OK to treat with reduced dose  -CA 19-9 pending  -Proceed with cycle 10 FOLFIRINOX, continue reduced dose oxaliplatin 60 mg/m2, reduce 5FU for progressive thrombocytopenia"

## 2019-04-23 NOTE — Patient Instructions (Signed)
[facility] Discharge Instructions for Patients Receiving Chemotherapy  Today you received the following chemotherapy agents:  Leucovorin, Oxaliplatin, Irinotecan, Flourouracil  To help prevent nausea and vomiting after your treatment, we encourage you to take your nausea medication as needed.  If you develop nausea and vomiting that is not controlled by your nausea medication, call the clinic.   BELOW ARE SYMPTOMS THAT SHOULD BE REPORTED IMMEDIATELY:  *FEVER GREATER THAN 100.5 F  *CHILLS WITH OR WITHOUT FEVER  NAUSEA AND VOMITING THAT IS NOT CONTROLLED WITH YOUR NAUSEA MEDICATION  *UNUSUAL SHORTNESS OF BREATH  *UNUSUAL BRUISING OR BLEEDING  TENDERNESS IN MOUTH AND THROAT WITH OR WITHOUT PRESENCE OF ULCERS  *URINARY PROBLEMS  *BOWEL PROBLEMS  UNUSUAL RASH Items with * indicate a potential emergency and should be followed up as soon as possible.  Feel free to call the clinic should you have any questions or concerns. The clinic phone number is [phone].  Please show the [place] at check-in to the Emergency Department and triage nurse.

## 2019-04-24 ENCOUNTER — Telehealth: Payer: Self-pay | Admitting: Nurse Practitioner

## 2019-04-24 LAB — CANCER ANTIGEN 19-9: CA 19-9: 1591 U/mL — ABNORMAL HIGH (ref 0–35)

## 2019-04-24 NOTE — Telephone Encounter (Signed)
No los per 9/16.

## 2019-04-25 ENCOUNTER — Inpatient Hospital Stay: Payer: Medicare Other

## 2019-04-25 ENCOUNTER — Other Ambulatory Visit: Payer: Self-pay

## 2019-04-25 VITALS — BP 115/68 | HR 82 | Temp 98.5°F | Resp 18

## 2019-04-25 DIAGNOSIS — Z5111 Encounter for antineoplastic chemotherapy: Secondary | ICD-10-CM | POA: Diagnosis not present

## 2019-04-25 DIAGNOSIS — Z7189 Other specified counseling: Secondary | ICD-10-CM

## 2019-04-25 DIAGNOSIS — C252 Malignant neoplasm of tail of pancreas: Secondary | ICD-10-CM

## 2019-04-25 MED ORDER — PEGFILGRASTIM-CBQV 6 MG/0.6ML ~~LOC~~ SOSY
6.0000 mg | PREFILLED_SYRINGE | Freq: Once | SUBCUTANEOUS | Status: AC
  Administered 2019-04-25: 6 mg via SUBCUTANEOUS

## 2019-04-25 MED ORDER — HEPARIN SOD (PORK) LOCK FLUSH 100 UNIT/ML IV SOLN
500.0000 [IU] | Freq: Once | INTRAVENOUS | Status: AC | PRN
  Administered 2019-04-25: 500 [IU]
  Filled 2019-04-25: qty 5

## 2019-04-25 MED ORDER — PEGFILGRASTIM-CBQV 6 MG/0.6ML ~~LOC~~ SOSY
PREFILLED_SYRINGE | SUBCUTANEOUS | Status: AC
  Filled 2019-04-25: qty 0.6

## 2019-04-25 MED ORDER — SODIUM CHLORIDE 0.9% FLUSH
10.0000 mL | INTRAVENOUS | Status: DC | PRN
  Administered 2019-04-25: 13:00:00 10 mL
  Filled 2019-04-25: qty 10

## 2019-05-06 ENCOUNTER — Inpatient Hospital Stay: Payer: Medicare Other

## 2019-05-06 ENCOUNTER — Encounter: Payer: Self-pay | Admitting: Nurse Practitioner

## 2019-05-06 ENCOUNTER — Inpatient Hospital Stay (HOSPITAL_BASED_OUTPATIENT_CLINIC_OR_DEPARTMENT_OTHER): Payer: Medicare Other | Admitting: Nurse Practitioner

## 2019-05-06 ENCOUNTER — Encounter (INDEPENDENT_AMBULATORY_CARE_PROVIDER_SITE_OTHER): Payer: BC Managed Care – PPO | Admitting: Ophthalmology

## 2019-05-06 ENCOUNTER — Other Ambulatory Visit: Payer: Self-pay

## 2019-05-06 VITALS — BP 139/92 | HR 86 | Temp 97.8°F | Resp 18 | Ht 76.0 in | Wt 213.2 lb

## 2019-05-06 DIAGNOSIS — Z7189 Other specified counseling: Secondary | ICD-10-CM

## 2019-05-06 DIAGNOSIS — Z5111 Encounter for antineoplastic chemotherapy: Secondary | ICD-10-CM | POA: Diagnosis not present

## 2019-05-06 DIAGNOSIS — C250 Malignant neoplasm of head of pancreas: Secondary | ICD-10-CM

## 2019-05-06 DIAGNOSIS — C252 Malignant neoplasm of tail of pancreas: Secondary | ICD-10-CM | POA: Diagnosis not present

## 2019-05-06 DIAGNOSIS — Z95828 Presence of other vascular implants and grafts: Secondary | ICD-10-CM

## 2019-05-06 LAB — CBC WITH DIFFERENTIAL (CANCER CENTER ONLY)
Abs Immature Granulocytes: 0.02 10*3/uL (ref 0.00–0.07)
Basophils Absolute: 0.1 10*3/uL (ref 0.0–0.1)
Basophils Relative: 1 %
Eosinophils Absolute: 0.2 10*3/uL (ref 0.0–0.5)
Eosinophils Relative: 2 %
HCT: 33.7 % — ABNORMAL LOW (ref 39.0–52.0)
Hemoglobin: 10.8 g/dL — ABNORMAL LOW (ref 13.0–17.0)
Immature Granulocytes: 0 %
Lymphocytes Relative: 11 %
Lymphs Abs: 0.8 10*3/uL (ref 0.7–4.0)
MCH: 35.6 pg — ABNORMAL HIGH (ref 26.0–34.0)
MCHC: 32 g/dL (ref 30.0–36.0)
MCV: 111.2 fL — ABNORMAL HIGH (ref 80.0–100.0)
Monocytes Absolute: 0.6 10*3/uL (ref 0.1–1.0)
Monocytes Relative: 8 %
Neutro Abs: 5.3 10*3/uL (ref 1.7–7.7)
Neutrophils Relative %: 78 %
Platelet Count: 94 10*3/uL — ABNORMAL LOW (ref 150–400)
RBC: 3.03 MIL/uL — ABNORMAL LOW (ref 4.22–5.81)
RDW: 14 % (ref 11.5–15.5)
WBC Count: 6.9 10*3/uL (ref 4.0–10.5)
nRBC: 0 % (ref 0.0–0.2)

## 2019-05-06 LAB — CMP (CANCER CENTER ONLY)
ALT: 26 U/L (ref 0–44)
AST: 18 U/L (ref 15–41)
Albumin: 3.6 g/dL (ref 3.5–5.0)
Alkaline Phosphatase: 120 U/L (ref 38–126)
Anion gap: 8 (ref 5–15)
BUN: 11 mg/dL (ref 8–23)
CO2: 26 mmol/L (ref 22–32)
Calcium: 8.8 mg/dL — ABNORMAL LOW (ref 8.9–10.3)
Chloride: 108 mmol/L (ref 98–111)
Creatinine: 0.81 mg/dL (ref 0.61–1.24)
GFR, Est AFR Am: >60 mL/min (ref >60)
GFR, Estimated: >60 mL/min (ref >60)
Glucose, Bld: 162 mg/dL — ABNORMAL HIGH (ref 70–99)
Potassium: 4.2 mmol/L (ref 3.5–5.1)
Sodium: 142 mmol/L (ref 135–145)
Total Bilirubin: 0.4 mg/dL (ref 0.3–1.2)
Total Protein: 6.3 g/dL — ABNORMAL LOW (ref 6.5–8.1)

## 2019-05-06 MED ORDER — SODIUM CHLORIDE 0.9% FLUSH
10.0000 mL | INTRAVENOUS | Status: DC | PRN
  Filled 2019-05-06: qty 10

## 2019-05-06 MED ORDER — OXALIPLATIN CHEMO INJECTION 100 MG/20ML
62.0000 mg/m2 | Freq: Once | INTRAVENOUS | Status: AC
  Administered 2019-05-06: 150 mg via INTRAVENOUS
  Filled 2019-05-06: qty 20

## 2019-05-06 MED ORDER — IRINOTECAN HCL CHEMO INJECTION 100 MG/5ML
130.0000 mg/m2 | Freq: Once | INTRAVENOUS | Status: AC
  Administered 2019-05-06: 320 mg via INTRAVENOUS
  Filled 2019-05-06: qty 15

## 2019-05-06 MED ORDER — PROCHLORPERAZINE MALEATE 10 MG PO TABS: 10.0000 mg | ORAL_TABLET | Freq: Four times a day (QID) | ORAL | 1 refills | Status: AC | PRN

## 2019-05-06 MED ORDER — ATROPINE SULFATE 0.4 MG/ML IJ SOLN
INTRAMUSCULAR | Status: AC
  Filled 2019-05-06: qty 1

## 2019-05-06 MED ORDER — ATROPINE SULFATE 0.4 MG/ML IJ SOLN
0.4000 mg | Freq: Once | INTRAMUSCULAR | Status: AC | PRN
  Administered 2019-05-06: 0.4 mg via INTRAVENOUS

## 2019-05-06 MED ORDER — DEXTROSE 5 % IV SOLN
Freq: Once | INTRAVENOUS | Status: AC
  Administered 2019-05-06: 11:00:00 via INTRAVENOUS
  Filled 2019-05-06: qty 250

## 2019-05-06 MED ORDER — PALONOSETRON HCL INJECTION 0.25 MG/5ML
INTRAVENOUS | Status: AC
  Filled 2019-05-06: qty 5

## 2019-05-06 MED ORDER — HEPARIN SOD (PORK) LOCK FLUSH 100 UNIT/ML IV SOLN
500.0000 [IU] | Freq: Once | INTRAVENOUS | Status: DC | PRN
  Filled 2019-05-06: qty 5

## 2019-05-06 MED ORDER — SODIUM CHLORIDE 0.9% FLUSH
10.0000 mL | Freq: Once | INTRAVENOUS | Status: AC
  Administered 2019-05-06: 10 mL
  Filled 2019-05-06: qty 10

## 2019-05-06 MED ORDER — SODIUM CHLORIDE 0.9 % IV SOLN
2200.0000 mg/m2 | INTRAVENOUS | Status: DC
  Administered 2019-05-06: 5350 mg via INTRAVENOUS
  Filled 2019-05-06: qty 107

## 2019-05-06 MED ORDER — SODIUM CHLORIDE 0.9 % IV SOLN
Freq: Once | INTRAVENOUS | Status: AC
  Administered 2019-05-06: 12:00:00 via INTRAVENOUS
  Filled 2019-05-06: qty 5

## 2019-05-06 MED ORDER — PALONOSETRON HCL INJECTION 0.25 MG/5ML
0.2500 mg | Freq: Once | INTRAVENOUS | Status: AC
  Administered 2019-05-06: 11:00:00 0.25 mg via INTRAVENOUS

## 2019-05-06 MED ORDER — LEUCOVORIN CALCIUM INJECTION 350 MG
400.0000 mg/m2 | Freq: Once | INTRAVENOUS | Status: AC
  Administered 2019-05-06: 976 mg via INTRAVENOUS
  Filled 2019-05-06: qty 48.8

## 2019-05-06 NOTE — Progress Notes (Signed)
Per Cira Rue okay to treat with plt 94

## 2019-05-06 NOTE — Patient Instructions (Signed)

## 2019-05-06 NOTE — Progress Notes (Signed)
Vincent [name]   [phone] Fax:(336) [phone]   Clinic Follow up Note   Patient Care Team: Leonard Downing, MD as PCP - General (Family Medicine) 05/06/2019  CHIEF COMPLAINT: Follow-up pancreas cancer  SUMMARY OF ONCOLOGIC HISTORY: Oncology History Overview Note  Cancer Staging Pancreatic cancer [facility]) Staging form: Exocrine Pancreas, AJCC 8th Edition - Clinical stage from 10/29/2018: Stage IV (cT3, cN0, cM1) - Signed by Truitt Merle, MD on 11/05/2018     Pancreatic cancer ([place])  10/09/2018 Procedure   Upper Endoscopy by. Dr Paulita Fujita 10/29/18 IMPRESSION - There was no evidence of significant pathology in the left lobe of the liver. - There was no sign of significant pathology in the gallbladder. - Many abnormal lymph nodes were visualized in the splenic region (level 19), perigastric region and peripancreatic region. - A mass was identified in the pancreatic tail. This was staged T4 N2 Mx by endosonographic criteria. Fine needle aspiration performed.   10/10/2018 Imaging   CT Abdomen 10/10/18 IMPRESSION: 1. Uncomplicated acute diverticulitis of the proximal sigmoid colon. 2. Ill-defined low-density in the pancreatic tail measuring at least 4.7 cm, with possible punctate peripheral calcifications. This is suspicious for pancreatic neoplasm, however sequela of prior pancreatitis is also considered. Recommend further characterization with pancreatic protocol MRI when patient is able to tolerate breath hold technique. 3. 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 4. 16 mm cyst in the left lobe of the liver. Aortic Atherosclerosis (ICD10-I70.0).   10/16/2018 Imaging   MRI Abdomen at [facility] 10/16/18 IMPRESSION: 1. Mass in the tail the pancreas concerning for neoplasm. This contacts the spleen in the splenic hilum. 2. There is stranding in the fat extending from the tumor to the celiac axis and  left adrenal gland. Cannot exclude infiltrating tumor. 3. 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 4. metastatic lesion in the liver   10/29/2018 Initial Biopsy   Diagnosis 10/29/18 FINE NEEDLE ASPIRATION, ENDOSCOPIC, PANCREAS TAIL(SPECIMEN 1 OF 1 COLLECTED 10/29/18): MALIGNANT CELLS PRESENT, CONSISTENT WITH ADENOCARCINOMA. SEE COMMENT. COMMENT: THERE IS INSUFFICIENT TUMOR PRESENT FOR ADDITIONAL STUDIES.   10/29/2018 Cancer Staging   Staging form: Exocrine Pancreas, AJCC 8th Edition - Clinical stage from 10/29/2018: Stage IV (cT3, cN0, cM1) - Signed by Truitt Merle, MD on 11/05/2018   11/04/2018 Initial Diagnosis   Pancreatic cancer ([place])   11/27/2018 Imaging   CT Chest  IMPRESSION: 1.  No acute process or evidence of metastatic disease in the chest. 2. Locally advanced pancreatic carcinoma, as detailed previously. Cannot exclude superimposed pancreatitis. 3. New and enlarging liver lesions, suspicious for hepatic metastasis. 4. Aortic atherosclerosis (ICD10-I70.0), coronary artery atherosclerosis and emphysema (ICD10-J43.9).   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  The Common Hereditary Gene Panel offered by [facility] includes sequencing and/or deletion duplication testing of the following 48 genes: APC, ATM, AXIN2, BARD1, BMPR1A, BRCA1, BRCA2, BRIP1, CDH1, CDK4, CDKN2A (p14ARF), CDKN2A (p16INK4a), CHEK2, CTNNA1, DICER1, EPCAM (Deletion/duplication testing only), GREM1 (promoter region deletion/duplication testing only), KIT, MEN1, MLH1, MSH2, MSH3, MSH6, MUTYH, NBN, NF1, NHTL1, PALB2, PDGFRA, PMS2, POLD1, POLE, PTEN, RAD50, RAD51C, RAD51D, RNF43, SDHB, SDHC, SDHD, SMAD4, SMARCA4. STK11, TP53, TSC1, TSC2, and VHL.  The following genes were evaluated for sequence changes only: SDHA and  HOXB13 c.251G>A variant only. The report date is December 04, 2018.,   02/11/2019 Imaging   CT AP W Contrast  IMPRESSION: 1. Stable appearing ill-defined pancreatic mass in the tail region extending into the splenic hilum. 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 2. The left hepatic lobe metastatic lesion is slightly larger and the right hepatic lobe lesion is slightly smaller. No new lesions are identified. 3. Small peripancreatic lymph nodes are stable. No retroperitoneal lymphadenopathy. 4. No worrisome pulmonary nodules at the lung bases and no worrisome bone lesions.     CURRENT THERAPY: First lineFOLFIRINOX q2weeks starting4/23/20. Reduced dose of oxaliplatin starting with cycle 9 due to mild thrombocytopenia andtolerance issue. Reduced dose 5FU and irinotecan with cycle 10 due to further thrombocytopenia and neuropathy   INTERVAL HISTORY: Vincent [name] returns for follow-up and treatment as scheduled.  He completed cycle 10 on 04/23/2019. He has watery stool for few days after treatment, controlled with imodium. He returns to his normal self few days after pump d/c. Appetite and energy level are good.  Has less tingling and "fidgety" legs on gabapentin, also sleeping well. He takes 1 capsule qHS. Denies mucositis, fever, chills, cough, chest pain, dyspnea, leg swelling, abdominal pain, or bleeding.    MEDICAL HISTORY:  Past Medical History:  Diagnosis Date   Diabetes mellitus without complication ([place])    pt. states that he is not diabeticalthought he takes metformin   Eye hemorrhage    right   Family history of lung cancer    Hypertension    Monoallelic mutation of BRIP1 gene    Monoallelic mutation of MUTYH gene     SURGICAL HISTORY: Past Surgical History:  Procedure Laterality Date   ESOPHAGOGASTRODUODENOSCOPY (EGD) WITH PROPOFOL N/A 10/29/2018   Procedure: ESOPHAGOGASTRODUODENOSCOPY (EGD) WITH PROPOFOL;  Surgeon:  Arta Silence, MD;  Location: [facility];  Service: Endoscopy;  Laterality: N/A;   EUS N/A 10/29/2018   Procedure: FULL UPPER ENDOSCOPIC ULTRASOUND (EUS) RADIAL;  Surgeon: Arta Silence, MD;  Location: [facility];  Service: Endoscopy;  Laterality: N/A;   FINE NEEDLE ASPIRATION N/A 10/29/2018   Procedure: FINE NEEDLE ASPIRATION (FNA) LINEAR;  Surgeon: Arta Silence, MD;  Location: [facility];  Service: Endoscopy;  Laterality: N/A;   IR IMAGING GUIDED PORT INSERTION  11/11/2018   LUMBAR LAMINECTOMY/DECOMPRESSION MICRODISCECTOMY Left 10/11/2017   Procedure: Microlumbar decompression Lumbar Four-Five Left;  Surgeon: Susa Day, MD;  Location: [place];  Service: Orthopedics;  Laterality: Left;  Microlumbar decompression Lumbar Four-Five Left   removal of wisdom teeth  1977    I have reviewed the social history and family history with the patient and they are unchanged from previous note.  ALLERGIES:  has No Known Allergies.  MEDICATIONS:  Current Outpatient Medications  Medication Sig Dispense Refill   Accu-Chek FastClix Lancets MISC daily. for testing     aspirin EC 81 MG tablet Take 1 tablet (81 mg total) by mouth daily after breakfast. Resume 4 days post-op     gabapentin (NEURONTIN) 100 MG capsule Take 1 capsule (100 mg total) by mouth at bedtime. 30 capsule 1   Ginger, Zingiber officinalis, (GINGER ROOT PO) Take 750 mg by mouth.     KLOR-CON M20 20 MEQ tablet TAKE 1 TABLET (20 MEQ TOTAL) BY MOUTH EVERY OTHER DAY. 30 tablet 0   lidocaine-prilocaine (EMLA) cream Apply to affected area once 30 g 3   lisinopril (PRINIVIL,ZESTRIL) 10 MG tablet Take 10 mg by mouth daily after breakfast.     metFORMIN (GLUCOPHAGE) 1000 MG tablet TAKE 1  TABLET BY MOUTH AT BREAKFAST AND 1/2 TABLET AT SUPPER     prochlorperazine (COMPAZINE) 10 MG tablet Take 1 tablet (10 mg total) by mouth every 6 (six) hours as needed (NAUSEA). 30 tablet 1   simvastatin (ZOCOR) 40 MG tablet Take 20 mg by  mouth every evening.     traMADol (ULTRAM) 50 MG tablet Take 2 tablets (100 mg total) by mouth every 6 (six) hours as needed. 60 tablet 0   ondansetron (ZOFRAN) 8 MG tablet Take 1 tablet (8 mg total) by mouth 2 (two) times daily as needed for refractory nausea / vomiting. Start on day 3 after chemotherapy. (Patient not taking: Reported on 05/06/2019) 30 tablet 1   No current facility-administered medications for this visit.    Facility-Administered Medications Ordered in Other Visits  Medication Dose Route Frequency Provider Last Rate Last Dose   fluorouracil (ADRUCIL) 5,350 mg in sodium chloride 0.9 % 143 mL chemo infusion  2,200 mg/m2 (Treatment Plan Recorded) Intravenous 1 day or 1 dose Truitt Merle, MD       fosaprepitant (EMEND) 150 mg, dexamethasone (DECADRON) 10 mg in sodium chloride 0.9 % 145 mL IVPB   Intravenous Once Truitt Merle, MD       heparin lock flush 100 unit/mL  500 Units Intracatheter Once PRN Truitt Merle, MD       irinotecan (CAMPTOSAR) 320 mg in dextrose 5 % 500 mL chemo infusion  130 mg/m2 (Treatment Plan Recorded) Intravenous Once Truitt Merle, MD       leucovorin 976 mg in dextrose 5 % 250 mL infusion  400 mg/m2 (Treatment Plan Recorded) Intravenous Once Truitt Merle, MD       oxaliplatin (ELOXATIN) 150 mg in dextrose 5 % 500 mL chemo infusion  62 mg/m2 (Treatment Plan Recorded) Intravenous Once Truitt Merle, MD       sodium chloride flush (NS) 0.9 % injection 10 mL  10 mL Intracatheter PRN Truitt Merle, MD        PHYSICAL EXAMINATION: ECOG PERFORMANCE STATUS: 1 - Symptomatic but completely ambulatory  Vitals:   05/06/19 1022  BP: (!) 139/92  Pulse: 86  Resp: 18  Temp: 97.8 F (36.6 C)  SpO2: 98%   Filed Weights   05/06/19 1022  Weight: 213 lb 3.2 oz (96.7 kg)    GENERAL:alert, no distress and comfortable SKIN: no rash. Few small petechia to arms/wrist bilaterally   EYES:  sclera clear LUNGS: clear with normal breathing effort HEART: regular rate & rhythm, no lower  extremity edema ABDOMEN: abdomen soft, non-tender and normal bowel sounds Musculoskeletal:no cyanosis of digits NEURO: alert & oriented x 3 with fluent speech, normal gait PAC without erythema   LABORATORY DATA:  I have reviewed the data as listed CBC Latest Ref Rng & Units 05/06/2019 04/23/2019 04/09/2019  WBC 4.0 - 10.5 K/uL 6.9 5.1 5.3  Hemoglobin 13.0 - 17.0 g/dL 10.8(L) 10.4(L) 10.6(L)  Hematocrit 39.0 - 52.0 % 33.7(L) 32.3(L) 32.6(L)  Platelets 150 - 400 K/uL 94(L) 98(L) 128(L)     CMP Latest Ref Rng & Units 05/06/2019 04/23/2019 04/09/2019  Glucose 70 - 99 mg/dL 162(H) 146(H) 136(H)  BUN 8 - 23 mg/dL '11 12 13  ' Creatinine 0.61 - 1.24 mg/dL 0.81 0.93 0.81  Sodium 135 - 145 mmol/L 142 142 139  Potassium 3.5 - 5.1 mmol/L 4.2 4.3 4.0  Chloride 98 - 111 mmol/L 108 108 106  CO2 22 - 32 mmol/L '26 26 26  ' Calcium 8.9 - 10.3 mg/dL 8.8(L) 8.5(L) 8.6(L)  Total Protein 6.5 - 8.1 g/dL 6.3(L) 6.1(L) 6.3(L)  Total Bilirubin 0.3 - 1.2 mg/dL 0.4 0.3 0.4  Alkaline Phos 38 - 126 U/L 120 113 97  AST 15 - 41 U/L '18 22 24  ' ALT 0 - 44 U/L 26 30 32      RADIOGRAPHIC STUDIES: I have personally reviewed the radiological images as listed and agreed with the findings in the report. No results found.   ASSESSMENT & PLAN: Vincent Sperbeck Caseyis a 68 y.o.malewith   1. Adenocarcinoma oftail of Pancreas, cT3N0Mx, with liver metastasis -diagnosedin3/2020. CT APand abdominal MRI showed a 1.6cm lesion in thedorm, althoughit appears likely a benign cyst on the CT scan, however the MRI features are concerning for livermetastasis.due to [place], liver biopsy was not done  -He started first-line FOLFIRINOX every 2 weeks on 11/28/18. Heistolerating welloverallwith fatigue andfluctuatingappetite and cold sensitivity, intermittent neuropathy s/p C6 and mild diarrhea.  -He has had 2 episodes of dizziness and syncope upon standing with cycle 4. He previously received IV fluids to pump d/c -S/p cycle  5CT AP from7/7/20 showsoverall mixed response/stable disease; treatment was continued  -he currently tolerates treatment well with good PS -Oxaliplatin was dose reduced with cycle 9 due to mild neuropathy and thrombocytopenia; neuropathy improved with addition of gabapentin on 04/23/19 -irinotecan and 5FU dose reduced with cycle 10 for worsening thrombocytopenia   2. Epigastricand LUQpainradiating to his back -secondary topancreaticcancer -Abdominal painhasmostrevolvedsince he started chemo, backand musclepain manageable with tramadolfor significant pain every 2-3 days if needed.He takes Tylenol for mild to moderate pain.He is not taking norco or oxycodone. Stable -seldom takes pain medication  3. Lower appetite, Weight loss  -f/u with dietician -weight fluctuates but overall stable over last couple months   4. DM, HTN  -On Metformin, has not taken Lisinopril in over a month -BP 116/89 today, stable   5. Hypokalemia  -continue 20 mEq supplement daily   6. 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   Disposition: Vincent [name] appears stable. He completed 10 cycles of dose-reduced FOLFIRINOX. He tolerates chemotherapy well with mild diarrhea that is well controlled with imodium. Peripheral neuropathy is improved on gabapentin. Labs reviewed, CMP and CBC are stable. PLT 94K, no bleeding. Proceed with cycle 11 FOLFIRINOX at current reduced doses. He will undergo restaging CT AP on 10/12, f/u 10/14.   All questions were answered. The patient knows to call the clinic with any problems, questions or concerns. No barriers to learning was detected.     Alla Feeling, NP 05/06/19

## 2019-05-06 NOTE — Patient Instructions (Signed)
[facility] Discharge Instructions for Patients Receiving Chemotherapy  Today you received the following chemotherapy agents Oxaliplatin, Irinotecan, Leucovorin, and 5FU  To help prevent nausea and vomiting after your treatment, we encourage you to take your nausea medication as directed   If you develop nausea and vomiting that is not controlled by your nausea medication, call the clinic.   BELOW ARE SYMPTOMS THAT SHOULD BE REPORTED IMMEDIATELY:  *FEVER GREATER THAN 100.5 F  *CHILLS WITH OR WITHOUT FEVER  NAUSEA AND VOMITING THAT IS NOT CONTROLLED WITH YOUR NAUSEA MEDICATION  *UNUSUAL SHORTNESS OF BREATH  *UNUSUAL BRUISING OR BLEEDING  TENDERNESS IN MOUTH AND THROAT WITH OR WITHOUT PRESENCE OF ULCERS  *URINARY PROBLEMS  *BOWEL PROBLEMS  UNUSUAL RASH Items with * indicate a potential emergency and should be followed up as soon as possible.  Feel free to call the clinic should you have any questions or concerns. The clinic phone number is [phone].  Please show the CHEMO ALERT CARD at check-in to the Emergency Department and triage nurse.   

## 2019-05-07 ENCOUNTER — Telehealth: Payer: Self-pay | Admitting: Nurse Practitioner

## 2019-05-07 NOTE — Telephone Encounter (Signed)
No los per 9/29. °

## 2019-05-08 ENCOUNTER — Inpatient Hospital Stay: Payer: Medicare Other | Attending: Hematology

## 2019-05-08 ENCOUNTER — Other Ambulatory Visit: Payer: Self-pay

## 2019-05-08 VITALS — BP 119/78 | HR 86 | Temp 98.5°F | Resp 18

## 2019-05-08 DIAGNOSIS — E876 Hypokalemia: Secondary | ICD-10-CM | POA: Diagnosis not present

## 2019-05-08 DIAGNOSIS — Z7189 Other specified counseling: Secondary | ICD-10-CM

## 2019-05-08 DIAGNOSIS — C252 Malignant neoplasm of tail of pancreas: Secondary | ICD-10-CM | POA: Insufficient documentation

## 2019-05-08 DIAGNOSIS — C787 Secondary malignant neoplasm of liver and intrahepatic bile duct: Secondary | ICD-10-CM | POA: Insufficient documentation

## 2019-05-08 DIAGNOSIS — L03031 Cellulitis of right toe: Secondary | ICD-10-CM | POA: Insufficient documentation

## 2019-05-08 DIAGNOSIS — T451X5A Adverse effect of antineoplastic and immunosuppressive drugs, initial encounter: Secondary | ICD-10-CM | POA: Insufficient documentation

## 2019-05-08 DIAGNOSIS — Z79899 Other long term (current) drug therapy: Secondary | ICD-10-CM | POA: Diagnosis not present

## 2019-05-08 DIAGNOSIS — Z7984 Long term (current) use of oral hypoglycemic drugs: Secondary | ICD-10-CM | POA: Insufficient documentation

## 2019-05-08 DIAGNOSIS — I10 Essential (primary) hypertension: Secondary | ICD-10-CM | POA: Diagnosis not present

## 2019-05-08 DIAGNOSIS — D696 Thrombocytopenia, unspecified: Secondary | ICD-10-CM | POA: Diagnosis not present

## 2019-05-08 DIAGNOSIS — Z5111 Encounter for antineoplastic chemotherapy: Secondary | ICD-10-CM | POA: Insufficient documentation

## 2019-05-08 DIAGNOSIS — E1140 Type 2 diabetes mellitus with diabetic neuropathy, unspecified: Secondary | ICD-10-CM | POA: Diagnosis not present

## 2019-05-08 MED ORDER — SODIUM CHLORIDE 0.9% FLUSH
10.0000 mL | INTRAVENOUS | Status: DC | PRN
  Administered 2019-05-08: 10 mL
  Filled 2019-05-08: qty 10

## 2019-05-08 MED ORDER — PEGFILGRASTIM-CBQV 6 MG/0.6ML ~~LOC~~ SOSY
PREFILLED_SYRINGE | SUBCUTANEOUS | Status: AC
  Filled 2019-05-08: qty 0.6

## 2019-05-08 MED ORDER — HEPARIN SOD (PORK) LOCK FLUSH 100 UNIT/ML IV SOLN
500.0000 [IU] | Freq: Once | INTRAVENOUS | Status: AC | PRN
  Administered 2019-05-08: 500 [IU]
  Filled 2019-05-08: qty 5

## 2019-05-08 MED ORDER — PEGFILGRASTIM-CBQV 6 MG/0.6ML ~~LOC~~ SOSY
6.0000 mg | PREFILLED_SYRINGE | Freq: Once | SUBCUTANEOUS | Status: AC
  Administered 2019-05-08: 6 mg via SUBCUTANEOUS

## 2019-05-08 NOTE — Patient Instructions (Addendum)
Pegfilgrastim injection What is this medicine? PEGFILGRASTIM (PEG fil gra stim) is a long-acting granulocyte colony-stimulating factor that stimulates the growth of neutrophils, a type of white blood cell important in the body's fight against infection. It is used to reduce the incidence of fever and infection in patients with certain types of cancer who are receiving chemotherapy that affects the bone marrow, and to increase survival after being exposed to high doses of radiation. This medicine may be used for other purposes; ask your health care provider or pharmacist if you have questions. COMMON BRAND NAME(S): Fulphila, Neulasta, UDENYCA, Ziextenzo What should I tell my health care provider before I take this medicine? They need to know if you have any of these conditions:  kidney disease  latex allergy  ongoing radiation therapy  sickle cell disease  skin reactions to acrylic adhesives (On-Body Injector only)  an unusual or allergic reaction to pegfilgrastim, filgrastim, other medicines, foods, dyes, or preservatives  pregnant or trying to get pregnant  breast-feeding How should I use this medicine? This medicine is for injection under the skin. If you get this medicine at home, you will be taught how to prepare and give the pre-filled syringe or how to use the On-body Injector. Refer to the patient Instructions for Use for detailed instructions. Use exactly as directed. Tell your healthcare provider immediately if you suspect that the On-body Injector may not have performed as intended or if you suspect the use of the On-body Injector resulted in a missed or partial dose. It is important that you put your used needles and syringes in a special sharps container. Do not put them in a trash can. If you do not have a sharps container, call your pharmacist or healthcare provider to get one. Talk to your pediatrician regarding the use of this medicine in children. While this drug may be  prescribed for selected conditions, precautions do apply. Overdosage: If you think you have taken too much of this medicine contact a poison control center or emergency room at once. NOTE: This medicine is only for you. Do not share this medicine with others. What if I miss a dose? It is important not to miss your dose. Call your doctor or health care professional if you miss your dose. If you miss a dose due to an On-body Injector failure or leakage, a new dose should be administered as soon as possible using a single prefilled syringe for manual use. What may interact with this medicine? Interactions have not been studied. Give your health care provider a list of all the medicines, herbs, non-prescription drugs, or dietary supplements you use. Also tell them if you smoke, drink alcohol, or use illegal drugs. Some items may interact with your medicine. This list may not describe all possible interactions. Give your health care provider a list of all the medicines, herbs, non-prescription drugs, or dietary supplements you use. Also tell them if you smoke, drink alcohol, or use illegal drugs. Some items may interact with your medicine. What should I watch for while using this medicine? You may need blood work done while you are taking this medicine. If you are going to need a MRI, CT scan, or other procedure, tell your doctor that you are using this medicine (On-Body Injector only). What side effects may I notice from receiving this medicine? Side effects that you should report to your doctor or health care professional as soon as possible:  allergic reactions like skin rash, itching or hives, swelling of the   face, lips, or tongue  back pain  dizziness  fever  pain, redness, or irritation at site where injected  pinpoint red spots on the skin  red or dark-brown urine  shortness of breath or breathing problems  stomach or side pain, or pain at the  shoulder  swelling  tiredness  trouble passing urine or change in the amount of urine Side effects that usually do not require medical attention (report to your doctor or health care professional if they continue or are bothersome):  bone pain  muscle pain This list may not describe all possible side effects. Call your doctor for medical advice about side effects. You may report side effects to FDA at 1-800-FDA-1088. Where should I keep my medicine? Keep out of the reach of children. If you are using this medicine at home, you will be instructed on how to store it. Throw away any unused medicine after the expiration date on the label. NOTE: This sheet is a summary. It may not cover all possible information. If you have questions about this medicine, talk to your doctor, pharmacist, or health care provider.  2020 Elsevier/Gold Standard (2017-10-29 16:57:08)   Central Line, Adult A central line is a thin, flexible tube (catheter) that is put in your vein. It can be used to:  Give you medicine.  Give you food and nutrients. Follow these instructions at home: Caring for the tube   Follow instructions from your doctor about: ? Flushing the tube with saline solution. ? Cleaning the tube and the area around it.  Only flush with clean (sterile) supplies. The supplies should be from your doctor, a pharmacy, or another place that your doctor recommends.  Before you flush the tube or clean the area around the tube: ? Wash your hands with soap and water. If you cannot use soap and water, use hand sanitizer. ? Clean the central line hub with rubbing alcohol. Caring for your skin  Keep the area where the tube was put in clean and dry.  Every day, and when changing the bandage, check the skin around the central line for: ? Redness, swelling, or pain. ? Fluid or blood. ? Warmth. ? Pus. ? A bad smell. General instructions  Keep the tube clamped, unless it is being used.  Keep  your supplies in a clean, dry location.  If you or someone else accidentally pulls on the tube, make sure: ? The bandage (dressing) is okay. ? There is no bleeding. ? The tube has not been pulled out.  Do not use scissors or sharp objects near the tube.  Do not swim or let the tube soak in a tub.  Ask your doctor what activities are safe for you. Your doctor may tell you not to lift anything or move your arm too much.  Take over-the-counter and prescription medicines only as told by your doctor.  Change bandages as told by your doctor.  Keep your bandage dry. If a bandage gets wet, have it changed right away.  Keep all follow-up visits as told by your doctor. This is important. Throwing away supplies  Throw away any syringes in a trash (disposal) container that is only for sharp items (sharps container). You can buy a sharps container from a pharmacy, or you can make one by using an empty hard plastic bottle with a cover.  Place any used bandages or infusion bags into a plastic bag. Throw that bag in the trash. Contact a doctor if:  You have any   of these where the tube was put in: ? Redness, swelling, or pain. ? Fluid or blood. ? A warm feeling. ? Pus or a bad smell. Get help right away if:  You have: ? A fever. ? Chills. ? Trouble getting enough air (shortness of breath). ? Trouble breathing. ? Pain in your chest. ? Swelling in your neck, face, chest, or arm.  You are coughing.  You feel your heart beating fast or skipping beats.  You feel dizzy or you pass out (faint).  There are red lines coming from where the tube was put in.  The area where the tube was put in is bleeding and the bleeding will not stop.  Your tube is hard to flush.  You do not get a blood return from the tube.  The tube gets loose or comes out.  The tube has a hole or a tear.  The tube leaks. Summary  A central line is a thin, flexible tube (catheter) that is put in your vein. It can be used to take blood for lab tests or  to give you medicine.  Follow instructions from your doctor about flushing and cleaning the tube.  Keep the area where the tube was put in clean and dry.  Ask your doctor what activities are safe for you. This information is not intended to replace advice given to you by your health care provider. Make sure you discuss any questions you have with your health care provider. Document Released: 07/10/2012 Document Revised: 11/13/2018 Document Reviewed: 08/10/2016 Elsevier Patient Education  2020 Elsevier Inc.  

## 2019-05-12 ENCOUNTER — Encounter (INDEPENDENT_AMBULATORY_CARE_PROVIDER_SITE_OTHER): Payer: Medicare Other | Admitting: Ophthalmology

## 2019-05-12 ENCOUNTER — Other Ambulatory Visit: Payer: Self-pay

## 2019-05-12 DIAGNOSIS — I10 Essential (primary) hypertension: Secondary | ICD-10-CM

## 2019-05-12 DIAGNOSIS — H34811 Central retinal vein occlusion, right eye, with macular edema: Secondary | ICD-10-CM | POA: Diagnosis not present

## 2019-05-12 DIAGNOSIS — H34231 Retinal artery branch occlusion, right eye: Secondary | ICD-10-CM | POA: Diagnosis not present

## 2019-05-12 DIAGNOSIS — H35033 Hypertensive retinopathy, bilateral: Secondary | ICD-10-CM | POA: Diagnosis not present

## 2019-05-12 DIAGNOSIS — H43813 Vitreous degeneration, bilateral: Secondary | ICD-10-CM

## 2019-05-13 ENCOUNTER — Other Ambulatory Visit: Payer: Self-pay | Admitting: Nurse Practitioner

## 2019-05-13 ENCOUNTER — Telehealth: Payer: Self-pay

## 2019-05-13 MED ORDER — GABAPENTIN 100 MG PO CAPS: 100.0000 mg | ORAL_CAPSULE | Freq: Every day | ORAL | 1 refills | Status: DC

## 2019-05-13 NOTE — Telephone Encounter (Signed)
TC from Pt requesting new prescription for Gabapentin. Pt stated  He was told by Cira Rue NP that he could take more then one tablet. Per Pt pharmacy will not renew prescription with out new  Prescription stating the change, Lacie informed prescription changed.

## 2019-05-16 NOTE — Progress Notes (Signed)
Vincent [name]   [phone] [phone]   Clinic Follow up Note   Patient Care Team: Leonard Downing, MD as PCP - General (Family Medicine)  Date of Service:  05/21/2019  CHIEF COMPLAINT: F/u of metastatic pancreatic Cancer  SUMMARY OF ONCOLOGIC HISTORY: Oncology History Overview Note  Cancer Staging Pancreatic cancer [facility]) Staging form: Exocrine Pancreas, AJCC 8th Edition - Clinical stage from 10/29/2018: Stage IV (cT3, cN0, cM1) - Signed by Truitt Merle, MD on 11/05/2018     Pancreatic cancer ([place])  10/09/2018 Procedure   Upper Endoscopy by. Dr Paulita Fujita 10/29/18 IMPRESSION - There was no evidence of significant pathology in the left lobe of the liver. - There was no sign of significant pathology in the gallbladder. - Many abnormal lymph nodes were visualized in the splenic region (level 19), perigastric region and peripancreatic region. - A mass was identified in the pancreatic tail. This was staged T4 N2 Mx by endosonographic criteria. Fine needle aspiration performed.   10/10/2018 Imaging   CT Abdomen 10/10/18 IMPRESSION: 1. Uncomplicated acute diverticulitis of the proximal sigmoid colon. 2. Ill-defined low-density in the pancreatic tail measuring at least 4.7 cm, with possible punctate peripheral calcifications. This is suspicious for pancreatic neoplasm, however sequela of prior pancreatitis is also considered. Recommend further characterization with pancreatic protocol MRI when patient is able to tolerate breath hold technique. 3. 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 4. 16 mm cyst in the left lobe of the liver. Aortic Atherosclerosis (ICD10-I70.0).   10/16/2018 Imaging   MRI Abdomen at [facility] 10/16/18 IMPRESSION: 1. Mass in the tail the pancreas concerning for neoplasm. This contacts the spleen in the splenic hilum. 2. There is stranding in the fat extending from  the tumor to the celiac axis and left adrenal gland. Cannot exclude infiltrating tumor. 3. 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 4. metastatic lesion in the liver   10/29/2018 Initial Biopsy   Diagnosis 10/29/18 FINE NEEDLE ASPIRATION, ENDOSCOPIC, PANCREAS TAIL(SPECIMEN 1 OF 1 COLLECTED 10/29/18): MALIGNANT CELLS PRESENT, CONSISTENT WITH ADENOCARCINOMA. SEE COMMENT. COMMENT: THERE IS INSUFFICIENT TUMOR PRESENT FOR ADDITIONAL STUDIES.   10/29/2018 Cancer Staging   Staging form: Exocrine Pancreas, AJCC 8th Edition - Clinical stage from 10/29/2018: Stage IV (cT3, cN0, cM1) - Signed by Truitt Merle, MD on 11/05/2018   11/04/2018 Initial Diagnosis   Pancreatic cancer ([place])   11/27/2018 Imaging   CT Chest  IMPRESSION: 1.  No acute process or evidence of metastatic disease in the chest. 2. Locally advanced pancreatic carcinoma, as detailed previously. Cannot exclude superimposed pancreatitis. 3. New and enlarging liver lesions, suspicious for hepatic metastasis. 4. Aortic atherosclerosis (ICD10-I70.0), coronary artery atherosclerosis and emphysema (ICD10-J43.9).   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  The Common Hereditary Gene Panel offered by [facility] includes sequencing and/or deletion duplication testing of the following 48 genes: APC, ATM, AXIN2, BARD1, BMPR1A, BRCA1, BRCA2, BRIP1, CDH1, CDK4, CDKN2A (p14ARF), CDKN2A (p16INK4a), CHEK2, CTNNA1, DICER1, EPCAM (Deletion/duplication testing only), GREM1 (promoter region deletion/duplication testing only), KIT, MEN1, MLH1, MSH2, MSH3, MSH6, MUTYH, NBN, NF1, NHTL1, PALB2, PDGFRA, PMS2, POLD1, POLE, PTEN, RAD50, RAD51C, RAD51D, RNF43, SDHB, SDHC, SDHD, SMAD4, SMARCA4. STK11, TP53, TSC1, TSC2, and VHL.  The following genes were evaluated  for sequence changes only: SDHA and HOXB13 c.251G>A variant  only. The report date is December 04, 2018.,   02/11/2019 Imaging   CT AP W Contrast  IMPRESSION: 1. Stable appearing ill-defined pancreatic mass in the tail region extending into the splenic hilum. 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 2. The left hepatic lobe metastatic lesion is slightly larger and the right hepatic lobe lesion is slightly smaller. No new lesions are identified. 3. Small peripancreatic lymph nodes are stable. No retroperitoneal lymphadenopathy. 4. No worrisome pulmonary nodules at the lung bases and no worrisome bone lesions.   05/19/2019 Imaging   CT AP W Contrast  IMPRESSION: 4.5 cm pancreatic tail mass extending to the splenic hilum, mildly improved.   Improving hepatic metastases.   Stable abnormal soft tissue along the celiac axis, likely reflecting tumor.        CURRENT THERAPY:  First lineFOLFIRINOX q2weeks starting4/23/20. Reduced dose of Oxaliplatin starting with cycle 9 due to mild thrombocytopenia and tolerance issue. Further dose reduced with C12 due to neuropathy.   INTERVAL HISTORY:  Vincent [name] is here for a follow up and treatment. He presents to the clinic alone.  He notes his neuropathy, mainly tingling, was intermittent and now more constant and intense in feet and hand. He still has most hand dexterity. He notes cold sensitivity will go away the farther he gets from infusion. He notes he still is able to drink adequately. He notes something in right big toe which is causing infection. This has been going on for 1-2 weeks. He has tried neosporin.   For his birthday he plans to go to the beach around 10[place]28 and will take 2 weeks break off chemo.    REVIEW OF SYSTEMS:   Constitutional: Denies fevers, chills or abnormal weight loss Eyes: Denies blurriness of vision Ears, nose, mouth, throat, and face: Denies mucositis or  sore throat Respiratory: Denies cough, dyspnea or wheezes Cardiovascular: Denies palpitation, chest discomfort or lower extremity swelling Gastrointestinal:  Denies nausea, heartburn or change in bowel habits Skin: Denies abnormal skin rashes (+) Skin infection of right big toe  Lymphatics: Denies new lymphadenopathy or easy bruising Neurological: (+) Tingling worsened in feet and hands Behavioral/Psych: Mood is stable, no new changes  All other systems were reviewed with the patient and are negative.  MEDICAL HISTORY:  Past Medical History:  Diagnosis Date   Diabetes mellitus without complication ([place])    pt. states that he is not diabeticalthought he takes metformin   Eye hemorrhage    right   Family history of lung cancer    Hypertension    Monoallelic mutation of BRIP1 gene    Monoallelic mutation of MUTYH gene     SURGICAL HISTORY: Past Surgical History:  Procedure Laterality Date   ESOPHAGOGASTRODUODENOSCOPY (EGD) WITH PROPOFOL N/A 10/29/2018   Procedure: ESOPHAGOGASTRODUODENOSCOPY (EGD) WITH PROPOFOL;  Surgeon: Arta Silence, MD;  Location: [facility];  Service: Endoscopy;  Laterality: N/A;   EUS N/A 10/29/2018   Procedure: FULL UPPER ENDOSCOPIC ULTRASOUND (EUS) RADIAL;  Surgeon: Arta Silence, MD;  Location: [facility];  Service: Endoscopy;  Laterality: N/A;   FINE NEEDLE ASPIRATION N/A 10/29/2018   Procedure: FINE NEEDLE ASPIRATION (FNA) LINEAR;  Surgeon: Arta Silence, MD;  Location: [facility];  Service: Endoscopy;  Laterality: N/A;   IR IMAGING GUIDED PORT INSERTION  11/11/2018   LUMBAR LAMINECTOMY/DECOMPRESSION MICRODISCECTOMY Left 10/11/2017   Procedure: Microlumbar decompression Lumbar Four-Five Left;  Surgeon: Susa Day, MD;  Location: [facility];  Service: Orthopedics;  Laterality: Left;  Microlumbar decompression Lumbar Four-Five Left   removal of wisdom teeth  1977    I have reviewed the social history and family history with the patient and  they are unchanged from previous note.  ALLERGIES:  has No Known Allergies.  MEDICATIONS:  Current Outpatient Medications  Medication Sig Dispense Refill   Accu-Chek FastClix Lancets MISC daily. for testing     aspirin EC 81 MG tablet Take 1 tablet (81 mg total) by mouth daily after breakfast. Resume 4 days post-op     gabapentin (NEURONTIN) 100 MG capsule Take 1 capsule (100 mg total) by mouth at bedtime. May gradually increase to 300 mg at night (Patient taking differently: Take 200 mg by mouth at bedtime. May gradually increase to 300 mg at night) 90 capsule 1   Ginger, Zingiber officinalis, (GINGER ROOT PO) Take 750 mg by mouth.     KLOR-CON M20 20 MEQ tablet TAKE 1 TABLET (20 MEQ TOTAL) BY MOUTH EVERY OTHER DAY. 30 tablet 0   lidocaine-prilocaine (EMLA) cream Apply to affected area once 30 g 3   lisinopril (PRINIVIL,ZESTRIL) 10 MG tablet Take 10 mg by mouth daily after breakfast.     metFORMIN (GLUCOPHAGE) 1000 MG tablet TAKE 1 TABLET BY MOUTH AT BREAKFAST AND 1/2 TABLET AT SUPPER     ondansetron (ZOFRAN) 8 MG tablet Take 1 tablet (8 mg total) by mouth 2 (two) times daily as needed for refractory nausea / vomiting. Start on day 3 after chemotherapy. 30 tablet 1   prochlorperazine (COMPAZINE) 10 MG tablet Take 1 tablet (10 mg total) by mouth every 6 (six) hours as needed (NAUSEA). 30 tablet 1   simvastatin (ZOCOR) 40 MG tablet Take 20 mg by mouth every evening.     traMADol (ULTRAM) 50 MG tablet Take 2 tablets (100 mg total) by mouth every 6 (six) hours as needed. 60 tablet 0   cephALEXin (KEFLEX) 500 MG capsule Take 1 capsule (500 mg total) by mouth 3 (three) times daily. 21 capsule 0   No current facility-administered medications for this visit.     PHYSICAL EXAMINATION: ECOG PERFORMANCE STATUS: 1 - Symptomatic but completely ambulatory  Vitals:   05/21/19 1001  BP: (!) 141/93  Pulse: 72  Resp: 17  Temp: 98.5 F (36.9 C)  SpO2: 98%   Filed Weights   05/21/19  1001  Weight: 209 lb 14.4 oz (95.2 kg)    GENERAL:alert, no distress and comfortable SKIN: skin color, texture, turgor are normal, no rashes or significant lesions (+) Skin erythema around right big toe nail bed, no discharge  EYES: normal, Conjunctiva are pink and non-injected, sclera clear  NECK: supple, thyroid normal size, non-tender, without nodularity LYMPH:  no palpable lymphadenopathy in the cervical, axillary  LUNGS: clear to auscultation and percussion with normal breathing effort HEART: regular rate & rhythm and no murmurs and no lower extremity edema ABDOMEN:abdomen soft, non-tender and normal bowel sounds Musculoskeletal:no cyanosis of digits and no clubbing  NEURO: alert & oriented x 3 with fluent speech, no focal motor/sensory deficits  LABORATORY DATA:  I have reviewed the data as listed CBC Latest Ref Rng & Units 05/21/2019 05/06/2019 04/23/2019  WBC 4.0 - 10.5 K/uL 5.6 6.9 5.1  Hemoglobin 13.0 - 17.0 g/dL 10.9(L) 10.8(L) 10.4(L)  Hematocrit 39.0 - 52.0 % 33.2(L) 33.7(L) 32.3(L)  Platelets 150 - 400 K/uL 86(L) 94(L) 98(L)     CMP Latest Ref Rng & Units 05/21/2019 05/06/2019 04/23/2019  Glucose 70 - 99 mg/dL 121(H) 162(H) 146(H)  BUN 8 - 23 mg/dL '12 11 12  ' Creatinine 0.61 - 1.24 mg/dL 0.87 0.81 0.93  Sodium 135 - 145 mmol/L 142 142 142  Potassium 3.5 - 5.1 mmol/L 4.0 4.2 4.3  Chloride 98 - 111 mmol/L 106 108 108  CO2 22 - 32 mmol/L '27 26 26  ' Calcium 8.9 - 10.3 mg/dL 8.8(L) 8.8(L) 8.5(L)  Total Protein 6.5 - 8.1 g/dL 6.6 6.3(L) 6.1(L)  Total Bilirubin 0.3 - 1.2 mg/dL 0.5 0.4 0.3  Alkaline Phos 38 - 126 U/L 122 120 113  AST 15 - 41 U/L '17 18 22  ' ALT 0 - 44 U/L '21 26 30      ' RADIOGRAPHIC STUDIES: I have personally reviewed the radiological images as listed and agreed with the findings in the report. No results found.   ASSESSMENT & PLAN:  Vincent [name] is a 68 y.o. male with   1. Adenocarcinoma oftail of Pancreas, cT3N0Mx, with liver metastasis -He  was recently diagnosedin3/2020.  -His CT APand abdominal MRI showed a 1.6cm lesion in thedorm, althoughit appears likely a benign cyst on the CT scan, however the MRI features are concerning for livermetastasis. -Ipreviouslydiscussed to confirm with definitive prognosis we can do liver biopsy. Given thetypical scan findings and growth over time, the liver lesions are likely metastatic lesions. Given the current COVID-19,I will probablyskip biopsy. Heagreed. -Ipreviouslydiscussed if metastatic disease, he is no longer a surgical candidate and his cancer is no longer curable. Goal of therapy would be palliative toprolong his life and improve his quality of life. He understands. -He started first-line FOLFIRINOX every 2 weeks on 11/28/18. Heistolerating welloverallwith fatigue andfluctuatingappetite and cold sensitivity, intermittent neuropathy s/p C6 and mild diarrhea.  -He has had 2 episodes of dizziness and syncope upon standing with cycle 4. I addedIV fluids to pump d/c -I personally reviewed and discussed his CT AP from 05/19/19 which shows improvement of pancreatic tail mass and improving hepatic metastases, no other new lesion. He has stable abnormal soft tissue along the celiac axis likely reflecting tumor. Overall responding well to treatment, will continue.  -I discussed we will continue treatment for as long as it is controlling his disease and he can tolerate it. If he were to progress I would recommend second linechemo Abraxane and Gemcitabine. He understands. -Due to his moderate thrombocytopenia and neuropathy, I will hold oxaliplatin from now  -He would like to have chemo break. He will be off for 4 weeks and then be off for another 3 weeks before returning to regular regimen. I will arrange.  -Labs reviewed, CBC and CMP WNL except Hg 10.9, MCV 107.8, PLT 86K, BG 121, Ca 8.8. Overall adequate to proceed with C12 chemo with FOLFIRI today.  -F/u in 4 weeks with next  treatment.   2. Epigastricand LUQpainradiating to his back -secondary topancreaticcancer -Ipreviouslyprescribed him NORCObut does not feel it helps as much as Tramadol. I advised him not to go over 8 tabs Tramadol in a day. -Abdominal painhas mostly revolvedsince he started chemo, backand musclepain manageable with tramadol for significant pain every 2-3 days if needed. He takes Tylenol for mild to moderate pain.He is not taking norco or oxycodone. Stable  3. Lower appetite, Weight loss  -Eating exacerbates his epigastric pain and nausea -Hewas initially at 230 pounds at baseline -Continue tofollow up with Dietician -Appetitehas been adequate.His weight has been fluctuating, but mostly stable.  -I recommend he increase his protein and calorie intake. He can also continue Glucerna.  4. DM, HTN  -On  Metformin and Lisinopril -We will monitor his blood pressure and blood glucose closely during the chemotherapy. May adjustmedication during chemoif needed -I advised him to avoid sweet during the first 3-4 of each cycle given steroid premeds. -Continue to f/u with PCP -with increased water intake his dizziness has resolved. -BP 141/93 today (05/21/19).   5. Hypokalemia  -will continue toincrease potassium in his dietand continue oral potassium  6. 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   7. Neuropathy, G2  -Was previously intermittent but s/p C11 has become more constant and progressed with tingling in hands and feet.  -Will stop Oxaliplatin due to   8. Right big toe paronychia  -Has been ongoing for 1-2 weeks.  -He plans to be seen by podiatrist soon for evaluation or treatment.  -In the meantime I will call in antibiotic Keflex 1 week dose and I recommend he use warm water vinegar soak for 10-15 minutes 1-2 times a day.    9. thrombocytopenia  -Secondary to chemotherapy -We will reduce chemotherapy  10. Goal of care discussion  -We again discussed the incurable nature of his cancer, and the overall poor prognosis, especially if he does not have good response to chemotherapy or progress on chemo -The patient understands the goal of care is palliative. -He is full code now   PLAN: -I called in Keflex today  -labs reviewed and adequate to proceed with C12chemo with mFOLFIRI only, with Udenyca on day 3,  will stop Oxaliplatin due to neuropathy and thrombocytopenia  -pt will take a chemo break for his birthday    -lab, flush,f/uand chemo in4weeks and in 7 weeks    No problem-specific Assessment & Plan notes found for this encounter.   No orders of the defined types were placed in this encounter.  All questions were answered. The patient knows to call the clinic with any problems, questions or concerns. No barriers to learning was detected. I spent 25 minutes counseling the patient face to face. The total time spent in the appointment was 30 minutes and more than 50% was on counseling and review of test results     Truitt Merle, MD 05/21/2019   I, Joslyn Devon, am acting as scribe for Truitt Merle, MD.   I have reviewed the above documentation for accuracy and completeness, and I agree with the above.

## 2019-05-19 ENCOUNTER — Encounter (HOSPITAL_COMMUNITY): Payer: Self-pay

## 2019-05-19 ENCOUNTER — Ambulatory Visit (HOSPITAL_COMMUNITY)
Admission: RE | Admit: 2019-05-19 | Discharge: 2019-05-19 | Disposition: A | Discharge status: Home / Self Care | Payer: Medicare Other | Source: Ambulatory Visit | Attending: Nurse Practitioner | Admitting: Nurse Practitioner

## 2019-05-19 ENCOUNTER — Other Ambulatory Visit: Payer: Self-pay

## 2019-05-19 DIAGNOSIS — D490 Neoplasm of unspecified behavior of digestive system: Secondary | ICD-10-CM | POA: Insufficient documentation

## 2019-05-19 MED ORDER — HEPARIN SOD (PORK) LOCK FLUSH 100 UNIT/ML IV SOLN
INTRAVENOUS | Status: AC
  Filled 2019-05-19: qty 5

## 2019-05-19 MED ORDER — IOHEXOL 300 MG/ML  SOLN
100.0000 mL | Freq: Once | INTRAMUSCULAR | Status: AC | PRN
  Administered 2019-05-19: 100 mL via INTRAVENOUS

## 2019-05-19 MED ORDER — HEPARIN SOD (PORK) LOCK FLUSH 100 UNIT/ML IV SOLN
500.0000 [IU] | Freq: Once | INTRAVENOUS | Status: AC
  Administered 2019-05-19: 09:00:00 500 [IU] via INTRAVENOUS

## 2019-05-19 MED ORDER — SODIUM CHLORIDE (PF) 0.9 % IJ SOLN
INTRAMUSCULAR | Status: AC
  Filled 2019-05-19: qty 50

## 2019-05-21 ENCOUNTER — Inpatient Hospital Stay: Payer: Medicare Other

## 2019-05-21 ENCOUNTER — Encounter: Payer: Self-pay | Admitting: Hematology

## 2019-05-21 ENCOUNTER — Inpatient Hospital Stay (HOSPITAL_BASED_OUTPATIENT_CLINIC_OR_DEPARTMENT_OTHER): Payer: Medicare Other | Admitting: Hematology

## 2019-05-21 ENCOUNTER — Other Ambulatory Visit: Payer: Self-pay

## 2019-05-21 VITALS — BP 141/93 | HR 72 | Temp 98.5°F | Resp 17 | Ht 76.0 in | Wt 209.9 lb

## 2019-05-21 DIAGNOSIS — Z95828 Presence of other vascular implants and grafts: Secondary | ICD-10-CM

## 2019-05-21 DIAGNOSIS — Z5111 Encounter for antineoplastic chemotherapy: Secondary | ICD-10-CM | POA: Diagnosis not present

## 2019-05-21 DIAGNOSIS — C250 Malignant neoplasm of head of pancreas: Secondary | ICD-10-CM | POA: Diagnosis not present

## 2019-05-21 DIAGNOSIS — Z7189 Other specified counseling: Secondary | ICD-10-CM

## 2019-05-21 DIAGNOSIS — C252 Malignant neoplasm of tail of pancreas: Secondary | ICD-10-CM

## 2019-05-21 LAB — CMP (CANCER CENTER ONLY)
ALT: 21 U/L (ref 0–44)
AST: 17 U/L (ref 15–41)
Albumin: 3.7 g/dL (ref 3.5–5.0)
Alkaline Phosphatase: 122 U/L (ref 38–126)
Anion gap: 9 (ref 5–15)
BUN: 12 mg/dL (ref 8–23)
CO2: 27 mmol/L (ref 22–32)
Calcium: 8.8 mg/dL — ABNORMAL LOW (ref 8.9–10.3)
Chloride: 106 mmol/L (ref 98–111)
Creatinine: 0.87 mg/dL (ref 0.61–1.24)
GFR, Est AFR Am: >60 mL/min (ref >60)
GFR, Estimated: >60 mL/min (ref >60)
Glucose, Bld: 121 mg/dL — ABNORMAL HIGH (ref 70–99)
Potassium: 4 mmol/L (ref 3.5–5.1)
Sodium: 142 mmol/L (ref 135–145)
Total Bilirubin: 0.5 mg/dL (ref 0.3–1.2)
Total Protein: 6.6 g/dL (ref 6.5–8.1)

## 2019-05-21 LAB — CBC WITH DIFFERENTIAL (CANCER CENTER ONLY)
Abs Immature Granulocytes: 0.01 10*3/uL (ref 0.00–0.07)
Basophils Absolute: 0 10*3/uL (ref 0.0–0.1)
Basophils Relative: 1 %
Eosinophils Absolute: 0.1 10*3/uL (ref 0.0–0.5)
Eosinophils Relative: 3 %
HCT: 33.2 % — ABNORMAL LOW (ref 39.0–52.0)
Hemoglobin: 10.9 g/dL — ABNORMAL LOW (ref 13.0–17.0)
Immature Granulocytes: 0 %
Lymphocytes Relative: 15 %
Lymphs Abs: 0.9 10*3/uL (ref 0.7–4.0)
MCH: 35.4 pg — ABNORMAL HIGH (ref 26.0–34.0)
MCHC: 32.8 g/dL (ref 30.0–36.0)
MCV: 107.8 fL — ABNORMAL HIGH (ref 80.0–100.0)
Monocytes Absolute: 0.6 10*3/uL (ref 0.1–1.0)
Monocytes Relative: 10 %
Neutro Abs: 4 10*3/uL (ref 1.7–7.7)
Neutrophils Relative %: 71 %
Platelet Count: 86 10*3/uL — ABNORMAL LOW (ref 150–400)
RBC: 3.08 MIL/uL — ABNORMAL LOW (ref 4.22–5.81)
RDW: 14.1 % (ref 11.5–15.5)
WBC Count: 5.6 10*3/uL (ref 4.0–10.5)
nRBC: 0 % (ref 0.0–0.2)

## 2019-05-21 MED ORDER — ATROPINE SULFATE 0.4 MG/ML IJ SOLN
0.4000 mg | Freq: Once | INTRAMUSCULAR | Status: AC | PRN
  Administered 2019-05-21: 0.4 mg via INTRAVENOUS

## 2019-05-21 MED ORDER — PALONOSETRON HCL INJECTION 0.25 MG/5ML
INTRAVENOUS | Status: AC
  Filled 2019-05-21: qty 5

## 2019-05-21 MED ORDER — DEXTROSE 5 % IV SOLN
Freq: Once | INTRAVENOUS | Status: AC
  Administered 2019-05-21: 11:00:00 via INTRAVENOUS
  Filled 2019-05-21: qty 250

## 2019-05-21 MED ORDER — SODIUM CHLORIDE 0.9 % IV SOLN
2000.0000 mg/m2 | INTRAVENOUS | Status: DC
  Administered 2019-05-21: 4500 mg via INTRAVENOUS
  Filled 2019-05-21: qty 90

## 2019-05-21 MED ORDER — DEXTROSE 5 % IV SOLN
Freq: Once | INTRAVENOUS | Status: AC
  Administered 2019-05-21: 13:00:00 via INTRAVENOUS
  Filled 2019-05-21: qty 250

## 2019-05-21 MED ORDER — ATROPINE SULFATE 0.4 MG/ML IJ SOLN
INTRAMUSCULAR | Status: AC
  Filled 2019-05-21: qty 1

## 2019-05-21 MED ORDER — SODIUM CHLORIDE 0.9 % IV SOLN
Freq: Once | INTRAVENOUS | Status: AC
  Administered 2019-05-21: 12:00:00 via INTRAVENOUS
  Filled 2019-05-21: qty 5

## 2019-05-21 MED ORDER — PALONOSETRON HCL INJECTION 0.25 MG/5ML
0.2500 mg | Freq: Once | INTRAVENOUS | Status: AC
  Administered 2019-05-21: 0.25 mg via INTRAVENOUS

## 2019-05-21 MED ORDER — SODIUM CHLORIDE 0.9% FLUSH
10.0000 mL | Freq: Once | INTRAVENOUS | Status: AC
  Administered 2019-05-21: 10 mL
  Filled 2019-05-21: qty 10

## 2019-05-21 MED ORDER — CEPHALEXIN 500 MG PO CAPS: 500.0000 mg | ORAL_CAPSULE | Freq: Three times a day (TID) | ORAL | 0 refills | Status: DC

## 2019-05-21 MED ORDER — IRINOTECAN HCL CHEMO INJECTION 100 MG/5ML: 130.0000 mg/m2 | Freq: Once | INTRAVENOUS | Status: DC

## 2019-05-21 MED ORDER — LEUCOVORIN CALCIUM INJECTION 350 MG
400.0000 mg/m2 | Freq: Once | INTRAVENOUS | Status: AC
  Administered 2019-05-21: 976 mg via INTRAVENOUS
  Filled 2019-05-21: qty 25

## 2019-05-21 MED ORDER — IRINOTECAN HCL CHEMO INJECTION 100 MG/5ML
130.0000 mg/m2 | Freq: Once | INTRAVENOUS | Status: AC
  Administered 2019-05-21: 300 mg via INTRAVENOUS
  Filled 2019-05-21: qty 15

## 2019-05-21 NOTE — Patient Instructions (Signed)
[facility] Discharge Instructions for Patients Receiving Chemotherapy  Today you received the following chemotherapy agents: leucovorin, irinotecan, 5FU  To help prevent nausea and vomiting after your treatment, we encourage you to take your nausea medication as directed.   If you develop nausea and vomiting that is not controlled by your nausea medication, call the clinic.   BELOW ARE SYMPTOMS THAT SHOULD BE REPORTED IMMEDIATELY:  *FEVER GREATER THAN 100.5 F  *CHILLS WITH OR WITHOUT FEVER  NAUSEA AND VOMITING THAT IS NOT CONTROLLED WITH YOUR NAUSEA MEDICATION  *UNUSUAL SHORTNESS OF BREATH  *UNUSUAL BRUISING OR BLEEDING  TENDERNESS IN MOUTH AND THROAT WITH OR WITHOUT PRESENCE OF ULCERS  *URINARY PROBLEMS  *BOWEL PROBLEMS  UNUSUAL RASH Items with * indicate a potential emergency and should be followed up as soon as possible.  Feel free to call the clinic should you have any questions or concerns. The clinic phone number is [phone].  Please show the [place] at check-in to the Emergency Department and triage nurse.   Thrombocytopenia Thrombocytopenia means that you have a low number of platelets in your blood. Platelets are tiny cells in the blood. When you bleed, they clump together at the cut or injury to stop the bleeding. This is called blood clotting. If you do not have enough platelets, it can cause bleeding problems. Some cases of this condition are mild while others are more severe. What are the causes? This condition may be caused by:  Your body not making enough platelets. This may be caused by: ? Your bone marrow not making blood cells (aplastic anemia). ? Cancer in the bone marrow. ? Certain medicines. ? Infection in the bone marrow. ? Drinking a lot of alcohol.  Your body destroying platelets too quickly. This may be caused by: ? Certain immune diseases. ? Certain medicines. ? Certain blood clotting disorders. ? Certain  disorders that are passed from parent to child (inherited). ? Certain bleeding disorders. ? Pregnancy. ? Having a spleen that is larger than normal. What are the signs or symptoms?  Bleeding that is not normal.  Nosebleeds.  Heavy menstrual periods.  Blood in the pee (urine) or poop (stool).  A purple-like color to the skin (purpura).  Bruising.  A rash that looks like pinpoint, purple-red spots (petechiae). How is this treated?  Treatment of another condition that is causing the low platelet count.  Medicines to help protect your platelets from being destroyed.  A replacement (transfusion) of platelets to stop or prevent bleeding.  Surgery to remove the spleen. Follow these instructions at home: Activity  Avoid activities that could cause you to get hurt or bruised. Follow instructions about how to prevent falls.  Take care not to cut yourself: ? When you shave. ? When you use scissors, needles, knives, or other tools.  Take care not to burn yourself: ? When you use an iron. ? When you cook. General instructions   Check your skin and the inside of your mouth for bruises or blood as told by your doctor.  Check to see if there is blood in your spit (sputum), pee, and poop. Do this as told by your doctor.  Do not drink alcohol.  Take over-the-counter and prescription medicines only as told by your doctor.  Do not take any medicines that have aspirin or NSAIDs in them. These medicines can thin your blood and cause you to bleed.  Tell all of your doctors that you have this condition. Be sure  to tell your dentist and eye doctor too. Contact a doctor if:  You have bruises and you do not know why. Get help right away if:  You are bleeding anywhere on your body.  You have blood in your spit, pee, or poop. Summary  Thrombocytopenia means that you have a low number of platelets in your blood.  Platelets are needed for blood clotting.  Symptoms of this  condition include bleeding that is not normal, and bruising.  Take care not to cut or burn yourself. This information is not intended to replace advice given to you by your health care provider. Make sure you discuss any questions you have with your health care provider. Document Released: 07/13/2011 Document Revised: 04/25/2018 Document Reviewed: 04/25/2018 Elsevier Patient Education  2020 [employer].

## 2019-05-21 NOTE — Progress Notes (Signed)
MD would like doses of chemo adjusted to use today's wt (BSA = 2.26 m2). Kennith Center, Pharm.D., CPP 05/21/2019@12 :01 PM

## 2019-05-21 NOTE — Patient Instructions (Signed)

## 2019-05-21 NOTE — Progress Notes (Signed)
Per Dr. Burr Medico patient will not receive Oxaliplatin today due to plt of 86.

## 2019-05-22 ENCOUNTER — Telehealth: Payer: Self-pay | Admitting: Hematology

## 2019-05-22 LAB — CANCER ANTIGEN 19-9: CA 19-9: 1953 U/mL — ABNORMAL HIGH (ref 0–35)

## 2019-05-22 NOTE — Telephone Encounter (Signed)
Scheduled appt per 10/14 los.  Left a vm of the appt date and time.

## 2019-05-23 ENCOUNTER — Inpatient Hospital Stay: Payer: Medicare Other

## 2019-05-23 ENCOUNTER — Other Ambulatory Visit: Payer: Self-pay

## 2019-05-23 VITALS — BP 123/78 | HR 67 | Temp 98.6°F | Resp 18

## 2019-05-23 DIAGNOSIS — C252 Malignant neoplasm of tail of pancreas: Secondary | ICD-10-CM

## 2019-05-23 DIAGNOSIS — Z7189 Other specified counseling: Secondary | ICD-10-CM

## 2019-05-23 DIAGNOSIS — Z5111 Encounter for antineoplastic chemotherapy: Secondary | ICD-10-CM | POA: Diagnosis not present

## 2019-05-23 MED ORDER — HEPARIN SOD (PORK) LOCK FLUSH 100 UNIT/ML IV SOLN
500.0000 [IU] | Freq: Once | INTRAVENOUS | Status: AC | PRN
  Administered 2019-05-23: 500 [IU]
  Filled 2019-05-23: qty 5

## 2019-05-23 MED ORDER — SODIUM CHLORIDE 0.9% FLUSH
10.0000 mL | INTRAVENOUS | Status: DC | PRN
  Administered 2019-05-23: 10 mL
  Filled 2019-05-23: qty 10

## 2019-06-11 ENCOUNTER — Other Ambulatory Visit: Payer: Self-pay | Admitting: Hematology

## 2019-06-13 NOTE — Progress Notes (Signed)
Vincent [name]   Telephone:([phone] [phone]   Clinic Follow up Note   Patient Care Team: Leonard Downing, MD as PCP - General (Family Medicine)  Date of Service:  06/18/2019  CHIEF COMPLAINT: F/u of metastatic pancreatic Cancer  SUMMARY OF ONCOLOGIC HISTORY: Oncology History Overview Note  Cancer Staging Pancreatic cancer [facility]) Staging form: Exocrine Pancreas, AJCC 8th Edition - Clinical stage from 10/29/2018: Stage IV (cT3, cN0, cM1) - Signed by Truitt Merle, MD on 11/05/2018     Pancreatic cancer ([place])  10/09/2018 Procedure   Upper Endoscopy by. Dr Paulita Fujita 10/29/18 IMPRESSION - There was no evidence of significant pathology in the left lobe of the liver. - There was no sign of significant pathology in the gallbladder. - Many abnormal lymph nodes were visualized in the splenic region (level 19), perigastric region and peripancreatic region. - A mass was identified in the pancreatic tail. This was staged T4 N2 Mx by endosonographic criteria. Fine needle aspiration performed.   10/10/2018 Imaging   CT Abdomen 10/10/18 IMPRESSION: 1. Uncomplicated acute diverticulitis of the proximal sigmoid colon. 2. Ill-defined low-density in the pancreatic tail measuring at least 4.7 cm, with possible punctate peripheral calcifications. This is suspicious for pancreatic neoplasm, however sequela of prior pancreatitis is also considered. Recommend further characterization with pancreatic protocol MRI when patient is able to tolerate breath hold technique. 3. 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 4. 16 mm cyst in the left lobe of the liver. Aortic Atherosclerosis (ICD10-I70.0).   10/16/2018 Imaging   MRI Abdomen at [facility] 10/16/18 IMPRESSION: 1. Mass in the tail the pancreas concerning for neoplasm. This contacts the spleen in the splenic hilum. 2. There is stranding in the fat extending from  the tumor to the celiac axis and left adrenal gland. Cannot exclude infiltrating tumor. 3. 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 4. metastatic lesion in the liver   10/29/2018 Initial Biopsy   Diagnosis 10/29/18 FINE NEEDLE ASPIRATION, ENDOSCOPIC, PANCREAS TAIL(SPECIMEN 1 OF 1 COLLECTED 10/29/18): MALIGNANT CELLS PRESENT, CONSISTENT WITH ADENOCARCINOMA. SEE COMMENT. COMMENT: THERE IS INSUFFICIENT TUMOR PRESENT FOR ADDITIONAL STUDIES.   10/29/2018 Cancer Staging   Staging form: Exocrine Pancreas, AJCC 8th Edition - Clinical stage from 10/29/2018: Stage IV (cT3, cN0, cM1) - Signed by Truitt Merle, MD on 11/05/2018   11/04/2018 Initial Diagnosis   Pancreatic cancer ([place])   11/27/2018 Imaging   CT Chest  IMPRESSION: 1.  No acute process or evidence of metastatic disease in the chest. 2. Locally advanced pancreatic carcinoma, as detailed previously. Cannot exclude superimposed pancreatitis. 3. New and enlarging liver lesions, suspicious for hepatic metastasis. 4. Aortic atherosclerosis (ICD10-I70.0), coronary artery atherosclerosis and emphysema (ICD10-J43.9).   11/28/2018 -  Chemotherapy   First line FOLFIRINOX q2weeks starting 11/28/18. Reduced dose of Oxaliplatin starting with cycle 9 due to mild thrombocytopenia and tolerance issue. Due to neuropathy and thrombocytopenia Oxaliplatin was d/c and we downgraded to FOLFIRI starting with C12 on 05/21/19.    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  The Common Hereditary Gene Panel offered by [facility] includes sequencing and/or deletion duplication testing of the following 48 genes: APC, ATM, AXIN2, BARD1, BMPR1A, BRCA1, BRCA2, BRIP1, CDH1, CDK4, CDKN2A (p14ARF), CDKN2A (p16INK4a), CHEK2, CTNNA1, DICER1, EPCAM (Deletion/duplication testing only), GREM1 (promoter region deletion/duplication testing only), KIT, MEN1,  MLH1, MSH2, MSH3, MSH6, MUTYH, NBN, NF1, NHTL1, PALB2,  PDGFRA, PMS2, POLD1, POLE, PTEN, RAD50, RAD51C, RAD51D, RNF43, SDHB, SDHC, SDHD, SMAD4, SMARCA4. STK11, TP53, TSC1, TSC2, and VHL.  The following genes were evaluated for sequence changes only: SDHA and HOXB13 c.251G>A variant only. The report date is December 04, 2018.,   02/11/2019 Imaging   CT AP W Contrast  IMPRESSION: 1. Stable appearing ill-defined pancreatic mass in the tail region extending into the splenic hilum. 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 2. The left hepatic lobe metastatic lesion is slightly larger and the right hepatic lobe lesion is slightly smaller. No new lesions are identified. 3. Small peripancreatic lymph nodes are stable. No retroperitoneal lymphadenopathy. 4. No worrisome pulmonary nodules at the lung bases and no worrisome bone lesions.   05/19/2019 Imaging   CT AP W Contrast  IMPRESSION: 4.5 cm pancreatic tail mass extending to the splenic hilum, mildly improved.   Improving hepatic metastases.   Stable abnormal soft tissue along the celiac axis, likely reflecting tumor.        CURRENT THERAPY:  First lineFOLFIRINOX q2weeks starting4/23/20. Reduced dose of Oxaliplatin starting with cycle 9 due to mild thrombocytopenia andtolerance issue. Due to neuropathy and thrombocytopenia Oxaliplatin was d/c and we change it to FOLFIRI starting with C12 on 05/21/19 every 3-4 weeks due to [facility] and slow recovery of thrombocytopenia.   INTERVAL HISTORY:  Vincent [name] is here for a follow up and treatment. He presents to the clinic alone. He notes he is doing well. He has tingling of soles and tips of fingers. He notes no dexterity changes.    REVIEW OF SYSTEMS:   Constitutional: Denies fevers, chills or abnormal weight loss Eyes: Denies blurriness of vision Ears, nose, mouth, throat, and face: Denies mucositis or sore throat Respiratory:  Denies cough, dyspnea or wheezes Cardiovascular: Denies palpitation, chest discomfort or lower extremity swelling Gastrointestinal:  Denies nausea, heartburn or change in bowel habits Skin: Denies abnormal skin rashes Lymphatics: Denies new lymphadenopathy or easy bruising Neurological: (+) Tingling of soles of feet and tips of fingers  Behavioral/Psych: Mood is stable, no new changes  All other systems were reviewed with the patient and are negative.  MEDICAL HISTORY:  Past Medical History:  Diagnosis Date   Diabetes mellitus without complication ([place])    pt. states that he is not diabeticalthought he takes metformin   Eye hemorrhage    right   Family history of lung cancer    Hypertension    Monoallelic mutation of BRIP1 gene    Monoallelic mutation of MUTYH gene     SURGICAL HISTORY: Past Surgical History:  Procedure Laterality Date   ESOPHAGOGASTRODUODENOSCOPY (EGD) WITH PROPOFOL N/A 10/29/2018   Procedure: ESOPHAGOGASTRODUODENOSCOPY (EGD) WITH PROPOFOL;  Surgeon: Arta Silence, MD;  Location: [facility];  Service: Endoscopy;  Laterality: N/A;   EUS N/A 10/29/2018   Procedure: FULL UPPER ENDOSCOPIC ULTRASOUND (EUS) RADIAL;  Surgeon: Arta Silence, MD;  Location: [facility];  Service: Endoscopy;  Laterality: N/A;   FINE NEEDLE ASPIRATION N/A 10/29/2018   Procedure: FINE NEEDLE ASPIRATION (FNA) LINEAR;  Surgeon: Arta Silence, MD;  Location: [facility];  Service: Endoscopy;  Laterality: N/A;   IR IMAGING GUIDED PORT INSERTION  11/11/2018   LUMBAR LAMINECTOMY/DECOMPRESSION MICRODISCECTOMY Left 10/11/2017   Procedure: Microlumbar decompression Lumbar Four-Five Left;  Surgeon: Susa Day, MD;  Location: [place];  Service: Orthopedics;  Laterality: Left;  Microlumbar decompression Lumbar Four-Five Left   removal of wisdom teeth  1977    I have reviewed  the social history and family history with the patient and they are unchanged from previous note.  ALLERGIES:   has No Known Allergies.  MEDICATIONS:  Current Outpatient Medications  Medication Sig Dispense Refill   Accu-Chek FastClix Lancets MISC daily. for testing     aspirin EC 81 MG tablet Take 1 tablet (81 mg total) by mouth daily after breakfast. Resume 4 days post-op     cephALEXin (KEFLEX) 500 MG capsule Take 1 capsule (500 mg total) by mouth 3 (three) times daily. 21 capsule 0   gabapentin (NEURONTIN) 100 MG capsule Take 1 capsule (100 mg total) by mouth at bedtime. May gradually increase to 300 mg at night (Patient taking differently: Take 200 mg by mouth at bedtime. May gradually increase to 300 mg at night) 90 capsule 1   Ginger, Zingiber officinalis, (GINGER ROOT PO) Take 750 mg by mouth.     KLOR-CON M20 20 MEQ tablet TAKE 1 TABLET (20 MEQ TOTAL) BY MOUTH EVERY OTHER DAY. 30 tablet 0   lidocaine-prilocaine (EMLA) cream Apply to affected area once 30 g 3   lisinopril (PRINIVIL,ZESTRIL) 10 MG tablet Take 10 mg by mouth daily after breakfast.     metFORMIN (GLUCOPHAGE) 1000 MG tablet TAKE 1 TABLET BY MOUTH AT BREAKFAST AND 1/2 TABLET AT SUPPER     ondansetron (ZOFRAN) 8 MG tablet Take 1 tablet (8 mg total) by mouth 2 (two) times daily as needed for refractory nausea / vomiting. Start on day 3 after chemotherapy. 30 tablet 1   prochlorperazine (COMPAZINE) 10 MG tablet Take 1 tablet (10 mg total) by mouth every 6 (six) hours as needed (NAUSEA). 30 tablet 1   simvastatin (ZOCOR) 40 MG tablet Take 20 mg by mouth every evening.     traMADol (ULTRAM) 50 MG tablet Take 2 tablets (100 mg total) by mouth every 6 (six) hours as needed. 60 tablet 0   No current facility-administered medications for this visit.     PHYSICAL EXAMINATION: ECOG PERFORMANCE STATUS: 0 - Asymptomatic  Vitals:   06/18/19 0905  BP: 131/89  Pulse: 77  Resp: 18  Temp: 98 F (36.7 C)  SpO2: 98%   Filed Weights   06/18/19 0905  Weight: 222 lb 3.2 oz (100.8 kg)    GENERAL:alert, no distress and  comfortable SKIN: skin color, texture, turgor are normal, no rashes or significant lesions EYES: normal, Conjunctiva are pink and non-injected, sclera clear  NECK: supple, thyroid normal size, non-tender, without nodularity LYMPH:  no palpable lymphadenopathy in the cervical, axillary  LUNGS: clear to auscultation and percussion with normal breathing effort HEART: regular rate & rhythm and no murmurs and no lower extremity edema ABDOMEN:abdomen soft, non-tender and normal bowel sounds Musculoskeletal:no cyanosis of digits and no clubbing  NEURO: alert & oriented x 3 with fluent speech, no focal motor/sensory deficits  LABORATORY DATA:  I have reviewed the data as listed CBC Latest Ref Rng & Units 06/18/2019 05/21/2019 05/06/2019  WBC 4.0 - 10.5 K/uL 3.3(L) 5.6 6.9  Hemoglobin 13.0 - 17.0 g/dL 11.2(L) 10.9(L) 10.8(L)  Hematocrit 39.0 - 52.0 % 34.5(L) 33.2(L) 33.7(L)  Platelets 150 - 400 K/uL 102(L) 86(L) 94(L)     CMP Latest Ref Rng & Units 06/18/2019 05/21/2019 05/06/2019  Glucose 70 - 99 mg/dL 153(H) 121(H) 162(H)  BUN 8 - 23 mg/dL _0 Creatinine 0.61 - 1.24 mg/dL 0.85 0.87 0.81  Sodium 135 - 145 mmol/L 139 142 142  Potassium 3.5 - 5.1 mmol/L 4.2 4.0 4.2  Chloride 98 - 111 mmol/L 108 106 108  CO2 22 - 32 mmol/L _0 Calcium 8.9 - 10.3 mg/dL 8.5(L) 8.8(L) 8.8(L)  Total Protein 6.5 - 8.1 g/dL 6.6 6.6 6.3(L)  Total Bilirubin 0.3 - 1.2 mg/dL 0.5 0.5 0.4  Alkaline Phos 38 - 126 U/L 113 122 120  AST 15 - 41 U/L 14(L) 17 18  ALT 0 - 44 U/L _1 RADIOGRAPHIC STUDIES: I have personally reviewed the radiological images as listed and agreed with the findings in the report. No results found.   ASSESSMENT & PLAN:  Vincent [name] is a 68 y.o. male with   1. Adenocarcinoma oftail of Pancreas, cT3N0Mx, with liver metastasis -He was diagnosedin3/2020.  -His CT APand abdominal MRI showed a 1.6cm lesion in thedorm, althoughit appears likely a benign cyst  on the CT scan, however the MRI features are concerning for livermetastasis. -Ipreviouslydiscussed to confirm with definitive prognosis we can do liver biopsy. Given thetypical scan findings and growth over time, the liver lesions are likely metastatic lesions. Given the current COVID-19,I will probablyskip biopsy. Heagreed. -Ipreviouslydiscussed if metastatic disease, he is no longer a surgical candidate and his cancer is no longer curable. Goal of therapy would be palliative toprolong his life and improve his quality of life. He understands. -He started first-line FOLFIRINOX every 2 weeks on 11/28/18. Heistolerating welloverallwith fatigue andfluctuatingappetite, cold sensitivity, intermittent neuropathy, mild diarrhea and dehydration. Given neuropathy and thrombocytopenia, d/c oxaliplatin C12 and proceed with FOLFIRI.  -He tolerated FOLFIRI well, but has developed pancytopenia which cause treatment delay. He remains stable overall. Labs reviewed and adequate to proceed with C13 FOLFIRI today.  -f/u and next cycle chemo in 3 weeks, postpone due to holiday and thrombocytopenia    2. Epigastricand LUQpainradiating to his back -Secondary topancreaticcancer -Ipreviouslyprescribed him NORCObut does not feel it helps as much as Tramadol. I advised him not to go over 8 tabs Tramadol in a day. -Abdominal painhasmostlyresolved since he started chemo, backand musclepain manageable with tramadolfor significant pain every 2-3 days if needed.He takes Tylenol for mild to moderate pain.He is not taking norco or oxycodone. Stable  3. Lower appetite, Weight loss  -Eating exacerbates his epigastric pain and nausea -Hewas initially at 230 pounds at baseline -Continue tofollow up with Dietician -Appetitehas been adequate.His weight has been fluctuating, but has trended up recently.   4. DM, HTN  -On Metformin and Lisinopril -I advised him to avoid sweet during the  first 3-4 of each cycle given steroid premeds. -Continue to f/u with PCP  5. Hypokalemia  -will continue toincrease potassium in his dietand continue oral potassium  6. 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   7. Neuropathy, G2  -Was previously intermittent but s/p C11 has become more constant and progressed with tingling in hands and feet. This is manageable for him.  -Oxaliplatin d/c since C12  8. Right big toe paronychia  -Has been ongoing for 1-2 weeks.  -He plans to be seen by podiatrist soon for evaluation or treatment.  -I previously called in Keflex and encouraged him to use warm water vinegar soak.   9. Thrombocytopenia  -Secondary to chemotherapy.  -Oxaliplatin d/c since C12  10. Goal of care discussion  -The patient understands the goal of care is palliative. -He is full code now   PLAN: -Labs reviewed and adequate to proceed with C13  FOLFIRI with Udenyca on day 3 -Lab, flush, f/u and FOLFIRI in 3 weeks and on 12/29   No problem-specific Assessment & Plan notes found for this encounter.   No orders of the defined types were placed in this encounter.  All questions were answered. The patient knows to call the clinic with any problems, questions or concerns. No barriers to learning was detected. I spent 20 minutes counseling the patient face to face. The total time spent in the appointment was 25 minutes and more than 50% was on counseling and review of test results     Truitt Merle, MD 06/18/2019   I, Joslyn Devon, am acting as scribe for Truitt Merle, MD.   I have reviewed the above documentation for accuracy and completeness, and I agree with the above.

## 2019-06-18 ENCOUNTER — Inpatient Hospital Stay: Payer: Medicare Other

## 2019-06-18 ENCOUNTER — Inpatient Hospital Stay: Payer: Medicare Other | Attending: Hematology

## 2019-06-18 ENCOUNTER — Other Ambulatory Visit: Payer: Self-pay

## 2019-06-18 ENCOUNTER — Inpatient Hospital Stay (HOSPITAL_BASED_OUTPATIENT_CLINIC_OR_DEPARTMENT_OTHER): Payer: Medicare Other | Admitting: Hematology

## 2019-06-18 ENCOUNTER — Encounter: Payer: Self-pay | Admitting: Hematology

## 2019-06-18 VITALS — BP 131/89 | HR 77 | Temp 98.0°F | Resp 18 | Ht 76.0 in | Wt 222.2 lb

## 2019-06-18 DIAGNOSIS — C252 Malignant neoplasm of tail of pancreas: Secondary | ICD-10-CM | POA: Diagnosis not present

## 2019-06-18 DIAGNOSIS — D6959 Other secondary thrombocytopenia: Secondary | ICD-10-CM | POA: Insufficient documentation

## 2019-06-18 DIAGNOSIS — C250 Malignant neoplasm of head of pancreas: Secondary | ICD-10-CM

## 2019-06-18 DIAGNOSIS — E1140 Type 2 diabetes mellitus with diabetic neuropathy, unspecified: Secondary | ICD-10-CM | POA: Diagnosis not present

## 2019-06-18 DIAGNOSIS — Z79899 Other long term (current) drug therapy: Secondary | ICD-10-CM | POA: Insufficient documentation

## 2019-06-18 DIAGNOSIS — E876 Hypokalemia: Secondary | ICD-10-CM | POA: Diagnosis not present

## 2019-06-18 DIAGNOSIS — I10 Essential (primary) hypertension: Secondary | ICD-10-CM | POA: Insufficient documentation

## 2019-06-18 DIAGNOSIS — T451X5A Adverse effect of antineoplastic and immunosuppressive drugs, initial encounter: Secondary | ICD-10-CM | POA: Diagnosis not present

## 2019-06-18 DIAGNOSIS — Z5111 Encounter for antineoplastic chemotherapy: Secondary | ICD-10-CM | POA: Insufficient documentation

## 2019-06-18 DIAGNOSIS — L03031 Cellulitis of right toe: Secondary | ICD-10-CM | POA: Insufficient documentation

## 2019-06-18 DIAGNOSIS — C787 Secondary malignant neoplasm of liver and intrahepatic bile duct: Secondary | ICD-10-CM | POA: Diagnosis not present

## 2019-06-18 DIAGNOSIS — Z7984 Long term (current) use of oral hypoglycemic drugs: Secondary | ICD-10-CM | POA: Diagnosis not present

## 2019-06-18 DIAGNOSIS — Z95828 Presence of other vascular implants and grafts: Secondary | ICD-10-CM

## 2019-06-18 DIAGNOSIS — Z7189 Other specified counseling: Secondary | ICD-10-CM

## 2019-06-18 LAB — CMP (CANCER CENTER ONLY)
ALT: 18 U/L (ref 0–44)
AST: 14 U/L — ABNORMAL LOW (ref 15–41)
Albumin: 3.6 g/dL (ref 3.5–5.0)
Alkaline Phosphatase: 113 U/L (ref 38–126)
Anion gap: 8 (ref 5–15)
BUN: 18 mg/dL (ref 8–23)
CO2: 23 mmol/L (ref 22–32)
Calcium: 8.5 mg/dL — ABNORMAL LOW (ref 8.9–10.3)
Chloride: 108 mmol/L (ref 98–111)
Creatinine: 0.85 mg/dL (ref 0.61–1.24)
GFR, Est AFR Am: >60 mL/min (ref >60)
GFR, Estimated: >60 mL/min (ref >60)
Glucose, Bld: 153 mg/dL — ABNORMAL HIGH (ref 70–99)
Potassium: 4.2 mmol/L (ref 3.5–5.1)
Sodium: 139 mmol/L (ref 135–145)
Total Bilirubin: 0.5 mg/dL (ref 0.3–1.2)
Total Protein: 6.6 g/dL (ref 6.5–8.1)

## 2019-06-18 LAB — CBC WITH DIFFERENTIAL (CANCER CENTER ONLY)
Abs Immature Granulocytes: 0.01 10*3/uL (ref 0.00–0.07)
Basophils Absolute: 0.1 10*3/uL (ref 0.0–0.1)
Basophils Relative: 2 %
Eosinophils Absolute: 0.2 10*3/uL (ref 0.0–0.5)
Eosinophils Relative: 6 %
HCT: 34.5 % — ABNORMAL LOW (ref 39.0–52.0)
Hemoglobin: 11.2 g/dL — ABNORMAL LOW (ref 13.0–17.0)
Immature Granulocytes: 0 %
Lymphocytes Relative: 25 %
Lymphs Abs: 0.8 10*3/uL (ref 0.7–4.0)
MCH: 34.8 pg — ABNORMAL HIGH (ref 26.0–34.0)
MCHC: 32.5 g/dL (ref 30.0–36.0)
MCV: 107.1 fL — ABNORMAL HIGH (ref 80.0–100.0)
Monocytes Absolute: 0.4 10*3/uL (ref 0.1–1.0)
Monocytes Relative: 11 %
Neutro Abs: 1.9 10*3/uL (ref 1.7–7.7)
Neutrophils Relative %: 56 %
Platelet Count: 102 10*3/uL — ABNORMAL LOW (ref 150–400)
RBC: 3.22 MIL/uL — ABNORMAL LOW (ref 4.22–5.81)
RDW: 12.8 % (ref 11.5–15.5)
WBC Count: 3.3 10*3/uL — ABNORMAL LOW (ref 4.0–10.5)
nRBC: 0 % (ref 0.0–0.2)

## 2019-06-18 MED ORDER — ATROPINE SULFATE 0.4 MG/ML IJ SOLN: 0.4000 mg | Freq: Once | INTRAMUSCULAR | Status: DC | PRN

## 2019-06-18 MED ORDER — ATROPINE SULFATE 1 MG/ML IJ SOLN
INTRAMUSCULAR | Status: AC
  Filled 2019-06-18: qty 1

## 2019-06-18 MED ORDER — SODIUM CHLORIDE 0.9 % IV SOLN
Freq: Once | INTRAVENOUS | Status: AC
  Administered 2019-06-18: 10:00:00 via INTRAVENOUS
  Filled 2019-06-18: qty 5

## 2019-06-18 MED ORDER — PALONOSETRON HCL INJECTION 0.25 MG/5ML
0.2500 mg | Freq: Once | INTRAVENOUS | Status: AC
  Administered 2019-06-18: 0.25 mg via INTRAVENOUS

## 2019-06-18 MED ORDER — SODIUM CHLORIDE 0.9% FLUSH
10.0000 mL | Freq: Once | INTRAVENOUS | Status: AC
  Administered 2019-06-18: 10 mL
  Filled 2019-06-18: qty 10

## 2019-06-18 MED ORDER — ATROPINE SULFATE 1 MG/ML IJ SOLN
0.5000 mg | Freq: Once | INTRAMUSCULAR | Status: AC
  Administered 2019-06-18: 0.5 mg via INTRAVENOUS

## 2019-06-18 MED ORDER — LEUCOVORIN CALCIUM INJECTION 350 MG
432.0000 mg/m2 | Freq: Once | INTRAVENOUS | Status: AC
  Administered 2019-06-18: 976 mg via INTRAVENOUS
  Filled 2019-06-18: qty 48.8

## 2019-06-18 MED ORDER — PALONOSETRON HCL INJECTION 0.25 MG/5ML
INTRAVENOUS | Status: AC
  Filled 2019-06-18: qty 5

## 2019-06-18 MED ORDER — DEXTROSE 5 % IV SOLN
Freq: Once | INTRAVENOUS | Status: AC
  Administered 2019-06-18: 10:00:00 via INTRAVENOUS
  Filled 2019-06-18: qty 250

## 2019-06-18 MED ORDER — IRINOTECAN HCL CHEMO INJECTION 100 MG/5ML
130.0000 mg/m2 | Freq: Once | INTRAVENOUS | Status: AC
  Administered 2019-06-18: 300 mg via INTRAVENOUS
  Filled 2019-06-18: qty 15

## 2019-06-18 MED ORDER — SODIUM CHLORIDE 0.9 % IV SOLN
2200.0000 mg/m2 | INTRAVENOUS | Status: DC
  Administered 2019-06-18: 13:00:00 4950 mg via INTRAVENOUS
  Filled 2019-06-18: qty 99

## 2019-06-18 NOTE — Patient Instructions (Signed)
[facility] Discharge Instructions for Patients Receiving Chemotherapy  Today you received the following chemotherapy agents, Irinotecan, Leucovorin, and 5FU  To help prevent nausea and vomiting after your treatment, we encourage you to take your nausea medication as directed If you develop nausea and vomiting that is not controlled by your nausea medication, call the clinic.   BELOW ARE SYMPTOMS THAT SHOULD BE REPORTED IMMEDIATELY:  *FEVER GREATER THAN 100.5 F  *CHILLS WITH OR WITHOUT FEVER  NAUSEA AND VOMITING THAT IS NOT CONTROLLED WITH YOUR NAUSEA MEDICATION  *UNUSUAL SHORTNESS OF BREATH  *UNUSUAL BRUISING OR BLEEDING  TENDERNESS IN MOUTH AND THROAT WITH OR WITHOUT PRESENCE OF ULCERS  *URINARY PROBLEMS  *BOWEL PROBLEMS  UNUSUAL RASH Items with * indicate a potential emergency and should be followed up as soon as possible.  Feel free to call the clinic should you have any questions or concerns. The clinic phone number is [phone].  Please show the [place] at check-in to the Emergency Department and triage nurse.

## 2019-06-19 ENCOUNTER — Telehealth: Payer: Self-pay | Admitting: Hematology

## 2019-06-19 LAB — CANCER ANTIGEN 19-9: CA 19-9: 3230 U/mL — ABNORMAL HIGH (ref 0–35)

## 2019-06-19 NOTE — Telephone Encounter (Signed)
Scheduled appt per 11/11 los. ° °Spoke with pt and he is aware of his appt date and time. °

## 2019-06-20 ENCOUNTER — Other Ambulatory Visit: Payer: Self-pay

## 2019-06-20 ENCOUNTER — Inpatient Hospital Stay: Payer: Medicare Other

## 2019-06-20 VITALS — BP 128/72 | HR 72 | Temp 98.2°F | Resp 18

## 2019-06-20 DIAGNOSIS — C252 Malignant neoplasm of tail of pancreas: Secondary | ICD-10-CM

## 2019-06-20 DIAGNOSIS — Z5111 Encounter for antineoplastic chemotherapy: Secondary | ICD-10-CM | POA: Diagnosis not present

## 2019-06-20 DIAGNOSIS — Z7189 Other specified counseling: Secondary | ICD-10-CM

## 2019-06-20 MED ORDER — HEPARIN SOD (PORK) LOCK FLUSH 100 UNIT/ML IV SOLN
500.0000 [IU] | Freq: Once | INTRAVENOUS | Status: AC | PRN
  Administered 2019-06-20: 500 [IU]
  Filled 2019-06-20: qty 5

## 2019-06-20 MED ORDER — PEGFILGRASTIM-CBQV 6 MG/0.6ML ~~LOC~~ SOSY
6.0000 mg | PREFILLED_SYRINGE | Freq: Once | SUBCUTANEOUS | Status: AC
  Administered 2019-06-20: 6 mg via SUBCUTANEOUS

## 2019-06-20 MED ORDER — SODIUM CHLORIDE 0.9% FLUSH
10.0000 mL | INTRAVENOUS | Status: DC | PRN
  Administered 2019-06-20: 10 mL
  Filled 2019-06-20: qty 10

## 2019-06-20 MED ORDER — PEGFILGRASTIM-CBQV 6 MG/0.6ML ~~LOC~~ SOSY
PREFILLED_SYRINGE | SUBCUTANEOUS | Status: AC
  Filled 2019-06-20: qty 0.6

## 2019-06-30 NOTE — Progress Notes (Signed)
[place]   [phone] [phone]   Clinic Follow up Note   Patient Care Team: Leonard Downing, MD as PCP - General (Family Medicine)  Date of Service:  07/09/2019  CHIEF COMPLAINT: F/u ofmetastatic pancreatic Cancer  SUMMARY OF ONCOLOGIC HISTORY: Oncology History Overview Note  Cancer Staging Pancreatic cancer [facility]) Staging form: Exocrine Pancreas, AJCC 8th Edition - Clinical stage from 10/29/2018: Stage IV (cT3, cN0, cM1) - Signed by Truitt Merle, MD on 11/05/2018     Pancreatic cancer ([place])  10/09/2018 Procedure   Upper Endoscopy by. Dr Paulita Fujita 10/29/18 IMPRESSION - There was no evidence of significant pathology in the left lobe of the liver. - There was no sign of significant pathology in the gallbladder. - Many abnormal lymph nodes were visualized in the splenic region (level 19), perigastric region and peripancreatic region. - A mass was identified in the pancreatic tail. This was staged T4 N2 Mx by endosonographic criteria. Fine needle aspiration performed.   10/10/2018 Imaging   CT Abdomen 10/10/18 IMPRESSION: 1. Uncomplicated acute diverticulitis of the proximal sigmoid colon. 2. Ill-defined low-density in the pancreatic tail measuring at least 4.7 cm, with possible punctate peripheral calcifications. This is suspicious for pancreatic neoplasm, however sequela of prior pancreatitis is also considered. Recommend further characterization with pancreatic protocol MRI when patient is able to tolerate breath hold technique. 3. 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 4. 16 mm cyst in the left lobe of the liver. Aortic Atherosclerosis (ICD10-I70.0).   10/16/2018 Imaging   MRI Abdomen at [facility] 10/16/18 IMPRESSION: 1. Mass in the tail the pancreas concerning for neoplasm. This contacts the spleen in the splenic hilum. 2. There is stranding in the fat extending from  the tumor to the celiac axis and left adrenal gland. Cannot exclude infiltrating tumor. 3. 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 4. metastatic lesion in the liver   10/29/2018 Initial Biopsy   Diagnosis 10/29/18 FINE NEEDLE ASPIRATION, ENDOSCOPIC, PANCREAS TAIL(SPECIMEN 1 OF 1 COLLECTED 10/29/18): MALIGNANT CELLS PRESENT, CONSISTENT WITH ADENOCARCINOMA. SEE COMMENT. COMMENT: THERE IS INSUFFICIENT TUMOR PRESENT FOR ADDITIONAL STUDIES.   10/29/2018 Cancer Staging   Staging form: Exocrine Pancreas, AJCC 8th Edition - Clinical stage from 10/29/2018: Stage IV (cT3, cN0, cM1) - Signed by Truitt Merle, MD on 11/05/2018   11/04/2018 Initial Diagnosis   Pancreatic cancer ([place])   11/27/2018 Imaging   CT Chest  IMPRESSION: 1.  No acute process or evidence of metastatic disease in the chest. 2. Locally advanced pancreatic carcinoma, as detailed previously. Cannot exclude superimposed pancreatitis. 3. New and enlarging liver lesions, suspicious for hepatic metastasis. 4. Aortic atherosclerosis (ICD10-I70.0), coronary artery atherosclerosis and emphysema (ICD10-J43.9).   11/28/2018 -  Chemotherapy   First line FOLFIRINOX q2weeks starting 11/28/18. Reduced dose of Oxaliplatin starting with cycle 9 due to mild thrombocytopenia and tolerance issue. Due to neuropathy and thrombocytopenia Oxaliplatin was d/c and we downgraded to FOLFIRI starting with C12 on 05/21/19.    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  The Common Hereditary Gene Panel offered by [employer] includes sequencing and/or deletion duplication testing of the following 48 genes: APC, ATM, AXIN2, BARD1, BMPR1A, BRCA1, BRCA2, BRIP1, CDH1, CDK4, CDKN2A (p14ARF), CDKN2A (p16INK4a), CHEK2, CTNNA1, DICER1, EPCAM (Deletion/duplication testing only), GREM1 (promoter region deletion/duplication testing only), KIT, MEN1,  MLH1, MSH2, MSH3, MSH6, MUTYH, NBN, NF1, NHTL1, PALB2, PDGFRA,  PMS2, POLD1, POLE, PTEN, RAD50, RAD51C, RAD51D, RNF43, SDHB, SDHC, SDHD, SMAD4, SMARCA4. STK11, TP53, TSC1, TSC2, and VHL.  The following genes were evaluated for sequence changes only: SDHA and HOXB13 c.251G>A variant only. The report date is December 04, 2018.,   02/11/2019 Imaging   CT AP W Contrast  IMPRESSION: 1. Stable appearing ill-defined pancreatic mass in the tail region extending into the splenic hilum. 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 2. The left hepatic lobe metastatic lesion is slightly larger and the right hepatic lobe lesion is slightly smaller. No new lesions are identified. 3. Small peripancreatic lymph nodes are stable. No retroperitoneal lymphadenopathy. 4. No worrisome pulmonary nodules at the lung bases and no worrisome bone lesions.   05/19/2019 Imaging   CT AP W Contrast  IMPRESSION: 4.5 cm pancreatic tail mass extending to the splenic hilum, mildly improved.   Improving hepatic metastases.   Stable abnormal soft tissue along the celiac axis, likely reflecting tumor.        CURRENT THERAPY:  First lineFOLFIRINOX q2weeks starting4/23/20. Reduced dose ofOxaliplatinstarting with cycle 9 due to mild thrombocytopenia andtolerance issue. Due to neuropathy and thrombocytopenia Oxaliplatin was d/c and we change it to FOLFIRI starting with C12 on 05/21/19 every 3-4 weeks due to [facility] and slow recovery of thrombocytopenia.   INTERVAL HISTORY:  Vincent [name] is here for a follow up and treatment. He presents to the clinic alone. He notes he is doing well. He notes he tolerated last chemo well. He notes he leaves 12/30 -1/2 for the holiday. He notes his neuropathy is still significant in fingers and feet. He has been taking Gabapentin 185m TID.     REVIEW OF SYSTEMS:   Constitutional: Denies fevers, chills or abnormal weight loss  Eyes: Denies blurriness of vision Ears, nose, mouth, throat, and face: Denies mucositis or sore throat Respiratory: Denies cough, dyspnea or wheezes Cardiovascular: Denies palpitation, chest discomfort or lower extremity swelling Gastrointestinal:  Denies nausea, heartburn or change in bowel habits Skin: Denies abnormal skin rashes Lymphatics: Denies new lymphadenopathy or easy bruising Neurological: (+) Neuropathy of fingers and feet  Behavioral/Psych: Mood is stable, no new changes  All other systems were reviewed with the patient and are negative.  MEDICAL HISTORY:  Past Medical History:  Diagnosis Date  . [place]    pt. states that he is not diabeticalthought he takes metformin  . Eye hemorrhage    right  . Family history of lung cancer   . Hypertension   . Monoallelic mutation of BRIP1 gene   . Monoallelic mutation of MUTYH gene     SURGICAL HISTORY: Past Surgical History:  Procedure Laterality Date  . ESOPHAGOGASTRODUODENOSCOPY (EGD) WITH PROPOFOL N/A 10/29/2018   Procedure: ESOPHAGOGASTRODUODENOSCOPY (EGD) WITH PROPOFOL;  Surgeon: OArta Silence MD;  Location: [facility];  Service: Endoscopy;  Laterality: N/A;  . EUS N/A 10/29/2018   Procedure: FULL UPPER ENDOSCOPIC ULTRASOUND (EUS) RADIAL;  Surgeon: OArta Silence MD;  Location: [facility];  Service: Endoscopy;  Laterality: N/A;  . FINE NEEDLE ASPIRATION N/A 10/29/2018   Procedure: FINE NEEDLE ASPIRATION (FNA) LINEAR;  Surgeon: OArta Silence MD;  Location: [facility];  Service: Endoscopy;  Laterality: N/A;  . IR IMAGING GUIDED PORT INSERTION  11/11/2018  . LUMBAR LAMINECTOMY/DECOMPRESSION MICRODISCECTOMY Left 10/11/2017   Procedure: Microlumbar decompression Lumbar Four-Five Left;  Surgeon: BSusa Day MD;  [facility]  Service: Orthopedics;  Laterality: Left;  Microlumbar decompression Lumbar Four-Five Left  .  removal of wisdom teeth  1977    I have reviewed the  social history and family history with the patient and they are unchanged from previous note.  ALLERGIES:  has No Known Allergies.  MEDICATIONS:  Current Outpatient Medications  Medication Sig Dispense Refill  . Accu-Chek FastClix Lancets MISC daily. for testing    . aspirin EC 81 MG tablet Take 1 tablet (81 mg total) by mouth daily after breakfast. Resume 4 days post-op    . cephALEXin (KEFLEX) 500 MG capsule Take 1 capsule (500 mg total) by mouth 3 (three) times daily. 21 capsule 0  . gabapentin (NEURONTIN) 100 MG capsule Take 1 capsule (100 mg total) by mouth at bedtime. May gradually increase to 300 mg at night (Patient taking differently: Take 200 mg by mouth at bedtime. May gradually increase to 300 mg at night) 90 capsule 1  . Ginger, Zingiber officinalis, (GINGER ROOT PO) Take 750 mg by mouth.    Marland Kitchen KLOR-CON M20 20 MEQ tablet TAKE 1 TABLET (20 MEQ TOTAL) BY MOUTH EVERY OTHER DAY. 30 tablet 0  . lidocaine-prilocaine (EMLA) cream Apply to affected area once 30 g 3  . lisinopril (PRINIVIL,ZESTRIL) 10 MG tablet Take 10 mg by mouth daily after breakfast.    . metFORMIN (GLUCOPHAGE) 1000 MG tablet TAKE 1 TABLET BY MOUTH AT BREAKFAST AND 1/2 TABLET AT SUPPER    . ondansetron (ZOFRAN) 8 MG tablet Take 1 tablet (8 mg total) by mouth 2 (two) times daily as needed for refractory nausea / vomiting. Start on day 3 after chemotherapy. 30 tablet 1  . prochlorperazine (COMPAZINE) 10 MG tablet Take 1 tablet (10 mg total) by mouth every 6 (six) hours as needed (NAUSEA). 30 tablet 1  . simvastatin (ZOCOR) 40 MG tablet Take 20 mg by mouth every evening.    . traMADol (ULTRAM) 50 MG tablet Take 2 tablets (100 mg total) by mouth every 6 (six) hours as needed. 60 tablet 0   No current facility-administered medications for this visit.    Facility-Administered Medications Ordered in Other Visits  Medication Dose Route Frequency Provider Last Rate Last Dose  . atropine injection 0.4 mg  0.4 mg Intravenous Once  PRN Truitt Merle, MD      . dextrose 5 % solution   Intravenous Once Truitt Merle, MD      . dextrose 5 % solution   Intravenous Once Truitt Merle, MD      . fluorouracil (ADRUCIL) 4,950 mg in sodium chloride 0.9 % 51 mL chemo infusion  2,200 mg/m2 (Treatment Plan Recorded) Intravenous 1 day or 1 dose Truitt Merle, MD      . fosaprepitant (EMEND) 150 mg, dexamethasone (DECADRON) 10 mg in sodium chloride 0.9 % 145 mL IVPB   Intravenous Once Truitt Merle, MD      . irinotecan (CAMPTOSAR) 300 mg in dextrose 5 % 500 mL chemo infusion  130 mg/m2 (Treatment Plan Recorded) Intravenous Once Truitt Merle, MD      . leucovorin 904 mg in dextrose 5 % 250 mL infusion  400 mg/m2 (Treatment Plan Recorded) Intravenous Once Truitt Merle, MD      . palonosetron (ALOXI) injection 0.25 mg  0.25 mg Intravenous Once Truitt Merle, MD        PHYSICAL EXAMINATION: ECOG PERFORMANCE STATUS: 0 - Asymptomatic  Vitals:   07/09/19 0833  BP: (!) 138/96  Pulse: 77  Resp: 17  Temp: 99.6 F (37.6 C)  SpO2: 99%   Filed Weights   07/09/19  0833  Weight: 230 lb 8 oz (104.6 kg)    GENERAL:alert, no distress and comfortable SKIN: skin color, texture, turgor are normal, no rashes or significant lesions EYES: normal, Conjunctiva are pink and non-injected, sclera clear  NECK: supple, thyroid normal size, non-tender, without nodularity LYMPH:  no palpable lymphadenopathy in the cervical, axillary  LUNGS: clear to auscultation and percussion with normal breathing effort HEART: regular rate & rhythm and no murmurs and no lower extremity edema ABDOMEN:abdomen soft, non-tender and normal bowel sounds Musculoskeletal:no cyanosis of digits and no clubbing  NEURO: alert & oriented x 3 with fluent speech, no focal motor/sensory deficits  LABORATORY DATA:  I have reviewed the data as listed CBC Latest Ref Rng & Units 07/09/2019 06/18/2019 05/21/2019  WBC 4.0 - 10.5 K/uL 4.0 3.3(L) 5.6  Hemoglobin 13.0 - 17.0 g/dL 11.8(L) 11.2(L) 10.9(L)  Hematocrit  39.0 - 52.0 % 36.5(L) 34.5(L) 33.2(L)  Platelets 150 - 400 K/uL 143(L) 102(L) 86(L)     CMP Latest Ref Rng & Units 07/09/2019 06/18/2019 05/21/2019  Glucose 70 - 99 mg/dL 160(H) 153(H) 121(H)  BUN 8 - 23 mg/dL '16 18 12  ' Creatinine 0.61 - 1.24 mg/dL 0.83 0.85 0.87  Sodium 135 - 145 mmol/L 141 139 142  Potassium 3.5 - 5.1 mmol/L 4.5 4.2 4.0  Chloride 98 - 111 mmol/L 106 108 106  CO2 22 - 32 mmol/L '26 23 27  ' Calcium 8.9 - 10.3 mg/dL 8.7(L) 8.5(L) 8.8(L)  Total Protein 6.5 - 8.1 g/dL 6.8 6.6 6.6  Total Bilirubin 0.3 - 1.2 mg/dL 0.3 0.5 0.5  Alkaline Phos 38 - 126 U/L 129(H) 113 122  AST 15 - 41 U/L 16 14(L) 17  ALT 0 - 44 U/L '19 18 21      ' RADIOGRAPHIC STUDIES: I have personally reviewed the radiological images as listed and agreed with the findings in the report. No results found.   ASSESSMENT & PLAN:  Vincent [name] is a 68 y.o. male with   1. Adenocarcinoma oftail of Pancreas, cT3N0Mx, with liver metastasis -He was diagnosedin3/2020.  -His CT APand abdominal MRI showed a 1.6cm lesion in thedorm, althoughit appears likely a benign cyst on the CT scan, however the MRI features are concerning for livermetastasis. -Ipreviouslydiscussed to confirm with definitive prognosis we can do liver biopsy. Given thetypical scan findings and growth over time, the liver lesions are likely metastatic lesions. Given the current COVID-19,I will probablyskip biopsy. Heagreed. -He understands if metastatic disease, he is no longer a surgical candidate and his cancer is no longer curable. Goal of therapy would be palliative toprolong his life and improve his quality of life.  -He started first-line FOLFIRINOX every 2 weeks on 11/28/18. Given neuropathy and thrombocytopenia, d/c Oxaliplatin C12 and proceed with FOLFIRI. He has been on chemo every 3-4 weeks lately due to holiday and cytopenias  -He is tolerating FOLFIRI much better. Labs reviewed with improving thrombocytopenia. His Ca  19.9 has increased further 3 weeks ago. Overall adequate to proceed with C14 FOLFIRI today. After the holidays will return to every 2 weeks consistently and plan to scan him in 08/2019. He is agreeable.  -F/u in 2 weeks    2. Epigastricand LUQpainradiating to his back, Secondary topancreaticcancer -He is no longer taking Norco or oxycodone  -Abdominal painhasmostlyresolved since he started chemo, backand musclepain manageable with tramadolfor significant pain every 2-3 days if needed.He takes Tylenol for mild to moderate pain. -I advised him not to go over 8 tabs Tramadol in a day. -  Stable   3. Lower appetite, Weight loss  -Eating exacerbates his epigastric pain and nausea -Hewas initially at 230 pounds at baseline -Continue tofollow up with Dietician -Appetitehas been adequate.His weight has been trending up lately.   4. DM, HTN  -On Metformin and Lisinopril -I advised him to avoid sweet during the first 3-4 of each cycle given steroid premeds. -Continue to f/u with PCP  5. Hypokalemia  -will continue toincrease potassium in his dietand continue oral potassium -Given potassium has been normal lately, he is fine to reduce potassium to MWF.   6. 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  7. Neuropathy, G2 -Waspreviouslyintermittent but s/p C11 then worsened. Oxaliplatin d/c since C12.  -His Neuropathy is significant but manageable. He is currently taking Gabapentin 161m TID. I  encouraged him to increase Gabapentin to 2032mat night and if tolerable he can titrate up to 20039mID.   8. Right big toeparonychia -Has been ongoing for 1-2 weeks.  -He plans to be seen by podiatrist soon for evaluation or treatment.  -I previously called in Keflex and encouraged him to use warm water vinegar soak.   9. Thrombocytopenia   -Secondary to chemotherapy. Oxaliplatin d/c since C12. -Levels have improved with dose reduction. I discussed when we return to every 2 weeks treatment his counts may decrease again. If that happens I will start him on Promacta.   10.Goal of care discussion  -The patient understands the goal of care is palliative. -He is full code now  PLAN: -Labs reviewed and adequate to proceed with C14 FOLFIRI with Udenyca on day 3 -Lab, flush, f/u and FOLFIRI in 2, 5 weeks  -CT CAP a few days before 1/6  -will prescribe promacta 42m79mily to see if his insurance will cover    No problem-specific Assessment & Plan notes found for this encounter.   Orders Placed This Encounter  Procedures  . CT Abdomen Pelvis W Contrast    Standing Status:   Future    Standing Expiration Date:   07/08/2020    Order Specific Question:   If indicated for the ordered procedure, I authorize the administration of contrast media per Radiology protocol    Answer:   Yes    Order Specific Question:   [facility]    [facility]   Is Oral Contrast requested for this exam?    Answer:   Yes, Per Radiology protocol    Order Specific Question:   Radiology Contrast Protocol - do NOT remove file path    Answer:   \\[email or web address]  . CT Chest W Contrast    Standing Status:   Future    Standing Expiration Date:   07/08/2020    Order Specific Question:   If indicated for the ordered procedure, I authorize the administration of contrast media per Radiology protocol    Answer:   Yes    Order Specific Question:   [facility]?    [facility]   Radiology Contrast Protocol - do NOT remove file path    Answer:   \\[email or web address]   All questions were answered. The patient knows to call the clinic with any problems, questions or concerns. No barriers to  learning was detected. I spent 20 minutes counseling the patient face to face. The  total time spent in the appointment was 25 minutes and more than 50% was on counseling and review of test results     Truitt Merle, MD 07/09/2019   I, Joslyn Devon, am acting as scribe for Truitt Merle, MD.   I have reviewed the above documentation for accuracy and completeness, and I agree with the above.

## 2019-07-09 ENCOUNTER — Other Ambulatory Visit: Payer: Self-pay

## 2019-07-09 ENCOUNTER — Inpatient Hospital Stay: Payer: Medicare Other | Attending: Hematology

## 2019-07-09 ENCOUNTER — Encounter: Payer: Self-pay | Admitting: Hematology

## 2019-07-09 ENCOUNTER — Inpatient Hospital Stay (HOSPITAL_BASED_OUTPATIENT_CLINIC_OR_DEPARTMENT_OTHER): Payer: Medicare Other | Admitting: Hematology

## 2019-07-09 ENCOUNTER — Inpatient Hospital Stay: Payer: Medicare Other

## 2019-07-09 VITALS — BP 138/96 | HR 77 | Temp 99.6°F | Resp 17 | Ht 76.0 in | Wt 230.5 lb

## 2019-07-09 DIAGNOSIS — I10 Essential (primary) hypertension: Secondary | ICD-10-CM | POA: Diagnosis not present

## 2019-07-09 DIAGNOSIS — Z7984 Long term (current) use of oral hypoglycemic drugs: Secondary | ICD-10-CM | POA: Insufficient documentation

## 2019-07-09 DIAGNOSIS — E1140 Type 2 diabetes mellitus with diabetic neuropathy, unspecified: Secondary | ICD-10-CM | POA: Insufficient documentation

## 2019-07-09 DIAGNOSIS — C250 Malignant neoplasm of head of pancreas: Secondary | ICD-10-CM | POA: Diagnosis not present

## 2019-07-09 DIAGNOSIS — Z79899 Other long term (current) drug therapy: Secondary | ICD-10-CM | POA: Insufficient documentation

## 2019-07-09 DIAGNOSIS — T451X5A Adverse effect of antineoplastic and immunosuppressive drugs, initial encounter: Secondary | ICD-10-CM | POA: Diagnosis not present

## 2019-07-09 DIAGNOSIS — Z7189 Other specified counseling: Secondary | ICD-10-CM

## 2019-07-09 DIAGNOSIS — L03031 Cellulitis of right toe: Secondary | ICD-10-CM | POA: Diagnosis not present

## 2019-07-09 DIAGNOSIS — Z5111 Encounter for antineoplastic chemotherapy: Secondary | ICD-10-CM | POA: Diagnosis present

## 2019-07-09 DIAGNOSIS — D6959 Other secondary thrombocytopenia: Secondary | ICD-10-CM | POA: Diagnosis not present

## 2019-07-09 DIAGNOSIS — E876 Hypokalemia: Secondary | ICD-10-CM | POA: Diagnosis not present

## 2019-07-09 DIAGNOSIS — C252 Malignant neoplasm of tail of pancreas: Secondary | ICD-10-CM

## 2019-07-09 DIAGNOSIS — G893 Neoplasm related pain (acute) (chronic): Secondary | ICD-10-CM | POA: Diagnosis not present

## 2019-07-09 DIAGNOSIS — C787 Secondary malignant neoplasm of liver and intrahepatic bile duct: Secondary | ICD-10-CM | POA: Insufficient documentation

## 2019-07-09 DIAGNOSIS — Z95828 Presence of other vascular implants and grafts: Secondary | ICD-10-CM

## 2019-07-09 LAB — CBC WITH DIFFERENTIAL (CANCER CENTER ONLY)
Abs Immature Granulocytes: 0.01 10*3/uL (ref 0.00–0.07)
Basophils Absolute: 0.1 10*3/uL (ref 0.0–0.1)
Basophils Relative: 2 %
Eosinophils Absolute: 0.2 10*3/uL (ref 0.0–0.5)
Eosinophils Relative: 5 %
HCT: 36.5 % — ABNORMAL LOW (ref 39.0–52.0)
Hemoglobin: 11.8 g/dL — ABNORMAL LOW (ref 13.0–17.0)
Immature Granulocytes: 0 %
Lymphocytes Relative: 24 %
Lymphs Abs: 1 10*3/uL (ref 0.7–4.0)
MCH: 34.7 pg — ABNORMAL HIGH (ref 26.0–34.0)
MCHC: 32.3 g/dL (ref 30.0–36.0)
MCV: 107.4 fL — ABNORMAL HIGH (ref 80.0–100.0)
Monocytes Absolute: 0.5 10*3/uL (ref 0.1–1.0)
Monocytes Relative: 11 %
Neutro Abs: 2.3 10*3/uL (ref 1.7–7.7)
Neutrophils Relative %: 58 %
Platelet Count: 143 10*3/uL — ABNORMAL LOW (ref 150–400)
RBC: 3.4 MIL/uL — ABNORMAL LOW (ref 4.22–5.81)
RDW: 12.9 % (ref 11.5–15.5)
WBC Count: 4 10*3/uL (ref 4.0–10.5)
nRBC: 0 % (ref 0.0–0.2)

## 2019-07-09 LAB — CMP (CANCER CENTER ONLY)
ALT: 19 U/L (ref 0–44)
AST: 16 U/L (ref 15–41)
Albumin: 3.6 g/dL (ref 3.5–5.0)
Alkaline Phosphatase: 129 U/L — ABNORMAL HIGH (ref 38–126)
Anion gap: 9 (ref 5–15)
BUN: 16 mg/dL (ref 8–23)
CO2: 26 mmol/L (ref 22–32)
Calcium: 8.7 mg/dL — ABNORMAL LOW (ref 8.9–10.3)
Chloride: 106 mmol/L (ref 98–111)
Creatinine: 0.83 mg/dL (ref 0.61–1.24)
GFR, Est AFR Am: >60 mL/min (ref >60)
GFR, Estimated: >60 mL/min (ref >60)
Glucose, Bld: 160 mg/dL — ABNORMAL HIGH (ref 70–99)
Potassium: 4.5 mmol/L (ref 3.5–5.1)
Sodium: 141 mmol/L (ref 135–145)
Total Bilirubin: 0.3 mg/dL (ref 0.3–1.2)
Total Protein: 6.8 g/dL (ref 6.5–8.1)

## 2019-07-09 MED ORDER — ELTROMBOPAG OLAMINE 25 MG PO TABS: 25.0000 mg | ORAL_TABLET | Freq: Every day | ORAL | 0 refills | Status: DC

## 2019-07-09 MED ORDER — ATROPINE SULFATE 0.4 MG/ML IJ SOLN
0.4000 mg | Freq: Once | INTRAMUSCULAR | Status: AC | PRN
  Administered 2019-07-09: 0.4 mg via INTRAVENOUS

## 2019-07-09 MED ORDER — ATROPINE SULFATE 0.4 MG/ML IJ SOLN
INTRAMUSCULAR | Status: AC
  Filled 2019-07-09: qty 1

## 2019-07-09 MED ORDER — LEUCOVORIN CALCIUM INJECTION 350 MG
432.0000 mg/m2 | Freq: Once | INTRAVENOUS | Status: AC
  Administered 2019-07-09: 976 mg via INTRAVENOUS
  Filled 2019-07-09: qty 48.8

## 2019-07-09 MED ORDER — PALONOSETRON HCL INJECTION 0.25 MG/5ML
INTRAVENOUS | Status: AC
  Filled 2019-07-09: qty 5

## 2019-07-09 MED ORDER — IRINOTECAN HCL CHEMO INJECTION 100 MG/5ML
130.0000 mg/m2 | Freq: Once | INTRAVENOUS | Status: AC
  Administered 2019-07-09: 300 mg via INTRAVENOUS
  Filled 2019-07-09: qty 15

## 2019-07-09 MED ORDER — DEXTROSE 5 % IV SOLN
Freq: Once | INTRAVENOUS | Status: AC
  Administered 2019-07-09: 10:00:00 via INTRAVENOUS
  Filled 2019-07-09: qty 250

## 2019-07-09 MED ORDER — PALONOSETRON HCL INJECTION 0.25 MG/5ML
0.2500 mg | Freq: Once | INTRAVENOUS | Status: AC
  Administered 2019-07-09: 0.25 mg via INTRAVENOUS

## 2019-07-09 MED ORDER — SODIUM CHLORIDE 0.9 % IV SOLN
2200.0000 mg/m2 | INTRAVENOUS | Status: DC
  Administered 2019-07-09: 4950 mg via INTRAVENOUS
  Filled 2019-07-09: qty 99

## 2019-07-09 MED ORDER — SODIUM CHLORIDE 0.9 % IV SOLN
Freq: Once | INTRAVENOUS | Status: AC
  Administered 2019-07-09: 10:00:00 via INTRAVENOUS
  Filled 2019-07-09: qty 5

## 2019-07-09 MED ORDER — SODIUM CHLORIDE 0.9% FLUSH
10.0000 mL | Freq: Once | INTRAVENOUS | Status: AC
  Administered 2019-07-09: 10 mL
  Filled 2019-07-09: qty 10

## 2019-07-09 NOTE — Patient Instructions (Signed)
[facility] Discharge Instructions for Patients Receiving Chemotherapy  Today you received the following chemotherapy agents, Irinotecan, Leucovorin, and 5FU  To help prevent nausea and vomiting after your treatment, we encourage you to take your nausea medication as directed If you develop nausea and vomiting that is not controlled by your nausea medication, call the clinic.   BELOW ARE SYMPTOMS THAT SHOULD BE REPORTED IMMEDIATELY:  *FEVER GREATER THAN 100.5 F  *CHILLS WITH OR WITHOUT FEVER  NAUSEA AND VOMITING THAT IS NOT CONTROLLED WITH YOUR NAUSEA MEDICATION  *UNUSUAL SHORTNESS OF BREATH  *UNUSUAL BRUISING OR BLEEDING  TENDERNESS IN MOUTH AND THROAT WITH OR WITHOUT PRESENCE OF ULCERS  *URINARY PROBLEMS  *BOWEL PROBLEMS  UNUSUAL RASH Items with * indicate a potential emergency and should be followed up as soon as possible.  Feel free to call the clinic should you have any questions or concerns. The clinic phone number is [phone].  Please show the [place] at check-in to the Emergency Department and triage nurse.

## 2019-07-10 ENCOUNTER — Telehealth: Payer: Self-pay | Admitting: Hematology

## 2019-07-10 LAB — CANCER ANTIGEN 19-9: CA 19-9: 7467 U/mL — ABNORMAL HIGH (ref 0–35)

## 2019-07-10 NOTE — Telephone Encounter (Signed)
Scheduled appt per 12/2 los.  Spoke with pt and he is aware of the appt dates and time.  Sent treatment as add-on for 12/16.

## 2019-07-11 ENCOUNTER — Other Ambulatory Visit: Payer: Self-pay

## 2019-07-11 ENCOUNTER — Inpatient Hospital Stay: Payer: Medicare Other

## 2019-07-11 VITALS — BP 130/73 | HR 72 | Resp 16

## 2019-07-11 DIAGNOSIS — Z5111 Encounter for antineoplastic chemotherapy: Secondary | ICD-10-CM | POA: Diagnosis not present

## 2019-07-11 DIAGNOSIS — C252 Malignant neoplasm of tail of pancreas: Secondary | ICD-10-CM

## 2019-07-11 DIAGNOSIS — Z7189 Other specified counseling: Secondary | ICD-10-CM

## 2019-07-11 MED ORDER — PEGFILGRASTIM-CBQV 6 MG/0.6ML ~~LOC~~ SOSY
PREFILLED_SYRINGE | SUBCUTANEOUS | Status: AC
  Filled 2019-07-11: qty 0.6

## 2019-07-11 MED ORDER — SODIUM CHLORIDE 0.9% FLUSH
10.0000 mL | INTRAVENOUS | Status: DC | PRN
  Administered 2019-07-11: 10 mL
  Filled 2019-07-11: qty 10

## 2019-07-11 MED ORDER — PEGFILGRASTIM-CBQV 6 MG/0.6ML ~~LOC~~ SOSY
6.0000 mg | PREFILLED_SYRINGE | Freq: Once | SUBCUTANEOUS | Status: AC
  Administered 2019-07-11: 6 mg via SUBCUTANEOUS

## 2019-07-11 MED ORDER — HEPARIN SOD (PORK) LOCK FLUSH 100 UNIT/ML IV SOLN
500.0000 [IU] | Freq: Once | INTRAVENOUS | Status: AC | PRN
  Administered 2019-07-11: 11:00:00 500 [IU]
  Filled 2019-07-11: qty 5

## 2019-07-18 ENCOUNTER — Telehealth: Payer: Self-pay

## 2019-07-18 ENCOUNTER — Other Ambulatory Visit: Payer: Self-pay | Admitting: Nurse Practitioner

## 2019-07-18 NOTE — Telephone Encounter (Signed)
Spoke with [name], he verified he has no prescription insurance until Jan 2021. Informed him of the [employer], asked for his permission to fill out [name] portion online and household and income.  [name] stated he was on the road, asked if I would call back to discuss sometime next week.  Vincent [name] [place] Phone [phone] Fax [phone] 07/18/2019 2:44 PM

## 2019-07-21 ENCOUNTER — Telehealth: Payer: Self-pay | Admitting: Pharmacist

## 2019-07-21 DIAGNOSIS — C252 Malignant neoplasm of tail of pancreas: Secondary | ICD-10-CM

## 2019-07-21 NOTE — Telephone Encounter (Signed)
Oral Oncology Pharmacist Encounter  Received a message from Dr. Ernestina Penna RN stating that Mr. Vanous called to say his Antony Blackbird was denied. Promacta prescription was sent to local [facility] so we were not aware of the prescription. Wynn Maudlin, patient advocate, looked into insurance coverage for Mr. Nicolosi and could not find any prescription coverage. He will need to apply for manufacturer assistance for the Promacta. This will be coordinated.   Reviewed new prescription for Promacta (eltrombopag) for the treatment of thrombocytopenia, planned duration until no longer responding or unacceptable drug toxicity. The goal is to use eltrombopag to prevent thrombocytopenia to allow patient to receive chemotherapy as planned every 2 weeks,  CBC/CMP from 07/09/2019 assessed, no relevant lab abnormalities. Prescription dose and frequency assessed.   Current medication list in Epic reviewed, one DDIs with eltrombopag identified: Simvastatin: Eltrombopag may increased the concentration of simvastatin. Monitor patient for adverse events related to simvastatin. No baseline dose adjustment needed.   Oral Oncology Clinic will continue to follow for manufacturer assistance, initial counseling, and start date.  Darl Pikes, PharmD, BCPS, [facility] Hematology/Oncology Clinical Pharmacist [facility]/HP/AP Oral [facility] [phone]  07/21/2019 12:43 PM

## 2019-07-22 ENCOUNTER — Telehealth: Payer: Self-pay

## 2019-07-22 NOTE — Telephone Encounter (Signed)
Oral Oncology Patient Advocate Encounter  Spoke to patient who authorized me to complete an online application for [employer] Patient [place] (NPAF) in an effort to reduce the patient's out of pocket expense for Promacta to $0.    Application completed and submitted online  [employer]PA[employer] phone number for follow up is [phone].   This encounter will be updated until final determination.   [place] Patient [place] Phone [phone] Fax [phone] 07/22/2019 5:09 PM

## 2019-07-23 ENCOUNTER — Inpatient Hospital Stay: Payer: Medicare Other

## 2019-07-23 ENCOUNTER — Encounter: Payer: Self-pay | Admitting: Nurse Practitioner

## 2019-07-23 ENCOUNTER — Telehealth: Payer: Self-pay

## 2019-07-23 ENCOUNTER — Inpatient Hospital Stay (HOSPITAL_BASED_OUTPATIENT_CLINIC_OR_DEPARTMENT_OTHER): Payer: Medicare Other | Admitting: Nurse Practitioner

## 2019-07-23 ENCOUNTER — Other Ambulatory Visit: Payer: Self-pay

## 2019-07-23 ENCOUNTER — Telehealth: Payer: Self-pay | Admitting: Nurse Practitioner

## 2019-07-23 VITALS — BP 127/91 | HR 72 | Temp 98.5°F | Resp 18

## 2019-07-23 DIAGNOSIS — C250 Malignant neoplasm of head of pancreas: Secondary | ICD-10-CM | POA: Diagnosis not present

## 2019-07-23 DIAGNOSIS — Z7189 Other specified counseling: Secondary | ICD-10-CM

## 2019-07-23 DIAGNOSIS — C252 Malignant neoplasm of tail of pancreas: Secondary | ICD-10-CM

## 2019-07-23 DIAGNOSIS — Z95828 Presence of other vascular implants and grafts: Secondary | ICD-10-CM

## 2019-07-23 DIAGNOSIS — Z5111 Encounter for antineoplastic chemotherapy: Secondary | ICD-10-CM | POA: Diagnosis not present

## 2019-07-23 LAB — CBC WITH DIFFERENTIAL (CANCER CENTER ONLY)
Abs Immature Granulocytes: 0.02 10*3/uL (ref 0.00–0.07)
Basophils Absolute: 0.1 10*3/uL (ref 0.0–0.1)
Basophils Relative: 1 %
Eosinophils Absolute: 0.2 10*3/uL (ref 0.0–0.5)
Eosinophils Relative: 4 %
HCT: 34.4 % — ABNORMAL LOW (ref 39.0–52.0)
Hemoglobin: 11.3 g/dL — ABNORMAL LOW (ref 13.0–17.0)
Immature Granulocytes: 0 %
Lymphocytes Relative: 15 %
Lymphs Abs: 0.9 10*3/uL (ref 0.7–4.0)
MCH: 34.7 pg — ABNORMAL HIGH (ref 26.0–34.0)
MCHC: 32.8 g/dL (ref 30.0–36.0)
MCV: 105.5 fL — ABNORMAL HIGH (ref 80.0–100.0)
Monocytes Absolute: 0.4 10*3/uL (ref 0.1–1.0)
Monocytes Relative: 8 %
Neutro Abs: 4.3 10*3/uL (ref 1.7–7.7)
Neutrophils Relative %: 72 %
Platelet Count: 106 10*3/uL — ABNORMAL LOW (ref 150–400)
RBC: 3.26 MIL/uL — ABNORMAL LOW (ref 4.22–5.81)
RDW: 13 % (ref 11.5–15.5)
WBC Count: 5.9 10*3/uL (ref 4.0–10.5)
nRBC: 0 % (ref 0.0–0.2)

## 2019-07-23 LAB — CMP (CANCER CENTER ONLY)
ALT: 16 U/L (ref 0–44)
AST: 14 U/L — ABNORMAL LOW (ref 15–41)
Albumin: 3.6 g/dL (ref 3.5–5.0)
Alkaline Phosphatase: 116 U/L (ref 38–126)
Anion gap: 10 (ref 5–15)
BUN: 15 mg/dL (ref 8–23)
CO2: 25 mmol/L (ref 22–32)
Calcium: 8.6 mg/dL — ABNORMAL LOW (ref 8.9–10.3)
Chloride: 106 mmol/L (ref 98–111)
Creatinine: 0.84 mg/dL (ref 0.61–1.24)
GFR, Est AFR Am: >60 mL/min (ref >60)
GFR, Estimated: >60 mL/min (ref >60)
Glucose, Bld: 144 mg/dL — ABNORMAL HIGH (ref 70–99)
Potassium: 4.3 mmol/L (ref 3.5–5.1)
Sodium: 141 mmol/L (ref 135–145)
Total Bilirubin: 0.4 mg/dL (ref 0.3–1.2)
Total Protein: 6.5 g/dL (ref 6.5–8.1)

## 2019-07-23 MED ORDER — SODIUM CHLORIDE 0.9% FLUSH
10.0000 mL | Freq: Once | INTRAVENOUS | Status: AC
  Administered 2019-07-23: 08:00:00 10 mL
  Filled 2019-07-23: qty 10

## 2019-07-23 MED ORDER — GABAPENTIN 100 MG PO CAPS: 300.0000 mg | ORAL_CAPSULE | Freq: Every day | ORAL | 3 refills | Status: AC

## 2019-07-23 MED ORDER — SODIUM CHLORIDE 0.9 % IV SOLN
2212.0000 mg/m2 | INTRAVENOUS | Status: DC
  Administered 2019-07-23: 12:00:00 5000 mg via INTRAVENOUS
  Filled 2019-07-23: qty 100

## 2019-07-23 MED ORDER — PALONOSETRON HCL INJECTION 0.25 MG/5ML
0.2500 mg | Freq: Once | INTRAVENOUS | Status: AC
  Administered 2019-07-23: 0.25 mg via INTRAVENOUS

## 2019-07-23 MED ORDER — ATROPINE SULFATE 0.4 MG/ML IJ SOLN
0.4000 mg | Freq: Once | INTRAMUSCULAR | Status: AC | PRN
  Administered 2019-07-23: 11:00:00 0.4 mg via INTRAVENOUS

## 2019-07-23 MED ORDER — SODIUM CHLORIDE 0.9 % IV SOLN
Freq: Once | INTRAVENOUS | Status: AC
  Filled 2019-07-23: qty 5

## 2019-07-23 MED ORDER — SODIUM CHLORIDE 0.9 % IV SOLN
130.0000 mg/m2 | Freq: Once | INTRAVENOUS | Status: AC
  Administered 2019-07-23: 11:00:00 300 mg via INTRAVENOUS
  Filled 2019-07-23: qty 15

## 2019-07-23 MED ORDER — ELTROMBOPAG OLAMINE 25 MG PO TABS: 25.0000 mg | ORAL_TABLET | Freq: Every day | ORAL | 0 refills | Status: DC

## 2019-07-23 MED ORDER — PALONOSETRON HCL INJECTION 0.25 MG/5ML
INTRAVENOUS | Status: AC
  Filled 2019-07-23: qty 5

## 2019-07-23 MED ORDER — SODIUM CHLORIDE 0.9 % IV SOLN
400.0000 mg/m2 | Freq: Once | INTRAVENOUS | Status: AC
  Administered 2019-07-23: 11:00:00 904 mg via INTRAVENOUS
  Filled 2019-07-23: qty 45.2

## 2019-07-23 MED ORDER — SODIUM CHLORIDE 0.9% FLUSH
10.0000 mL | INTRAVENOUS | Status: DC | PRN
  Filled 2019-07-23: qty 10

## 2019-07-23 MED ORDER — DEXTROSE 5 % IV SOLN
Freq: Once | INTRAVENOUS | Status: AC
  Filled 2019-07-23: qty 250

## 2019-07-23 MED ORDER — ATROPINE SULFATE 0.4 MG/ML IJ SOLN
INTRAMUSCULAR | Status: AC
  Filled 2019-07-23: qty 1

## 2019-07-23 NOTE — Telephone Encounter (Signed)
TC from Pt stating he still has a problem getting the promacta. Pt stated that his insurance will not cover it until 08/08/19. Informed Lacie who stated she will give pharmacy a call to see if we can get him the medication until 08/08/19 informed Pt we will call him back when we get more information. Pt verbalized understanding.

## 2019-07-23 NOTE — Progress Notes (Signed)
Vincent [name]   [phone] [phone]   Clinic Follow up Note   Patient Care Team: Leonard Downing, MD as PCP - General (Family Medicine) 07/23/2019  CHIEF COMPLAINT: F/u metastatic pancreatic cancer  SUMMARY OF ONCOLOGIC HISTORY: Oncology History Overview Note  Cancer Staging Pancreatic cancer [facility]) Staging form: Exocrine Pancreas, AJCC 8th Edition - Clinical stage from 10/29/2018: Stage IV (cT3, cN0, cM1) - Signed by Truitt Merle, MD on 11/05/2018     Pancreatic cancer ([place])  10/09/2018 Procedure   Upper Endoscopy by. Dr Paulita Fujita 10/29/18 IMPRESSION - There was no evidence of significant pathology in the left lobe of the liver. - There was no sign of significant pathology in the gallbladder. - Many abnormal lymph nodes were visualized in the splenic region (level 19), perigastric region and peripancreatic region. - A mass was identified in the pancreatic tail. This was staged T4 N2 Mx by endosonographic criteria. Fine needle aspiration performed.   10/10/2018 Imaging   CT Abdomen 10/10/18 IMPRESSION: 1. Uncomplicated acute diverticulitis of the proximal sigmoid colon. 2. Ill-defined low-density in the pancreatic tail measuring at least 4.7 cm, with possible punctate peripheral calcifications. This is suspicious for pancreatic neoplasm, however sequela of prior pancreatitis is also considered. Recommend further characterization with pancreatic protocol MRI when patient is able to tolerate breath hold technique. 3. 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 4. 16 mm cyst in the left lobe of the liver. Aortic Atherosclerosis (ICD10-I70.0).   10/16/2018 Imaging   MRI Abdomen at [facility] 10/16/18 IMPRESSION: 1. Mass in the tail the pancreas concerning for neoplasm. This contacts the spleen in the splenic hilum. 2. There is stranding in the fat extending from the tumor to the celiac  axis and left adrenal gland. Cannot exclude infiltrating tumor. 3. 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 4. metastatic lesion in the liver   10/29/2018 Initial Biopsy   Diagnosis 10/29/18 FINE NEEDLE ASPIRATION, ENDOSCOPIC, PANCREAS TAIL(SPECIMEN 1 OF 1 COLLECTED 10/29/18): MALIGNANT CELLS PRESENT, CONSISTENT WITH ADENOCARCINOMA. SEE COMMENT. COMMENT: THERE IS INSUFFICIENT TUMOR PRESENT FOR ADDITIONAL STUDIES.   10/29/2018 Cancer Staging   Staging form: Exocrine Pancreas, AJCC 8th Edition - Clinical stage from 10/29/2018: Stage IV (cT3, cN0, cM1) - Signed by Truitt Merle, MD on 11/05/2018   11/04/2018 Initial Diagnosis   Pancreatic cancer ([place])   11/27/2018 Imaging   CT Chest  IMPRESSION: 1.  No acute process or evidence of metastatic disease in the chest. 2. Locally advanced pancreatic carcinoma, as detailed previously. Cannot exclude superimposed pancreatitis. 3. New and enlarging liver lesions, suspicious for hepatic metastasis. 4. Aortic atherosclerosis (ICD10-I70.0), coronary artery atherosclerosis and emphysema (ICD10-J43.9).   11/28/2018 -  Chemotherapy   First line FOLFIRINOX q2weeks starting 11/28/18. Reduced dose of Oxaliplatin starting with cycle 9 due to mild thrombocytopenia and tolerance issue. Due to neuropathy and thrombocytopenia Oxaliplatin was d/c and we downgraded to FOLFIRI starting with C12 on 05/21/19.    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  The Common Hereditary Gene Panel offered by [facility] includes sequencing and/or deletion duplication testing of the following 48 genes: APC, ATM, AXIN2, BARD1, BMPR1A, BRCA1, BRCA2, BRIP1, CDH1, CDK4, CDKN2A (p14ARF), CDKN2A (p16INK4a), CHEK2, CTNNA1, DICER1, EPCAM (Deletion/duplication testing only), GREM1 (promoter region deletion/duplication testing only), KIT, MEN1, MLH1, MSH2, MSH3, MSH6,  MUTYH, NBN, NF1, NHTL1, PALB2, PDGFRA, PMS2, POLD1, POLE, PTEN, RAD50,  RAD51C, RAD51D, RNF43, SDHB, SDHC, SDHD, SMAD4, SMARCA4. STK11, TP53, TSC1, TSC2, and VHL.  The following genes were evaluated for sequence changes only: SDHA and HOXB13 c.251G>A variant only. The report date is December 04, 2018.,   02/11/2019 Imaging   CT AP W Contrast  IMPRESSION: 1. Stable appearing ill-defined pancreatic mass in the tail region extending into the splenic hilum. 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 2. The left hepatic lobe metastatic lesion is slightly larger and the right hepatic lobe lesion is slightly smaller. No new lesions are identified. 3. Small peripancreatic lymph nodes are stable. No retroperitoneal lymphadenopathy. 4. No worrisome pulmonary nodules at the lung bases and no worrisome bone lesions.   05/19/2019 Imaging   CT AP W Contrast  IMPRESSION: 4.5 cm pancreatic tail mass extending to the splenic hilum, mildly improved.   Improving hepatic metastases.   Stable abnormal soft tissue along the celiac axis, likely reflecting tumor.       CURRENT THERAPY: First lineFOLFIRINOX q2weeks starting4/23/20. Reduced dose ofOxaliplatinstarting with cycle 9 due to mild thrombocytopenia andtolerance issue.Due to neuropathy and thrombocytopenia Oxaliplatin was d/c and wechange itto FOLFIRI starting with C12 on 10/14/20every 3-4 weeks due to [facility] and slow recovery of thrombocytopenia.  INTERVAL HISTORY: Vincent [name] returns for f/u and treatment as scheduled. He completed last cycle FOLFIRI on 07/09/19. He feels great, has no issues with treatment. Denies abdominal pain, n/v/c/d. He is sore after working in the yard and on his car. He remains very active. Appetite is good. Neuropathy is stable, takes 300 mg gabapentin at night. He is able to function well. No recent bleeding. He was told to take baby aspirin for retinal hemorrhage but he  has not started. promacta has not been approved yet, he needs to call the pharmacy. Denies recent fever, chills, cough, chest pain, dyspnea, leg swelling or pain.    MEDICAL HISTORY:  Past Medical History:  Diagnosis Date   Diabetes mellitus without complication ([place])    pt. states that he is not diabeticalthought he takes metformin   Eye hemorrhage    right   Family history of lung cancer    Hypertension    Monoallelic mutation of BRIP1 gene    Monoallelic mutation of MUTYH gene     SURGICAL HISTORY: Past Surgical History:  Procedure Laterality Date   ESOPHAGOGASTRODUODENOSCOPY (EGD) WITH PROPOFOL N/A 10/29/2018   Procedure: ESOPHAGOGASTRODUODENOSCOPY (EGD) WITH PROPOFOL;  Surgeon: Arta Silence, MD;  Location: [facility];  Service: Endoscopy;  Laterality: N/A;   EUS N/A 10/29/2018   Procedure: FULL UPPER ENDOSCOPIC ULTRASOUND (EUS) RADIAL;  Surgeon: Arta Silence, MD;  Location: [facility];  Service: Endoscopy;  Laterality: N/A;   FINE NEEDLE ASPIRATION N/A 10/29/2018   Procedure: FINE NEEDLE ASPIRATION (FNA) LINEAR;  Surgeon: Arta Silence, MD;  Location: [facility];  Service: Endoscopy;  Laterality: N/A;   IR IMAGING GUIDED PORT INSERTION  11/11/2018   LUMBAR LAMINECTOMY/DECOMPRESSION MICRODISCECTOMY Left 10/11/2017   Procedure: Microlumbar decompression Lumbar Four-Five Left;  Surgeon: Susa Day, MD;  Location: [facility];  Service: Orthopedics;  Laterality: Left;  Microlumbar decompression Lumbar Four-Five Left   removal of wisdom teeth  1977    I have reviewed the social history and family history with the patient and they are unchanged from previous note.  ALLERGIES:  has No Known Allergies.  MEDICATIONS:  Current Outpatient Medications  Medication Sig Dispense Refill   Accu-Chek FastClix Lancets MISC daily. for testing  aspirin EC 81 MG tablet Take 1 tablet (81 mg total) by mouth daily after breakfast. Resume 4 days post-op     cephALEXin  (KEFLEX) 500 MG capsule Take 1 capsule (500 mg total) by mouth 3 (three) times daily. 21 capsule 0   eltrombopag (PROMACTA) 25 MG tablet Take 1 tablet (25 mg total) by mouth daily. Take on an empty stomach, 1 hour before a meal or 2 hours after. 30 tablet 0   gabapentin (NEURONTIN) 100 MG capsule Take 3 capsules (300 mg total) by mouth at bedtime. May gradually increase to 300 mg at night 90 capsule 3   Ginger, Zingiber officinalis, (GINGER ROOT PO) Take 750 mg by mouth.     KLOR-CON M20 20 MEQ tablet TAKE 1 TABLET (20 MEQ TOTAL) BY MOUTH EVERY OTHER DAY. 30 tablet 0   lidocaine-prilocaine (EMLA) cream Apply to affected area once 30 g 3   lisinopril (PRINIVIL,ZESTRIL) 10 MG tablet Take 10 mg by mouth daily after breakfast.     metFORMIN (GLUCOPHAGE) 1000 MG tablet TAKE 1 TABLET BY MOUTH AT BREAKFAST AND 1/2 TABLET AT SUPPER     ondansetron (ZOFRAN) 8 MG tablet Take 1 tablet (8 mg total) by mouth 2 (two) times daily as needed for refractory nausea / vomiting. Start on day 3 after chemotherapy. 30 tablet 1   prochlorperazine (COMPAZINE) 10 MG tablet Take 1 tablet (10 mg total) by mouth every 6 (six) hours as needed (NAUSEA). 30 tablet 1   simvastatin (ZOCOR) 40 MG tablet Take 20 mg by mouth every evening.     traMADol (ULTRAM) 50 MG tablet Take 2 tablets (100 mg total) by mouth every 6 (six) hours as needed. 60 tablet 0   No current facility-administered medications for this visit.   Facility-Administered Medications Ordered in Other Visits  Medication Dose Route Frequency Provider Last Rate Last Admin   atropine injection 0.4 mg  0.4 mg Intravenous Once PRN Truitt Merle, MD       fluorouracil (ADRUCIL) 5,000 mg in sodium chloride 0.9 % 150 mL chemo infusion  2,212 mg/m2 (Treatment Plan Recorded) Intravenous 1 day or 1 dose Truitt Merle, MD       irinotecan (CAMPTOSAR) 300 mg in sodium chloride 0.9 % 500 mL chemo infusion  130 mg/m2 (Treatment Plan Recorded) Intravenous Once Truitt Merle, MD        leucovorin 904 mg in sodium chloride 0.9 % 250 mL infusion  400 mg/m2 (Treatment Plan Recorded) Intravenous Once Truitt Merle, MD       sodium chloride flush (NS) 0.9 % injection 10 mL  10 mL Intracatheter PRN Truitt Merle, MD        PHYSICAL EXAMINATION: ECOG PERFORMANCE STATUS: 0 - Asymptomatic  Vitals:   07/23/19 0830  BP: (!) 127/91  Pulse: 72  Resp: 18  Temp: 98.5 F (36.9 C)  SpO2: 100%   GENERAL:alert, no distress and comfortable SKIN: no rash  EYES:  sclera clear LUNGS: clear with normal breathing effort HEART: regular rate & rhythm, no lower extremity edema ABDOMEN:abdomen soft, non-tender and normal bowel sounds NEURO: alert & oriented x 3 with fluent speech, no focal motor/sensory deficits per tuning fork exam  PAC without erythema   LABORATORY DATA:  I have reviewed the data as listed CBC Latest Ref Rng & Units 07/23/2019 07/09/2019 06/18/2019  WBC 4.0 - 10.5 K/uL 5.9 4.0 3.3(L)  Hemoglobin 13.0 - 17.0 g/dL 11.3(L) 11.8(L) 11.2(L)  Hematocrit 39.0 - 52.0 % 34.4(L) 36.5(L) 34.5(L)  Platelets 150 -  400 K/uL 106(L) 143(L) 102(L)     CMP Latest Ref Rng & Units 07/23/2019 07/09/2019 06/18/2019  Glucose 70 - 99 mg/dL 144(H) 160(H) 153(H)  BUN 8 - 23 mg/dL '15 16 18  ' Creatinine 0.61 - 1.24 mg/dL 0.84 0.83 0.85  Sodium 135 - 145 mmol/L 141 141 139  Potassium 3.5 - 5.1 mmol/L 4.3 4.5 4.2  Chloride 98 - 111 mmol/L 106 106 108  CO2 22 - 32 mmol/L '25 26 23  ' Calcium 8.9 - 10.3 mg/dL 8.6(L) 8.7(L) 8.5(L)  Total Protein 6.5 - 8.1 g/dL 6.5 6.8 6.6  Total Bilirubin 0.3 - 1.2 mg/dL 0.4 0.3 0.5  Alkaline Phos 38 - 126 U/L 116 129(H) 113  AST 15 - 41 U/L 14(L) 16 14(L)  ALT 0 - 44 U/L '16 19 18      ' RADIOGRAPHIC STUDIES: I have personally reviewed the radiological images as listed and agreed with the findings in the report. No results found.   ASSESSMENT & PLAN: Vincent Nicholl Caseyis a 68 y.o.malewith   1. Adenocarcinoma oftail of Pancreas, cT3N0Mx, with liver  metastasis -diagnosedin3/2020. CT APand abdominal MRI showed a 1.6cm lesion in thedorm, althoughit appears likely a benign cyst on the CT scan, however the MRI features are concerning for livermetastasis.due to [place], liver biopsy was not done -He started first-line FOLFIRINOX every 2 weeks on 11/28/18. Heistolerating welloverallwith fatigue andfluctuatingappetite and cold sensitivity, intermittent neuropathy s/p C6 and mild diarrhea.  -He has had 2 episodes of dizziness and syncope upon standing with cycle 4.He previously receivedIV fluids to pump d/c -CT AP from7/7/20 showsoverall mixed response/stable disease; treatment was continued  -Oxaliplatin was dose reduced with cycle 9 due to mild neuropathy and thrombocytopenia; neuropathy improved with addition of gabapentin on 04/23/19. Oxaliplatin ultimately d/c'd with cycle 12 due to neuropathy and thrombocytopenia -He tolerates FOLFIRI much better  -Vincent [name] appears well today. He completed cycle 14 chemo. He tolerates FOLFIRI very well. PS remains very good. Denies pain.  -labs reviewed. CBC and CMP adequate for treatment. CA 19-9 has increased.  -He will proceed with cycle 15 FOLFIRI today, no further dose adjustments today.  -He will return for lab and restaging CT CAP on 1/4, with f/u and treatment on 1/6.   2. Epigastricand LUQpainradiating to his back -secondary topancreaticcancer -resolved on chemotherapy, he does not take pain medication  3. Lower appetite, Weight loss -f/u with dietician -has been able to gain weight on chemo  4. DM, HTN  -On Metformin, has not takenLisinoprilin over a month -BG 144 today, stable   5. Hypokalemia  -continue 20 mEq supplement daily -K 4.3, continue supplement   6. 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    7. 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  -remains able to function well. Tuning fork exam is normal today.   8. Thrombocytopenia -secondary to chemotherapy. Oxalplatin d/c'd with cycle 12 -insurance approval for promacta is pending. He will call the pharmacy today to f/u and let us know what we need to do.  -PLT 106K today, denies bleeding. Will monitor closely   9. Retinal hemorrhage -followed by Dr. Zigmund Daniel -He has been instructed to start baby aspirin 81 mg, but Vincent [name] is reluctant due to bleeding risk -I reviewed he can take ASA 81 mg MWF and monitor closely for bleeding -f/u with Dr. Zigmund Daniel on 08/18/19  10. Goals of care -treatment goal is palliative -Full code     PLAN: -Labs reviewed -Proceed with next cycle FOLFIRI today -Patient to call pharmacy re: Promacta -Refilled gabapentin  -Can start baby aspirin 81 mg 2-3 times per week, f/u with Dr. Zigmund Daniel in 08/2019 -Restaging CT CAP 1/4 -F/u and treatment 1/6  All questions were answered. The patient knows to call the clinic with any problems, questions or concerns. No barriers to learning was detected. I spent 20 minutes counseling the patient face to face. The total time spent in the appointment was 25 minutes and more than 50% was on counseling and review of test results     Alla Feeling, NP 07/23/19

## 2019-07-23 NOTE — Patient Instructions (Signed)
[facility] Discharge Instructions for Patients Receiving Chemotherapy  Today you received the following chemotherapy agents: Irinotecan, Leucovorin, 5FU pump   To help prevent nausea and vomiting after your treatment, we encourage you to take your nausea medication as directed.    If you develop nausea and vomiting that is not controlled by your nausea medication, call the clinic.   BELOW ARE SYMPTOMS THAT SHOULD BE REPORTED IMMEDIATELY:  *FEVER GREATER THAN 100.5 F  *CHILLS WITH OR WITHOUT FEVER  NAUSEA AND VOMITING THAT IS NOT CONTROLLED WITH YOUR NAUSEA MEDICATION  *UNUSUAL SHORTNESS OF BREATH  *UNUSUAL BRUISING OR BLEEDING  TENDERNESS IN MOUTH AND THROAT WITH OR WITHOUT PRESENCE OF ULCERS  *URINARY PROBLEMS  *BOWEL PROBLEMS  UNUSUAL RASH Items with * indicate a potential emergency and should be followed up as soon as possible.  Feel free to call the clinic should you have any questions or concerns. The clinic phone number is [phone].  Please show the CHEMO ALERT CARD at check-in to the Emergency Department and triage nurse.   

## 2019-07-23 NOTE — Telephone Encounter (Signed)
Scheduled appt per 12/16 los.

## 2019-07-24 ENCOUNTER — Telehealth: Payer: Self-pay

## 2019-07-24 MED FILL — PROMACTA 25 MG TABLET: 25 | 30 days supply | Qty: 30 | Fill #0

## 2019-07-24 NOTE — Telephone Encounter (Signed)
Oral Oncology Patient Advocate Encounter  Patient gave approval for me to complete online application for [employer] Patient [place] (NPAF) in an effort to reduce the patient's out of pocket expense for Promacta to $0.    Application completed and faxed to [phone].   NPAF phone number for follow up is [phone].   This encounter will be updated until final determination.   [place] Patient [place] Phone [phone] Fax [phone] 07/24/2019 3:14 PM

## 2019-07-24 NOTE — Telephone Encounter (Addendum)
Was able to obtain a trial voucher for Promacta. Copay is $0. Patient will pick up at [facility] on Friday 07/25/19.  BIN# [id number] PCN# [id number] GROUP# [id number] ID# [id number]  [place] Patient [place] Phone [phone] Fax [phone] 07/24/2019 9:32 AM

## 2019-07-24 NOTE — Telephone Encounter (Signed)
Oral Chemotherapy Pharmacist Encounter  Vincent [name] will pick up his Vincent [name] from [name] on Friday 07/25/2019. We were ale to get him a month month free voucher to get him started on Promacta while we await patient assistance.  Patient Education I spoke with patient for overview of new oral chemotherapy medication: Promacta (eltrombopag) for the treatment of thrombocytopenia, planned duration until no longer responding or unacceptable drug toxicity.   Counseled patient on administration, dosing, side effects, monitoring, drug-food interactions, safe handling, storage, and disposal. Patient will take 1 tablet (25 mg total) by mouth daily. Take on an empty stomach, 1 hour before a meal or 2 hours after.  Side effects include but not limited to: N/V.    Reviewed with patient importance of keeping a medication schedule and plan for any missed doses.  Vincent [name] voiced understanding and appreciation. All questions answered. Medication handout placed in the mail.  Provided patient with Oral [place] [facility] phone number. Patient knows to call the office with questions or concerns. Oral Chemotherapy Navigation Clinic will continue to follow.  Vincent [name], PharmD, BCPS, [facility] Hematology/Oncology Clinical Pharmacist [facility]/AP Oral [place] [facility] [phone]  07/24/2019 9:49 AM

## 2019-07-25 ENCOUNTER — Inpatient Hospital Stay: Payer: Medicare Other

## 2019-07-25 ENCOUNTER — Other Ambulatory Visit: Payer: Self-pay

## 2019-07-25 VITALS — BP 130/81 | HR 66 | Temp 98.1°F | Resp 18

## 2019-07-25 DIAGNOSIS — Z5111 Encounter for antineoplastic chemotherapy: Secondary | ICD-10-CM | POA: Diagnosis not present

## 2019-07-25 DIAGNOSIS — C252 Malignant neoplasm of tail of pancreas: Secondary | ICD-10-CM

## 2019-07-25 DIAGNOSIS — Z7189 Other specified counseling: Secondary | ICD-10-CM

## 2019-07-25 MED ORDER — SODIUM CHLORIDE 0.9% FLUSH
10.0000 mL | INTRAVENOUS | Status: DC | PRN
  Administered 2019-07-25: 11:00:00 10 mL
  Filled 2019-07-25: qty 10

## 2019-07-25 MED ORDER — PEGFILGRASTIM-CBQV 6 MG/0.6ML ~~LOC~~ SOSY
6.0000 mg | PREFILLED_SYRINGE | Freq: Once | SUBCUTANEOUS | Status: AC
  Administered 2019-07-25: 6 mg via SUBCUTANEOUS

## 2019-07-25 MED ORDER — DENOSUMAB 120 MG/1.7ML ~~LOC~~ SOLN
SUBCUTANEOUS | Status: AC
  Filled 2019-07-25: qty 1.7

## 2019-07-25 MED ORDER — HEPARIN SOD (PORK) LOCK FLUSH 100 UNIT/ML IV SOLN
500.0000 [IU] | Freq: Once | INTRAVENOUS | Status: AC | PRN
  Administered 2019-07-25: 500 [IU]
  Filled 2019-07-25: qty 5

## 2019-07-28 NOTE — Telephone Encounter (Addendum)
Oral Oncology Patient Advocate Encounter  Patient has been approved for [employer] PAF for Promacta until 08/06/20.  NPAF phone number for follow up is [phone].   Patient ID [place] Patient [place] Phone [phone] Fax [phone] 07/28/2019 10:45 AM

## 2019-08-04 ENCOUNTER — Other Ambulatory Visit: Payer: Self-pay

## 2019-08-04 ENCOUNTER — Other Ambulatory Visit: Payer: Self-pay | Admitting: Emergency Medicine

## 2019-08-04 ENCOUNTER — Telehealth: Payer: Self-pay | Admitting: *Deleted

## 2019-08-04 ENCOUNTER — Inpatient Hospital Stay (HOSPITAL_BASED_OUTPATIENT_CLINIC_OR_DEPARTMENT_OTHER): Payer: Medicare Other | Admitting: Medical

## 2019-08-04 ENCOUNTER — Telehealth: Payer: Self-pay

## 2019-08-04 VITALS — BP 100/60 | HR 88 | Temp 98.2°F | Resp 17 | Ht 76.0 in | Wt 219.7 lb

## 2019-08-04 DIAGNOSIS — R05 Cough: Secondary | ICD-10-CM | POA: Diagnosis not present

## 2019-08-04 DIAGNOSIS — U071 COVID-19: Secondary | ICD-10-CM | POA: Diagnosis not present

## 2019-08-04 DIAGNOSIS — C252 Malignant neoplasm of tail of pancreas: Secondary | ICD-10-CM

## 2019-08-04 DIAGNOSIS — C250 Malignant neoplasm of head of pancreas: Secondary | ICD-10-CM

## 2019-08-04 DIAGNOSIS — Z95828 Presence of other vascular implants and grafts: Secondary | ICD-10-CM | POA: Diagnosis not present

## 2019-08-04 DIAGNOSIS — I951 Orthostatic hypotension: Secondary | ICD-10-CM

## 2019-08-04 LAB — CMP (CANCER CENTER ONLY)
ALT: 24 U/L (ref 0–44)
AST: 23 U/L (ref 15–41)
Albumin: 3.8 g/dL (ref 3.5–5.0)
Alkaline Phosphatase: 132 U/L — ABNORMAL HIGH (ref 38–126)
Anion gap: 10 (ref 5–15)
BUN: 20 mg/dL (ref 8–23)
CO2: 24 mmol/L (ref 22–32)
Calcium: 8.6 mg/dL — ABNORMAL LOW (ref 8.9–10.3)
Chloride: 99 mmol/L (ref 98–111)
Creatinine: 1.5 mg/dL — ABNORMAL HIGH (ref 0.61–1.24)
GFR, Est AFR Am: 55 mL/min — ABNORMAL LOW (ref >60)
GFR, Estimated: 48 mL/min — ABNORMAL LOW (ref >60)
Glucose, Bld: 179 mg/dL — ABNORMAL HIGH (ref 70–99)
Potassium: 4.1 mmol/L (ref 3.5–5.1)
Sodium: 133 mmol/L — ABNORMAL LOW (ref 135–145)
Total Bilirubin: 1.1 mg/dL (ref 0.3–1.2)
Total Protein: 7.3 g/dL (ref 6.5–8.1)

## 2019-08-04 LAB — CBC WITH DIFFERENTIAL (CANCER CENTER ONLY)
Abs Immature Granulocytes: 0.09 10*3/uL — ABNORMAL HIGH (ref 0.00–0.07)
Basophils Absolute: 0.1 10*3/uL (ref 0.0–0.1)
Basophils Relative: 1 %
Eosinophils Absolute: 0.1 10*3/uL (ref 0.0–0.5)
Eosinophils Relative: 1 %
HCT: 39.3 % (ref 39.0–52.0)
Hemoglobin: 12.7 g/dL — ABNORMAL LOW (ref 13.0–17.0)
Immature Granulocytes: 1 %
Lymphocytes Relative: 5 %
Lymphs Abs: 0.8 10*3/uL (ref 0.7–4.0)
MCH: 33.5 pg (ref 26.0–34.0)
MCHC: 32.3 g/dL (ref 30.0–36.0)
MCV: 103.7 fL — ABNORMAL HIGH (ref 80.0–100.0)
Monocytes Absolute: 0.9 10*3/uL (ref 0.1–1.0)
Monocytes Relative: 6 %
Neutro Abs: 12.7 10*3/uL — ABNORMAL HIGH (ref 1.7–7.7)
Neutrophils Relative %: 86 %
Platelet Count: 122 10*3/uL — ABNORMAL LOW (ref 150–400)
RBC: 3.79 MIL/uL — ABNORMAL LOW (ref 4.22–5.81)
RDW: 13.4 % (ref 11.5–15.5)
WBC Count: 14.7 10*3/uL — ABNORMAL HIGH (ref 4.0–10.5)
nRBC: 0 % (ref 0.0–0.2)

## 2019-08-04 LAB — URINALYSIS, COMPLETE (UACMP) WITH MICROSCOPIC
Bilirubin Urine: NEGATIVE
Glucose, UA: NEGATIVE mg/dL
Hgb urine dipstick: NEGATIVE
Ketones, ur: 5 mg/dL — AB
Leukocytes,Ua: NEGATIVE
Nitrite: NEGATIVE
Protein, ur: 30 mg/dL — AB
Specific Gravity, Urine: 1.021 (ref 1.005–1.030)
pH: 5 (ref 5.0–8.0)

## 2019-08-04 MED ORDER — SODIUM CHLORIDE 0.9% FLUSH
10.0000 mL | Freq: Once | INTRAVENOUS | Status: AC
  Administered 2019-08-04: 14:00:00 10 mL
  Filled 2019-08-04: qty 10

## 2019-08-04 MED ORDER — SODIUM CHLORIDE 0.9 % IV SOLN
Freq: Once | INTRAVENOUS | Status: AC
  Filled 2019-08-04: qty 250

## 2019-08-04 MED ORDER — HEPARIN SOD (PORK) LOCK FLUSH 100 UNIT/ML IV SOLN
500.0000 [IU] | Freq: Once | INTRAVENOUS | Status: AC
  Administered 2019-08-04: 500 [IU]
  Filled 2019-08-04: qty 5

## 2019-08-04 NOTE — Telephone Encounter (Signed)
Received vm call from pt's wife stating that she had trouble getting through to main phone # & message left on Education line.  She states that she would like a return call.   Returned call today & pt & wife report that pt is cold all the time, fatigued, & has a decreased appetite.  He has also had a cough which is better than it was.  She states they have a gun thermometer & not sure is correct b/c they get different readings.  She does report temp 101 once.  Pt denies coughing anything up & any other symptoms. They are wondering if these are side effects of the promacta.  Informed wife that message would be sent to  Dr Burr Medico.

## 2019-08-04 NOTE — Patient Instructions (Signed)

## 2019-08-04 NOTE — Telephone Encounter (Signed)
Spoke with patient per Dr. Burr Medico explained that his symptoms are unlikely coming from Vincent [name].  Informed him we would like him to come in to be seen by Vincent Mealy PA in [facility] to be here at 11:00 today.  He verbalized an understanding.

## 2019-08-04 NOTE — Progress Notes (Signed)
Pt received 1.5 L IVF NS today per PA Van, tolerated well.  VSS.  Reports feeling better w/resolution of dizziness at end of tx.  Denies questions/concerns at time of d/c, verbalized understanding to f/u as needed and push oral fluids at home.

## 2019-08-05 ENCOUNTER — Other Ambulatory Visit: Payer: BC Managed Care – PPO

## 2019-08-05 ENCOUNTER — Ambulatory Visit: Payer: BC Managed Care – PPO | Admitting: Medical

## 2019-08-05 ENCOUNTER — Ambulatory Visit: Payer: BC Managed Care – PPO

## 2019-08-05 ENCOUNTER — Other Ambulatory Visit: Payer: Self-pay | Admitting: Hematology

## 2019-08-05 LAB — URINE CULTURE: Culture: NO GROWTH

## 2019-08-05 LAB — CANCER ANTIGEN 19-9: CA 19-9: 26033 U/mL — ABNORMAL HIGH (ref 0–35)

## 2019-08-05 NOTE — Progress Notes (Signed)
[facility]pt[facility] Progress Note   [name] [id number] 10/02/1950 68 y.o.  [name] is managed by   Actively treated with chemotherapy/immunotherapy/hormonal therapy: yes  Current therapy: FOLFIRI with Udenyca support  Last treated: 07/23/2019 (cycle 15, day 1)  Next scheduled appointment with provider: 08/13/2019  Assessment: Plan:    Orthostatic hypotension - Plan: 0.9 %  sodium chloride infusion  Port-A-Cath in place - Plan: heparin lock flush 100 unit/mL, sodium chloride flush (NS) 0.9 % injection 10 mL  Malignant neoplasm of head of pancreas ([place]) - Plan: heparin lock flush 100 unit/mL, sodium chloride flush (NS) 0.9 % injection 10 mL   Orthostatic hypotension: The patient was given 1.5 liters of normal saline IV today.  Metastatic pancreatic cancer: Mr. Vincent [name] continues to be followed by Dr. Truitt Merle and is status post cycle 15, day 1 of FOLFIRI with Udenyca support.  His last cycle was started on 07/23/2019.  He is scheduled to be seen in follow-up on 08/13/2019.  Please see After Visit Summary for patient specific instructions.  Future Appointments  Date Time Provider [place]  08/11/2019  8:00 AM [facility] LAB ONLY [facility] None  08/11/2019  8:15 AM [facility] [place] FLUSH [facility] None  08/11/2019  9:00 AM [facility] 2 [facility] [place]  08/13/2019 10:20 AM Truitt Merle, MD [facility] None  08/13/2019 11:00 AM [facility] INFUSION [facility] None  08/15/2019 10:30 AM [facility] [place] FLUSH [facility] None  08/18/2019  7:30 AM Hayden Pedro, MD T[facility]-TRE None  08/27/2019  8:00 AM [facility] LAB 1 [facility] None  08/27/2019  8:15 AM [facility] [place] FLUSH [facility] None  08/27/2019  8:40 AM Truitt Merle, MD [facility] None  08/27/2019  9:30 AM [facility] INFUSION [facility] None  08/29/2019 11:00 AM [facility] [place] FLUSH [facility] None    No orders of the defined types were placed in this encounter.      Subjective:   Patient ID:  Vincent [name] is a 68 y.o. (DOB 1951/03/08) male.  Chief Complaint:  Chief Complaint  Patient presents with  . Fatigue    HPI Vincent [name]  Is a 68 y.o. male with a diagnosis of a metastatic pancreatic cancer. He is managed by Dr. Truitt Merle and is status post cycle 15, day 1 of FOLFIRI with Udenyca support.  His last cycle was started on 07/23/2019.  He presents to the clinic today with a report of fatigue and overall weakness.  He states that his symptoms started approximately 2 weeks before Christmas.  He has had chills.  He took his temperature with a laser thermometer with his forehead temperature measuring 101.  He repeated this with an oral thermometer with his temperature returning normal.  He had an episode of coughing after he drank a glass of red wine.  He denies fevers, sweats, nausea, vomiting, constipation, or diarrhea.  Medications: I have reviewed the patient's current medications.  Allergies: No Known Allergies  Past Medical History:  Diagnosis Date  . Diabetes mellitus without complication ([place])    pt. states that he is not diabeticalthought he takes metformin  . Eye hemorrhage    right  . Family history of lung cancer   . Hypertension   . Monoallelic mutation of BRIP1 gene   . Monoallelic mutation of MUTYH gene     Past Surgical History:  Procedure Laterality Date  . ESOPHAGOGASTRODUODENOSCOPY (EGD) WITH PROPOFOL N/A 10/29/2018   Procedure: ESOPHAGOGASTRODUODENOSCOPY (EGD) WITH PROPOFOL;  Surgeon: Arta Silence, MD;  Location: [facility];  Service: Endoscopy;  Laterality: N/A;  . EUS N/A 10/29/2018   Procedure: FULL UPPER ENDOSCOPIC ULTRASOUND (EUS) RADIAL;  Surgeon: Arta Silence, MD;  [facility];  Service: Endoscopy;  Laterality: N/A;  . FINE NEEDLE ASPIRATION N/A 10/29/2018   Procedure: FINE NEEDLE ASPIRATION (FNA) LINEAR;  Surgeon: Arta Silence, MD;  [facility];  Service: Endoscopy;  Laterality: N/A;  . IR IMAGING GUIDED  PORT INSERTION  11/11/2018  . LUMBAR LAMINECTOMY/DECOMPRESSION MICRODISCECTOMY Left 10/11/2017   Procedure: Microlumbar decompression Lumbar Four-Five Left;  Surgeon: Susa Day, MD;  Location: [facility];  Service: Orthopedics;  Laterality: Left;  Microlumbar decompression Lumbar Four-Five Left  . removal of wisdom teeth  1977    Family History  Problem Relation Age of Onset  . Cancer Mother 88       precancerous cells, thinks possibly ovarian     Social History   Socioeconomic History  . Marital status: Married    Spouse name: Not on file  . Number of children: 4  . Years of education: Not on file  . Highest education level: Not on file  Occupational History  . Not on file  Tobacco Use  . Smoking status: Never Smoker  . Smokeless tobacco: Never Used  Substance and Sexual Activity  . Alcohol use: Yes    Comment: rarely  . Drug use: No  . Sexual activity: Not on file  Other Topics Concern  . Not on file  Social History Narrative  . Not on file   Social Determinants of Health   Financial Resource Strain:   . Difficulty of Paying Living Expenses: Not on file  Food Insecurity:   . Worried About Charity fundraiser in the Last Year: Not on file  . Ran Out of Food in the Last Year: Not on file  Transportation Needs:   . Lack of Transportation (Medical): Not on file  . Lack of Transportation (Non-Medical): Not on file  Physical Activity:   . Days of Exercise per Week: Not on file  . Minutes of Exercise per Session: Not on file  Stress:   . Feeling of Stress : Not on file  Social Connections:   . Frequency of Communication with Friends and Family: Not on file  . Frequency of Social Gatherings with Friends and Family: Not on file  . Attends Religious Services: Not on file  . Active Member of Clubs or Organizations: Not on file  . Attends Archivist Meetings: Not on file  . Marital Status: Not on file  Intimate Partner Violence:   . Fear of Current or Ex-Partner:  Not on file  . Emotionally Abused: Not on file  . Physically Abused: Not on file  . Sexually Abused: Not on file    Past Medical History, Surgical history, Social history, and Family history were reviewed and updated as appropriate.   Please see review of systems for further details on the patient's review from today.   Review of Systems:  Review of Systems  Constitutional: Positive for chills and fatigue. Negative for appetite change, diaphoresis and fever.  HENT: Negative for dental problem, mouth sores and trouble swallowing.   Respiratory: Negative for cough, chest tightness and shortness of breath.   Cardiovascular: Negative for chest pain and palpitations.  Gastrointestinal: Negative for constipation, diarrhea, nausea and vomiting.  Neurological: Positive for weakness. Negative for dizziness, syncope and headaches.    Objective:   Physical Exam:  BP 100/60   Pulse 88  Temp 98.2 F (36.8 C) (Temporal)   Resp 17   Ht '6\' 4"'  (1.93 m)   Wt 219 lb 11.2 oz (99.7 kg)   SpO2 95%   BMI 26.74 kg/m  ECOG: 0  Orthostatic Blood Pressure: Blood pressure:   sitting 72/49 , standing 45/35 Pulse:   sitting 94, standing 102  Physical Exam Constitutional:      General: He is not in acute distress.    Appearance: He is not diaphoretic.  HENT:     Head: Normocephalic and atraumatic.  Eyes:     General: No scleral icterus.       Right eye: No discharge.        Left eye: No discharge.  Cardiovascular:     Rate and Rhythm: Normal rate and regular rhythm.     Heart sounds: Normal heart sounds. No murmur. No friction rub. No gallop.   Pulmonary:     Effort: Pulmonary effort is normal. No respiratory distress.     Breath sounds: Normal breath sounds. No wheezing or rales.  Abdominal:     General: Abdomen is flat. Bowel sounds are normal. There is no distension.     Tenderness: There is no abdominal tenderness. There is no guarding.  Skin:    General: Skin is warm and dry.      Findings: No erythema or rash.  Neurological:     Mental Status: He is alert.     Gait: Gait normal.  Psychiatric:        Mood and Affect: Mood normal.        Behavior: Behavior normal.        Thought Content: Thought content normal.        Judgment: Judgment normal.     Lab Review:     Component Value Date/Time   NA 133 (L) 08/04/2019 1152   K 4.1 08/04/2019 1152   CL 99 08/04/2019 1152   CO2 24 08/04/2019 1152   GLUCOSE 179 (H) 08/04/2019 1152   BUN 20 08/04/2019 1152   CREATININE 1.50 (H) 08/04/2019 1152   CALCIUM 8.6 (L) 08/04/2019 1152   PROT 7.3 08/04/2019 1152   ALBUMIN 3.8 08/04/2019 1152   AST 23 08/04/2019 1152   ALT 24 08/04/2019 1152   ALKPHOS 132 (H) 08/04/2019 1152   BILITOT 1.1 08/04/2019 1152   GFRNONAA 48 (L) 08/04/2019 1152   GFRAA 55 (L) 08/04/2019 1152       Component Value Date/Time   WBC 14.7 (H) 08/04/2019 1152   WBC 8.1 11/11/2018 0954   RBC 3.79 (L) 08/04/2019 1152   HGB 12.7 (L) 08/04/2019 1152   HCT 39.3 08/04/2019 1152   PLT 122 (L) 08/04/2019 1152   MCV 103.7 (H) 08/04/2019 1152   MCH 33.5 08/04/2019 1152   MCHC 32.3 08/04/2019 1152   RDW 13.4 08/04/2019 1152   LYMPHSABS 0.8 08/04/2019 1152   MONOABS 0.9 08/04/2019 1152   EOSABS 0.1 08/04/2019 1152   BASOSABS 0.1 08/04/2019 1152   -------------------------------  Imaging from last 24 hours (if applicable):  Radiology interpretation: No results found.

## 2019-08-06 ENCOUNTER — Telehealth: Payer: Self-pay

## 2019-08-06 ENCOUNTER — Inpatient Hospital Stay (HOSPITAL_COMMUNITY)
Admission: EM | Admit: 2019-08-06 | Discharge: 2019-09-03 | DRG: 177 | Disposition: A | Discharge status: Hospice | Payer: Medicare Other | Source: Ambulatory Visit | Attending: Internal Medicine | Admitting: Internal Medicine

## 2019-08-06 ENCOUNTER — Other Ambulatory Visit: Payer: Self-pay

## 2019-08-06 ENCOUNTER — Telehealth: Payer: Self-pay | Admitting: Medical

## 2019-08-06 ENCOUNTER — Emergency Department (HOSPITAL_COMMUNITY): Payer: Medicare Other

## 2019-08-06 ENCOUNTER — Ambulatory Visit (INDEPENDENT_AMBULATORY_CARE_PROVIDER_SITE_OTHER): Payer: Medicare Other | Admitting: Internal Medicine

## 2019-08-06 ENCOUNTER — Encounter (HOSPITAL_COMMUNITY): Payer: Self-pay | Admitting: Emergency Medicine

## 2019-08-06 VITALS — BP 112/76 | HR 97 | Temp 96.8°F | Ht 76.0 in | Wt 219.8 lb

## 2019-08-06 DIAGNOSIS — U071 COVID-19: Principal | ICD-10-CM | POA: Diagnosis present

## 2019-08-06 DIAGNOSIS — Z66 Do not resuscitate: Secondary | ICD-10-CM | POA: Diagnosis present

## 2019-08-06 DIAGNOSIS — J9601 Acute respiratory failure with hypoxia: Secondary | ICD-10-CM | POA: Diagnosis present

## 2019-08-06 DIAGNOSIS — I10 Essential (primary) hypertension: Secondary | ICD-10-CM | POA: Diagnosis present

## 2019-08-06 DIAGNOSIS — E860 Dehydration: Secondary | ICD-10-CM | POA: Diagnosis present

## 2019-08-06 DIAGNOSIS — Z7984 Long term (current) use of oral hypoglycemic drugs: Secondary | ICD-10-CM

## 2019-08-06 DIAGNOSIS — D539 Nutritional anemia, unspecified: Secondary | ICD-10-CM | POA: Diagnosis present

## 2019-08-06 DIAGNOSIS — R0902 Hypoxemia: Secondary | ICD-10-CM

## 2019-08-06 DIAGNOSIS — E119 Type 2 diabetes mellitus without complications: Secondary | ICD-10-CM

## 2019-08-06 DIAGNOSIS — Z79899 Other long term (current) drug therapy: Secondary | ICD-10-CM

## 2019-08-06 DIAGNOSIS — J189 Pneumonia, unspecified organism: Secondary | ICD-10-CM

## 2019-08-06 DIAGNOSIS — R0602 Shortness of breath: Secondary | ICD-10-CM

## 2019-08-06 DIAGNOSIS — R5081 Fever presenting with conditions classified elsewhere: Secondary | ICD-10-CM | POA: Diagnosis not present

## 2019-08-06 DIAGNOSIS — C787 Secondary malignant neoplasm of liver and intrahepatic bile duct: Secondary | ICD-10-CM | POA: Diagnosis present

## 2019-08-06 DIAGNOSIS — R0781 Pleurodynia: Secondary | ICD-10-CM | POA: Diagnosis not present

## 2019-08-06 DIAGNOSIS — F419 Anxiety disorder, unspecified: Secondary | ICD-10-CM | POA: Diagnosis present

## 2019-08-06 DIAGNOSIS — R05 Cough: Secondary | ICD-10-CM

## 2019-08-06 DIAGNOSIS — E1169 Type 2 diabetes mellitus with other specified complication: Secondary | ICD-10-CM

## 2019-08-06 DIAGNOSIS — Z801 Family history of malignant neoplasm of trachea, bronchus and lung: Secondary | ICD-10-CM

## 2019-08-06 DIAGNOSIS — E118 Type 2 diabetes mellitus with unspecified complications: Secondary | ICD-10-CM | POA: Diagnosis present

## 2019-08-06 DIAGNOSIS — R059 Cough, unspecified: Secondary | ICD-10-CM

## 2019-08-06 DIAGNOSIS — C259 Malignant neoplasm of pancreas, unspecified: Secondary | ICD-10-CM | POA: Diagnosis present

## 2019-08-06 DIAGNOSIS — D849 Immunodeficiency, unspecified: Secondary | ICD-10-CM | POA: Diagnosis not present

## 2019-08-06 DIAGNOSIS — E785 Hyperlipidemia, unspecified: Secondary | ICD-10-CM | POA: Diagnosis present

## 2019-08-06 DIAGNOSIS — D630 Anemia in neoplastic disease: Secondary | ICD-10-CM | POA: Diagnosis present

## 2019-08-06 DIAGNOSIS — Z79891 Long term (current) use of opiate analgesic: Secondary | ICD-10-CM

## 2019-08-06 DIAGNOSIS — J1282 Pneumonia due to coronavirus disease 2019: Secondary | ICD-10-CM | POA: Diagnosis present

## 2019-08-06 DIAGNOSIS — Z7982 Long term (current) use of aspirin: Secondary | ICD-10-CM

## 2019-08-06 LAB — CBC WITH DIFFERENTIAL/PLATELET
Abs Immature Granulocytes: 0.06 10*3/uL (ref 0.00–0.07)
Basophils Absolute: 0 10*3/uL (ref 0.0–0.1)
Basophils Relative: 0 %
Eosinophils Absolute: 0 10*3/uL (ref 0.0–0.5)
Eosinophils Relative: 0 %
HCT: 38.1 % — ABNORMAL LOW (ref 39.0–52.0)
Hemoglobin: 12.5 g/dL — ABNORMAL LOW (ref 13.0–17.0)
Immature Granulocytes: 1 %
Lymphocytes Relative: 4 %
Lymphs Abs: 0.4 10*3/uL — ABNORMAL LOW (ref 0.7–4.0)
MCH: 34.1 pg — ABNORMAL HIGH (ref 26.0–34.0)
MCHC: 32.8 g/dL (ref 30.0–36.0)
MCV: 103.8 fL — ABNORMAL HIGH (ref 80.0–100.0)
Monocytes Absolute: 0.4 10*3/uL (ref 0.1–1.0)
Monocytes Relative: 5 %
Neutro Abs: 8.5 10*3/uL — ABNORMAL HIGH (ref 1.7–7.7)
Neutrophils Relative %: 90 %
Platelets: 153 10*3/uL (ref 150–400)
RBC: 3.67 MIL/uL — ABNORMAL LOW (ref 4.22–5.81)
RDW: 13.4 % (ref 11.5–15.5)
WBC: 9.4 10*3/uL (ref 4.0–10.5)
nRBC: 0 % (ref 0.0–0.2)

## 2019-08-06 LAB — COMPREHENSIVE METABOLIC PANEL
ALT: 38 U/L (ref 0–44)
AST: 38 U/L (ref 15–41)
Albumin: 3.5 g/dL (ref 3.5–5.0)
Alkaline Phosphatase: 81 U/L (ref 38–126)
Anion gap: 11 (ref 5–15)
BUN: 16 mg/dL (ref 8–23)
CO2: 24 mmol/L (ref 22–32)
Calcium: 8.5 mg/dL — ABNORMAL LOW (ref 8.9–10.3)
Chloride: 97 mmol/L — ABNORMAL LOW (ref 98–111)
Creatinine, Ser: 1.04 mg/dL (ref 0.61–1.24)
GFR calc Af Amer: >60 mL/min (ref >60)
GFR calc non Af Amer: >60 mL/min (ref >60)
Glucose, Bld: 149 mg/dL — ABNORMAL HIGH (ref 70–99)
Potassium: 3.9 mmol/L (ref 3.5–5.1)
Sodium: 132 mmol/L — ABNORMAL LOW (ref 135–145)
Total Bilirubin: 0.9 mg/dL (ref 0.3–1.2)
Total Protein: 6.9 g/dL (ref 6.5–8.1)

## 2019-08-06 LAB — LACTATE DEHYDROGENASE: LDH: 248 U/L — ABNORMAL HIGH (ref 98–192)

## 2019-08-06 LAB — POC SARS CORONAVIRUS 2 AG -  ED: SARS Coronavirus 2 Ag: POSITIVE — AB

## 2019-08-06 LAB — C-REACTIVE PROTEIN: CRP: 16.2 mg/dL — ABNORMAL HIGH (ref <1.0)

## 2019-08-06 LAB — PROCALCITONIN: Procalcitonin: 0.19 ng/mL

## 2019-08-06 LAB — D-DIMER, QUANTITATIVE: D-Dimer, Quant: 1.67 ug/mL-FEU — ABNORMAL HIGH (ref 0.00–0.50)

## 2019-08-06 LAB — TRIGLYCERIDES: Triglycerides: 77 mg/dL (ref <150)

## 2019-08-06 LAB — FERRITIN: Ferritin: 562 ng/mL — ABNORMAL HIGH (ref 24–336)

## 2019-08-06 LAB — TROPONIN I (HIGH SENSITIVITY): Troponin I (High Sensitivity): 6 ng/L (ref <18)

## 2019-08-06 LAB — LACTIC ACID, PLASMA: Lactic Acid, Venous: 1.6 mmol/L (ref 0.5–1.9)

## 2019-08-06 LAB — FIBRINOGEN: Fibrinogen: 589 mg/dL — ABNORMAL HIGH (ref 210–475)

## 2019-08-06 MED ORDER — DEXAMETHASONE SODIUM PHOSPHATE 10 MG/ML IJ SOLN
10.0000 mg | Freq: Once | INTRAMUSCULAR | Status: AC
  Administered 2019-08-06: 10 mg via INTRAVENOUS
  Filled 2019-08-06: qty 1

## 2019-08-06 NOTE — ED Notes (Signed)
Patient oxygen saturations dropped to 77% on 4L [place] Patient states he is not in any distress Oxygen moved up to 8L [place]

## 2019-08-06 NOTE — ED Triage Notes (Signed)
Per EMS, patient coming from pulmonologist, sent to pulmonology by oncology for Covid test. Patient denies SOB. 94% 3L [name]. C/o generalized weakness and cough x12 days. Hx pancreatic cancer. Last treatment 12/21.

## 2019-08-06 NOTE — Telephone Encounter (Signed)
Patient's wife calls stating that he is still coughing, dizzy, lethargic, feverish.  She would like advice on what to do.  [phone]

## 2019-08-06 NOTE — ED Provider Notes (Signed)
[facility] DEPT Provider Note   CSN: [id number] Arrival date & time: 08/06/19  1951     History Chief Complaint  Patient presents with  . Cough  . Weakness  . Fever    Vincent [name] is a 68 y.o. male.  67 y.o male with a PMH of DM, HTN, Pancreatic CA presents to the ED with a chief complaint of cough, change in taste x 2 weeks. Patient reports he has been hanging out with his wife approximately 2 weeks ago, he reports he felt a different taste in his mouth while drinking a glass of wine.  He reports he has had a dry cough, this is nonproductive however he feels like he is unable to bring up any sputum.  He does endorse some dry heaving.  Patient is currently receiving active chemo treatment for pancreatic cancer with his last dose being 07/14/2019.  Patient was seen at respiratory clinic today, was sent into the ED for further evaluation.  Patient denies any fever, has had some chills while at home.  He reports he feels overall well aside from the change in his taste.  He is currently followed by oncologist Dr. Burr Medico.  He denies any shortness of breath, chest pain, prior history of blood clots.  The history is provided by the patient.  Cough Associated symptoms: fever and myalgias   Associated symptoms: no chest pain, no headaches, no shortness of breath and no sore throat   Weakness Associated symptoms: cough, fever and myalgias   Associated symptoms: no abdominal pain, no chest pain, no headaches, no nausea, no shortness of breath and no vomiting   Fever Associated symptoms: cough and myalgias   Associated symptoms: no chest pain, no headaches, no nausea, no sore throat and no vomiting        Past Medical History:  Diagnosis Date  . Diabetes mellitus without complication ([place])    pt. states that he is not diabeticalthought he takes metformin  . Eye hemorrhage    right  . Family history of lung cancer   . Hypertension   . Monoallelic  mutation of BRIP1 gene   . Monoallelic mutation of MUTYH gene     Patient Active Problem List   Diagnosis Date Noted  . Family history of lung cancer   . Monoallelic mutation of BRIP1 gene   . Monoallelic mutation of MUTYH gene   . Port-A-Cath in place 12/26/2018  . Genetic testing 12/05/2018  . Goals of care, counseling/discussion 11/14/2018  . Pancreatic cancer ([place]) 11/04/2018  . HNP (herniated nucleus pulposus), lumbar 10/11/2017  . Spinal stenosis at L4-L5 level 10/11/2017    Past Surgical History:  Procedure Laterality Date  . ESOPHAGOGASTRODUODENOSCOPY (EGD) WITH PROPOFOL N/A 10/29/2018   Procedure: ESOPHAGOGASTRODUODENOSCOPY (EGD) WITH PROPOFOL;  Surgeon: Arta Silence, MD;  [facility];  Service: Endoscopy;  Laterality: N/A;  . EUS N/A 10/29/2018   Procedure: FULL UPPER ENDOSCOPIC ULTRASOUND (EUS) RADIAL;  Surgeon: Arta Silence, MD;  [facility];  Service: Endoscopy;  Laterality: N/A;  . FINE NEEDLE ASPIRATION N/A 10/29/2018   Procedure: FINE NEEDLE ASPIRATION (FNA) LINEAR;  Surgeon: Arta Silence, MD;  [facility];  Service: Endoscopy;  Laterality: N/A;  . IR IMAGING GUIDED PORT INSERTION  11/11/2018  . LUMBAR LAMINECTOMY/DECOMPRESSION MICRODISCECTOMY Left 10/11/2017   Procedure: Microlumbar decompression Lumbar Four-Five Left;  Surgeon: Susa Day, MD;  Location: [facility];  Service: Orthopedics;  Laterality: Left;  Microlumbar decompression Lumbar Four-Five Left  . removal  of wisdom teeth  1977       Family History  Problem Relation Age of Onset  . Cancer Mother 2       precancerous cells, thinks possibly ovarian     Social History   Tobacco Use  . Smoking status: Never Smoker  . Smokeless tobacco: Never Used  Substance Use Topics  . Alcohol use: Yes    Comment: rarely  . Drug use: No    Home Medications Prior to Admission medications   Medication Sig Start Date End Date Taking? Authorizing Provider  Accu-Chek FastClix  Lancets MISC daily. for testing 10/10/18  Yes [provider]  aspirin EC 81 MG tablet Take 1 tablet (81 mg total) by mouth daily after breakfast. Resume 4 days post-op 10/11/17  Yes Lacie Draft M, PA-C  eltrombopag (PROMACTA) 25 MG tablet Take 1 tablet (25 mg total) by mouth daily. Take on an empty stomach, 1 hour before a meal or 2 hours after. 07/23/19  Yes Truitt Merle, MD  gabapentin (NEURONTIN) 100 MG capsule Take 3 capsules (300 mg total) by mouth at bedtime. May gradually increase to 300 mg at night 07/23/19  Yes Burton, Wilhemina Cash, NP  Ginger, Zingiber officinalis, (GINGER ROOT PO) Take 750 mg by mouth.   Yes [provider]  KLOR-CON M20 20 MEQ tablet TAKE 1 TABLET (20 MEQ TOTAL) BY MOUTH EVERY OTHER DAY. Patient taking differently: Take 20 mEq by mouth every other day.  08/05/19  Yes Truitt Merle, MD  lidocaine-prilocaine (EMLA) cream Apply to affected area once 11/14/18  Yes Truitt Merle, MD  lisinopril (PRINIVIL,ZESTRIL) 10 MG tablet Take 10 mg by mouth daily as needed (elevated BP).    Yes [provider]  metFORMIN (GLUCOPHAGE) 1000 MG tablet Take 500-1,000 mg by mouth See admin instructions. 1,000 mg every morning, 500 mg every night 10/18/18  Yes [provider]  ondansetron (ZOFRAN) 8 MG tablet Take 1 tablet (8 mg total) by mouth 2 (two) times daily as needed for refractory nausea / vomiting. Start on day 3 after chemotherapy. 11/14/18  Yes Truitt Merle, MD  prochlorperazine (COMPAZINE) 10 MG tablet Take 1 tablet (10 mg total) by mouth every 6 (six) hours as needed (NAUSEA). 05/06/19  Yes Alla Feeling, NP  simvastatin (ZOCOR) 40 MG tablet Take 20 mg by mouth every evening. 09/25/18  Yes [provider]  traMADol (ULTRAM) 50 MG tablet Take 2 tablets (100 mg total) by mouth every 6 (six) hours as needed. Patient taking differently: Take 100 mg by mouth every 6 (six) hours as needed for moderate pain or severe pain.  04/09/19  Yes Truitt Merle, MD  cephALEXin  (KEFLEX) 500 MG capsule Take 1 capsule (500 mg total) by mouth 3 (three) times daily. Patient not taking: Reported on 08/06/2019 05/21/19   Truitt Merle, MD    Allergies    Patient has no known allergies.  Review of Systems   Review of Systems  Constitutional: Positive for fever.  HENT: Negative for sore throat.   Respiratory: Positive for cough. Negative for shortness of breath.   Cardiovascular: Negative for chest pain.  Gastrointestinal: Negative for abdominal pain, nausea and vomiting.  Genitourinary: Negative for flank pain.  Musculoskeletal: Positive for myalgias. Negative for back pain.  Skin: Negative for pallor and wound.  Neurological: Positive for weakness. Negative for light-headedness and headaches.    Physical Exam Updated Vital Signs BP 117/85   Pulse 98   Temp (!) 101.8 F (38.8 C) (Oral)  Resp (!) 26   Ht '6\' 4"'  (1.93 m)   Wt 105.2 kg   SpO2 98%   BMI 28.24 kg/m   Physical Exam Vitals and nursing note reviewed.  Constitutional:      Appearance: Normal appearance. He is toxic-appearing. He is not ill-appearing or diaphoretic.  HENT:     Head: Normocephalic and atraumatic.  Eyes:     Pupils: Pupils are equal, round, and reactive to light.  Cardiovascular:     Comments: No calf tenderness BL.  Pulmonary:     Effort: Pulmonary effort is normal.     Breath sounds: Examination of the right-lower field reveals decreased breath sounds and rales. Examination of the left-lower field reveals decreased breath sounds and rales. Decreased breath sounds and rales present. No rhonchi.  Abdominal:     General: Abdomen is flat.     Tenderness: There is no abdominal tenderness. There is no right CVA tenderness or left CVA tenderness.  Musculoskeletal:     Cervical back: Normal range of motion and neck supple.     Right lower leg: No edema.     Left lower leg: No edema.  Skin:    General: Skin is warm and dry.  Neurological:     Mental Status: He is alert and  oriented to person, place, and time.     ED Results / Procedures / Treatments   Labs (all labs ordered are listed, but only abnormal results are displayed) Labs Reviewed  CBC WITH DIFFERENTIAL/PLATELET - Abnormal; Notable for the following components:      Result Value   RBC 3.67 (*)    Hemoglobin 12.5 (*)    HCT 38.1 (*)    MCV 103.8 (*)    MCH 34.1 (*)    Neutro Abs 8.5 (*)    Lymphs Abs 0.4 (*)    All other components within normal limits  COMPREHENSIVE METABOLIC PANEL - Abnormal; Notable for the following components:   Sodium 132 (*)    Chloride 97 (*)    Glucose, Bld 149 (*)    Calcium 8.5 (*)    All other components within normal limits  D-DIMER, QUANTITATIVE (NOT AT [facility]) - Abnormal; Notable for the following components:   D-Dimer, Quant 1.67 (*)    All other components within normal limits  LACTATE DEHYDROGENASE - Abnormal; Notable for the following components:   LDH 248 (*)    All other components within normal limits  FERRITIN - Abnormal; Notable for the following components:   Ferritin 562 (*)    All other components within normal limits  FIBRINOGEN - Abnormal; Notable for the following components:   Fibrinogen 589 (*)    All other components within normal limits  C-REACTIVE PROTEIN - Abnormal; Notable for the following components:   CRP 16.2 (*)    All other components within normal limits  POC SARS CORONAVIRUS 2 AG -  ED - Abnormal; Notable for the following components:   SARS Coronavirus 2 Ag POSITIVE (*)    All other components within normal limits  CULTURE, BLOOD (ROUTINE X 2)  CULTURE, BLOOD (ROUTINE X 2)  LACTIC ACID, PLASMA  PROCALCITONIN  TRIGLYCERIDES  TROPONIN I (HIGH SENSITIVITY)  TROPONIN I (HIGH SENSITIVITY)    EKG None  Radiology DG Chest Portable 1 View  Result Date: 08/06/2019 CLINICAL DATA:  Shortness of breath, pancreatic cancer EXAM: PORTABLE CHEST 1 VIEW COMPARISON:  08/31/2017 FINDINGS: There is a right-sided Port-A-Cath with  the tip projecting over the SVC. There  is no focal consolidation. There is no pleural effusion or pneumothorax. The heart and mediastinal contours are unremarkable. There is no acute osseous abnormality. IMPRESSION: No active disease. Electronically Signed   By: Kathreen Devoid   On: 08/06/2019 22:01    Procedures Procedures (including critical care time)  Medications Ordered in ED Medications  acetaminophen (TYLENOL) tablet 1,000 mg (has no administration in time range)  dexamethasone (DECADRON) injection 10 mg (10 mg Intravenous Given 08/06/19 2308)    ED Course  I have reviewed the triage vital signs and the nursing notes.  Pertinent labs & imaging results that were available during my care of the patient were reviewed by me and considered in my medical decision making (see chart for details).  Clinical Course as of Aug 06 9  Wed Aug 06, 2019  2312 SARS Coronavirus 2 Ag(!): POSITIVE [JS]    Clinical Course User Index [JS] Janeece Fitting, PA-C   MDM Rules/Calculators/A&P   Patient with a past medical history of metastatic pancreatic cancer presents to the ED with complaints of change in taste, cough for the past 2 weeks.  According to patient he has noted a change in the taste of his wine, reports that he has been doing overall well, gaining weight, following treatment with his last chemo being approximately 15 days ago.  He is currently followed by Dr. Annamaria Boots. According to his chart which have extensive review, wife did call RN at the clinic, stated that patient had become feverish, lethargic.  Patient does not report any fevers, has had some chills at home but states he has no shortness of breath or chest pain.  He arrived in the ED febrile at 101.8, he is hypoxic satting at 80% on room air, no tachypnea, is able to speak in full sentences.  He was placed on 4 L of O2 by me.  Patient has been in and out of confusion centers, high suspicion for COVID-19 infection.  He does not have any  abdominal pain, urinary symptoms, back pain.  Patient was seen in office 2 days ago, according to his records he received 1.5 L of IV fluids normal saline, reports that his dizziness improved after the treatment.  Will obtain x-ray along with further labs in order to evaluate patient's condition.  CBC without leukocytosis, he is currently receiving active chemo therapy. CMP with mild hyponatremia, creatine is at baseline. LDH, Fibrinogen ae elevated.  Rapid Covid test was obtained which was positive.  Xray did not show consolidation, or pleural effusion.   Patient was given 10 mg injection of decadron, due to continued hypoxia will place call for hospitalist admission.   Patient was oxygenating at 77% with 4 L of O2, he was now increased to 8 L, now oxygenating at 92%.RT has been paged in order to start high flow [place].    Patient care signed out to [facility] pending admission.   Portions of this note were generated with Lobbyist. Dictation errors may occur despite best attempts at proofreading.  Final Clinical Impression(s) / ED Diagnoses Final diagnoses:  Hypoxia  Cough  COVID-19 virus infection    Rx / DC Orders ED Discharge Orders    None       Janeece Fitting, PA-C 08/07/19 0010    Gareth Morgan, MD 08/07/19 1350

## 2019-08-06 NOTE — Telephone Encounter (Signed)
Scheduled appt per 12/30 sch message - pt wife aware of appt for respiratory clinic

## 2019-08-07 ENCOUNTER — Encounter (HOSPITAL_COMMUNITY): Payer: Self-pay | Admitting: Internal Medicine

## 2019-08-07 ENCOUNTER — Emergency Department (HOSPITAL_COMMUNITY): Payer: Medicare Other

## 2019-08-07 DIAGNOSIS — D539 Nutritional anemia, unspecified: Secondary | ICD-10-CM | POA: Diagnosis present

## 2019-08-07 DIAGNOSIS — U071 COVID-19: Secondary | ICD-10-CM | POA: Diagnosis present

## 2019-08-07 DIAGNOSIS — Z79899 Other long term (current) drug therapy: Secondary | ICD-10-CM | POA: Diagnosis not present

## 2019-08-07 DIAGNOSIS — Z66 Do not resuscitate: Secondary | ICD-10-CM | POA: Diagnosis present

## 2019-08-07 DIAGNOSIS — J1282 Pneumonia due to coronavirus disease 2019: Secondary | ICD-10-CM | POA: Diagnosis present

## 2019-08-07 DIAGNOSIS — J1289 Other viral pneumonia: Secondary | ICD-10-CM | POA: Diagnosis not present

## 2019-08-07 DIAGNOSIS — D849 Immunodeficiency, unspecified: Secondary | ICD-10-CM | POA: Diagnosis present

## 2019-08-07 DIAGNOSIS — Z7984 Long term (current) use of oral hypoglycemic drugs: Secondary | ICD-10-CM | POA: Diagnosis not present

## 2019-08-07 DIAGNOSIS — I10 Essential (primary) hypertension: Secondary | ICD-10-CM | POA: Diagnosis present

## 2019-08-07 DIAGNOSIS — C787 Secondary malignant neoplasm of liver and intrahepatic bile duct: Secondary | ICD-10-CM | POA: Diagnosis present

## 2019-08-07 DIAGNOSIS — E1165 Type 2 diabetes mellitus with hyperglycemia: Secondary | ICD-10-CM | POA: Diagnosis not present

## 2019-08-07 DIAGNOSIS — J9601 Acute respiratory failure with hypoxia: Secondary | ICD-10-CM | POA: Diagnosis present

## 2019-08-07 DIAGNOSIS — Z79891 Long term (current) use of opiate analgesic: Secondary | ICD-10-CM | POA: Diagnosis not present

## 2019-08-07 DIAGNOSIS — Z7982 Long term (current) use of aspirin: Secondary | ICD-10-CM | POA: Diagnosis not present

## 2019-08-07 DIAGNOSIS — E118 Type 2 diabetes mellitus with unspecified complications: Secondary | ICD-10-CM | POA: Diagnosis present

## 2019-08-07 DIAGNOSIS — E119 Type 2 diabetes mellitus without complications: Secondary | ICD-10-CM

## 2019-08-07 DIAGNOSIS — E860 Dehydration: Secondary | ICD-10-CM | POA: Diagnosis present

## 2019-08-07 DIAGNOSIS — C250 Malignant neoplasm of head of pancreas: Secondary | ICD-10-CM | POA: Diagnosis not present

## 2019-08-07 DIAGNOSIS — D630 Anemia in neoplastic disease: Secondary | ICD-10-CM | POA: Diagnosis present

## 2019-08-07 DIAGNOSIS — C259 Malignant neoplasm of pancreas, unspecified: Secondary | ICD-10-CM | POA: Diagnosis present

## 2019-08-07 DIAGNOSIS — F419 Anxiety disorder, unspecified: Secondary | ICD-10-CM | POA: Diagnosis present

## 2019-08-07 DIAGNOSIS — E7800 Pure hypercholesterolemia, unspecified: Secondary | ICD-10-CM | POA: Diagnosis not present

## 2019-08-07 DIAGNOSIS — E785 Hyperlipidemia, unspecified: Secondary | ICD-10-CM | POA: Diagnosis present

## 2019-08-07 DIAGNOSIS — R791 Abnormal coagulation profile: Secondary | ICD-10-CM | POA: Diagnosis not present

## 2019-08-07 DIAGNOSIS — R05 Cough: Secondary | ICD-10-CM | POA: Diagnosis present

## 2019-08-07 DIAGNOSIS — Z801 Family history of malignant neoplasm of trachea, bronchus and lung: Secondary | ICD-10-CM | POA: Diagnosis not present

## 2019-08-07 LAB — HEMOGLOBIN A1C
Hgb A1c MFr Bld: 6.6 % — ABNORMAL HIGH (ref 4.8–5.6)
Mean Plasma Glucose: 142.72 mg/dL

## 2019-08-07 LAB — TROPONIN I (HIGH SENSITIVITY): Troponin I (High Sensitivity): 6 ng/L (ref <18)

## 2019-08-07 LAB — LACTATE DEHYDROGENASE: LDH: 252 U/L — ABNORMAL HIGH (ref 98–192)

## 2019-08-07 LAB — GLUCOSE, CAPILLARY
Glucose-Capillary: 182 mg/dL — ABNORMAL HIGH (ref 70–99)
Glucose-Capillary: 174 mg/dL — ABNORMAL HIGH (ref 70–99)
Glucose-Capillary: 191 mg/dL — ABNORMAL HIGH (ref 70–99)

## 2019-08-07 LAB — HIV ANTIBODY (ROUTINE TESTING W REFLEX): HIV Screen 4th Generation wRfx: NONREACTIVE

## 2019-08-07 LAB — CBG MONITORING, ED
Glucose-Capillary: 192 mg/dL — ABNORMAL HIGH (ref 70–99)
Glucose-Capillary: 198 mg/dL — ABNORMAL HIGH (ref 70–99)
Glucose-Capillary: 206 mg/dL — ABNORMAL HIGH (ref 70–99)

## 2019-08-07 LAB — ABO/RH: ABO/RH(D): A POS

## 2019-08-07 MED ORDER — ACETAMINOPHEN 500 MG PO TABS: 1000.0000 mg | ORAL_TABLET | Freq: Once | ORAL | Status: DC

## 2019-08-07 MED ORDER — ZINC SULFATE 220 (50 ZN) MG PO CAPS
220.0000 mg | ORAL_CAPSULE | Freq: Every day | ORAL | Status: DC
  Administered 2019-08-07 – 2019-09-03 (×28): 220 mg via ORAL
  Filled 2019-08-07 (×30): qty 1

## 2019-08-07 MED ORDER — ASCORBIC ACID 500 MG PO TABS
500.0000 mg | ORAL_TABLET | Freq: Two times a day (BID) | ORAL | Status: DC
  Administered 2019-08-07 – 2019-09-03 (×55): 500 mg via ORAL
  Filled 2019-08-07 (×58): qty 1

## 2019-08-07 MED ORDER — HYDROCOD POLST-CPM POLST ER 10-8 MG/5ML PO SUER
5.0000 mL | Freq: Two times a day (BID) | ORAL | Status: DC | PRN
  Administered 2019-08-08 – 2019-08-28 (×8): 5 mL via ORAL
  Filled 2019-08-07 (×9): qty 5

## 2019-08-07 MED ORDER — ENOXAPARIN SODIUM 40 MG/0.4ML ~~LOC~~ SOLN
40.0000 mg | Freq: Every day | SUBCUTANEOUS | Status: DC
  Administered 2019-08-07 – 2019-08-18 (×13): 40 mg via SUBCUTANEOUS
  Filled 2019-08-07 (×13): qty 0.4

## 2019-08-07 MED ORDER — VITAMIN D 25 MCG (1000 UNIT) PO TABS
2000.0000 [IU] | ORAL_TABLET | Freq: Every day | ORAL | Status: DC
  Administered 2019-08-07 – 2019-09-03 (×28): 2000 [IU] via ORAL
  Filled 2019-08-07 (×29): qty 2

## 2019-08-07 MED ORDER — GABAPENTIN 300 MG PO CAPS
300.0000 mg | ORAL_CAPSULE | Freq: Every day | ORAL | Status: DC
  Administered 2019-08-07 – 2019-09-02 (×27): 300 mg via ORAL
  Filled 2019-08-07 (×28): qty 1

## 2019-08-07 MED ORDER — DEXAMETHASONE SODIUM PHOSPHATE 10 MG/ML IJ SOLN
6.0000 mg | INTRAMUSCULAR | Status: AC
  Administered 2019-08-07 – 2019-08-16 (×10): 6 mg via INTRAVENOUS
  Filled 2019-08-07 (×10): qty 1

## 2019-08-07 MED ORDER — ACETAMINOPHEN 500 MG PO TABS
1000.0000 mg | ORAL_TABLET | Freq: Once | ORAL | Status: AC
  Administered 2019-08-07: 1000 mg via ORAL
  Filled 2019-08-07: qty 2

## 2019-08-07 MED ORDER — ELTROMBOPAG OLAMINE 25 MG PO TABS
25.0000 mg | ORAL_TABLET | Freq: Every day | ORAL | Status: DC
  Administered 2019-08-11 – 2019-08-12 (×2): 25 mg via ORAL
  Filled 2019-08-07 (×2): qty 1

## 2019-08-07 MED ORDER — ALBUTEROL SULFATE HFA 108 (90 BASE) MCG/ACT IN AERS
2.0000 | INHALATION_SPRAY | Freq: Four times a day (QID) | RESPIRATORY_TRACT | Status: DC | PRN
  Administered 2019-08-11 – 2019-08-20 (×4): 2 via RESPIRATORY_TRACT
  Filled 2019-08-07 (×2): qty 6.7

## 2019-08-07 MED ORDER — INSULIN ASPART 100 UNIT/ML ~~LOC~~ SOLN
0.0000 [IU] | Freq: Three times a day (TID) | SUBCUTANEOUS | Status: DC
  Administered 2019-08-07 – 2019-08-08 (×6): 2 [IU] via SUBCUTANEOUS
  Administered 2019-08-09: 1 [IU] via SUBCUTANEOUS
  Administered 2019-08-09: 3 [IU] via SUBCUTANEOUS
  Filled 2019-08-07: qty 0.09

## 2019-08-07 MED ORDER — TRAMADOL HCL 50 MG PO TABS
100.0000 mg | ORAL_TABLET | Freq: Four times a day (QID) | ORAL | Status: DC | PRN
  Administered 2019-08-18: 100 mg via ORAL
  Filled 2019-08-07: qty 2

## 2019-08-07 MED ORDER — ACETAMINOPHEN 325 MG PO TABS
650.0000 mg | ORAL_TABLET | Freq: Four times a day (QID) | ORAL | Status: DC | PRN
  Administered 2019-08-08 – 2019-08-13 (×3): 650 mg via ORAL
  Filled 2019-08-07 (×4): qty 2

## 2019-08-07 MED ORDER — GUAIFENESIN-DM 100-10 MG/5ML PO SYRP
10.0000 mL | ORAL_SOLUTION | ORAL | Status: DC | PRN
  Administered 2019-08-11 – 2019-09-02 (×6): 10 mL via ORAL
  Filled 2019-08-07 (×7): qty 10

## 2019-08-07 MED ORDER — IOHEXOL 350 MG/ML SOLN
100.0000 mL | Freq: Once | INTRAVENOUS | Status: AC | PRN
  Administered 2019-08-07: 100 mL via INTRAVENOUS

## 2019-08-07 MED ORDER — SIMVASTATIN 20 MG PO TABS
20.0000 mg | ORAL_TABLET | Freq: Every evening | ORAL | Status: DC
  Administered 2019-08-07 – 2019-09-01 (×25): 20 mg via ORAL
  Filled 2019-08-07 (×27): qty 1

## 2019-08-07 MED ORDER — INSULIN ASPART 100 UNIT/ML ~~LOC~~ SOLN
0.0000 [IU] | Freq: Every day | SUBCUTANEOUS | Status: DC
  Filled 2019-08-07: qty 0.05

## 2019-08-07 MED ORDER — SODIUM CHLORIDE 0.9 % IV SOLN
200.0000 mg | Freq: Once | INTRAVENOUS | Status: AC
  Administered 2019-08-07: 03:00:00 200 mg via INTRAVENOUS
  Filled 2019-08-07: qty 200

## 2019-08-07 MED ORDER — SODIUM CHLORIDE 0.9 % IV SOLN
100.0000 mg | Freq: Every day | INTRAVENOUS | Status: AC
  Administered 2019-08-08 – 2019-08-11 (×4): 100 mg via INTRAVENOUS
  Filled 2019-08-07 (×4): qty 20

## 2019-08-07 MED ORDER — SODIUM CHLORIDE (PF) 0.9 % IJ SOLN
INTRAMUSCULAR | Status: AC
  Filled 2019-08-07: qty 50

## 2019-08-07 MED ORDER — ASPIRIN EC 81 MG PO TBEC
81.0000 mg | DELAYED_RELEASE_TABLET | Freq: Every day | ORAL | Status: DC
  Administered 2019-08-07 – 2019-09-03 (×28): 81 mg via ORAL
  Filled 2019-08-07 (×29): qty 1

## 2019-08-07 NOTE — Progress Notes (Signed)
Respiratory Clinic Note   Patient: Vincent [name] Male    DOB: 17-Jan-1951   68 y.o.   MRN: [id number] Visit Date: 08/06/2019  Today's Provider: Mak[place]   Referred by PCP to assess COVID -19 risk.   Subjective:    Covid-19 Nucleic Acid Test Results Lab Results  Component Value Date   SARSCOV2 POSITIVE (A) 08/06/2019    HPI he is being treated for pancreatic cancer with chemotherapy. 12/26-12/27/2020 he developed nonproductive cough and temperature elevation to 101. 12/28-12/30 symptoms of fatigue, chilling, dizziness, and dry heaves were noted.  He describes some discomfort in the right anterior inferior rib cage area.  This is aggravated by cough.  His Oncologist, Dr. Annamaria Boots was contacted by phone and it was recommended he come to the clinic for evaluation.   No current facility-administered medications for this visit. No current outpatient medications on file.  Facility-Administered Medications Ordered in Other Visits:  .  acetaminophen (TYLENOL) tablet 650 mg, 650 mg, Oral, Q6H PRN, Shela Leff, MD .  albuterol (VENTOLIN HFA) 108 (90 Base) MCG/ACT inhaler 2 puff, 2 puff, Inhalation, Q6H PRN, Shela Leff, MD .  ascorbic acid (VITAMIN C) tablet 500 mg, 500 mg, Oral, BID, Shela Leff, MD, 500 mg at 08/07/19 [id number] .  aspirin EC tablet 81 mg, 81 mg, Oral, QPC breakfast, Shela Leff, MD, 81 mg at 08/07/19 [id number] .  chlorpheniramine-HYDROcodone (TUSSIONEX) 10-8 MG/5ML suspension 5 mL, 5 mL, Oral, Q12H PRN, Shela Leff, MD .  cholecalciferol (VITAMIN D3) tablet 2,000 Units, 2,000 Units, Oral, Daily, Shela Leff, MD, 2,000 Units at 08/07/19 [phone] .  dexamethasone (DECADRON) injection 6 mg, 6 mg, Intravenous, Q24H, Shela Leff, MD, 6 mg at 08/07/19 0925 .  eltrombopag (PROMACTA) tablet 25 mg, 25 mg, Oral, Daily, Rathore, Vasundhra, MD .  enoxaparin (LOVENOX) injection 40 mg, 40 mg, Subcutaneous, QHS, Shela Leff, MD,  40 mg at 08/07/19 0243 .  gabapentin (NEURONTIN) capsule 300 mg, 300 mg, Oral, QHS, Shela Leff, MD, 300 mg at 08/07/19 0244 .  guaiFENesin-dextromethorphan (ROBITUSSIN DM) 100-10 MG/5ML syrup 10 mL, 10 mL, Oral, Q4H PRN, Shela Leff, MD .  insulin aspart (novoLOG) injection 0-5 Units, 0-5 Units, Subcutaneous, QHS, Rathore, Vasundhra, MD .  insulin aspart (novoLOG) injection 0-9 Units, 0-9 Units, Subcutaneous, TID WC, Shela Leff, MD, 2 Units at 08/07/19 1254 .  [COMPLETED] remdesivir 200 mg in sodium chloride 0.9% 250 mL IVPB, 200 mg, Intravenous, Once, Stopped at 08/07/19 0341 **FOLLOWED BY** [START ON 08/08/2019] remdesivir 100 mg in sodium chloride 0.9 % 100 mL IVPB, 100 mg, Intravenous, Daily, Shela Leff, MD .  simvastatin (ZOCOR) tablet 20 mg, 20 mg, Oral, QPM, Shela Leff, MD .  traMADol (ULTRAM) tablet 100 mg, 100 mg, Oral, Q6H PRN, Shela Leff, MD .  zinc sulfate capsule 220 mg, 220 mg, Oral, Daily, Shela Leff, MD, 220 mg at 08/07/19 [id number]  No Known Allergies  Review of Systems The gentleman is very stoic and tends to minimize complaints.  His wife actually validated the dizziness and dry heaves.  Despite document tachycardia and profound hypoxemia; he denied any active cardiopulmonary symptoms. He has numbness and tingling in his toes which he relates to chemotherapy.  Eyes: No redness, discharge, pain, vision change ENT/mouth: No nasal congestion,  purulent discharge, earache, change in hearing, sore throat  Cardiovascular: No anginal type chest pain, palpitations, paroxysmal nocturnal dyspnea, claudication, edema  Respiratory: No sputum production, hemoptysis, DOE   Gastrointestinal: No heartburn, dysphagia, abdominal pain, vomiting, rectal bleeding, melena, change  in bowels Genitourinary: No dysuria, hematuria, pyuria, incontinence, nocturia Musculoskeletal: No joint stiffness, joint swelling, weakness, pain Dermatologic: No rash,  pruritus, change in appearance of skin Neurologic: No  headache, syncope, seizures Psychiatric: No significant anxiety, depression, insomnia, anorexia Endocrine: No change in hair/skin/nails, excessive thirst, excessive hunger, excessive urination  Hematologic/lymphatic: No significant bruising, lymphadenopathy, abnormal bleeding Allergy/immunology: No itchy/watery eyes, significant sneezing, urticaria, angioedema    Objective:   BP 112/76   Pulse 97   Temp (!) 96.8 F (36 C)   Ht 6\' 4"  (1.93 m)   Wt 219 lb 12.8 oz (99.7 kg)   SpO2 (!) 71%   BMI 26.75 kg/m   Physical Exam  Pertinent or positive findings: Pulse recheck was 104.  O2 sats were 84% on room air.  Only after 2 L of nasal oxygen was applied did O2 sat rise above 90%. S4 tachycardia is present.  No rub is auscultated at the anterior right inferior rib cage.  Abdomen is protuberant.  Pedal pulses are surprisingly strong.  Bevelyn Buckles' sign is negative.  General appearance:  no acute distress, increased work of breathing is present.   Lymphatic: No lymphadenopathy about the head, neck, axilla. Eyes: No conjunctival inflammation or lid edema is present. There is no scleral icterus. Ears:  External ear exam shows no significant lesions or deformities.   Nose:  External nasal examination shows no deformity or inflammation. Nasal mucosa are pink and moist without lesions, exudates Oral exam:  Lips and gums are healthy appearing. There is no oropharyngeal erythema or exudate. Neck:  No thyromegaly, masses, tenderness noted.    Heart:  No murmur, click, rub .  Lungs:  without wheezes, rhonchi, rales. Abdomen: Bowel sounds are normal. Abdomen is soft and nontender with no organomegaly, hernias, masses. GU: Deferred  Extremities:  No cyanosis, clubbing, edema  Skin: Warm & dry w/o tenting. No significant lesions or rash.         Assessment & Plan    #1 fever to 101 #2 pleuritic type chest pain #3 hypoxemia #4 pancreatic  cancer on chemotherapy  Plan: non emergency ambulance transport to ED to R/O Covid 19 infection in compromised host with significant hypoxemia with associated risk of dysrhythmias or MI.    [facility]

## 2019-08-07 NOTE — Progress Notes (Signed)
These preliminary result these preliminary results were noted.  Awaiting final report.

## 2019-08-07 NOTE — ED Notes (Signed)
RN informed Tiff RN at [facility] that patient has just left with [facility].

## 2019-08-07 NOTE — Patient Instructions (Signed)
See assessment and plan under each diagnosis in the problem list and acutely for this visit 

## 2019-08-07 NOTE — ED Notes (Signed)
Report called to [facility] report given to Joey, RN to questions at this time

## 2019-08-07 NOTE — ED Notes (Signed)
Respiratory Therapy has been called to assess patient due to drop in O2 Saturation.

## 2019-08-07 NOTE — Progress Notes (Signed)
PROGRESS NOTE    Vincent [name]  [id number] DOB: 06/11/1951 DOA: 08/06/2019 PCP: Vincent Downing, MD    Brief Narrative:  Vincent [name] is a 68 y.o. male with medical history significant of non-insulin-dependent type 2 diabetes, hypertension, metastatic pancreatic cancer on chemo presenting to the ED for evaluation of generalized weakness and cough.  Patient states he has been feeling intermittently dizzy when going from sitting to standing position for the past 10 days.  This usually happens when he tries to get up too quickly.  For the past 5 days he is having a dry cough.  He is also having chills.  No fevers, shortness of breath, nausea, vomiting, abdominal pain, diarrhea, fatigue, or body aches.  Patient has experienced loss of sense of taste/smell but states his appetite is still good.  ED Course: SARS-CoV-2 rapid antigen test positive.  Febrile with temperature 101.8 F.  Oxygen saturation 80% on room air and patient was initially placed on 4 L supplemental oxygen. Subsequently desatted to 77% and was placed on 8 L supplemental oxygen via high flow nasal cannula.  No leukocytosis.  Lactic acid normal.  Procalcitonin 0.19.  Hemoglobin 12.5, no significant change compared to recent labs.  Platelet count 153,000.  Corrected sodium 133.  BUN 16, creatinine 1.0.  LFTs normal.  Inflammatory markers elevated: D-dimer 1.67, LDH 248, ferritin 562, fibrinogen 589, and CRP 16.2.  High-sensitivity troponin x2 negative.  EKG without acute ischemic changes.  Blood culture x2 pending.  Chest x-ray showing no active disease. Patient received Tylenol and IV Decadron 10 mg.   Assessment & Plan:   Principal Problem:   Pneumonia due to COVID-19 virus Active Problems:   Pancreatic cancer ([place])   Acute respiratory failure with hypoxia ([place])   Type 2 diabetes mellitus ([facility])   HLD (hyperlipidemia)   Acute hypoxic respiratory failure secondary to acute Covid-19 viral pneumonia during  the ongoing 2020 Covid 19 Pandemic - POA Patient presenting with fever 101.8, hypoxia with SPO2 80% on room air.  Lactic acid within normal limits, WBC count 9.4, Covid-19+ on 08/06/2019.  Chest x-ray with no acute cardiopulmonary disease process.  Procalcitonin 0.19. Inflammatory markers elevated: D-dimer 1.67, LDH 248, ferritin 562, fibrinogen 589, and CRP 16.2. CT angiogram negative for PE.  Showing patchy heterogenous groundglass opacities throughout all lobes of both lungs consistent with COVID-19 viral pneumonia.   --Continue remdesivir, plan 5-day course; Day #2/5 --Continue Decadron 6 mg IV daily --Unable to give Actemra due to patient's immunocompromised status (metastatic pancreatic cancer currently on chemo). --Continue supplemental oxygen, titrate to maintain SPO2 greater than 92%; currently on 8 L high flow nasal cannula, will be transported on [facility] until arrives at [facility] --Continue supportive care with vitamin C, zinc, Tylenol, albuterol MDI --Follow CBC, CMP, D-dimer, ferritin, and CRP daily --Continue airborne/contact isolation precautions --Continuous pulse oximetry  Noninsulin-dependent type 2 diabetes Globin A1c 6.6, well controlled --Continue sensitive sliding scale insulin for further coverage --CBGs qAC/HS   Metastatic pancreatic cancer Currently on chemo.  Dr. Burr [name] is his oncologist. --Annita Brod --outpatient oncology follow-up  Hyperlipidemia --Continue Zocor   DVT prophylaxis: Lovenox Code Status: DNR Family Communication: Discussed with patient extensively at bedside prior to being transported by [facility] to [facility] this morning. Disposition Plan: Transfer to [facility]   Consultants:   None  Procedures:   None  Antimicrobials:   Remdesivir   Subjective: Patient seen and examined at bedside, resting comfortably, continues on high flow nasal cannula 8 L/min.  In good spirits this morning.  About to be transported to [facility] for [facility] care.  Complains  of mild dyspnea at rest which is extremely exacerbated with any type of exertion; especially when trying to ambulate to the bedside commode this morning.  No other specific complaints or concerns this morning.  Denies headache, no fever/chills/night sweats, no chest pain, no palpitations, no nausea/vomiting/diarrhea, no abdominal pain.  No acute concerns overnight per nursing staff.  Objective: Vitals:   08/07/19 0915 08/07/19 0930 08/07/19 0945 08/07/19 1000  BP: 107/77 117/83 116/84 117/73  Pulse: (!) 59 63 64 (!) 58  Resp: 15 17 19  (!) 24  Temp:      TempSrc:      SpO2: 92% 95% 95% 99%  Weight:      Height:       No intake or output data in the 24 hours ending 08/07/19 1034 Filed Weights   08/06/19 2031  Weight: 105.2 kg    Examination:  General exam: Appears calm and comfortable, wd/wn  Respiratory system: Slightly tachypneic, breath sounds clear to auscultation bilaterally, no accessory muscle use, no wheezing/crackles, oxygenating 95% on 8 L high flow nasal cannula Cardiovascular system: S1 & S2 heard, RRR. No JVD, murmurs, rubs, gallops or clicks. No pedal edema. Gastrointestinal system: Abdomen is nondistended, soft and nontender. No organomegaly or masses felt. Normal bowel sounds heard. Central nervous system: Alert and oriented. No focal neurological deficits. Extremities: Symmetric 5 x 5 power. Skin: No rashes, lesions or ulcers Psychiatry: Judgement and insight appear normal. Mood & affect appropriate.     Data Reviewed: I have personally reviewed following labs and imaging studies  CBC: Recent Labs  Lab 08/04/19 1152 08/06/19 2153  WBC 14.7* 9.4  NEUTROABS 12.7* 8.5*  HGB 12.7* 12.5*  HCT 39.3 38.1*  MCV 103.7* 103.8*  PLT 122* 0000000   Basic Metabolic Panel: Recent Labs  Lab 08/04/19 1152 08/06/19 2153  NA 133* 132*  K 4.1 3.9  CL 99 97*  CO2 24 24  GLUCOSE 179* 149*  BUN 20 16  CREATININE 1.50* 1.04  CALCIUM 8.6* 8.5*   GFR: Estimated  Creatinine Clearance: 93.1 mL/min (by C-G formula based on SCr of 1.04 mg/dL). Liver Function Tests: Recent Labs  Lab 08/04/19 1152 08/06/19 2153  AST 23 38  ALT 24 38  ALKPHOS 132* 81  BILITOT 1.1 0.9  PROT 7.3 6.9  ALBUMIN 3.8 3.5   No results for input(s): LIPASE, AMYLASE in the last 168 hours. No results for input(s): AMMONIA in the last 168 hours. Coagulation Profile: No results for input(s): INR, PROTIME in the last 168 hours. Cardiac Enzymes: No results for input(s): CKTOTAL, CKMB, CKMBINDEX, TROPONINI in the last 168 hours. BNP (last 3 results) No results for input(s): PROBNP in the last 8760 hours. HbA1C: Recent Labs    08/07/19 0147  HGBA1C 6.6*   CBG: Recent Labs  Lab 08/07/19 0211 08/07/19 0755 08/07/19 0830  GLUCAP 192* 198* 206*   Lipid Profile: Recent Labs    08/06/19 2154  TRIG 77   Thyroid Function Tests: No results for input(s): TSH, T4TOTAL, FREET4, T3FREE, THYROIDAB in the last 72 hours. Anemia Panel: Recent Labs    08/06/19 2153  FERRITIN 562*   Sepsis Labs: Recent Labs  Lab 08/06/19 2153  PROCALCITON 0.19  LATICACIDVEN 1.6    Recent Results (from the past 240 hour(s))  Urine culture     Status: None   Collection Time: 08/04/19 11:18 AM   Specimen: Urine,  Clean Catch  Result Value Ref Range Status   Specimen Description   Final    URINE, CLEAN CATCH Performed at [facility], [place] [address], [place], [place] [address]    Special Requests   Final    NONE Performed at [facility], [place] [address], [place], [place] [address]    Culture   Final    NO GROWTH Performed at [facility], [place] [address] [place], [place] [address]    Report Status 08/05/2019 FINAL  Final  Culture, blood (routine x 2)     Status: None (Preliminary result)   Collection Time: 08/04/19 11:32 AM   Specimen: BLOOD LEFT ARM  Result Value Ref Range Status   Specimen Description   Final    BLOOD  LEFT ARM Performed at [facility], [place] [address], [place], [place] [address]    Special Requests   Final    NONE Performed at [facility], [place] [address] [place], [place] [address]    Culture   Final    NO GROWTH 2 DAYS Performed at [place] [address], [place], [place] [address]    Report Status PENDING  Incomplete  Culture, Blood     Status: None (Preliminary result)   Collection Time: 08/04/19 11:52 AM   Specimen: Porta Cath; Blood  Result Value Ref Range Status   Specimen Description   Final    PORTA CATH Performed at [facility], [place] [address] [place], [place] [address]    Special Requests   Final    NONE Performed at [facility], [place] [address] [place], [place] [address]    Culture   Final    NO GROWTH 2 DAYS Performed at [employer] [address], [place], [place] [address]    Report Status PENDING  Incomplete         Radiology Studies: CT Angio Chest PE W and/or Wo Contrast  Result Date: 08/07/2019 CLINICAL DATA:  Hypoxemia. Cough and weakness. History of pancreatic cancer with ongoing chemotherapy. COVID-19 positive. EXAM: CT ANGIOGRAPHY CHEST WITH CONTRAST TECHNIQUE: Multidetector CT imaging of the chest was performed using the standard protocol during bolus administration of intravenous contrast. Multiplanar CT image reconstructions and MIPs were obtained to evaluate the vascular anatomy. CONTRAST:  137mL OMNIPAQUE IOHEXOL 350 MG/ML SOLN COMPARISON:  Radiograph yesterday. Chest CT 11/27/2018 FINDINGS: Cardiovascular: There are no filling defects within the pulmonary arteries to suggest pulmonary embolus. Subsegmental branches are not well assessed due to contrast bolus timing, as well as breathing motion artifact through the lower lobes. Right chest port with tip in the SVC. Mild aortic tortuosity without acute abnormality. No aortic  aneurysm. Borderline cardiomegaly. Coronary artery calcifications. Mediastinum/Nodes: No enlarged mediastinal or hilar lymph nodes. No visualized thyroid nodule. No esophageal wall thickening. Lungs/Pleura: Patchy heterogeneous ground-glass opacities throughout all lobes of both lungs, moderate to extensive parenchymal involvement. No septal thickening or pulmonary edema. No pleural fluid. Upper Abdomen: Low-density in the left lobe of the liver, patient with known metastatic disease, not well characterized on the current exam due to phase of contrast. Musculoskeletal: Multilevel degenerative change in the spine. No focal bone lesion. Review of the MIP images confirms the above findings. IMPRESSION: 1. No pulmonary embolus. 2. Patchy heterogeneous ground-glass opacities throughout all lobes of both lungs, consistent with pneumonia, pattern typical of COVID-19. There is moderate to extensive  parenchymal involvement. 3. Borderline cardiomegaly. Coronary artery calcifications. Aortic Atherosclerosis (ICD10-I70.0). Electronically Signed   By: Keith Rake M.D.   On: 08/07/2019 03:11   DG Chest Portable 1 View  Result Date: 08/06/2019 CLINICAL DATA:  Shortness of breath, pancreatic cancer EXAM: PORTABLE CHEST 1 VIEW COMPARISON:  08/31/2017 FINDINGS: There is a right-sided Port-A-Cath with the tip projecting over the SVC. There is no focal consolidation. There is no pleural effusion or pneumothorax. The heart and mediastinal contours are unremarkable. There is no acute osseous abnormality. IMPRESSION: No active disease. Electronically Signed   By: Kathreen Devoid   On: 08/06/2019 22:01        Scheduled Meds: . vitamin C  500 mg Oral BID  . aspirin EC  81 mg Oral QPC breakfast  . cholecalciferol  2,000 Units Oral Daily  . dexamethasone (DECADRON) injection  6 mg Intravenous Q24H  . eltrombopag  25 mg Oral Daily  . enoxaparin (LOVENOX) injection  40 mg Subcutaneous QHS  . gabapentin  300 mg Oral QHS  .  insulin aspart  0-5 Units Subcutaneous QHS  . insulin aspart  0-9 Units Subcutaneous TID WC  . simvastatin  20 mg Oral QPM  . zinc sulfate  220 mg Oral Daily   Continuous Infusions: . [START ON 08/08/2019] remdesivir 100 mg in NS 100 mL       LOS: 0 days    Time spent: 39 minutes spent on chart review, discussion with nursing staff, consultants, updating family and interview/physical exam; more than 50% of that time was spent in counseling and/or coordination of care.    Jozlynn Plaia J [place] DO [facility] 08/07/2019, 10:34 AM

## 2019-08-07 NOTE — Plan of Care (Signed)

## 2019-08-07 NOTE — H&P (Signed)
History and Physical    Vincent [name] JWJ:[id number] DOB: 1951-02-16 DOA: 08/06/2019  PCP: Leonard Downing, MD  Chief Complaint: Dizziness, cough  HPI: Vincent [name] is a 68 y.o. male with medical history significant of non-insulin-dependent type 2 diabetes, hypertension, metastatic pancreatic cancer on chemo presenting to the ED for evaluation of generalized weakness and cough.  Patient states he has been feeling intermittently dizzy when going from sitting to standing position for the past 10 days.  This usually happens when he tries to get up too quickly.  For the past 5 days he is having a dry cough.  He is also having chills.  No fevers, shortness of breath, nausea, vomiting, abdominal pain, diarrhea, fatigue, or body aches.  Patient has experienced loss of sense of taste/smell but states his appetite is still good.  ED Course: SARS-CoV-2 rapid antigen test positive.  Febrile with temperature 101.8 F.  Oxygen saturation 80% on room air and patient was initially placed on 4 L supplemental oxygen.  Subsequently desatted to 77% and was placed on 8 L supplemental oxygen via high flow nasal cannula.  No leukocytosis.  Lactic acid normal.  Procalcitonin 0.19.  Hemoglobin 12.5, no significant change compared to recent labs.  Platelet count 153,000.  Corrected sodium 133.  BUN 16, creatinine 1.0.  LFTs normal.  Inflammatory markers elevated: D-dimer 1.67, LDH 248, ferritin 562, fibrinogen 589, and CRP 16.2.  High-sensitivity troponin x2 negative.  EKG without acute ischemic changes.  Blood culture x2 pending.  Chest x-ray showing no active disease. Patient received Tylenol and IV Decadron 10 mg.  Review of Systems:  All systems reviewed and apart from history of presenting illness, are negative.  Past Medical History:  Diagnosis Date  . Diabetes mellitus without complication ([place])    pt. states that he is not diabeticalthought he takes metformin  . Eye hemorrhage    right  .  Family history of lung cancer   . Hypertension   . Monoallelic mutation of BRIP1 gene   . Monoallelic mutation of MUTYH gene     Past Surgical History:  Procedure Laterality Date  . ESOPHAGOGASTRODUODENOSCOPY (EGD) WITH PROPOFOL N/A 10/29/2018   Procedure: ESOPHAGOGASTRODUODENOSCOPY (EGD) WITH PROPOFOL;  Surgeon: Arta Silence, MD;  Location: [facility];  Service: Endoscopy;  Laterality: N/A;  . EUS N/A 10/29/2018   Procedure: FULL UPPER ENDOSCOPIC ULTRASOUND (EUS) RADIAL;  Surgeon: Arta Silence, MD;  Location: [facility];  Service: Endoscopy;  Laterality: N/A;  . FINE NEEDLE ASPIRATION N/A 10/29/2018   Procedure: FINE NEEDLE ASPIRATION (FNA) LINEAR;  Surgeon: Arta Silence, MD;  Location: [facility];  Service: Endoscopy;  Laterality: N/A;  . IR IMAGING GUIDED PORT INSERTION  11/11/2018  . LUMBAR LAMINECTOMY/DECOMPRESSION MICRODISCECTOMY Left 10/11/2017   Procedure: Microlumbar decompression Lumbar Four-Five Left;  Surgeon: Susa Day, MD;  Location: [facility] [place];  Service: Orthopedics;  Laterality: Left;  Microlumbar decompression Lumbar Four-Five Left  . removal of wisdom teeth  1977     reports that he has never smoked. He has never used smokeless tobacco. He reports current alcohol use. He reports that he does not use drugs.  No Known Allergies  Family History  Problem Relation Age of Onset  . Cancer Mother 53       precancerous cells, thinks possibly ovarian     Prior to Admission medications   Medication Sig Start Date End Date Taking? Authorizing Provider  Accu-Chek FastClix Lancets MISC daily. for testing 10/10/18  Yes [provider]  aspirin  EC 81 MG tablet Take 1 tablet (81 mg total) by mouth daily after breakfast. Resume 4 days post-op 10/11/17  Yes Lacie Draft M, PA-C  eltrombopag (PROMACTA) 25 MG tablet Take 1 tablet (25 mg total) by mouth daily. Take on an empty stomach, 1 hour before a meal or 2 hours after. 07/23/19  Yes Truitt Merle, MD  gabapentin  (NEURONTIN) 100 MG capsule Take 3 capsules (300 mg total) by mouth at bedtime. May gradually increase to 300 mg at night 07/23/19  Yes Burton, Wilhemina Cash, NP  Ginger, Zingiber officinalis, (GINGER ROOT PO) Take 750 mg by mouth.   Yes [provider]  KLOR-CON M20 20 MEQ tablet TAKE 1 TABLET (20 MEQ TOTAL) BY MOUTH EVERY OTHER DAY. Patient taking differently: Take 20 mEq by mouth every other day.  08/05/19  Yes Truitt Merle, MD  lidocaine-prilocaine (EMLA) cream Apply to affected area once 11/14/18  Yes Truitt Merle, MD  lisinopril (PRINIVIL,ZESTRIL) 10 MG tablet Take 10 mg by mouth daily as needed (elevated BP).    Yes [provider]  metFORMIN (GLUCOPHAGE) 1000 MG tablet Take 500-1,000 mg by mouth See admin instructions. 1,000 mg every morning, 500 mg every night 10/18/18  Yes [provider]  ondansetron (ZOFRAN) 8 MG tablet Take 1 tablet (8 mg total) by mouth 2 (two) times daily as needed for refractory nausea / vomiting. Start on day 3 after chemotherapy. 11/14/18  Yes Truitt Merle, MD  prochlorperazine (COMPAZINE) 10 MG tablet Take 1 tablet (10 mg total) by mouth every 6 (six) hours as needed (NAUSEA). 05/06/19  Yes Alla Feeling, NP  simvastatin (ZOCOR) 40 MG tablet Take 20 mg by mouth every evening. 09/25/18  Yes [provider]  traMADol (ULTRAM) 50 MG tablet Take 2 tablets (100 mg total) by mouth every 6 (six) hours as needed. Patient taking differently: Take 100 mg by mouth every 6 (six) hours as needed for moderate pain or severe pain.  04/09/19  Yes Truitt Merle, MD  cephALEXin (KEFLEX) 500 MG capsule Take 1 capsule (500 mg total) by mouth 3 (three) times daily. Patient not taking: Reported on 08/06/2019 05/21/19   Truitt Merle, MD    Physical Exam: Vitals:   08/07/19 0600 08/07/19 0624 08/07/19 0640 08/07/19 0700  BP: (!) 83/63 91/63  (!) 93/53  Pulse: 65 61 60 69  Resp: (!) 24 (!) 22 (!) 21 15  Temp:      TempSrc:      SpO2: 93% 96% 96% 96%  Weight:      Height:         Physical Exam  Constitutional: He is oriented to person, place, and time. He appears well-developed and well-nourished. No distress.  HENT:  Head: Normocephalic.  Eyes: Right eye exhibits no discharge. Left eye exhibits no discharge.  Cardiovascular: Normal rate, regular rhythm and intact distal pulses.  Pulmonary/Chest: Breath sounds normal. He is in respiratory distress. He has no wheezes. He has no rales.  Tachypneic with respiratory rate in the upper 20s Oxygen saturation 95% on 8 L oxygen via high flow nasal cannula  Abdominal: Soft. Bowel sounds are normal. He exhibits no distension. There is no abdominal tenderness. There is no guarding.  Musculoskeletal:        General: No edema.     Cervical back: Neck supple.  Neurological: He is alert and oriented to person, place, and time. No cranial nerve deficit.  No focal motor or sensory deficit  Skin: Skin is warm. He  is diaphoretic.     Labs on Admission: I have personally reviewed following labs and imaging studies  CBC: Recent Labs  Lab 08/04/19 1152 08/06/19 2153  WBC 14.7* 9.4  NEUTROABS 12.7* 8.5*  HGB 12.7* 12.5*  HCT 39.3 38.1*  MCV 103.7* 103.8*  PLT 122* 563   Basic Metabolic Panel: Recent Labs  Lab 08/04/19 1152 08/06/19 2153  NA 133* 132*  K 4.1 3.9  CL 99 97*  CO2 24 24  GLUCOSE 179* 149*  BUN 20 16  CREATININE 1.50* 1.04  CALCIUM 8.6* 8.5*   GFR: Estimated Creatinine Clearance: 93.1 mL/min (by C-G formula based on SCr of 1.04 mg/dL). Liver Function Tests: Recent Labs  Lab 08/04/19 1152 08/06/19 2153  AST 23 38  ALT 24 38  ALKPHOS 132* 81  BILITOT 1.1 0.9  PROT 7.3 6.9  ALBUMIN 3.8 3.5   No results for input(s): LIPASE, AMYLASE in the last 168 hours. No results for input(s): AMMONIA in the last 168 hours. Coagulation Profile: No results for input(s): INR, PROTIME in the last 168 hours. Cardiac Enzymes: No results for input(s): CKTOTAL, CKMB, CKMBINDEX, TROPONINI in the last 168  hours. BNP (last 3 results) No results for input(s): PROBNP in the last 8760 hours. HbA1C: No results for input(s): HGBA1C in the last 72 hours. CBG: Recent Labs  Lab 08/07/19 0211  GLUCAP 192*   Lipid Profile: Recent Labs    08/06/19 2154  TRIG 77   Thyroid Function Tests: No results for input(s): TSH, T4TOTAL, FREET4, T3FREE, THYROIDAB in the last 72 hours. Anemia Panel: Recent Labs    08/06/19 2153  FERRITIN 562*   Urine analysis:    Component Value Date/Time   COLORURINE YELLOW 08/04/2019 1119   APPEARANCEUR CLEAR 08/04/2019 1119   LABSPEC 1.021 08/04/2019 1119   PHURINE 5.0 08/04/2019 1119   GLUCOSEU NEGATIVE 08/04/2019 1119   HGBUR NEGATIVE 08/04/2019 1119   BILIRUBINUR NEGATIVE 08/04/2019 1119   KETONESUR 5 (A) 08/04/2019 1119   PROTEINUR 30 (A) 08/04/2019 1119   NITRITE NEGATIVE 08/04/2019 1119   LEUKOCYTESUR NEGATIVE 08/04/2019 1119    Radiological Exams on Admission: CT Angio Chest PE W and/or Wo Contrast  Result Date: 08/07/2019 CLINICAL DATA:  Hypoxemia. Cough and weakness. History of pancreatic cancer with ongoing chemotherapy. COVID-19 positive. EXAM: CT ANGIOGRAPHY CHEST WITH CONTRAST TECHNIQUE: Multidetector CT imaging of the chest was performed using the standard protocol during bolus administration of intravenous contrast. Multiplanar CT image reconstructions and MIPs were obtained to evaluate the vascular anatomy. CONTRAST:  145m OMNIPAQUE IOHEXOL 350 MG/ML SOLN COMPARISON:  Radiograph yesterday. Chest CT 11/27/2018 FINDINGS: Cardiovascular: There are no filling defects within the pulmonary arteries to suggest pulmonary embolus. Subsegmental branches are not well assessed due to contrast bolus timing, as well as breathing motion artifact through the lower lobes. Right chest port with tip in the SVC. Mild aortic tortuosity without acute abnormality. No aortic aneurysm. Borderline cardiomegaly. Coronary artery calcifications. Mediastinum/Nodes: No  enlarged mediastinal or hilar lymph nodes. No visualized thyroid nodule. No esophageal wall thickening. Lungs/Pleura: Patchy heterogeneous ground-glass opacities throughout all lobes of both lungs, moderate to extensive parenchymal involvement. No septal thickening or pulmonary edema. No pleural fluid. Upper Abdomen: Low-density in the left lobe of the liver, patient with known metastatic disease, not well characterized on the current exam due to phase of contrast. Musculoskeletal: Multilevel degenerative change in the spine. No focal bone lesion. Review of the MIP images confirms the above findings. IMPRESSION: 1. No pulmonary embolus. 2.  Patchy heterogeneous ground-glass opacities throughout all lobes of both lungs, consistent with pneumonia, pattern typical of COVID-19. There is moderate to extensive parenchymal involvement. 3. Borderline cardiomegaly. Coronary artery calcifications. Aortic Atherosclerosis (ICD10-I70.0). Electronically Signed   By: Keith Rake M.D.   On: 08/07/2019 03:11   DG Chest Portable 1 View  Result Date: 08/06/2019 CLINICAL DATA:  Shortness of breath, pancreatic cancer EXAM: PORTABLE CHEST 1 VIEW COMPARISON:  08/31/2017 FINDINGS: There is a right-sided Port-A-Cath with the tip projecting over the SVC. There is no focal consolidation. There is no pleural effusion or pneumothorax. The heart and mediastinal contours are unremarkable. There is no acute osseous abnormality. IMPRESSION: No active disease. Electronically Signed   By: Kathreen Devoid   On: 08/06/2019 22:01    EKG: Independently reviewed.  Sinus rhythm, LVH.  No significant change since prior tracing.  Assessment/Plan Principal Problem:   Pneumonia due to COVID-19 virus Active Problems:   Pancreatic cancer ([place])   Acute respiratory failure with hypoxia (H[facility])   Type 2 diabetes mellitus ([facility])   HLD (hyperlipidemia)   Acute hypoxic respiratory failure secondary to COVID-19 multifocal pneumonia SARS-CoV-2 rapid  antigen test positive.  Febrile with temperature 101.8 F.  Oxygen saturation 80% on room air and currently requiring 8 L supplemental oxygen via high flow nasal cannula.  Labs showing no leukocytosis or significant elevation of procalcitonin.  Lactic acid normal. Inflammatory markers elevated: D-dimer 1.67, LDH 248, ferritin 562, fibrinogen 589, and CRP 16.2. CT angiogram negative for PE.  Showing patchy heterogenous groundglass opacities throughout all lobes of both lungs consistent with COVID-19 viral pneumonia.   -Remdesivir dosing per pharmacy -IV Decadron 6 mg daily -Unable to give Actemra due to patient's immunocompromised status (metastatic pancreatic cancer currently on chemo). -Vitamin C, zinc, vitamin D -Antitussives as needed -Tylenol as needed -Inhaler as needed -Daily CBC with differential, CMP, CRP, D-dimer, LDH -Airborne and contact precautions -Continuous pulse ox -Supplemental oxygen as needed to keep oxygen saturation above 90% -Blood culture x2 pending  Noninsulin-dependent type 2 diabetes -Check A1c.  Sliding scale insulin sensitive ACHS and CBG checks.  Metastatic pancreatic cancer Currently on chemo.  Dr. Burr Medico is his oncologist. -Continue Promacta -Ensure oncology follow-up  Hyperlipidemia -Continue Zocor  Dizziness/lightheadedness Suspect related to orthostatic hypotension from dehydration.  Patient reports dizziness/lightheadedness when going from sitting to standing position.  He received 1.5 L normal saline boluses at oncology office prior to ED arrival and states he is feeling better now. -Recheck orthostatics  DVT prophylaxis: Lovenox Code Status: Patient wishes to be DNR. Family Communication: No family at bedside. Disposition Plan: Anticipate discharge after clinical improvement Consults called: None Admission status: It is my clinical opinion that admission to INPATIENT is reasonable and necessary in this 68 y.o. male . presenting with acute hypoxic  respiratory failure secondary to COVID-19 viral pneumonia.  Very high risk of decompensation.  Given the aforementioned, the predictability of an adverse outcome is felt to be significant. I expect that the patient will require at least 2 midnights in the hospital to treat this condition.   The medical decision making on this patient was of high complexity and the patient is at high risk for clinical deterioration, therefore this is a level 3 visit.  Shela Leff MD [facility] Pager [phone]  If 7PM-7AM, please contact night-coverage [email or web address] Password [email or web address]  08/07/2019, 7:25 AM

## 2019-08-07 NOTE — Progress Notes (Signed)
Called to ED by PT RN for low Sp02. When RT arrived PT Sp02 on 10 LPM HFNC (per RN) 87%. RT encouraged RN to utilize high flow nasal cannula up to 15 lpm if needed, also to raise PT head of bed, and  to prone if PT able. RN did state that PT completed ortho BP measurements approx 15 min ago (discussed that PT may have low reserve). Requested RN notify RT if more assistance is needed.

## 2019-08-07 NOTE — ED Notes (Signed)
Attempted to call report to [facility] x2 sent to voicemail

## 2019-08-07 NOTE — ED Provider Notes (Signed)
Pancreatic patient on chemo - last session 21st Dec loss of taste and smell since Christmas No SOB, CP Sent here for COVID testing 80% on RA here on arrival - 4L brings him to 90's Febrile here Per wife, febrile and lethargic at home Given decadron, now on 8L via [place] CXR clear - COVID +  Pending admission - waiting on call from hospitalist  12:35 - talked to Dr. Marlowe Sax, [facility], who requests CTA r/o PE. She will see and admit the patient.     Charlann Lange, PA-C 08/07/19 [id number]    Gareth Morgan, MD 08/07/19 1350

## 2019-08-07 NOTE — ED Notes (Signed)
Hospitalist at patient bedside at this time.

## 2019-08-07 NOTE — Progress Notes (Signed)
Vincent [name]  [id number] DOB: 10-Nov-1950 DOA: 08/06/2019 PCP: Leonard Downing, MD    Brief Narrative:  [phone] w/ a hx of DM2, HTN, and metastatic pancreatic CA on chemo who presented to the [facility] ED w/ weakness and a cough for 5 days. In the ED he was found to be COVID+, febrile to 101.8, and hypoxic with sats of 80% on RA requirin 8L of support to improve. A CXR was w/o clear infiltrate but CTa noted diffuse B infiltrates c/w COVID.   Significant Events: 12/31 admit via [facility] ED - transfer to [facility]  COVID-19 specific Treatment: Remdesivir 12/30 > Decadron 12/30 >  Antimicrobials:  None   Subjective: Resting comfortably in bed. No complaints at this time.   Assessment & Plan:  COVID Pneumonia - acute hypoxic resp failure  No actemra as pt on chemotherapy - cont remdesivir + decadron   Recent Labs  Lab 08/04/19 1152 08/06/19 2153  DDIMER  --  1.67*  FERRITIN  --  562*  CRP  --  16.2*  ALT 24 10  PROCALCITON  --  0.19   DM2 A1c 6.6  Metastatic prostate CA Followed by Dr. Burr Medico  HLD  DVT prophylaxis: lovenox  Code Status: DNR - NO CODE Family Communication:  Disposition Plan: PCU  Consultants:  none  Objective: Blood pressure 105/81, pulse 66, temperature 97.7 F (36.5 C), temperature source Oral, resp. rate 17, height 6\' 4"  (1.93 m), weight 105.2 kg, SpO2 96 %. No intake or output data in the 24 hours ending 08/07/19 1118 Filed Weights   08/06/19 2031  Weight: 105.2 kg    Examination: Pt seen for a f/u appointment   CBC: Recent Labs  Lab 08/04/19 1152 08/06/19 2153  WBC 14.7* 9.4  NEUTROABS 12.7* 8.5*  HGB 12.7* 12.5*  HCT 39.3 38.1*  MCV 103.7* 103.8*  PLT 122* 0000000   Basic Metabolic Panel: Recent Labs  Lab 08/04/19 1152 08/06/19 2153  NA 133* 132*  K 4.1 3.9  CL 99 97*  CO2 24 24  GLUCOSE 179* 149*  BUN 20 16  CREATININE 1.50* 1.04  CALCIUM 8.6* 8.5*   GFR: Estimated Creatinine Clearance: 93.1 mL/min (by C-G formula  based on SCr of 1.04 mg/dL).  Liver Function Tests: Recent Labs  Lab 08/04/19 1152 08/06/19 2153  AST 23 38  ALT 24 38  ALKPHOS 132* 81  BILITOT 1.1 0.9  PROT 7.3 6.9  ALBUMIN 3.8 3.5    HbA1C: Hgb A1c MFr Bld  Date/Time Value Ref Range Status  08/07/2019 01:47 AM 6.6 (H) 4.8 - 5.6 % Final    Comment:    (NOTE) Pre diabetes:          5.7%-6.4% Diabetes:              >6.4% Glycemic control for   <7.0% adults with diabetes   10/08/2017 02:55 PM 6.3 (H) 4.8 - 5.6 % Final    Comment:    (NOTE) Pre diabetes:          5.7%-6.4% Diabetes:              >6.4% Glycemic control for   <7.0% adults with diabetes     CBG: Recent Labs  Lab 08/07/19 0211 08/07/19 0755 08/07/19 0830  GLUCAP 192* 198* 206*    Recent Results (from the past 240 hour(s))  Urine culture     Status: None   Collection Time: 08/04/19 11:18 AM   Specimen: Urine, Clean Catch  Result  Value Ref Range Status   Specimen Description   Final    URINE, CLEAN CATCH Performed at [facility], [place] [address] [place], [place] [address]    Special Requests   Final    NONE Performed at [facility], [place] [address], [place], [place] [address]    Culture   Final    NO GROWTH Performed at [facility], [place] [address], [place], [place] [address]    Report Status 08/05/2019 FINAL  Final  Culture, blood (routine x 2)     Status: None (Preliminary result)   Collection Time: 08/04/19 11:32 AM   Specimen: BLOOD LEFT ARM  Result Value Ref Range Status   Specimen Description   Final    BLOOD LEFT ARM Performed at [facility], [place] [address], [place], [place] [address]    Special Requests   Final    NONE Performed at [facility], [place] [address] [place], [place] [address]    Culture   Final    NO GROWTH 3 DAYS Performed at [facility], [place] [address], [place], [place] [address]    Report  Status PENDING  Incomplete  Culture, Blood     Status: None (Preliminary result)   Collection Time: 08/04/19 11:52 AM   Specimen: Porta Cath; Blood  Result Value Ref Range Status   Specimen Description   Final    PORTA CATH Performed at [facility], [place] [address], [place], [place] [address]    Special Requests   Final    NONE Performed at [facility], [place] [address] [place], [place] [address]    Culture   Final    NO GROWTH 3 DAYS Performed at [facility], [place] [address], [place], [place] [address]    Report Status PENDING  Incomplete  Blood Culture (routine x 2)     Status: None (Preliminary result)   Collection Time: 08/06/19 10:07 PM   Specimen: BLOOD  Result Value Ref Range Status   Specimen Description   Final    BLOOD RIGHT ANTECUBITAL Performed at [place] [address], [place], [place] [address]    Special Requests   Final    BOTTLES DRAWN AEROBIC AND ANAEROBIC Blood Culture adequate volume Performed at [place] [address] [place], [place] [address]    Culture   Final    NO GROWTH < 12 HOURS Performed at [place] [address], [place], [place] [address]    Report Status PENDING  Incomplete  Blood Culture (routine x 2)     Status: None (Preliminary result)   Collection Time: 08/06/19 10:08 PM   Specimen: BLOOD  Result Value Ref Range Status   Specimen Description   Final    BLOOD UNKNOWN Performed at [place] [address], [place], [place] [address]    Special Requests   Final    BOTTLES DRAWN AEROBIC AND ANAEROBIC Blood Culture adequate volume Performed at [place] [address], [place], [place] [address]    Culture   Final    NO GROWTH < 12 HOURS Performed at [place] [address], [place],  [address]    Report Status PENDING  Incomplete     Scheduled  Meds: . vitamin C  500 mg Oral BID  . aspirin EC  81 mg Oral QPC breakfast  . cholecalciferol  2,000 Units Oral Daily  . dexamethasone (DECADRON) injection  6 mg Intravenous Q24H  . eltrombopag  25 mg Oral Daily  . enoxaparin (LOVENOX) injection  40 mg Subcutaneous QHS  . gabapentin  300 mg Oral QHS  . insulin aspart  0-5 Units Subcutaneous QHS  . insulin aspart  0-9 Units Subcutaneous TID WC  . simvastatin  20 mg Oral QPM  . zinc sulfate  220 mg Oral Daily   Continuous Infusions: . [START ON 08/08/2019] remdesivir 100 mg in NS 100 mL       LOS: 0 days   Cherene Altes, MD [facility] Office  [phone] Pager - Text Page per Amion  If 7PM-7AM, please contact night-coverage per Amion 08/07/2019, 11:18 AM

## 2019-08-07 NOTE — ED Notes (Signed)
ED TO INPATIENT HANDOFF REPORT  ED Nurse Name and Phone #: Luetta Nutting [phone]  S Name/Age/Gender Vincent [name] 68 y.o. male Room/Bed: WA19/WA19  Code Status   Code Status: DNR  Home/SNF/Other Home Patient oriented to: self Is this baseline? Yes   Triage Complete: Triage complete  Chief Complaint Acute respiratory failure with hypoxia ([place]) [J96.01]  Triage Note Per EMS, patient coming from pulmonologist, sent to pulmonology by oncology for Covid test. Patient denies SOB. 94% 3L [place]. C/o generalized weakness and cough x12 days. Hx pancreatic cancer. Last treatment 12/21.     Allergies No Known Allergies  Level of Care/Admitting Diagnosis ED Disposition    ED Disposition Condition [facility] Area: [place] [[id number]]  Level of Care: Progressive [102]  Covid Evaluation: Confirmed COVID Positive  Diagnosis: Acute respiratory failure with hypoxia [facility]) [[id number]]  Admitting Physician: Shela Leff [[id number]]  Attending Physician: Shela Leff [[id number]]  Estimated length of stay: past midnight tomorrow  Certification:: I certify this patient will need inpatient services for at least 2 midnights       B Medical/Surgery History Past Medical History:  Diagnosis Date  . Diabetes mellitus without complication ([place])    pt. states that he is not diabeticalthought he takes metformin  . Eye hemorrhage    right  . Family history of lung cancer   . Hypertension   . Monoallelic mutation of BRIP1 gene   . Monoallelic mutation of MUTYH gene    Past Surgical History:  Procedure Laterality Date  . ESOPHAGOGASTRODUODENOSCOPY (EGD) WITH PROPOFOL N/A 10/29/2018   Procedure: ESOPHAGOGASTRODUODENOSCOPY (EGD) WITH PROPOFOL;  Surgeon: Arta Silence, MD;  Location: [facility];  Service: Endoscopy;  Laterality: N/A;  . EUS N/A 10/29/2018   Procedure: FULL UPPER ENDOSCOPIC ULTRASOUND (EUS) RADIAL;  Surgeon: Arta Silence, MD;  Location:  [facility];  Service: Endoscopy;  Laterality: N/A;  . FINE NEEDLE ASPIRATION N/A 10/29/2018   Procedure: FINE NEEDLE ASPIRATION (FNA) LINEAR;  Surgeon: Arta Silence, MD;  Location: [facility];  Service: Endoscopy;  Laterality: N/A;  . IR IMAGING GUIDED PORT INSERTION  11/11/2018  . LUMBAR LAMINECTOMY/DECOMPRESSION MICRODISCECTOMY Left 10/11/2017   Procedure: Microlumbar decompression Lumbar Four-Five Left;  Surgeon: Susa Day, MD;  Location: [place];  Service: Orthopedics;  Laterality: Left;  Microlumbar decompression Lumbar Four-Five Left  . removal of wisdom teeth  1977     A IV Location/Drains/Wounds Patient Lines/Drains/Airways Status   Active Line/Drains/Airways    Name:   Placement date:   Placement time:   Site:   Days:   Implanted Port 11/11/18 Right Chest   11/11/18    --    Chest   269   Peripheral IV 05/19/19 Left Wrist   05/19/19    0906    Wrist   80   Peripheral IV 08/06/19 Left Forearm   08/06/19    2209    Forearm   1   Incision (Closed) 10/11/17 Back Other (Comment)   10/11/17    0948     665   Incision (Closed) 11/11/18 Chest Right   11/11/18    1316     269          Intake/Output Last 24 hours No intake or output data in the 24 hours ending 08/07/19 0300  Labs/Imaging Results for orders placed or performed during the hospital encounter of 08/06/19 (from the past 48 hour(s))  Lactic acid, plasma     Status: None   Collection  Time: 08/06/19  9:53 PM  Result Value Ref Range   Lactic Acid, Venous 1.6 0.5 - 1.9 mmol/L    Comment: Performed at [facility], [place] [address] [place], [place] [address]  CBC WITH DIFFERENTIAL     Status: Abnormal   Collection Time: 08/06/19  9:53 PM  Result Value Ref Range   WBC 9.4 4.0 - 10.5 K/uL   RBC 3.67 (L) 4.22 - 5.81 MIL/uL   Hemoglobin 12.5 (L) 13.0 - 17.0 g/dL   HCT 38.1 (L) 39.0 - 52.0 %   MCV 103.8 (H) 80.0 - 100.0 fL   MCH 34.1 (H) 26.0 - 34.0 pg   MCHC 32.8 30.0 - 36.0 g/dL   RDW 13.4 11.5 -  15.5 %   Platelets 153 150 - 400 K/uL   nRBC 0.0 0.0 - 0.2 %   Neutrophils Relative % 90 %   Neutro Abs 8.5 (H) 1.7 - 7.7 K/uL   Lymphocytes Relative 4 %   Lymphs Abs 0.4 (L) 0.7 - 4.0 K/uL   Monocytes Relative 5 %   Monocytes Absolute 0.4 0.1 - 1.0 K/uL   Eosinophils Relative 0 %   Eosinophils Absolute 0.0 0.0 - 0.5 K/uL   Basophils Relative 0 %   Basophils Absolute 0.0 0.0 - 0.1 K/uL   Immature Granulocytes 1 %   Abs Immature Granulocytes 0.06 0.00 - 0.07 K/uL    Comment: Performed at [facility], [place] [address] [place], [place] [address]  Comprehensive metabolic panel     Status: Abnormal   Collection Time: 08/06/19  9:53 PM  Result Value Ref Range   Sodium 132 (L) 135 - 145 mmol/L   Potassium 3.9 3.5 - 5.1 mmol/L   Chloride 97 (L) 98 - 111 mmol/L   CO2 24 22 - 32 mmol/L   Glucose, Bld 149 (H) 70 - 99 mg/dL   BUN 16 8 - 23 mg/dL   Creatinine, Ser 1.04 0.61 - 1.24 mg/dL   Calcium 8.5 (L) 8.9 - 10.3 mg/dL   Total Protein 6.9 6.5 - 8.1 g/dL   Albumin 3.5 3.5 - 5.0 g/dL   AST 38 15 - 41 U/L   ALT 38 0 - 44 U/L   Alkaline Phosphatase 81 38 - 126 U/L   Total Bilirubin 0.9 0.3 - 1.2 mg/dL   GFR calc non Af Amer >60 >60 mL/min   GFR calc Af Amer >60 >60 mL/min   Anion gap 11 5 - 15    Comment: Performed at [facility], [place] [address], [place], [place] [address]  D-dimer, quantitative     Status: Abnormal   Collection Time: 08/06/19  9:53 PM  Result Value Ref Range   D-Dimer, Quant 1.67 (H) 0.00 - 0.50 ug/mL-FEU    Comment: (NOTE) At the manufacturer cut-off of 0.50 ug/mL FEU, this assay has been documented to exclude PE with a sensitivity and negative predictive value of 97 to 99%.  At this time, this assay has not been approved by the FDA to exclude DVT/VTE. Results should be correlated with clinical presentation. Performed at [facility], [place] [address] [place],  [address]   Procalcitonin     Status:  None   Collection Time: 08/06/19  9:53 PM  Result Value Ref Range   Procalcitonin 0.19 ng/mL    Comment:        Interpretation: PCT (Procalcitonin) <= 0.5 ng/mL: Systemic infection (sepsis) is not likely. Local bacterial infection is  possible. (NOTE)       Sepsis PCT Algorithm           Lower Respiratory Tract                                      Infection PCT Algorithm    ----------------------------     ----------------------------         PCT < 0.25 ng/mL                PCT < 0.10 ng/mL         Strongly encourage             Strongly discourage   discontinuation of antibiotics    initiation of antibiotics    ----------------------------     -----------------------------       PCT 0.25 - 0.50 ng/mL            PCT 0.10 - 0.25 ng/mL               OR       >80% decrease in PCT            Discourage initiation of                                            antibiotics      Encourage discontinuation           of antibiotics    ----------------------------     -----------------------------         PCT >= 0.50 ng/mL              PCT 0.26 - 0.50 ng/mL               AND        <80% decrease in PCT             Encourage initiation of                                             antibiotics       Encourage continuation           of antibiotics    ----------------------------     -----------------------------        PCT >= 0.50 ng/mL                  PCT > 0.50 ng/mL               AND         increase in PCT                  Strongly encourage                                      initiation of antibiotics    Strongly encourage escalation           of antibiotics                                     -----------------------------  PCT <= 0.25 ng/mL                                                 OR                                        > 80% decrease in PCT                                     Discontinue / Do not initiate                                              antibiotics Performed at [place] [address], [place], [place] [address]   Lactate dehydrogenase     Status: Abnormal   Collection Time: 08/06/19  9:53 PM  Result Value Ref Range   LDH 248 (H) 98 - 192 U/L    Comment: Performed at [facility], [place] [address] [place], [place] [address]  Ferritin     Status: Abnormal   Collection Time: 08/06/19  9:53 PM  Result Value Ref Range   Ferritin 562 (H) 24 - 336 ng/mL    Comment: Performed at [facility], [place] [address] [place], [place] [address]  Fibrinogen     Status: Abnormal   Collection Time: 08/06/19  9:53 PM  Result Value Ref Range   Fibrinogen 589 (H) 210 - 475 mg/dL    Comment: Performed at [facility], [place] [address] [place], [place] [address]  C-reactive protein     Status: Abnormal   Collection Time: 08/06/19  9:53 PM  Result Value Ref Range   CRP 16.2 (H) <1.0 mg/dL    Comment: Performed at [facility], [place] [address], [place], [place] [address]  Troponin I (High Sensitivity)     Status: None   Collection Time: 08/06/19  9:53 PM  Result Value Ref Range   Troponin I (High Sensitivity) 6 <18 ng/L    Comment: (NOTE) Elevated high sensitivity troponin I (hsTnI) values and significant  changes across serial measurements may suggest ACS but many other  chronic and acute conditions are known to elevate hsTnI results.  Refer to the "Links" section for chest pain algorithms and additional  guidance. Performed at [facility], [place] [address], [place], [place] [address]   Triglycerides     Status: None   Collection Time: 08/06/19  9:54 PM  Result Value Ref Range   Triglycerides 77 <150 mg/dL    Comment: Performed at [facility], [place] [address] [place], [place] [address]  POC SARS Coronavirus 2 Ag-ED - Nasal Swab (BD Veritor Kit)     Status: Abnormal   Collection  Time: 08/06/19 10:35 PM  Result Value Ref Range   SARS Coronavirus 2 Ag POSITIVE (A) NEGATIVE    Comment: (NOTE) SARS-CoV-2 antigen PRESENT. Positive results indicate the presence of viral antigens, but clinical correlation with patient history and other diagnostic information is necessary to determine patient infection status.  Positive results do not rule out bacterial infection or co-infection  with other viruses. False positive results are rare but can occur, and confirmatory RT-PCR testing may be appropriate in some circumstances. The expected result is Negative. Fact Sheet for Patients: PodPark.tn Fact Sheet for Providers: GiftContent.is  This test is not yet approved or cleared by the [place] FDA and  has been authorized for detection and/or diagnosis of SARS-CoV-2 by FDA under an Emergency Use Authorization (EUA).  This EUA will remain in effect (meaning this test can be used) for the duration of  the COVID-19 declaration under Section 564(b)(1) of the Act, 21 U.S.C. section 360bbb-3(b)(1), unless the a uthorization is terminated or revoked sooner.   Troponin I (High Sensitivity)     Status: None   Collection Time: 08/06/19 11:28 PM  Result Value Ref Range   Troponin I (High Sensitivity) 6 <18 ng/L    Comment: (NOTE) Elevated high sensitivity troponin I (hsTnI) values and significant  changes across serial measurements may suggest ACS but many other  chronic and acute conditions are known to elevate hsTnI results.  Refer to the "Links" section for chest pain algorithms and additional  guidance. Performed at [facility], [place] [address], [place], [place] [address]   Lactate dehydrogenase     Status: Abnormal   Collection Time: 08/07/19  1:49 AM  Result Value Ref Range   LDH 252 (H) 98 - 192 U/L    Comment: Performed at [facility], [place] [address] [place], [place]  [address]  CBG monitoring, ED     Status: Abnormal   Collection Time: 08/07/19  2:11 AM  Result Value Ref Range   Glucose-Capillary 192 (H) 70 - 99 mg/dL   DG Chest Portable 1 View  Result Date: 08/06/2019 CLINICAL DATA:  Shortness of breath, pancreatic cancer EXAM: PORTABLE CHEST 1 VIEW COMPARISON:  08/31/2017 FINDINGS: There is a right-sided Port-A-Cath with the tip projecting over the SVC. There is no focal consolidation. There is no pleural effusion or pneumothorax. The heart and mediastinal contours are unremarkable. There is no acute osseous abnormality. IMPRESSION: No active disease. Electronically Signed   By: Kathreen Devoid   On: 08/06/2019 22:01    Pending Labs Unresulted Labs (From admission, onward)    Start     Ordered   08/08/19 0500  CBC with Differential/Platelet  Daily,   R     08/07/19 0151   08/08/19 0500  Comprehensive metabolic panel  Daily,   R     08/07/19 0151   08/08/19 0500  C-reactive protein  Daily,   R     08/07/19 0151   08/08/19 0500  D-dimer, quantitative (not at [facility])  Daily,   R     08/07/19 0151   08/07/19 0500  Lactate dehydrogenase  Daily,   R     08/07/19 0151   08/07/19 0147  Hemoglobin A1c  Once,   STAT    Comments: To assess prior glycemic control    08/07/19 0151   08/07/19 0147  HIV Antibody (routine testing w rflx)  (HIV Antibody (Routine testing w reflex) panel)  Once,   STAT     08/07/19 0151   08/07/19 0147  ABO/Rh  Once,   STAT     08/07/19 0151   08/06/19 2128  Blood Culture (routine x 2)  BLOOD CULTURE X 2,   STAT     08/06/19 2127  Vitals/Pain Today's Vitals   08/07/19 0130 08/07/19 0200 08/07/19 0230 08/07/19 0259  BP: 102/68 101/70 105/74   Pulse: 85 86 84   Resp: (!) 28 (!) 26 (!) 24   Temp:      TempSrc:      SpO2: 97% 95% 98%   Weight:      Height:      PainSc:    0-No pain    Isolation Precautions Airborne and Contact precautions  Medications Medications  sodium chloride (PF) 0.9 % injection (   Canceled Entry 08/07/19 0233)  aspirin EC tablet 81 mg (has no administration in time range)  traMADol (ULTRAM) tablet 100 mg (has no administration in time range)  simvastatin (ZOCOR) tablet 20 mg (has no administration in time range)  gabapentin (NEURONTIN) capsule 300 mg (300 mg Oral Given 08/07/19 0244)  eltrombopag (PROMACTA) tablet 25 mg (has no administration in time range)  insulin aspart (novoLOG) injection 0-9 Units (has no administration in time range)  insulin aspart (novoLOG) injection 0-5 Units (0 Units Subcutaneous Not Given 08/07/19 0233)  enoxaparin (LOVENOX) injection 40 mg (40 mg Subcutaneous Given 08/07/19 0243)  remdesivir 200 mg in sodium chloride 0.9% 250 mL IVPB (200 mg Intravenous New Bag/Given 08/07/19 0248)    Followed by  remdesivir 100 mg in sodium chloride 0.9 % 100 mL IVPB (has no administration in time range)  albuterol (VENTOLIN HFA) 108 (90 Base) MCG/ACT inhaler 2 puff (has no administration in time range)  dexamethasone (DECADRON) injection 6 mg (has no administration in time range)  guaiFENesin-dextromethorphan (ROBITUSSIN DM) 100-10 MG/5ML syrup 10 mL (has no administration in time range)  chlorpheniramine-HYDROcodone (TUSSIONEX) 10-8 MG/5ML suspension 5 mL (has no administration in time range)  ascorbic acid (VITAMIN C) tablet 500 mg (500 mg Oral Given 08/07/19 0244)  zinc sulfate capsule 220 mg (has no administration in time range)  cholecalciferol (VITAMIN D3) tablet 2,000 Units (has no administration in time range)  acetaminophen (TYLENOL) tablet 650 mg (has no administration in time range)  dexamethasone (DECADRON) injection 10 mg (10 mg Intravenous Given 08/06/19 2308)  acetaminophen (TYLENOL) tablet 1,000 mg (1,000 mg Oral Given 08/07/19 0041)  iohexol (OMNIPAQUE) 350 MG/ML injection 100 mL (100 mLs Intravenous Contrast Given 08/07/19 0218)    Mobility walks Low fall risk   Focused Assessments Pulmonary Assessment Handoff:  Lung sounds:    O2 Device: High Flow Nasal Cannula(placed on hfnc per md's request) O2 Flow Rate (L/min): 10 L/min      R Recommendations: See Admitting Provider Note  Report given to:   Additional Notes: 8 L

## 2019-08-07 NOTE — Progress Notes (Signed)
Pharmacy: Remdesivir   Patient is a 68 y.o. male with COVID.  Pharmacy has been consulted for remdesivir dosing.   - CXR shows "No active disease." -Pt requiring supplemental oxygen (Yes, HFNC) -ALT 38     A/P:  - Patient meets criteria for remdesivir. Will initiate remdesivir 200 mg once followed by 100 mg daily x 4 days.  - Daily CMET while on remdesivir - Will f/u pt's ALT and clinical condition

## 2019-08-07 NOTE — Progress Notes (Signed)
Patient's wife Almyra Free called and updated.

## 2019-08-07 NOTE — ED Notes (Signed)
[facility] called to request transportation. Patient will be on non-rebreather for transport

## 2019-08-08 DIAGNOSIS — E119 Type 2 diabetes mellitus without complications: Secondary | ICD-10-CM

## 2019-08-08 DIAGNOSIS — E7800 Pure hypercholesterolemia, unspecified: Secondary | ICD-10-CM

## 2019-08-08 LAB — CBC WITH DIFFERENTIAL/PLATELET
Abs Immature Granulocytes: 0.01 10*3/uL (ref 0.00–0.07)
Basophils Absolute: 0 10*3/uL (ref 0.0–0.1)
Basophils Relative: 1 %
Eosinophils Absolute: 0 10*3/uL (ref 0.0–0.5)
Eosinophils Relative: 0 %
HCT: 37.8 % — ABNORMAL LOW (ref 39.0–52.0)
Hemoglobin: 12.2 g/dL — ABNORMAL LOW (ref 13.0–17.0)
Immature Granulocytes: 0 %
Lymphocytes Relative: 7 %
Lymphs Abs: 0.5 10*3/uL — ABNORMAL LOW (ref 0.7–4.0)
MCH: 33 pg (ref 26.0–34.0)
MCHC: 32.3 g/dL (ref 30.0–36.0)
MCV: 102.2 fL — ABNORMAL HIGH (ref 80.0–100.0)
Monocytes Absolute: 0.4 10*3/uL (ref 0.1–1.0)
Monocytes Relative: 7 %
Neutro Abs: 5.5 10*3/uL (ref 1.7–7.7)
Neutrophils Relative %: 85 %
Platelets: 208 10*3/uL (ref 150–400)
RBC: 3.7 MIL/uL — ABNORMAL LOW (ref 4.22–5.81)
RDW: 13.4 % (ref 11.5–15.5)
WBC: 6.4 10*3/uL (ref 4.0–10.5)
nRBC: 0 % (ref 0.0–0.2)

## 2019-08-08 LAB — COMPREHENSIVE METABOLIC PANEL
ALT: 42 U/L (ref 0–44)
AST: 37 U/L (ref 15–41)
Albumin: 3.3 g/dL — ABNORMAL LOW (ref 3.5–5.0)
Alkaline Phosphatase: 80 U/L (ref 38–126)
Anion gap: 12 (ref 5–15)
BUN: 28 mg/dL — ABNORMAL HIGH (ref 8–23)
CO2: 23 mmol/L (ref 22–32)
Calcium: 8.8 mg/dL — ABNORMAL LOW (ref 8.9–10.3)
Chloride: 103 mmol/L (ref 98–111)
Creatinine, Ser: 1.24 mg/dL (ref 0.61–1.24)
GFR calc Af Amer: >60 mL/min (ref >60)
GFR calc non Af Amer: >60 mL/min (ref >60)
Glucose, Bld: 193 mg/dL — ABNORMAL HIGH (ref 70–99)
Potassium: 3.7 mmol/L (ref 3.5–5.1)
Sodium: 138 mmol/L (ref 135–145)
Total Bilirubin: 0.6 mg/dL (ref 0.3–1.2)
Total Protein: 7.1 g/dL (ref 6.5–8.1)

## 2019-08-08 LAB — GLUCOSE, CAPILLARY
Glucose-Capillary: 167 mg/dL — ABNORMAL HIGH (ref 70–99)
Glucose-Capillary: 190 mg/dL — ABNORMAL HIGH (ref 70–99)
Glucose-Capillary: 180 mg/dL — ABNORMAL HIGH (ref 70–99)
Glucose-Capillary: 156 mg/dL — ABNORMAL HIGH (ref 70–99)

## 2019-08-08 LAB — MAGNESIUM: Magnesium: 2.2 mg/dL (ref 1.7–2.4)

## 2019-08-08 LAB — PHOSPHORUS: Phosphorus: 2.4 mg/dL — ABNORMAL LOW (ref 2.5–4.6)

## 2019-08-08 LAB — C-REACTIVE PROTEIN: CRP: 12.1 mg/dL — ABNORMAL HIGH (ref <1.0)

## 2019-08-08 LAB — FERRITIN: Ferritin: 591 ng/mL — ABNORMAL HIGH (ref 24–336)

## 2019-08-08 MED ORDER — CHLORHEXIDINE GLUCONATE CLOTH 2 % EX PADS
6.0000 | MEDICATED_PAD | Freq: Every day | CUTANEOUS | Status: DC
  Administered 2019-08-08 – 2019-09-02 (×22): 6 via TOPICAL

## 2019-08-08 NOTE — Progress Notes (Signed)
PROGRESS NOTE    Vincent [name]  [id number] DOB: May 28, 1951 DOA: 08/06/2019 PCP: Leonard Downing, MD   Brief Narrative:  [phone] WM PMHx diabetes type 2 controlled with complication, HTN, and metastatic pancreatic CA on chemo   Presented to the [facility] ED w/ weakness and a cough for 5 days. In the ED he was found to be COVID+, febrile to 101.8, and hypoxic with sats of 80% on RA requirin 8L of support to improve. A CXR was w/o clear infiltrate but CTa noted diffuse B infiltrates c/w Covid   Subjective: A/O x4, stated wife has been tested but is waiting for results.  Unsure of how he is exposed.   Assessment & Plan:   Principal Problem:   Pneumonia due to COVID-19 virus Active Problems:   Pancreatic cancer ([place])   Acute respiratory failure with hypoxia ([place])   Type 2 diabetes mellitus ([place])   HLD (hyperlipidemia)   Covid pneumonia/acute respiratory failure with hypoxia COVID-19 Labs  Recent Labs    08/06/19 2153 08/07/19 0149 08/08/19 0937  DDIMER 1.67*  --   --   FERRITIN 562*  --  591*  LDH 248* 252*  --   CRP 16.2*  --  12.1*    12/30 POC SARS coronavirus positive  -NO Actemra as pt on chemotherapy  -Decadron 6 mg daily -Remdesivir per pharmacy protocol -Counseled patient concerning Covid convalescent plasma explained not proven therapy by randomized control trial.  Counseled that CCP is considered a treatment option for patients with severe disease.  Patient has agreed that we may use ECP if required -Vitamins per Covid protocol -Prone patient 16 hours/day.  If patient cannot tolerate prone 2 to 3 hours per shift -Titrate O2 to maintain SPO2> 88%  Diabetes type 2 controlled with complication -[id number] hemoglobin A1c= 6.6 -Sensitive SSI  HLD -Simvastatin 20 mg daily -Lipid panel pending  Metastatic prostate cancer -Followed by Dr. Burr Medico          DVT prophylaxis: Lovenox Code Status: DNR Family Communication:  Disposition Plan: TBD    Consultants:    Procedures/Significant Events:  12/31 admit via [facility] ED - [facility]    I have personally reviewed and interpreted all radiology studies and my findings are as above.  VENTILATOR SETTINGS:    Cultures   Antimicrobials: Anti-infectives (From admission, onward)   Start     Dose/Rate Stop   08/08/19 1000  remdesivir 100 mg in sodium chloride 0.9 % 100 mL IVPB     100 mg 200 mL/hr over 30 Minutes 08/12/19 0959   08/07/19 0200  remdesivir 200 mg in sodium chloride 0.9% 250 mL IVPB     200 mg 580 mL/hr over 30 Minutes 08/07/19 0341       Devices    LINES / TUBES:      Continuous Infusions: . remdesivir 100 mg in NS 100 mL       Objective: Vitals:   08/08/19 0430 08/08/19 0435 08/08/19 0443 08/08/19 0724  BP:  94/66    Pulse:  98    Resp:  (!) 28    Temp:    (!) 101.7 F (38.7 C)  TempSrc:    Oral  SpO2: 97% (!) 85% (!) 89%   Weight:      Height:        Intake/Output Summary (Last 24 hours) at 08/08/2019 0749 Last data filed at 08/08/2019 0400 Gross per 24 hour  Intake 480 ml  Output 620 ml  Net -  140 ml   Filed Weights   08/06/19 2031  Weight: 105.2 kg    Examination:  General: A/O x4, positive acute respiratory distress Eyes: negative scleral hemorrhage, negative anisocoria, negative icterus ENT: Negative Runny nose, negative gingival bleeding, Neck:  Negative scars, masses, torticollis, lymphadenopathy, JVD Lungs: Clear to auscultation bilaterally without wheezes or crackles Cardiovascular: Regular rate and rhythm without murmur gallop or rub normal S1 and S2 Abdomen: negative abdominal pain, nondistended, positive soft, bowel sounds, no rebound, no ascites, no appreciable mass Extremities: No significant cyanosis, clubbing, or edema bilateral lower extremities Skin: Negative rashes, lesions, ulcers Psychiatric:  Negative depression, negative anxiety, negative fatigue, negative mania  Central nervous system:  Cranial nerves  II through XII intact, tongue/uvula midline, all extremities muscle strength 5/5, sensation intact throughout, negative dysarthria, negative expressive aphasia, negative receptive aphasia.  .     Data Reviewed: Care during the described time interval was provided by me .  I have reviewed this patient's available data, including medical history, events of note, physical examination, and all test results as part of my evaluation.   CBC: Recent Labs  Lab 08/04/19 1152 08/06/19 2153  WBC 14.7* 9.4  NEUTROABS 12.7* 8.5*  HGB 12.7* 12.5*  HCT 39.3 38.1*  MCV 103.7* 103.8*  PLT 122* 0000000   Basic Metabolic Panel: Recent Labs  Lab 08/04/19 1152 08/06/19 2153  NA 133* 132*  K 4.1 3.9  CL 99 97*  CO2 24 24  GLUCOSE 179* 149*  BUN 20 16  CREATININE 1.50* 1.04  CALCIUM 8.6* 8.5*   GFR: Estimated Creatinine Clearance: 93.1 mL/min (by C-G formula based on SCr of 1.04 mg/dL). Liver Function Tests: Recent Labs  Lab 08/04/19 1152 08/06/19 2153  AST 23 38  ALT 24 38  ALKPHOS 132* 81  BILITOT 1.1 0.9  PROT 7.3 6.9  ALBUMIN 3.8 3.5   No results for input(s): LIPASE, AMYLASE in the last 168 hours. No results for input(s): AMMONIA in the last 168 hours. Coagulation Profile: No results for input(s): INR, PROTIME in the last 168 hours. Cardiac Enzymes: No results for input(s): CKTOTAL, CKMB, CKMBINDEX, TROPONINI in the last 168 hours. BNP (last 3 results) No results for input(s): PROBNP in the last 8760 hours. HbA1C: Recent Labs    08/07/19 0147  HGBA1C 6.6*   CBG: Recent Labs  Lab 08/07/19 0830 08/07/19 1247 08/07/19 1551 08/07/19 2030 08/08/19 0726  GLUCAP 206* 182* 174* 191* 167*   Lipid Profile: Recent Labs    08/06/19 2154  TRIG 77   Thyroid Function Tests: No results for input(s): TSH, T4TOTAL, FREET4, T3FREE, THYROIDAB in the last 72 hours. Anemia Panel: Recent Labs    08/06/19 2153  FERRITIN 562*   Urine analysis:    Component Value Date/Time    COLORURINE YELLOW 08/04/2019 [place] 08/04/2019 1119   LABSPEC 1.021 08/04/2019 1119   PHURINE 5.0 08/04/2019 1119   GLUCOSEU NEGATIVE 08/04/2019 1119   HGBUR NEGATIVE 08/04/2019 1119   BILIRUBINUR NEGATIVE 08/04/2019 1119   KETONESUR 5 (A) 08/04/2019 1119   PROTEINUR 30 (A) 08/04/2019 1119   NITRITE NEGATIVE 08/04/2019 1119   LEUKOCYTESUR NEGATIVE 08/04/2019 1119   Sepsis Labs: @LABRCNTIP (procalcitonin:4,lacticidven:4)  ) Recent Results (from the past 240 hour(s))  Urine culture     Status: None   Collection Time: 08/04/19 11:18 AM   Specimen: Urine, Clean Catch  Result Value Ref Range Status   Specimen Description   Final    URINE, CLEAN CATCH Performed at  [facility], [place] [address] [place], [place] [address]    Special Requests   Final    NONE Performed at [facility], [place] [address], [place], [place] [address]    Culture   Final    NO GROWTH Performed at [facility], [place] [address] [place], [place] [address]    Report Status 08/05/2019 FINAL  Final  Culture, blood (routine x 2)     Status: None (Preliminary result)   Collection Time: 08/04/19 11:32 AM   Specimen: BLOOD LEFT ARM  Result Value Ref Range Status   Specimen Description   Final    BLOOD LEFT ARM Performed at [facility], [place] [address] [place], [place] [address]    Special Requests   Final    NONE Performed at [facility], [place] [address] [place], [place] [address]    Culture   Final    NO GROWTH 3 DAYS Performed at [facility], [place] [address], [place], [place] [address]    Report Status PENDING  Incomplete  Culture, Blood     Status: None (Preliminary result)   Collection Time: 08/04/19 11:52 AM   Specimen: Porta Cath; Blood  Result Value Ref Range Status   Specimen Description   Final    PORTA CATH Performed at [facility], [place]  [address], [place], [place] [address]    Special Requests   Final    NONE Performed at [facility], [place] [address], [place], [place] [address]    Culture   Final    NO GROWTH 3 DAYS Performed at [facility], [place] [address], [place], [place] [address]    Report Status PENDING  Incomplete  Blood Culture (routine x 2)     Status: None (Preliminary result)   Collection Time: 08/06/19 10:07 PM   Specimen: BLOOD  Result Value Ref Range Status   Specimen Description   Final    BLOOD RIGHT ANTECUBITAL Performed at [place] [address], [place], [place] [address]    Special Requests   Final    BOTTLES DRAWN AEROBIC AND ANAEROBIC Blood Culture adequate volume Performed at [facility] [address], [place], [place] [address]    Culture   Final    NO GROWTH < 12 HOURS Performed at [place] [address], [place], [place] [address]    Report Status PENDING  Incomplete  Blood Culture (routine x 2)     Status: None (Preliminary result)   Collection Time: 08/06/19 10:08 PM   Specimen: BLOOD  Result Value Ref Range Status   Specimen Description   Final    BLOOD UNKNOWN Performed at [facility] [address] [place], [place] [address]    Special Requests   Final    BOTTLES DRAWN AEROBIC AND ANAEROBIC Blood Culture adequate volume Performed at [facility] [facility] [address], [place], [place] [address]    Culture   Final    NO GROWTH < 12 HOURS Performed at [facility] [address] [place], [place] [address]    Report Status PENDING  Incomplete         Radiology Studies: CT Angio Chest PE W and/or Wo Contrast  Result Date: 08/07/2019 CLINICAL DATA:  Hypoxemia. Cough and weakness. History of pancreatic cancer with ongoing chemotherapy. COVID-19 positive.  EXAM: CT ANGIOGRAPHY CHEST WITH CONTRAST TECHNIQUE: Multidetector CT imaging of the  chest was performed using the standard protocol during bolus administration of intravenous contrast. Multiplanar CT image reconstructions and MIPs were obtained to evaluate the vascular anatomy. CONTRAST:  151mL OMNIPAQUE IOHEXOL 350 MG/ML SOLN COMPARISON:  Radiograph yesterday. Chest CT 11/27/2018 FINDINGS: Cardiovascular: There are no filling defects within the pulmonary arteries to suggest pulmonary embolus. Subsegmental branches are not well assessed due to contrast bolus timing, as well as breathing motion artifact through the lower lobes. Right chest port with tip in the SVC. Mild aortic tortuosity without acute abnormality. No aortic aneurysm. Borderline cardiomegaly. Coronary artery calcifications. Mediastinum/Nodes: No enlarged mediastinal or hilar lymph nodes. No visualized thyroid nodule. No esophageal wall thickening. Lungs/Pleura: Patchy heterogeneous ground-glass opacities throughout all lobes of both lungs, moderate to extensive parenchymal involvement. No septal thickening or pulmonary edema. No pleural fluid. Upper Abdomen: Low-density in the left lobe of the liver, patient with known metastatic disease, not well characterized on the current exam due to phase of contrast. Musculoskeletal: Multilevel degenerative change in the spine. No focal bone lesion. Review of the MIP images confirms the above findings. IMPRESSION: 1. No pulmonary embolus. 2. Patchy heterogeneous ground-glass opacities throughout all lobes of both lungs, consistent with pneumonia, pattern typical of COVID-19. There is moderate to extensive parenchymal involvement. 3. Borderline cardiomegaly. Coronary artery calcifications. Aortic Atherosclerosis (ICD10-I70.0). Electronically Signed   By: Keith Rake M.D.   On: 08/07/2019 03:11   DG Chest Portable 1 View  Result Date: 08/06/2019 CLINICAL DATA:  Shortness of breath, pancreatic cancer EXAM: PORTABLE CHEST 1 VIEW COMPARISON:  08/31/2017 FINDINGS: There is a right-sided  Port-A-Cath with the tip projecting over the SVC. There is no focal consolidation. There is no pleural effusion or pneumothorax. The heart and mediastinal contours are unremarkable. There is no acute osseous abnormality. IMPRESSION: No active disease. Electronically Signed   By: Kathreen Devoid   On: 08/06/2019 22:01        Scheduled Meds: . vitamin C  500 mg Oral BID  . aspirin EC  81 mg Oral QPC breakfast  . cholecalciferol  2,000 Units Oral Daily  . dexamethasone (DECADRON) injection  6 mg Intravenous Q24H  . eltrombopag  25 mg Oral Daily  . enoxaparin (LOVENOX) injection  40 mg Subcutaneous QHS  . gabapentin  300 mg Oral QHS  . insulin aspart  0-5 Units Subcutaneous QHS  . insulin aspart  0-9 Units Subcutaneous TID WC  . simvastatin  20 mg Oral QPM  . zinc sulfate  220 mg Oral Daily   Continuous Infusions: . remdesivir 100 mg in NS 100 mL       LOS: 1 day   The patient is critically ill with multiple organ systems failure and requires high complexity decision making for assessment and support, frequent evaluation and titration of therapies, application of advanced monitoring technologies and extensive interpretation of multiple databases. Critical Care Time devoted to patient care services described in this note  Time spent: 40 minutes     Maekayla Giorgio, Geraldo Docker, MD [facility] Pager [phone]  If 7PM-7AM, please contact night-coverage [email or web address] Password [facility] 08/08/2019, 7:49 AM

## 2019-08-08 NOTE — Progress Notes (Signed)
Patient has proned self for small increments throughout the night, and is repositioning self side to side.

## 2019-08-08 NOTE — Progress Notes (Signed)
Mr. Vincent [name] has had a progressive day. He was laying in the bed earlier this am, his temp was low grade and he was not moving much. I encouraged him to get up and move to either the chair or take a small walk in the room. I also instructed him on performing breathing exercises for the lungs to which he was able to accomplish. We established an agreed upon routine to take deep breaths and blow against pursed lips while counting the expiration length on his fingers. He can currently get to a 2 finger count and will perform these exercises every hour for the day with a goal of getting to 3-4 fingers for expiration. His spo2 increased from 87% on 6l HFNC to 92% after one attempt of this exercise.   We also performed IS exercises and he was able to achieve 531ml this am and 769ml this afternoon. Mr. Vincent [name] has moved to the chair from the bed after we had a discussion on not moving or exercising. I prayed with Mr. Vincent [name] and he was receptive of the encouragement/prayer. He is still up in the chair, he is motivated to return to home, and he is taking in personal drinks from home to achieve his hydration levels. We discussed protein intake and he verbalized understanding of eating protein products from his tray. Mr Vincent [name] has a scheduled medication which is not formulary and he dose not have his dose of medication here at the hospital. Dr. Sherral Hammers was informed along with pharmacy to discuss options.   I will continue to encourage Mr. Vincent [name] throughout the rest of our day.

## 2019-08-08 NOTE — Progress Notes (Signed)
Explained to patient what proning is and why we are encouraging patients to prone. Patient declining to prone at this time, encouraged patient to turn side to side if he doesn't feel able to prone. Patient states he sleeps on his side at home and he will make sure to turn side to side and will try and prone at some point during the night.

## 2019-08-09 LAB — CBC WITH DIFFERENTIAL/PLATELET
Abs Immature Granulocytes: 0.02 10*3/uL (ref 0.00–0.07)
Basophils Absolute: 0 10*3/uL (ref 0.0–0.1)
Basophils Relative: 1 %
Eosinophils Absolute: 0 10*3/uL (ref 0.0–0.5)
Eosinophils Relative: 0 %
HCT: 34.1 % — ABNORMAL LOW (ref 39.0–52.0)
Hemoglobin: 11 g/dL — ABNORMAL LOW (ref 13.0–17.0)
Immature Granulocytes: 0 %
Lymphocytes Relative: 11 %
Lymphs Abs: 0.5 10*3/uL — ABNORMAL LOW (ref 0.7–4.0)
MCH: 33.1 pg (ref 26.0–34.0)
MCHC: 32.3 g/dL (ref 30.0–36.0)
MCV: 102.7 fL — ABNORMAL HIGH (ref 80.0–100.0)
Monocytes Absolute: 0.5 10*3/uL (ref 0.1–1.0)
Monocytes Relative: 11 %
Neutro Abs: 3.6 10*3/uL (ref 1.7–7.7)
Neutrophils Relative %: 77 %
Platelets: 151 10*3/uL (ref 150–400)
RBC: 3.32 MIL/uL — ABNORMAL LOW (ref 4.22–5.81)
RDW: 13.4 % (ref 11.5–15.5)
WBC: 4.7 10*3/uL (ref 4.0–10.5)
nRBC: 0 % (ref 0.0–0.2)

## 2019-08-09 LAB — LIPID PANEL
Cholesterol: 100 mg/dL (ref 0–200)
HDL: 19 mg/dL — ABNORMAL LOW (ref >40)
LDL Cholesterol: 66 mg/dL (ref 0–99)
Total CHOL/HDL Ratio: 5.3 RATIO
Triglycerides: 75 mg/dL (ref <150)
VLDL: 15 mg/dL (ref 0–40)

## 2019-08-09 LAB — GLUCOSE, CAPILLARY
Glucose-Capillary: 137 mg/dL — ABNORMAL HIGH (ref 70–99)
Glucose-Capillary: 204 mg/dL — ABNORMAL HIGH (ref 70–99)
Glucose-Capillary: 214 mg/dL — ABNORMAL HIGH (ref 70–99)
Glucose-Capillary: 228 mg/dL — ABNORMAL HIGH (ref 70–99)
Glucose-Capillary: 212 mg/dL — ABNORMAL HIGH (ref 70–99)

## 2019-08-09 LAB — CULTURE, BLOOD (ROUTINE X 2): Culture: NO GROWTH

## 2019-08-09 LAB — CULTURE, BLOOD (SINGLE): Culture: NO GROWTH

## 2019-08-09 LAB — COMPREHENSIVE METABOLIC PANEL
ALT: 39 U/L (ref 0–44)
AST: 36 U/L (ref 15–41)
Albumin: 3.1 g/dL — ABNORMAL LOW (ref 3.5–5.0)
Alkaline Phosphatase: 72 U/L (ref 38–126)
Anion gap: 9 (ref 5–15)
BUN: 24 mg/dL — ABNORMAL HIGH (ref 8–23)
CO2: 27 mmol/L (ref 22–32)
Calcium: 8.5 mg/dL — ABNORMAL LOW (ref 8.9–10.3)
Chloride: 103 mmol/L (ref 98–111)
Creatinine, Ser: 0.96 mg/dL (ref 0.61–1.24)
GFR calc Af Amer: >60 mL/min (ref >60)
GFR calc non Af Amer: >60 mL/min (ref >60)
Glucose, Bld: 148 mg/dL — ABNORMAL HIGH (ref 70–99)
Potassium: 3.9 mmol/L (ref 3.5–5.1)
Sodium: 139 mmol/L (ref 135–145)
Total Bilirubin: 0.5 mg/dL (ref 0.3–1.2)
Total Protein: 6.4 g/dL — ABNORMAL LOW (ref 6.5–8.1)

## 2019-08-09 LAB — D-DIMER, QUANTITATIVE: D-Dimer, Quant: 1.13 ug/mL-FEU — ABNORMAL HIGH (ref 0.00–0.50)

## 2019-08-09 LAB — C-REACTIVE PROTEIN: CRP: 8.4 mg/dL — ABNORMAL HIGH (ref <1.0)

## 2019-08-09 LAB — FERRITIN: Ferritin: 526 ng/mL — ABNORMAL HIGH (ref 24–336)

## 2019-08-09 LAB — PHOSPHORUS: Phosphorus: 3.5 mg/dL (ref 2.5–4.6)

## 2019-08-09 LAB — MAGNESIUM: Magnesium: 2.3 mg/dL (ref 1.7–2.4)

## 2019-08-09 MED ORDER — INSULIN ASPART 100 UNIT/ML ~~LOC~~ SOLN
0.0000 [IU] | SUBCUTANEOUS | Status: DC
  Administered 2019-08-09: 3 [IU] via SUBCUTANEOUS
  Administered 2019-08-09: 5 [IU] via SUBCUTANEOUS
  Administered 2019-08-10: 3 [IU] via SUBCUTANEOUS
  Administered 2019-08-10: 22:00:00 2 [IU] via SUBCUTANEOUS
  Administered 2019-08-10: 5 [IU] via SUBCUTANEOUS
  Administered 2019-08-10: 2 [IU] via SUBCUTANEOUS
  Administered 2019-08-10: 3 [IU] via SUBCUTANEOUS
  Administered 2019-08-11 (×3): 2 [IU] via SUBCUTANEOUS
  Administered 2019-08-11: 11 [IU] via SUBCUTANEOUS
  Administered 2019-08-12: 09:00:00 2 [IU] via SUBCUTANEOUS
  Administered 2019-08-12: 3 [IU] via SUBCUTANEOUS
  Administered 2019-08-13: 8 [IU] via SUBCUTANEOUS
  Administered 2019-08-13: 3 [IU] via SUBCUTANEOUS
  Administered 2019-08-13 (×2): 2 [IU] via SUBCUTANEOUS
  Administered 2019-08-13: 3 [IU] via SUBCUTANEOUS
  Administered 2019-08-14 (×2): 2 [IU] via SUBCUTANEOUS
  Administered 2019-08-14: 3 [IU] via SUBCUTANEOUS
  Administered 2019-08-14: 5 [IU] via SUBCUTANEOUS
  Administered 2019-08-14 – 2019-08-15 (×3): 3 [IU] via SUBCUTANEOUS
  Administered 2019-08-15: 2 [IU] via SUBCUTANEOUS
  Administered 2019-08-15: 11 [IU] via SUBCUTANEOUS
  Administered 2019-08-15: 3 [IU] via SUBCUTANEOUS
  Administered 2019-08-16 (×3): 5 [IU] via SUBCUTANEOUS
  Administered 2019-08-16: 08:00:00 2 [IU] via SUBCUTANEOUS
  Administered 2019-08-16: 11 [IU] via SUBCUTANEOUS
  Administered 2019-08-17: 8 [IU] via SUBCUTANEOUS
  Administered 2019-08-17: 3 [IU] via SUBCUTANEOUS
  Administered 2019-08-17: 18:00:00 2 [IU] via SUBCUTANEOUS
  Administered 2019-08-18: 3 [IU] via SUBCUTANEOUS
  Administered 2019-08-18 (×2): 2 [IU] via SUBCUTANEOUS
  Administered 2019-08-18 (×2): 5 [IU] via SUBCUTANEOUS
  Administered 2019-08-18: 2 [IU] via SUBCUTANEOUS
  Administered 2019-08-19 (×4): 5 [IU] via SUBCUTANEOUS
  Administered 2019-08-19: 3 [IU] via SUBCUTANEOUS
  Administered 2019-08-19: 5 [IU] via SUBCUTANEOUS
  Administered 2019-08-20: 2 [IU] via SUBCUTANEOUS
  Administered 2019-08-20: 8 [IU] via SUBCUTANEOUS
  Administered 2019-08-20 (×2): 5 [IU] via SUBCUTANEOUS
  Administered 2019-08-20 (×2): 3 [IU] via SUBCUTANEOUS
  Administered 2019-08-21: 5 [IU] via SUBCUTANEOUS
  Administered 2019-08-21: 8 [IU] via SUBCUTANEOUS

## 2019-08-09 NOTE — Progress Notes (Addendum)
PROGRESS NOTE    Vincent [name]  [id number] DOB: 09-May-1951 DOA: 08/06/2019 PCP: Leonard Downing, MD   Brief Narrative:  [phone] WM PMHx diabetes type 2 controlled with complication, HTN, and metastatic pancreatic CA on chemo   Presented to the [facility] ED w/ weakness and a cough for 5 days. In the ED he was found to be COVID+, febrile to 101.8, and hypoxic with sats of 80% on RA requirin 8L of support to improve. A CXR was w/o clear infiltrate but CTa noted diffuse B infiltrates c/w Covid   Subjective: 1/2 afebrile last 24 hours.  Negative CP, negative abdominal pain.  Positive S OB   Assessment & Plan:   Principal Problem:   Pneumonia due to COVID-19 virus Active Problems:   Pancreatic cancer ([place])   Acute respiratory failure with hypoxia ([place])   Type 2 diabetes mellitus (H[facility])   HLD (hyperlipidemia)   Covid pneumonia/acute respiratory failure with hypoxia COVID-19 Labs  Recent Labs    08/06/19 2153 08/07/19 0149 08/08/19 0937 08/09/19 0729  DDIMER 1.67*  --   --  1.13*  FERRITIN 562*  --  591* 526*  LDH 248* 252*  --   --   CRP 16.2*  --  12.1* 8.4*    12/30 POC SARS coronavirus positive  -NO Actemra as pt on chemotherapy  -Decadron 6 mg daily -Remdesivir per pharmacy protocol -Counseled patient concerning Covid convalescent plasma explained not proven therapy by randomized control trial.  Counseled that CCP is considered a treatment option for patients with severe disease.  Patient has agreed that we may use ECP if required -Vitamins per Covid protocol -Prone patient 16 hours/day.  If patient cannot tolerate prone 2 to 3 hours per shift -Titrate O2 to maintain SPO2> 88%  Diabetes type 2 controlled with complication -[id number] hemoglobin A1c= 6.6 -1/2 increase moderate SSI  HLD -Simvastatin 20 mg daily -1/2 LDL= 66  Metastatic prostate cancer -Followed by Dr. Burr Medico          DVT prophylaxis: Lovenox Code Status: DNR Family Communication:  08/09/2019 attempted to contact Almyra Free (wife) to update her on plan of care.  Left message that I had called Disposition Plan: TBD   Consultants:    Procedures/Significant Events:  12/31 admit via [facility] ED - transfer [facility]    I have personally reviewed and interpreted all radiology studies and my findings are as above.  VENTILATOR SETTINGS: Nasal cannula 1/2 Flow; 12 L/min SPO2; 99%   Cultures 12/30 POC SARS coronavirus positive 12/31 HIV screen negative   Antimicrobials: Anti-infectives (From admission, onward)   Start     Dose/Rate Stop   08/08/19 1000  remdesivir 100 mg in sodium chloride 0.9 % 100 mL IVPB     100 mg 200 mL/hr over 30 Minutes 08/12/19 0959   08/07/19 0200  remdesivir 200 mg in sodium chloride 0.9% 250 mL IVPB     200 mg 580 mL/hr over 30 Minutes 08/07/19 0341       Devices    LINES / TUBES:      Continuous Infusions: . remdesivir 100 mg in NS 100 mL 100 mg (08/09/19 0932)     Objective: Vitals:   08/08/19 1934 08/09/19 0409 08/09/19 0415 08/09/19 0810  BP: 101/64 108/60  95/66  Pulse: 77 73 76 76  Resp: (!) 22 (!) 29 20 (!) 24  Temp: 99 F (37.2 C) 99.3 F (37.4 C)  98.4 F (36.9 C)  TempSrc: Oral Oral  Oral  SpO2: 91% 98% 93% 98%  Weight:      Height:        Intake/Output Summary (Last 24 hours) at 08/09/2019 1012 Last data filed at 08/08/2019 1937 Gross per 24 hour  Intake 620 ml  Output 400 ml  Net 220 ml   Filed Weights   08/06/19 2031  Weight: 105.2 kg   Physical Exam:  General: A/O x4, positive acute respiratory distress Eyes: negative scleral hemorrhage, negative anisocoria, negative icterus ENT: Negative Runny nose, negative gingival bleeding, Neck:  Negative scars, masses, torticollis, lymphadenopathy, JVD Lungs: Clear to auscultation bilaterally without wheezes or crackles Cardiovascular: Regular rate and rhythm without murmur gallop or rub normal S1 and S2 Abdomen: negative abdominal pain, nondistended,  positive soft, bowel sounds, no rebound, no ascites, no appreciable mass Extremities: No significant cyanosis, clubbing, or edema bilateral lower extremities Skin: Negative rashes, lesions, ulcers Psychiatric:  Negative depression, negative anxiety, negative fatigue, negative mania  Central nervous system:  Cranial nerves II through XII intact, tongue/uvula midline, all extremities muscle strength 5/5, sensation intact throughout, negative dysarthria, negative expressive aphasia, negative receptive aphasia.  .     Data Reviewed: Care during the described time interval was provided by me .  I have reviewed this patient's available data, including medical history, events of note, physical examination, and all test results as part of my evaluation.   CBC: Recent Labs  Lab 08/04/19 1152 08/06/19 2153 08/08/19 0937 08/09/19 0729  WBC 14.7* 9.4 6.4 4.7  NEUTROABS 12.7* 8.5* 5.5 3.6  HGB 12.7* 12.5* 12.2* 11.0*  HCT 39.3 38.1* 37.8* 34.1*  MCV 103.7* 103.8* 102.2* 102.7*  PLT 122* 153 208 [id number]   Basic Metabolic Panel: Recent Labs  Lab 08/04/19 1152 08/06/19 2153 08/08/19 0937 08/09/19 0729  NA 133* 132* 138 139  K 4.1 3.9 3.7 3.9  CL 99 97* 103 103  CO2 24 24 23 27   GLUCOSE 179* 149* 193* 148*  BUN 20 16 28* 24*  CREATININE 1.50* 1.04 1.24 0.96  CALCIUM 8.6* 8.5* 8.8* 8.5*  MG  --   --  2.2 2.3  PHOS  --   --  2.4* 3.5   GFR: Estimated Creatinine Clearance: 100.9 mL/min (by C-G formula based on SCr of 0.96 mg/dL). Liver Function Tests: Recent Labs  Lab 08/04/19 1152 08/06/19 2153 08/08/19 0937 08/09/19 0729  AST 23 38 37 36  ALT 24 38 42 39  ALKPHOS 132* 81 80 72  BILITOT 1.1 0.9 0.6 0.5  PROT 7.3 6.9 7.1 6.4*  ALBUMIN 3.8 3.5 3.3* 3.1*   No results for input(s): LIPASE, AMYLASE in the last 168 hours. No results for input(s): AMMONIA in the last 168 hours. Coagulation Profile: No results for input(s): INR, PROTIME in the last 168 hours. Cardiac Enzymes: No  results for input(s): CKTOTAL, CKMB, CKMBINDEX, TROPONINI in the last 168 hours. BNP (last 3 results) No results for input(s): PROBNP in the last 8760 hours. HbA1C: Recent Labs    08/07/19 0147  HGBA1C 6.6*   CBG: Recent Labs  Lab 08/08/19 0726 08/08/19 1243 08/08/19 1540 08/08/19 2120 08/09/19 0757  GLUCAP 167* 190* 180* 156* 137*   Lipid Profile: Recent Labs    08/06/19 2154 08/09/19 0729  CHOL  --  100  HDL  --  19*  LDLCALC  --  66  TRIG 77 75  CHOLHDL  --  5.3   Thyroid Function Tests: No results for input(s): TSH, T4TOTAL, FREET4, T3FREE, THYROIDAB in the last 72 hours.  Anemia Panel: Recent Labs    08/08/19 0937 08/09/19 0729  FERRITIN 591* 526*   Urine analysis:    Component Value Date/Time   COLORURINE YELLOW 08/04/2019 [place] 08/04/2019 1119   LABSPEC 1.021 08/04/2019 1119   PHURINE 5.0 08/04/2019 1119   GLUCOSEU NEGATIVE 08/04/2019 1119   HGBUR NEGATIVE 08/04/2019 1119   BILIRUBINUR NEGATIVE 08/04/2019 1119   KETONESUR 5 (A) 08/04/2019 1119   PROTEINUR 30 (A) 08/04/2019 1119   NITRITE NEGATIVE 08/04/2019 1119   LEUKOCYTESUR NEGATIVE 08/04/2019 1119   Sepsis Labs: @LABRCNTIP (procalcitonin:4,lacticidven:4)  ) Recent Results (from the past 240 hour(s))  Urine culture     Status: None   Collection Time: 08/04/19 11:18 AM   Specimen: Urine, Clean Catch  Result Value Ref Range Status   Specimen Description   Final    URINE, CLEAN CATCH Performed at [facility], [place] [address], [place], [place] [address]    Special Requests   Final    NONE Performed at [facility], [place] [address], [place], [place] [address]    Culture   Final    NO GROWTH Performed at [facility], [place] [address], [place], [place] [address]    Report Status 08/05/2019 FINAL  Final  Culture, blood (routine x 2)     Status: None   Collection Time: 08/04/19 11:32 AM   Specimen: BLOOD LEFT ARM    Result Value Ref Range Status   Specimen Description   Final    BLOOD LEFT ARM Performed at [facility], [place] [address] [place] [address] [place], [place] [address]    Special Requests   Final    NONE Performed at [facility], [place] [address] [place], [place] [address]    Culture   Final    NO GROWTH 5 DAYS Performed at [facility], [place] [address] [place], [place] [address]    Report Status 08/09/2019 FINAL  Final  Culture, Blood     Status: None   Collection Time: 08/04/19 11:52 AM   Specimen: Porta Cath; Blood  Result Value Ref Range Status   Specimen Description   Final    PORTA CATH Performed at [facility], [place] [address] [place], [place] [address]    Special Requests   Final    NONE Performed at [facility], [place] [address], [place], [place] [address]    Culture   Final    NO GROWTH 5 DAYS Performed at [facility], [place] [address], [place], [place] [address]    Report Status 08/09/2019 FINAL  Final  Blood Culture (routine x 2)     Status: None (Preliminary result)   Collection Time: 08/06/19 10:07 PM   Specimen: BLOOD  Result Value Ref Range Status   Specimen Description   Final    BLOOD RIGHT ANTECUBITAL Performed at [place] [address] [place], [place] [address]    Special Requests   Final    BOTTLES DRAWN AEROBIC AND ANAEROBIC Blood Culture adequate volume Performed at [place] [address], [place], [place] [address]    Culture   Final    NO GROWTH 2 DAYS Performed at [place] [address] [place],  [address]    Report Status PENDING  Incomplete  Blood Culture (routine x 2)     Status: None (Preliminary result)  Collection Time: 08/06/19 10:08 PM   Specimen: BLOOD  Result Value Ref Range Status   Specimen Description   Final    BLOOD UNKNOWN Performed at [place] [address] [place], [place] [address]    Special Requests   Final    BOTTLES DRAWN AEROBIC AND ANAEROBIC Blood Culture adequate volume Performed at [place] [address] [place], [place] [address]    Culture   Final    NO GROWTH 2 DAYS Performed at [place] [address] [place], [place] [address]    Report Status PENDING  Incomplete         Radiology Studies: No results found.      Scheduled Meds: . vitamin C  500 mg Oral BID  . aspirin EC  81 mg Oral QPC breakfast  . Chlorhexidine Gluconate Cloth  6 each Topical Daily  . cholecalciferol  2,000 Units Oral Daily  . dexamethasone (DECADRON) injection  6 mg Intravenous Q24H  . eltrombopag  25 mg Oral Daily  . enoxaparin (LOVENOX) injection  40 mg Subcutaneous QHS  . gabapentin  300 mg Oral QHS  . insulin aspart  0-5 Units Subcutaneous QHS  . insulin aspart  0-9 Units Subcutaneous TID WC  . simvastatin  20 mg Oral QPM  . zinc sulfate  220 mg Oral Daily   Continuous Infusions: . remdesivir 100 mg in NS 100 mL 100 mg (08/09/19 0932)     LOS: 2 days   The patient is critically ill with multiple organ systems failure and requires high complexity decision making for assessment and support, frequent evaluation and titration of therapies, application of advanced monitoring technologies and extensive interpretation of multiple databases. Critical Care Time devoted to patient care services described in this note  Time spent: 40 minutes     Tavi Gaughran, Geraldo Docker, MD [facility] Pager [phone]  If 7PM-7AM, please contact night-coverage [email or web address] Password [email or web address] 08/09/2019, 10:12 AM

## 2019-08-09 NOTE — Progress Notes (Signed)
These preliminary result these preliminary results were noted.  Awaiting final report.

## 2019-08-10 LAB — CBC WITH DIFFERENTIAL/PLATELET
Abs Immature Granulocytes: 0.03 10*3/uL (ref 0.00–0.07)
Basophils Absolute: 0.1 10*3/uL (ref 0.0–0.1)
Basophils Relative: 1 %
Eosinophils Absolute: 0 10*3/uL (ref 0.0–0.5)
Eosinophils Relative: 0 %
HCT: 36.1 % — ABNORMAL LOW (ref 39.0–52.0)
Hemoglobin: 11.5 g/dL — ABNORMAL LOW (ref 13.0–17.0)
Immature Granulocytes: 0 %
Lymphocytes Relative: 11 %
Lymphs Abs: 0.8 10*3/uL (ref 0.7–4.0)
MCH: 32.9 pg (ref 26.0–34.0)
MCHC: 31.9 g/dL (ref 30.0–36.0)
MCV: 103.1 fL — ABNORMAL HIGH (ref 80.0–100.0)
Monocytes Absolute: 0.6 10*3/uL (ref 0.1–1.0)
Monocytes Relative: 8 %
Neutro Abs: 5.7 10*3/uL (ref 1.7–7.7)
Neutrophils Relative %: 80 %
Platelets: 171 10*3/uL (ref 150–400)
RBC: 3.5 MIL/uL — ABNORMAL LOW (ref 4.22–5.81)
RDW: 13.2 % (ref 11.5–15.5)
WBC: 7.1 10*3/uL (ref 4.0–10.5)
nRBC: 0 % (ref 0.0–0.2)

## 2019-08-10 LAB — COMPREHENSIVE METABOLIC PANEL
ALT: 39 U/L (ref 0–44)
AST: 36 U/L (ref 15–41)
Albumin: 3.2 g/dL — ABNORMAL LOW (ref 3.5–5.0)
Alkaline Phosphatase: 73 U/L (ref 38–126)
Anion gap: 9 (ref 5–15)
BUN: 20 mg/dL (ref 8–23)
CO2: 28 mmol/L (ref 22–32)
Calcium: 8.5 mg/dL — ABNORMAL LOW (ref 8.9–10.3)
Chloride: 103 mmol/L (ref 98–111)
Creatinine, Ser: 0.85 mg/dL (ref 0.61–1.24)
GFR calc Af Amer: >60 mL/min (ref >60)
GFR calc non Af Amer: >60 mL/min (ref >60)
Glucose, Bld: 87 mg/dL (ref 70–99)
Potassium: 4 mmol/L (ref 3.5–5.1)
Sodium: 140 mmol/L (ref 135–145)
Total Bilirubin: 0.2 mg/dL — ABNORMAL LOW (ref 0.3–1.2)
Total Protein: 6.6 g/dL (ref 6.5–8.1)

## 2019-08-10 LAB — GLUCOSE, CAPILLARY
Glucose-Capillary: 105 mg/dL — ABNORMAL HIGH (ref 70–99)
Glucose-Capillary: 133 mg/dL — ABNORMAL HIGH (ref 70–99)
Glucose-Capillary: 139 mg/dL — ABNORMAL HIGH (ref 70–99)
Glucose-Capillary: 145 mg/dL — ABNORMAL HIGH (ref 70–99)
Glucose-Capillary: 185 mg/dL — ABNORMAL HIGH (ref 70–99)
Glucose-Capillary: 234 mg/dL — ABNORMAL HIGH (ref 70–99)
Glucose-Capillary: 98 mg/dL (ref 70–99)

## 2019-08-10 LAB — PHOSPHORUS: Phosphorus: 3.6 mg/dL (ref 2.5–4.6)

## 2019-08-10 LAB — FERRITIN: Ferritin: 523 ng/mL — ABNORMAL HIGH (ref 24–336)

## 2019-08-10 LAB — MAGNESIUM: Magnesium: 2.4 mg/dL (ref 1.7–2.4)

## 2019-08-10 LAB — D-DIMER, QUANTITATIVE: D-Dimer, Quant: 1.13 ug/mL-FEU — ABNORMAL HIGH (ref 0.00–0.50)

## 2019-08-10 LAB — C-REACTIVE PROTEIN: CRP: 8.3 mg/dL — ABNORMAL HIGH (ref <1.0)

## 2019-08-10 MED ORDER — SODIUM CHLORIDE 0.9 % IV SOLN: INTRAVENOUS | Status: DC

## 2019-08-10 MED ORDER — SODIUM CHLORIDE 0.9 % IV BOLUS
500.0000 mL | Freq: Once | INTRAVENOUS | Status: AC
  Administered 2019-08-10: 500 mL via INTRAVENOUS

## 2019-08-10 NOTE — Progress Notes (Signed)
1515- Updates given to patient's wife. Wife stated that she just spoke to the doctor today. Family denies any concerns at this time.

## 2019-08-10 NOTE — Progress Notes (Signed)
PROGRESS NOTE  Vincent [name] [id number] DOB: 18-Oct-1950 DOA: 08/06/2019 PCP: Leonard Downing, MD  HPI/Recap of past 4 hours: 69 year old male with past medical history of diabetes mellitus type 2, hypertension and metastatic pancreatic cancer, currently on chemotherapy, admitted on 12/30 with Covid and secondary hypoxia requiring 8 L of oxygen.  Patient started on IV steroids and remdesivir.  Not felt to be a candidate for Actemra given that he is on chemo.  Following admission, oxygenation peaked as high as 12 L x 1/2.  Since then, has been able to wean down back to 8 L.  Patient doing okay, comfortable on current oxygen.  He has no complaints, denies any pain.  Assessment/Plan: Principal Problem:   Pneumonia due to COVID-19 virus causing acute respiratory failure with hypoxia: Continue Remdisivir and steroids.  Not a candidate for Actemra.  Patient willing to do convalescent plasma which we will give, should breathing worsen at any point. Active Problems:   Pancreatic cancer [facility]): Followed by Dr. Burr Medico at oncology.  Wife was able to bring in home medicine of Promacta, for bone marrow stimulation.  We will start this tomorrow morning.  This may also help his oxygenation     Type 2 diabetes mellitus ([place]): A1c notes excellent control at 6.6.  CBGs overall stable.  Mild increases at times slightly above 200.  Continue to follow.    HLD (hyperlipidemia): Stable continue statin.   Code Status: DNR  Family Communication: Updated wife by phone   Disposition Plan: Home once weaned off of oxygen   Consultants:  None  Procedures:  None  Antimicrobials:  IV Remdisivir 12/30-1/3  DVT prophylaxis: Lovenox   Objective: Vitals:   08/10/19 0825 08/10/19 1256  BP: 102/71 90/63  Pulse:    Resp: 18   Temp: 99 F (37.2 C) 98.8 F (37.1 C)  SpO2:      Intake/Output Summary (Last 24 hours) at 08/10/2019 1414 Last data filed at 08/10/2019 1200 Gross per 24 hour   Intake 600 ml  Output 1200 ml  Net -600 ml   Filed Weights   08/06/19 2031  Weight: 105.2 kg   Body mass index is 28.24 kg/m.  Exam:   General: Alert and oriented x3, no acute distress  HEENT: Normocephalic and atraumatic, mucous membranes are moist  Cardiovascular: Regular rate and rhythm, S1-S2  Respiratory: Clear auscultation bilaterally  Abdomen: Soft, nontender, nondistended, positive bowel sounds  Musculoskeletal: No clubbing or cyanosis or edema  Skin: No skin breaks, tears or lesions  Neuro: No focal deficits  Psychiatry: Appropriate, no evidence of psychoses   Data Reviewed: CBC: Recent Labs  Lab 08/04/19 1152 08/06/19 2153 08/08/19 0937 08/09/19 0729 08/10/19 0149  WBC 14.7* 9.4 6.4 4.7 7.1  NEUTROABS 12.7* 8.5* 5.5 3.6 5.7  HGB 12.7* 12.5* 12.2* 11.0* 11.5*  HCT 39.3 38.1* 37.8* 34.1* 36.1*  MCV 103.7* 103.8* 102.2* 102.7* 103.1*  PLT 122* 153 208 151 [id number]   Basic Metabolic Panel: Recent Labs  Lab 08/04/19 1152 08/06/19 2153 08/08/19 0937 08/09/19 0729 08/10/19 0149  NA 133* 132* 138 139 140  K 4.1 3.9 3.7 3.9 4.0  CL 99 97* 103 103 103  CO2 24 24 23 27 28   GLUCOSE 179* 149* 193* 148* 87  BUN 20 16 28* 24* 20  CREATININE 1.50* 1.04 1.24 0.96 0.85  CALCIUM 8.6* 8.5* 8.8* 8.5* 8.5*  MG  --   --  2.2 2.3 2.4  PHOS  --   --  2.4*  3.5 3.6   GFR: Estimated Creatinine Clearance: 113.9 mL/min (by C-G formula based on SCr of 0.85 mg/dL). Liver Function Tests: Recent Labs  Lab 08/04/19 1152 08/06/19 2153 08/08/19 0937 08/09/19 0729 08/10/19 0149  AST 23 38 37 36 36  ALT 24 38 42 39 39  ALKPHOS 132* 81 80 72 73  BILITOT 1.1 0.9 0.6 0.5 0.2*  PROT 7.3 6.9 7.1 6.4* 6.6  ALBUMIN 3.8 3.5 3.3* 3.1* 3.2*   No results for input(s): LIPASE, AMYLASE in the last 168 hours. No results for input(s): AMMONIA in the last 168 hours. Coagulation Profile: No results for input(s): INR, PROTIME in the last 168 hours. Cardiac Enzymes: No results  for input(s): CKTOTAL, CKMB, CKMBINDEX, TROPONINI in the last 168 hours. BNP (last 3 results) No results for input(s): PROBNP in the last 8760 hours. HbA1C: No results for input(s): HGBA1C in the last 72 hours. CBG: Recent Labs  Lab 08/09/19 2050 08/09/19 2359 08/10/19 0509 08/10/19 0758 08/10/19 1101  GLUCAP 212* 98 105* 133* 234*   Lipid Profile: Recent Labs    08/09/19 0729  CHOL 100  HDL 19*  LDLCALC 66  TRIG 75  CHOLHDL 5.3   Thyroid Function Tests: No results for input(s): TSH, T4TOTAL, FREET4, T3FREE, THYROIDAB in the last 72 hours. Anemia Panel: Recent Labs    08/09/19 0729 08/10/19 0149  FERRITIN 526* 523*   Urine analysis:    Component Value Date/Time   COLORURINE YELLOW 08/04/2019 [place] 08/04/2019 1119   LABSPEC 1.021 08/04/2019 1119   PHURINE 5.0 08/04/2019 1119   GLUCOSEU NEGATIVE 08/04/2019 1119   HGBUR NEGATIVE 08/04/2019 1119   BILIRUBINUR NEGATIVE 08/04/2019 1119   KETONESUR 5 (A) 08/04/2019 1119   PROTEINUR 30 (A) 08/04/2019 1119   NITRITE NEGATIVE 08/04/2019 1119   LEUKOCYTESUR NEGATIVE 08/04/2019 1119   Sepsis Labs: @LABRCNTIP (procalcitonin:4,lacticidven:4)  ) Recent Results (from the past 240 hour(s))  Urine culture     Status: None   Collection Time: 08/04/19 11:18 AM   Specimen: Urine, Clean Catch  Result Value Ref Range Status   Specimen Description   Final    URINE, CLEAN CATCH Performed at [facility], [place] [address] [place], [place] [address]    Special Requests   Final    NONE Performed at [facility], [place] [address], [place], [place] [address]    Culture   Final    NO GROWTH Performed at [facility], [place] [address], [place], [place] [address]    Report Status 08/05/2019 FINAL  Final  Culture, blood (routine x 2)     Status: None   Collection Time: 08/04/19 11:32 AM   Specimen: BLOOD LEFT ARM  Result Value Ref Range Status   Specimen  Description   Final    BLOOD LEFT ARM Performed at [facility], [place] [address], [place], [place] [address]    Special Requests   Final    NONE Performed at [facility], [place] [address], [place], [place] [address]    Culture   Final    NO GROWTH 5 DAYS Performed at [facility], [place] [address] [place], [place] [address]    Report Status 08/09/2019 FINAL  Final  Culture, Blood     Status: None   Collection Time: 08/04/19 11:52 AM   Specimen: Porta Cath; Blood  Result Value Ref Range Status   Specimen Description   Final  PORTA CATH Performed at [facility], [place] [address], [place], [place] [address]    Special Requests   Final    NONE Performed at [facility], [place] [address], [place], [place] [address]    Culture   Final    NO GROWTH 5 DAYS Performed at [facility], [place] [address] [place], [place] [address]    Report Status 08/09/2019 FINAL  Final  Blood Culture (routine x 2)     Status: None (Preliminary result)   Collection Time: 08/06/19 10:07 PM   Specimen: BLOOD  Result Value Ref Range Status   Specimen Description   Final    BLOOD RIGHT ANTECUBITAL Performed at [place] [address] [place], [place] [address]    Special Requests   Final    BOTTLES DRAWN AEROBIC AND ANAEROBIC Blood Culture adequate volume Performed at [place] [address], [place], [place] [address]    Culture   Final    NO GROWTH 3 DAYS Performed at [facility], [place] [address] [place], [place] [address]    Report Status PENDING  Incomplete  Blood Culture (routine x 2)     Status: None (Preliminary result)   Collection Time: 08/06/19 10:08 PM   Specimen: BLOOD  Result Value Ref Range Status   Specimen Description   Final    BLOOD UNKNOWN Performed at [place] [address] [place],  [place] [address]    Special Requests   Final    BOTTLES DRAWN AEROBIC AND ANAEROBIC Blood Culture adequate volume Performed at [place] [address], [place], [place] [address]    Culture   Final    NO GROWTH 3 DAYS Performed at [facility], [place] [address], [place], [place] [address]    Report Status PENDING  Incomplete      Studies: No results found.  Scheduled Meds: . vitamin C  500 mg Oral BID  . aspirin EC  81 mg Oral QPC breakfast  . Chlorhexidine Gluconate Cloth  6 each Topical Daily  . cholecalciferol  2,000 Units Oral Daily  . dexamethasone (DECADRON) injection  6 mg Intravenous Q24H  . eltrombopag  25 mg Oral Daily  . enoxaparin (LOVENOX) injection  40 mg Subcutaneous QHS  . gabapentin  300 mg Oral QHS  . insulin aspart  0-15 Units Subcutaneous Q4H  . simvastatin  20 mg Oral QPM  . zinc sulfate  220 mg Oral Daily    Continuous Infusions: . remdesivir 100 mg in NS 100 mL 100 mg (08/10/19 0900)     LOS: 3 days     Annita Brod, MD [facility]  To reach me or the doctor on call, go to: [email or web address] Password [facility]  08/10/2019, 2:14 PM

## 2019-08-10 NOTE — Progress Notes (Signed)
Pharmacy Medication Storage Note  Storing home medications for Vincent [name] in pharmacy secured storage.   Medication storage bag number: [id number]  Delivered to pharmacy @ 1415 (time) by Sharee Holster (RN name)  Medications will be returned to patient/caregiver upon discharge.  [name], PharmD, BCPS Pharmacy: [phone] 08/10/19 2:26 PM

## 2019-08-11 ENCOUNTER — Other Ambulatory Visit: Payer: BC Managed Care – PPO

## 2019-08-11 ENCOUNTER — Ambulatory Visit (HOSPITAL_COMMUNITY): Payer: Medicare Other

## 2019-08-11 ENCOUNTER — Telehealth: Payer: Self-pay

## 2019-08-11 LAB — GLUCOSE, CAPILLARY
Glucose-Capillary: 88 mg/dL (ref 70–99)
Glucose-Capillary: 121 mg/dL — ABNORMAL HIGH (ref 70–99)
Glucose-Capillary: 302 mg/dL — ABNORMAL HIGH (ref 70–99)
Glucose-Capillary: 147 mg/dL — ABNORMAL HIGH (ref 70–99)
Glucose-Capillary: 146 mg/dL — ABNORMAL HIGH (ref 70–99)

## 2019-08-11 LAB — COMPREHENSIVE METABOLIC PANEL
ALT: 34 U/L (ref 0–44)
AST: 27 U/L (ref 15–41)
Albumin: 3.2 g/dL — ABNORMAL LOW (ref 3.5–5.0)
Alkaline Phosphatase: 73 U/L (ref 38–126)
Anion gap: 10 (ref 5–15)
BUN: 18 mg/dL (ref 8–23)
CO2: 28 mmol/L (ref 22–32)
Calcium: 8.6 mg/dL — ABNORMAL LOW (ref 8.9–10.3)
Chloride: 102 mmol/L (ref 98–111)
Creatinine, Ser: 0.82 mg/dL (ref 0.61–1.24)
GFR calc Af Amer: >60 mL/min (ref >60)
GFR calc non Af Amer: >60 mL/min (ref >60)
Glucose, Bld: 100 mg/dL — ABNORMAL HIGH (ref 70–99)
Potassium: 4.7 mmol/L (ref 3.5–5.1)
Sodium: 140 mmol/L (ref 135–145)
Total Bilirubin: 1.1 mg/dL (ref 0.3–1.2)
Total Protein: 6.3 g/dL — ABNORMAL LOW (ref 6.5–8.1)

## 2019-08-11 LAB — CBC WITH DIFFERENTIAL/PLATELET
Abs Immature Granulocytes: 0.03 10*3/uL (ref 0.00–0.07)
Basophils Absolute: 0 10*3/uL (ref 0.0–0.1)
Basophils Relative: 1 %
Eosinophils Absolute: 0.1 10*3/uL (ref 0.0–0.5)
Eosinophils Relative: 1 %
HCT: 35.8 % — ABNORMAL LOW (ref 39.0–52.0)
Hemoglobin: 11.4 g/dL — ABNORMAL LOW (ref 13.0–17.0)
Immature Granulocytes: 0 %
Lymphocytes Relative: 7 %
Lymphs Abs: 0.6 10*3/uL — ABNORMAL LOW (ref 0.7–4.0)
MCH: 33.2 pg (ref 26.0–34.0)
MCHC: 31.8 g/dL (ref 30.0–36.0)
MCV: 104.4 fL — ABNORMAL HIGH (ref 80.0–100.0)
Monocytes Absolute: 0.7 10*3/uL (ref 0.1–1.0)
Monocytes Relative: 7 %
Neutro Abs: 7.3 10*3/uL (ref 1.7–7.7)
Neutrophils Relative %: 84 %
Platelets: 159 10*3/uL (ref 150–400)
RBC: 3.43 MIL/uL — ABNORMAL LOW (ref 4.22–5.81)
RDW: 13.2 % (ref 11.5–15.5)
WBC: 8.8 10*3/uL (ref 4.0–10.5)
nRBC: 0 % (ref 0.0–0.2)

## 2019-08-11 LAB — C-REACTIVE PROTEIN: CRP: 9.6 mg/dL — ABNORMAL HIGH (ref <1.0)

## 2019-08-11 LAB — FERRITIN: Ferritin: 559 ng/mL — ABNORMAL HIGH (ref 24–336)

## 2019-08-11 LAB — D-DIMER, QUANTITATIVE: D-Dimer, Quant: 1.28 ug/mL-FEU — ABNORMAL HIGH (ref 0.00–0.50)

## 2019-08-11 LAB — PHOSPHORUS: Phosphorus: 3.4 mg/dL (ref 2.5–4.6)

## 2019-08-11 LAB — MAGNESIUM: Magnesium: 2.3 mg/dL (ref 1.7–2.4)

## 2019-08-11 MED ORDER — INSULIN ASPART 100 UNIT/ML ~~LOC~~ SOLN
4.0000 [IU] | Freq: Three times a day (TID) | SUBCUTANEOUS | Status: DC
  Administered 2019-08-11 – 2019-09-02 (×51): 4 [IU] via SUBCUTANEOUS

## 2019-08-11 NOTE — Telephone Encounter (Signed)
Oral Oncology Patient Advocate Encounter  Received notification from [employer] that prior authorization for Promacta is required.  PA submitted on CoverMyMeds Key [id number] Status is pending  Oral Oncology Clinic will continue to follow.  [place] [facility] [place] Phone [phone] Fax [phone] 08/11/2019 10:24 AM

## 2019-08-11 NOTE — Progress Notes (Signed)
61- Wife called for updates. Call went to voicemail. Message left for her to call back. Will update family whenever, she calls back.

## 2019-08-11 NOTE — Progress Notes (Signed)
PROGRESS NOTE  Vincent [name] [id number] DOB: 25-May-1951 DOA: 08/06/2019 PCP: [place] Downing, MD  HPI/Recap of past 13 hours: 69 year old male with past medical history of diabetes mellitus type 2, hypertension and metastatic pancreatic cancer, currently on chemotherapy, admitted on 12/30 with Covid and secondary hypoxia requiring 8 L of oxygen.  Patient started on IV steroids and remdesivir.  Not felt to be a candidate for Actemra given that he is on chemo.  Following admission, oxygenation peaked as high as 12 L x 1/2.  Since then, has been able to wean down back to 8 L.  Overnight, required more oxygen and is currently at 15 L high flow.  He is still breathing comfortably and has no complaints.  Assessment/Plan: Principal Problem:   Pneumonia due to COVID-19 virus causing acute respiratory failure with hypoxia: Continue Remdisivir and steroids.  Not a candidate for Actemra.  Patient willing to do convalescent plasma which we will give, should breathing worsen at any point. Active Problems:   Pancreatic cancer [facility]): Followed by Dr. Burr Medico at oncology.  Wife was able to bring in home medicine of Promacta, for bone marrow stimulation.  We will start this tomorrow morning.  This may also help his oxygenation     Type 2 diabetes mellitus ([place]): A1c notes excellent control at 6.6.  CBGs overall have been trending upward.  We will add scheduled NovoLog to meals    HLD (hyperlipidemia): Stable continue statin.   Code Status: DNR  Family Communication: Updated wife by phone   Disposition Plan: Home once weaned off of oxygen   Consultants:  None  Procedures:  None  Antimicrobials:  IV Remdisivir 12/30-1/3  DVT prophylaxis: Lovenox   Objective: Vitals:   08/11/19 0410 08/11/19 0718  BP:    Pulse: 64   Resp: 17   Temp:  98.8 F (37.1 C)  SpO2: 90%     Intake/Output Summary (Last 24 hours) at 08/11/2019 1326 Last data filed at 08/11/2019 0643 Gross per 24  hour  Intake 846.52 ml  Output 1250 ml  Net -403.48 ml   Filed Weights   08/06/19 2031  Weight: 105.2 kg   Body mass index is 28.24 kg/m.  Exam:   General: Alert and oriented x3, no acute distress  HEENT: Normocephalic and atraumatic, mucous membranes are moist  Cardiovascular: Regular rate and rhythm, S1-S2  Respiratory: Clear auscultation bilaterally  Abdomen: Soft, nontender, nondistended, positive bowel sounds  Musculoskeletal: No clubbing or cyanosis or edema  Skin: No skin breaks, tears or lesions  Neuro: No focal deficits  Psychiatry: Appropriate, no evidence of psychoses   Data Reviewed: CBC: Recent Labs  Lab 08/06/19 2153 08/08/19 0937 08/09/19 0729 08/10/19 0149 08/11/19 0635  WBC 9.4 6.4 4.7 7.1 8.8  NEUTROABS 8.5* 5.5 3.6 5.7 7.3  HGB 12.5* 12.2* 11.0* 11.5* 11.4*  HCT 38.1* 37.8* 34.1* 36.1* 35.8*  MCV 103.8* 102.2* 102.7* 103.1* 104.4*  PLT 153 208 151 171 [id number]   Basic Metabolic Panel: Recent Labs  Lab 08/06/19 2153 08/08/19 0937 08/09/19 0729 08/10/19 0149 08/11/19 0635  NA 132* 138 139 140 140  K 3.9 3.7 3.9 4.0 4.7  CL 97* 103 103 103 102  CO2 24 23 27 28 28   GLUCOSE 149* 193* 148* 87 100*  BUN 16 28* 24* 20 18  CREATININE 1.04 1.24 0.96 0.85 0.82  CALCIUM 8.5* 8.8* 8.5* 8.5* 8.6*  MG  --  2.2 2.3 2.4 2.3  PHOS  --  2.4* 3.5 3.6  3.4   GFR: Estimated Creatinine Clearance: 118.1 mL/min (by C-G formula based on SCr of 0.82 mg/dL). Liver Function Tests: Recent Labs  Lab 08/06/19 2153 08/08/19 0937 08/09/19 0729 08/10/19 0149 08/11/19 0635  AST 38 37 36 36 27  ALT 38 42 39 39 34  ALKPHOS 81 80 72 73 73  BILITOT 0.9 0.6 0.5 0.2* 1.1  PROT 6.9 7.1 6.4* 6.6 6.3*  ALBUMIN 3.5 3.3* 3.1* 3.2* 3.2*   No results for input(s): LIPASE, AMYLASE in the last 168 hours. No results for input(s): AMMONIA in the last 168 hours. Coagulation Profile: No results for input(s): INR, PROTIME in the last 168 hours. Cardiac Enzymes: No  results for input(s): CKTOTAL, CKMB, CKMBINDEX, TROPONINI in the last 168 hours. BNP (last 3 results) No results for input(s): PROBNP in the last 8760 hours. HbA1C: No results for input(s): HGBA1C in the last 72 hours. CBG: Recent Labs  Lab 08/10/19 2051 08/10/19 2322 08/11/19 0359 08/11/19 0809 08/11/19 1139  GLUCAP 145* 139* 88 121* 302*   Lipid Profile: Recent Labs    08/09/19 0729  CHOL 100  HDL 19*  LDLCALC 66  TRIG 75  CHOLHDL 5.3   Thyroid Function Tests: No results for input(s): TSH, T4TOTAL, FREET4, T3FREE, THYROIDAB in the last 72 hours. Anemia Panel: Recent Labs    08/10/19 0149 08/11/19 0635  FERRITIN 523* 559*   Urine analysis:    Component Value Date/Time   COLORURINE YELLOW 08/04/2019 [place] 08/04/2019 1119   LABSPEC 1.021 08/04/2019 1119   PHURINE 5.0 08/04/2019 1119   GLUCOSEU NEGATIVE 08/04/2019 1119   HGBUR NEGATIVE 08/04/2019 1119   BILIRUBINUR NEGATIVE 08/04/2019 1119   KETONESUR 5 (A) 08/04/2019 1119   PROTEINUR 30 (A) 08/04/2019 1119   NITRITE NEGATIVE 08/04/2019 1119   LEUKOCYTESUR NEGATIVE 08/04/2019 1119   Sepsis Labs: @LABRCNTIP (procalcitonin:4,lacticidven:4)  ) Recent Results (from the past 240 hour(s))  Urine culture     Status: None   Collection Time: 08/04/19 11:18 AM   Specimen: Urine, Clean Catch  Result Value Ref Range Status   Specimen Description   Final    URINE, CLEAN CATCH Performed at [facility], [place] [address], [place], [place] [address]    Special Requests   Final    NONE Performed at [facility], [place] [address], [place], [place] [address]    Culture   Final    NO GROWTH Performed at [facility], [place] [address], [place], [place] [address]    Report Status 08/05/2019 FINAL  Final  Culture, blood (routine x 2)     Status: None   Collection Time: 08/04/19 11:32 AM   Specimen: BLOOD LEFT ARM  Result Value Ref Range Status    Specimen Description   Final    BLOOD LEFT ARM Performed at [facility], [place] [address], [place], [place] [address]    Special Requests   Final    NONE Performed at [facility], [place] [address], [place], [place] [address]    Culture   Final    NO GROWTH 5 DAYS Performed at [facility], [place] [address] [place], [place] [address]    Report Status 08/09/2019 FINAL  Final  Culture, Blood     Status: None   Collection Time: 08/04/19 11:52 AM   Specimen: Porta Cath; Blood  Result Value Ref Range Status   Specimen Description   Final  PORTA CATH Performed at [facility], [place] [address] [place], [place] [address]    Special Requests   Final    NONE Performed at [facility], [place] [address], [place], [place] [address]    Culture   Final    NO GROWTH 5 DAYS Performed at [facility], [place] [address] [place], [place] [address]    Report Status 08/09/2019 FINAL  Final  Blood Culture (routine x 2)     Status: None (Preliminary result)   Collection Time: 08/06/19 10:07 PM   Specimen: BLOOD  Result Value Ref Range Status   Specimen Description   Final    BLOOD RIGHT ANTECUBITAL Performed at [place] [address], [place], [place] [address]    Special Requests   Final    BOTTLES DRAWN AEROBIC AND ANAEROBIC Blood Culture adequate volume Performed at [place] [address], [place], [place] [address]    Culture   Final    NO GROWTH 3 DAYS Performed at [facility], [place] [address], [place], [place] [address]    Report Status PENDING  Incomplete  Blood Culture (routine x 2)     Status: None (Preliminary result)   Collection Time: 08/06/19 10:08 PM   Specimen: BLOOD  Result Value Ref Range Status   Specimen Description   Final    BLOOD UNKNOWN Performed at [place] [address],  [place], [place] [address]    Special Requests   Final    BOTTLES DRAWN AEROBIC AND ANAEROBIC Blood Culture adequate volume Performed at [place] [address], [place], [place] [address]    Culture   Final    NO GROWTH 3 DAYS Performed at [facility], [place] [address], [place], [place] [address]    Report Status PENDING  Incomplete      Studies: No results found.  Scheduled Meds: . vitamin C  500 mg Oral BID  . aspirin EC  81 mg Oral QPC breakfast  . Chlorhexidine Gluconate Cloth  6 each Topical Daily  . cholecalciferol  2,000 Units Oral Daily  . dexamethasone (DECADRON) injection  6 mg Intravenous Q24H  . eltrombopag  25 mg Oral Daily  . enoxaparin (LOVENOX) injection  40 mg Subcutaneous QHS  . gabapentin  300 mg Oral QHS  . insulin aspart  0-15 Units Subcutaneous Q4H  . simvastatin  20 mg Oral QPM  . zinc sulfate  220 mg Oral Daily    Continuous Infusions: . sodium chloride 75 mL/hr at 08/11/19 0823     LOS: 4 days     Annita Brod, MD [facility]  To reach me or the doctor on call, go to: [email or web address] Password [email or web address]  08/11/2019, 1:26 PM

## 2019-08-12 ENCOUNTER — Telehealth: Payer: Self-pay | Admitting: Hematology

## 2019-08-12 ENCOUNTER — Telehealth: Payer: Self-pay

## 2019-08-12 LAB — CBC WITH DIFFERENTIAL/PLATELET
Abs Immature Granulocytes: 0.02 10*3/uL (ref 0.00–0.07)
Basophils Absolute: 0.1 10*3/uL (ref 0.0–0.1)
Basophils Relative: 1 %
Eosinophils Absolute: 0.1 10*3/uL (ref 0.0–0.5)
Eosinophils Relative: 1 %
HCT: 33.8 % — ABNORMAL LOW (ref 39.0–52.0)
Hemoglobin: 10.8 g/dL — ABNORMAL LOW (ref 13.0–17.0)
Immature Granulocytes: 0 %
Lymphocytes Relative: 7 %
Lymphs Abs: 0.5 10*3/uL — ABNORMAL LOW (ref 0.7–4.0)
MCH: 32.7 pg (ref 26.0–34.0)
MCHC: 32 g/dL (ref 30.0–36.0)
MCV: 102.4 fL — ABNORMAL HIGH (ref 80.0–100.0)
Monocytes Absolute: 0.6 10*3/uL (ref 0.1–1.0)
Monocytes Relative: 9 %
Neutro Abs: 6 10*3/uL (ref 1.7–7.7)
Neutrophils Relative %: 82 %
Platelets: 154 10*3/uL (ref 150–400)
RBC: 3.3 MIL/uL — ABNORMAL LOW (ref 4.22–5.81)
RDW: 13.4 % (ref 11.5–15.5)
WBC: 7.2 10*3/uL (ref 4.0–10.5)
nRBC: 0 % (ref 0.0–0.2)

## 2019-08-12 LAB — PHOSPHORUS: Phosphorus: 2.4 mg/dL — ABNORMAL LOW (ref 2.5–4.6)

## 2019-08-12 LAB — COMPREHENSIVE METABOLIC PANEL
ALT: 33 U/L (ref 0–44)
AST: 54 U/L — ABNORMAL HIGH (ref 15–41)
Albumin: 2.8 g/dL — ABNORMAL LOW (ref 3.5–5.0)
Alkaline Phosphatase: 63 U/L (ref 38–126)
Anion gap: 10 (ref 5–15)
BUN: 15 mg/dL (ref 8–23)
CO2: 25 mmol/L (ref 22–32)
Calcium: 8.3 mg/dL — ABNORMAL LOW (ref 8.9–10.3)
Chloride: 103 mmol/L (ref 98–111)
Creatinine, Ser: 0.75 mg/dL (ref 0.61–1.24)
GFR calc Af Amer: >60 mL/min (ref >60)
GFR calc non Af Amer: >60 mL/min (ref >60)
Glucose, Bld: 135 mg/dL — ABNORMAL HIGH (ref 70–99)
Potassium: 4.1 mmol/L (ref 3.5–5.1)
Sodium: 138 mmol/L (ref 135–145)
Total Bilirubin: 0.8 mg/dL (ref 0.3–1.2)
Total Protein: 5.7 g/dL — ABNORMAL LOW (ref 6.5–8.1)

## 2019-08-12 LAB — CULTURE, BLOOD (ROUTINE X 2)
Culture: NO GROWTH
Culture: NO GROWTH
Special Requests: ADEQUATE
Special Requests: ADEQUATE

## 2019-08-12 LAB — BRAIN NATRIURETIC PEPTIDE: B Natriuretic Peptide: 97.1 pg/mL (ref 0.0–100.0)

## 2019-08-12 LAB — FERRITIN: Ferritin: 484 ng/mL — ABNORMAL HIGH (ref 24–336)

## 2019-08-12 LAB — MAGNESIUM: Magnesium: 1.8 mg/dL (ref 1.7–2.4)

## 2019-08-12 LAB — GLUCOSE, CAPILLARY
Glucose-Capillary: 100 mg/dL — ABNORMAL HIGH (ref 70–99)
Glucose-Capillary: 111 mg/dL — ABNORMAL HIGH (ref 70–99)
Glucose-Capillary: 142 mg/dL — ABNORMAL HIGH (ref 70–99)
Glucose-Capillary: 163 mg/dL — ABNORMAL HIGH (ref 70–99)
Glucose-Capillary: 64 mg/dL — ABNORMAL LOW (ref 70–99)
Glucose-Capillary: 95 mg/dL (ref 70–99)

## 2019-08-12 LAB — D-DIMER, QUANTITATIVE: D-Dimer, Quant: 1.52 ug/mL-FEU — ABNORMAL HIGH (ref 0.00–0.50)

## 2019-08-12 LAB — C-REACTIVE PROTEIN: CRP: 10.9 mg/dL — ABNORMAL HIGH (ref <1.0)

## 2019-08-12 NOTE — Progress Notes (Signed)
Pt BG noted at 64. Hypoglycemic standing orders initiated. Recheck B/G noted at 120

## 2019-08-12 NOTE — Telephone Encounter (Signed)
Ms. Keomany called stating that Mr Vincent [name] is at [facility] undergoing covid 16 treatment.  She wanted to know what treatment he would get for his cancer.  I told her that he can't received treatment until his covid has resolved. She verbalized understanding.

## 2019-08-12 NOTE — Progress Notes (Signed)
PROGRESS NOTE  Vincent [name] [id number] DOB: 1951-04-08 DOA: 08/06/2019 PCP: [place] Downing, MD  HPI/Recap of past 65 hours: 69 year old male with past medical history of diabetes mellitus type 2, hypertension and metastatic pancreatic cancer, currently on chemotherapy, admitted on 12/30 with Covid and secondary hypoxia requiring 8 L of oxygen.  Patient started on IV steroids and remdesivir.  Not felt to be a candidate for Actemra given that he is on chemo.  Following admission, oxygenation peaked as high as 12 L x 1/2.  Since then, has been able to wean down back to 8 L.  On the night of 1/3, required more oxygen and is currently at 15 L high flow.  Since that time, we have been trying to slowly wean back down.  Patient himself remains quite comfortable with no complaints.  Assessment/Plan: Principal Problem:   Pneumonia due to COVID-19 virus causing acute respiratory failure with hypoxia: Continue Remdisivir and steroids.  Not a candidate for Actemra.  Patient willing to do convalescent plasma which we will give, should breathing worsen at any point.  Noted borderline cardiomegaly on his x-ray.  Have ordered a BNP  Active Problems:   Pancreatic cancer [facility]): Followed by Dr. Burr Medico at oncology.  Wife was able to bring in home medicine of Promacta, for bone marrow stimulation.  This was started on 1/4.  His oncologist followed up and after review, recommended for it to be stopped for now.  Medication stopped as of 1/5.     Type 2 diabetes mellitus ([facility]): A1c notes excellent control at 6.6.  CBGs overall have been trending upward.  We will add scheduled NovoLog to meals.  CBGs have been stable    HLD (hyperlipidemia): Stable continue statin.   Code Status: DNR  Family Communication: Left message for wife  Disposition Plan: Home once weaned off of oxygen   Consultants:  None  Procedures:  None  Antimicrobials:  IV Remdisivir 12/30-1/3  DVT prophylaxis:  Lovenox   Objective: Vitals:   08/12/19 0400 08/12/19 0852  BP: 125/80   Pulse: 91 (!) 108  Resp: (!) 29 (!) 22  Temp: (!) 97.4 F (36.3 C)   SpO2: (!) 85% 92%    Intake/Output Summary (Last 24 hours) at 08/12/2019 1621 Last data filed at 08/12/2019 1300 Gross per 24 hour  Intake 1650 ml  Output 1675 ml  Net -25 ml   Filed Weights   08/06/19 2031  Weight: 105.2 kg   Body mass index is 28.24 kg/m.  Exam:   General: Alert and oriented x3, no acute distress  HEENT: Normocephalic and atraumatic, mucous membranes are moist  Cardiovascular: Regular rate and rhythm, S1-S2  Respiratory: Clear auscultation bilaterally  Abdomen: Soft, nontender, nondistended, positive bowel sounds  Musculoskeletal: No clubbing or cyanosis or edema  Skin: No skin breaks, tears or lesions  Neuro: No focal deficits  Psychiatry: Appropriate, no evidence of psychoses   Data Reviewed: CBC: Recent Labs  Lab 08/08/19 0937 08/09/19 0729 08/10/19 0149 08/11/19 0635 08/12/19 0725  WBC 6.4 4.7 7.1 8.8 7.2  NEUTROABS 5.5 3.6 5.7 7.3 6.0  HGB 12.2* 11.0* 11.5* 11.4* 10.8*  HCT 37.8* 34.1* 36.1* 35.8* 33.8*  MCV 102.2* 102.7* 103.1* 104.4* 102.4*  PLT 208 151 171 159 [id number]   Basic Metabolic Panel: Recent Labs  Lab 08/08/19 0937 08/09/19 0729 08/10/19 0149 08/11/19 0635 08/12/19 0725  NA 138 139 140 140 138  K 3.7 3.9 4.0 4.7 4.1  CL 103 103 103 102 103  CO2 23 27 28 28 25   GLUCOSE 193* 148* 87 100* 135*  BUN 28* 24* 20 18 15   CREATININE 1.24 0.96 0.85 0.82 0.75  CALCIUM 8.8* 8.5* 8.5* 8.6* 8.3*  MG 2.2 2.3 2.4 2.3 1.8  PHOS 2.4* 3.5 3.6 3.4 2.4*   GFR: Estimated Creatinine Clearance: 121 mL/min (by C-G formula based on SCr of 0.75 mg/dL). Liver Function Tests: Recent Labs  Lab 08/08/19 0937 08/09/19 0729 08/10/19 0149 08/11/19 0635 08/12/19 0725  AST 37 36 36 27 54*  ALT 42 39 39 34 33  ALKPHOS 80 72 73 73 63  BILITOT 0.6 0.5 0.2* 1.1 0.8  PROT 7.1 6.4* 6.6 6.3*  5.7*  ALBUMIN 3.3* 3.1* 3.2* 3.2* 2.8*   No results for input(s): LIPASE, AMYLASE in the last 168 hours. No results for input(s): AMMONIA in the last 168 hours. Coagulation Profile: No results for input(s): INR, PROTIME in the last 168 hours. Cardiac Enzymes: No results for input(s): CKTOTAL, CKMB, CKMBINDEX, TROPONINI in the last 168 hours. BNP (last 3 results) No results for input(s): PROBNP in the last 8760 hours. HbA1C: No results for input(s): HGBA1C in the last 72 hours. CBG: Recent Labs  Lab 08/11/19 2349 08/12/19 0436 08/12/19 0812 08/12/19 1115 08/12/19 1541  GLUCAP 111* 95 142* 100* 163*   Lipid Profile: No results for input(s): CHOL, HDL, LDLCALC, TRIG, CHOLHDL, LDLDIRECT in the last 72 hours. Thyroid Function Tests: No results for input(s): TSH, T4TOTAL, FREET4, T3FREE, THYROIDAB in the last 72 hours. Anemia Panel: Recent Labs    08/11/19 0635 08/12/19 0725  FERRITIN 559* 484*   Urine analysis:    Component Value Date/Time   COLORURINE YELLOW 08/04/2019 [place] 08/04/2019 1119   LABSPEC 1.021 08/04/2019 1119   PHURINE 5.0 08/04/2019 1119   GLUCOSEU NEGATIVE 08/04/2019 1119   HGBUR NEGATIVE 08/04/2019 1119   BILIRUBINUR NEGATIVE 08/04/2019 1119   KETONESUR 5 (A) 08/04/2019 1119   PROTEINUR 30 (A) 08/04/2019 1119   NITRITE NEGATIVE 08/04/2019 1119   LEUKOCYTESUR NEGATIVE 08/04/2019 1119   Sepsis Labs: @LABRCNTIP (procalcitonin:4,lacticidven:4)  ) Recent Results (from the past 240 hour(s))  Urine culture     Status: None   Collection Time: 08/04/19 11:18 AM   Specimen: Urine, Clean Catch  Result Value Ref Range Status   Specimen Description   Final    URINE, CLEAN CATCH Performed at [facility], [place] [address] [place], [place] [address]    Special Requests   Final    NONE Performed at [facility], [place] [address], [place], [place] [address]    Culture   Final    NO GROWTH  Performed at [facility], [place] [address] [place], [place] [address]    Report Status 08/05/2019 FINAL  Final  Culture, blood (routine x 2)     Status: None   Collection Time: 08/04/19 11:32 AM   Specimen: BLOOD LEFT ARM  Result Value Ref Range Status   Specimen Description   Final    BLOOD LEFT ARM Performed at [facility], [place] [address] [place], [place] [address]    Special Requests   Final    NONE Performed at [facility], [place] [address] [place], [place] [address]    Culture   Final    NO GROWTH 5 DAYS Performed at [facility], [place] [address], [place], [place] [address]    Report Status 08/09/2019 FINAL  Final  Culture, Blood     Status: None   Collection Time: 08/04/19 11:52 AM   Specimen: Porta Cath; Blood  Result Value Ref Range Status   Specimen Description   Final    PORTA CATH Performed at [facility], [place] [address], [place], [place] [address]    Special Requests   Final    NONE Performed at [facility], [place] [address], [place], [place] [address]    Culture   Final    NO GROWTH 5 DAYS Performed at [facility], [place] [address] [place], [place] [address]    Report Status 08/09/2019 FINAL  Final  Blood Culture (routine x 2)     Status: None   Collection Time: 08/06/19 10:07 PM   Specimen: BLOOD  Result Value Ref Range Status   Specimen Description   Final    BLOOD RIGHT ANTECUBITAL Performed at [place] [address], [place], [place] [address]    Special Requests   Final    BOTTLES DRAWN AEROBIC AND ANAEROBIC Blood Culture adequate volume Performed at [place] [address] [place], [place] [address]    Culture   Final    NO GROWTH 5 DAYS Performed at [facility], [place] [address], [place], [place] [address]    Report Status 08/12/2019 FINAL  Final  Blood Culture (routine x  2)     Status: None   Collection Time: 08/06/19 10:08 PM   Specimen: BLOOD  Result Value Ref Range Status   Specimen Description   Final    BLOOD UNKNOWN Performed at [place] [address], [place], [place] [address]    Special Requests   Final    BOTTLES DRAWN AEROBIC AND ANAEROBIC Blood Culture adequate volume Performed at [place] [address] [place], [place] [address]    Culture   Final    NO GROWTH 5 DAYS Performed at [facility], [place] [address], [place], [place] [address]    Report Status 08/12/2019 FINAL  Final      Studies: No results found.  Scheduled Meds: . vitamin C  500 mg Oral BID  . aspirin EC  81 mg Oral QPC breakfast  . Chlorhexidine Gluconate Cloth  6 each Topical Daily  . cholecalciferol  2,000 Units Oral Daily  . dexamethasone (DECADRON) injection  6 mg Intravenous Q24H  . enoxaparin (LOVENOX) injection  40 mg Subcutaneous QHS  . gabapentin  300 mg Oral QHS  . insulin aspart  0-15 Units Subcutaneous Q4H  . insulin aspart  4 Units Subcutaneous TID WC  . simvastatin  20 mg Oral QPM  . zinc sulfate  220 mg Oral Daily    Continuous Infusions:    LOS: 5 days     Annita Brod, MD [facility]  To reach me or the doctor on call, go to: [email or web address] Password [email or web address]  08/12/2019, 4:21 PM

## 2019-08-12 NOTE — Telephone Encounter (Signed)
I have reviewed his chart and lab results. I do not think he needs Promacta for now, which was initially prescribed for chemotherapy induced thrombocytopenia.  Since he is platelet count has been normal, and Promacta increases risk of thrombosis, patient has high risk of thrombosis due to Covid and pancreatic cancer, I think the risk of Promacta is outweighed benefit at this point.  I have sent a message to hospitalist Dr. [place] Pink today.  I called his wife and discussed the above. Unfortunately she is positive for COVID also, but asymptomatic at home.  I encouraged her to contact her primary care physician for advice.   Truitt Merle  08/12/2019

## 2019-08-13 ENCOUNTER — Ambulatory Visit: Payer: BC Managed Care – PPO

## 2019-08-13 ENCOUNTER — Ambulatory Visit: Payer: BC Managed Care – PPO | Admitting: Hematology

## 2019-08-13 ENCOUNTER — Inpatient Hospital Stay (HOSPITAL_COMMUNITY): Payer: Medicare Other

## 2019-08-13 LAB — CBC WITH DIFFERENTIAL/PLATELET
Abs Immature Granulocytes: 0.04 10*3/uL (ref 0.00–0.07)
Basophils Absolute: 0 10*3/uL (ref 0.0–0.1)
Basophils Relative: 0 %
Eosinophils Absolute: 0 10*3/uL (ref 0.0–0.5)
Eosinophils Relative: 0 %
HCT: 40 % (ref 39.0–52.0)
Hemoglobin: 12.7 g/dL — ABNORMAL LOW (ref 13.0–17.0)
Immature Granulocytes: 1 %
Lymphocytes Relative: 4 %
Lymphs Abs: 0.3 10*3/uL — ABNORMAL LOW (ref 0.7–4.0)
MCH: 32.4 pg (ref 26.0–34.0)
MCHC: 31.8 g/dL (ref 30.0–36.0)
MCV: 102 fL — ABNORMAL HIGH (ref 80.0–100.0)
Monocytes Absolute: 0.3 10*3/uL (ref 0.1–1.0)
Monocytes Relative: 3 %
Neutro Abs: 7.5 10*3/uL (ref 1.7–7.7)
Neutrophils Relative %: 92 %
Platelets: 185 10*3/uL (ref 150–400)
RBC: 3.92 MIL/uL — ABNORMAL LOW (ref 4.22–5.81)
RDW: 13.7 % (ref 11.5–15.5)
WBC: 8.1 10*3/uL (ref 4.0–10.5)
nRBC: 0 % (ref 0.0–0.2)

## 2019-08-13 LAB — MAGNESIUM: Magnesium: 1.9 mg/dL (ref 1.7–2.4)

## 2019-08-13 LAB — GLUCOSE, CAPILLARY
Glucose-Capillary: 120 mg/dL — ABNORMAL HIGH (ref 70–99)
Glucose-Capillary: 125 mg/dL — ABNORMAL HIGH (ref 70–99)
Glucose-Capillary: 129 mg/dL — ABNORMAL HIGH (ref 70–99)
Glucose-Capillary: 132 mg/dL — ABNORMAL HIGH (ref 70–99)
Glucose-Capillary: 178 mg/dL — ABNORMAL HIGH (ref 70–99)
Glucose-Capillary: 189 mg/dL — ABNORMAL HIGH (ref 70–99)
Glucose-Capillary: 258 mg/dL — ABNORMAL HIGH (ref 70–99)

## 2019-08-13 LAB — D-DIMER, QUANTITATIVE: D-Dimer, Quant: 1.92 ug/mL-FEU — ABNORMAL HIGH (ref 0.00–0.50)

## 2019-08-13 LAB — PHOSPHORUS: Phosphorus: 2.7 mg/dL (ref 2.5–4.6)

## 2019-08-13 NOTE — Progress Notes (Signed)
Notified by tele monitoring that patient recently had a 10 beat run of SVT.  Physician notified.  No new orders at this time.  Patient remains on both nonrebreather mask and HFNC to maintain saturations. Will continue to monitor closely.

## 2019-08-13 NOTE — Progress Notes (Signed)
   08/13/19 0752  Vitals  Temp (!) 101 F (38.3 C)  Axillary Pt diaphoretic Tylenol administered

## 2019-08-13 NOTE — Progress Notes (Signed)
PROGRESS NOTE    Vincent [name]  [id number] DOB: 27-Jan-1951 DOA: 08/06/2019 PCP: Leonard Downing, MD    Brief Narrative:  69 year old gentleman with history of type 2 diabetes, hypertension, metastatic pancreatic cancer on chemotherapy, last chemotherapy on 12/16 admitted on 12/30 with low oxygen level.  Initially requiring 8 L of oxygen.  His oxygen requirements has been fluctuating.  Unable to tolerate mobility with tachypnea and tachycardia.   Assessment & Plan:   Principal Problem:   Pneumonia due to COVID-19 virus Active Problems:   Pancreatic cancer ([place])   Acute respiratory failure with hypoxia ([place])   Type 2 diabetes mellitus (HCC)   HLD (hyperlipidemia)  Acute hypoxemic respiratory failure due to pneumonia from COVID-19 virus: Continue to monitor due to significant symptoms, still on significant oxygen. chest physiotherapy, incentive spirometry, deep breathing exercises, sputum induction, mucolytic's and bronchodilators. Supplemental oxygen to keep saturations more than 88%. Covid directed therapy with , steroids, remains on dexamethasone remdesivir, finished 5 days of therapy actemra, not given due to active chemotherapy antibiotics, not indicated at this time.  His blood counts are adequate. Due to severity of symptoms, patient will need daily inflammatory markers, chest x-rays, liver function test to monitor and direct COVID-19 therapies. Chest x-ray was done and reviewed today shows widespread viral pneumonia. Repeat blood cultures today, holding off on antibiotics.  Pancreatic cancer: Followed by Dr. Burr Medico with oncology.  Recommended no need to continue Promacta.  Type 2 diabetes: On insulin coverage.  Not on treatment at home.  Hyperlipidemia: On a statin that he will continue.   DVT prophylaxis: Lovenox subcu Code Status: DNR Family Communication: None.  Patient is talking to his wife Disposition Plan: Anticipate home after clinical  improvement.   Consultants:   None  Procedures:   None  Antimicrobials:  Anti-infectives (From admission, onward)   Start     Dose/Rate Route Frequency Ordered Stop   08/08/19 1000  remdesivir 100 mg in sodium chloride 0.9 % 100 mL IVPB     100 mg 200 mL/hr over 30 Minutes Intravenous Daily 08/07/19 0151 08/11/19 0855   08/07/19 0200  remdesivir 200 mg in sodium chloride 0.9% 250 mL IVPB     200 mg 580 mL/hr over 30 Minutes Intravenous Once 08/07/19 0151 08/07/19 0341         Subjective: Patient seen and examined.  No overnight events.  Early morning nursing staff try to put him to chair and that caused him short of breath, became tachypneic and tachycardic.  He was also needing increasing oxygen.  Currently on 15 L of oxygen. Went to examine the patient.  He was stabilized.  Chest x-ray was repeated that shows persistent worsening bilateral viral pneumonia.  Objective: Vitals:   08/13/19 1046 08/13/19 1101 08/13/19 1115 08/13/19 1200  BP: 107/85 102/71 102/71 113/81  Pulse: 94 88 86 80  Resp: (!) 25 (!) 27 (!) 25 20  Temp: 98 F (36.7 C) 97.8 F (36.6 C)  97.7 F (36.5 C)  TempSrc: Oral Oral  Oral  SpO2: 91% 90% 91% 94%  Weight:      Height:        Intake/Output Summary (Last 24 hours) at 08/13/2019 1335 Last data filed at 08/13/2019 0606 Gross per 24 hour  Intake --  Output 1650 ml  Net -1650 ml   Filed Weights   08/06/19 2031  Weight: 105.2 kg    Examination:  General exam: Appears calm and comfortable, chronically sick looking but not in  any distress though on very high flow oxygen.  He was able to talk in full sentences. Respiratory system: Clear to auscultation. Respiratory effort normal.  Some conducted airway sounds.  Has a port on his right chest that is nontender. Cardiovascular system: S1 & S2 heard, RRR.  Gastrointestinal system: Abdomen is nondistended, soft and nontender. Central nervous system: Alert and oriented. No focal neurological  deficits. Extremities: Symmetric 5 x 5 power. Skin: No rashes, lesions or ulcers Psychiatry: Judgement and insight appear normal. Mood & affect appropriate.     Data Reviewed: I have personally reviewed following labs and imaging studies  CBC: Recent Labs  Lab 08/08/19 0937 08/09/19 0729 08/10/19 0149 08/11/19 0635 08/12/19 0725  WBC 6.4 4.7 7.1 8.8 7.2  NEUTROABS 5.5 3.6 5.7 7.3 6.0  HGB 12.2* 11.0* 11.5* 11.4* 10.8*  HCT 37.8* 34.1* 36.1* 35.8* 33.8*  MCV 102.2* 102.7* 103.1* 104.4* 102.4*  PLT 208 151 171 159 [id number]   Basic Metabolic Panel: Recent Labs  Lab 08/08/19 0937 08/09/19 0729 08/10/19 0149 08/11/19 0635 08/12/19 0725 08/13/19 0500  NA 138 139 140 140 138  --   K 3.7 3.9 4.0 4.7 4.1  --   CL 103 103 103 102 103  --   CO2 23 27 28 28 25   --   GLUCOSE 193* 148* 87 100* 135*  --   BUN 28* 24* 20 18 15   --   CREATININE 1.24 0.96 0.85 0.82 0.75  --   CALCIUM 8.8* 8.5* 8.5* 8.6* 8.3*  --   MG 2.2 2.3 2.4 2.3 1.8 1.9  PHOS 2.4* 3.5 3.6 3.4 2.4* 2.7   GFR: Estimated Creatinine Clearance: 121 mL/min (by C-G formula based on SCr of 0.75 mg/dL). Liver Function Tests: Recent Labs  Lab 08/08/19 0937 08/09/19 0729 08/10/19 0149 08/11/19 0635 08/12/19 0725  AST 37 36 36 27 54*  ALT 42 39 39 34 33  ALKPHOS 80 72 73 73 63  BILITOT 0.6 0.5 0.2* 1.1 0.8  PROT 7.1 6.4* 6.6 6.3* 5.7*  ALBUMIN 3.3* 3.1* 3.2* 3.2* 2.8*   No results for input(s): LIPASE, AMYLASE in the last 168 hours. No results for input(s): AMMONIA in the last 168 hours. Coagulation Profile: No results for input(s): INR, PROTIME in the last 168 hours. Cardiac Enzymes: No results for input(s): CKTOTAL, CKMB, CKMBINDEX, TROPONINI in the last 168 hours. BNP (last 3 results) No results for input(s): PROBNP in the last 8760 hours. HbA1C: No results for input(s): HGBA1C in the last 72 hours. CBG: Recent Labs  Lab 08/12/19 2036 08/13/19 0020 08/13/19 0603 08/13/19 0740 08/13/19 1134  GLUCAP  120* 125* 132* 129* 189*   Lipid Profile: No results for input(s): CHOL, HDL, LDLCALC, TRIG, CHOLHDL, LDLDIRECT in the last 72 hours. Thyroid Function Tests: No results for input(s): TSH, T4TOTAL, FREET4, T3FREE, THYROIDAB in the last 72 hours. Anemia Panel: Recent Labs    08/11/19 0635 08/12/19 0725  FERRITIN 559* 484*   Sepsis Labs: Recent Labs  Lab 08/06/19 2153  PROCALCITON 0.19  LATICACIDVEN 1.6    Recent Results (from the past 240 hour(s))  Urine culture     Status: None   Collection Time: 08/04/19 11:18 AM   Specimen: Urine, Clean Catch  Result Value Ref Range Status   Specimen Description   Final    URINE, CLEAN CATCH Performed at [facility], [place] [address] [place], [place] [address]    Special Requests   Final    NONE Performed  at [facility], [place] [address] [place], [place] [address]    Culture   Final    NO GROWTH Performed at [facility], [place] [address], [place], [place] [address]    Report Status 08/05/2019 FINAL  Final  Culture, blood (routine x 2)     Status: None   Collection Time: 08/04/19 11:32 AM   Specimen: BLOOD LEFT ARM  Result Value Ref Range Status   Specimen Description   Final    BLOOD LEFT ARM Performed at [facility], [place] [address] [place], [place] [address]    Special Requests   Final    NONE Performed at [facility], [place] [address], [place], [place] [address]    Culture   Final    NO GROWTH 5 DAYS Performed at [facility], [place] [address] [place] [address] [place], [place] [address]    Report Status 08/09/2019 FINAL  Final  Culture, Blood     Status: None   Collection Time: 08/04/19 11:52 AM   Specimen: Porta Cath; Blood  Result Value Ref Range Status   Specimen Description   Final    PORTA CATH Performed at [facility], [place] [address], [place], [place] [address]    Special Requests   Final     NONE Performed at [facility], [place] [address] [place], [place] [address]    Culture   Final    NO GROWTH 5 DAYS Performed at [facility], [place] [address] [place], [place] [address]    Report Status 08/09/2019 FINAL  Final  Blood Culture (routine x 2)     Status: None   Collection Time: 08/06/19 10:07 PM   Specimen: BLOOD  Result Value Ref Range Status   Specimen Description   Final    BLOOD RIGHT ANTECUBITAL Performed at [place] [address] [place], [place] [address]    Special Requests   Final    BOTTLES DRAWN AEROBIC AND ANAEROBIC Blood Culture adequate volume Performed at [place] [address], [place], [place] [address]    Culture   Final    NO GROWTH 5 DAYS Performed at [facility], [place] [address], [place], [place] [address]    Report Status 08/12/2019 FINAL  Final  Blood Culture (routine x 2)     Status: None   Collection Time: 08/06/19 10:08 PM   Specimen: BLOOD  Result Value Ref Range Status   Specimen Description   Final    BLOOD UNKNOWN Performed at [place] [address], [place], [place] [address]    Special Requests   Final    BOTTLES DRAWN AEROBIC AND ANAEROBIC Blood Culture adequate volume Performed at [place] [address] [place], [place] [address]    Culture   Final    NO GROWTH 5 DAYS Performed at [facility], [place] [address] [place],  [address]    Report Status 08/12/2019 FINAL  Final         Radiology Studies: DG CHEST PORT 1 VIEW  Result Date: 08/13/2019 CLINICAL DATA:  Pneumonia, COVID EXAM: PORTABLE CHEST 1 VIEW COMPARISON:  08/06/2019 FINDINGS: Right Port-A-Cath remains in place, unchanged. Diffuse right lung airspace disease and patchy left mid and lower lung airspace opacities, new since prior study. Heart is normal size. Low lung volumes. No effusions or acute bony  abnormality.  IMPRESSION: Diffuse right lung airspace disease and patchy left lung opacities concerning for pneumonia. Electronically Signed   By: Rolm Baptise M.D.   On: 08/13/2019 10:27        Scheduled Meds: . vitamin C  500 mg Oral BID  . aspirin EC  81 mg Oral QPC breakfast  . Chlorhexidine Gluconate Cloth  6 each Topical Daily  . cholecalciferol  2,000 Units Oral Daily  . dexamethasone (DECADRON) injection  6 mg Intravenous Q24H  . enoxaparin (LOVENOX) injection  40 mg Subcutaneous QHS  . gabapentin  300 mg Oral QHS  . insulin aspart  0-15 Units Subcutaneous Q4H  . insulin aspart  4 Units Subcutaneous TID WC  . simvastatin  20 mg Oral QPM  . zinc sulfate  220 mg Oral Daily   Continuous Infusions:   LOS: 6 days    Time spent: 25 minutes    Barb Merino, MD [facility] Pager [phone]

## 2019-08-13 NOTE — Progress Notes (Addendum)
Attempted to assist patient from bed to chair for breakfast. Sats dropped to 40's, pt complaint of fatigue and lightheadedness. O2 probe checked on finger, earlobe and forehead, good pleth and accurate reading.  RR in 40's and HR in 140's.  Assisted back to bed, pt placed on nonrebreather and HFNC to maintain sats at 90 with an approximate 10 minute recovery time.  Large increase in O2 requirements from yesterday.  Physician notified.  Will continue to monitor closely and titrate O2 as able.

## 2019-08-14 LAB — CBC WITH DIFFERENTIAL/PLATELET
Abs Immature Granulocytes: 0.03 10*3/uL (ref 0.00–0.07)
Basophils Absolute: 0 10*3/uL (ref 0.0–0.1)
Basophils Relative: 0 %
Eosinophils Absolute: 0 10*3/uL (ref 0.0–0.5)
Eosinophils Relative: 0 %
HCT: 37.7 % — ABNORMAL LOW (ref 39.0–52.0)
Hemoglobin: 12.2 g/dL — ABNORMAL LOW (ref 13.0–17.0)
Immature Granulocytes: 0 %
Lymphocytes Relative: 5 %
Lymphs Abs: 0.5 10*3/uL — ABNORMAL LOW (ref 0.7–4.0)
MCH: 32.7 pg (ref 26.0–34.0)
MCHC: 32.4 g/dL (ref 30.0–36.0)
MCV: 101.1 fL — ABNORMAL HIGH (ref 80.0–100.0)
Monocytes Absolute: 0.6 10*3/uL (ref 0.1–1.0)
Monocytes Relative: 7 %
Neutro Abs: 7.5 10*3/uL (ref 1.7–7.7)
Neutrophils Relative %: 88 %
Platelets: 206 10*3/uL (ref 150–400)
RBC: 3.73 MIL/uL — ABNORMAL LOW (ref 4.22–5.81)
RDW: 13.5 % (ref 11.5–15.5)
WBC: 8.7 10*3/uL (ref 4.0–10.5)
nRBC: 0 % (ref 0.0–0.2)

## 2019-08-14 LAB — COMPREHENSIVE METABOLIC PANEL
ALT: 36 U/L (ref 0–44)
AST: 32 U/L (ref 15–41)
Albumin: 3.1 g/dL — ABNORMAL LOW (ref 3.5–5.0)
Alkaline Phosphatase: 71 U/L (ref 38–126)
Anion gap: 10 (ref 5–15)
BUN: 20 mg/dL (ref 8–23)
CO2: 26 mmol/L (ref 22–32)
Calcium: 8.5 mg/dL — ABNORMAL LOW (ref 8.9–10.3)
Chloride: 102 mmol/L (ref 98–111)
Creatinine, Ser: 0.71 mg/dL (ref 0.61–1.24)
GFR calc Af Amer: >60 mL/min (ref >60)
GFR calc non Af Amer: >60 mL/min (ref >60)
Glucose, Bld: 145 mg/dL — ABNORMAL HIGH (ref 70–99)
Potassium: 4.2 mmol/L (ref 3.5–5.1)
Sodium: 138 mmol/L (ref 135–145)
Total Bilirubin: 1.1 mg/dL (ref 0.3–1.2)
Total Protein: 6.8 g/dL (ref 6.5–8.1)

## 2019-08-14 LAB — GLUCOSE, CAPILLARY
Glucose-Capillary: 137 mg/dL — ABNORMAL HIGH (ref 70–99)
Glucose-Capillary: 148 mg/dL — ABNORMAL HIGH (ref 70–99)
Glucose-Capillary: 161 mg/dL — ABNORMAL HIGH (ref 70–99)
Glucose-Capillary: 165 mg/dL — ABNORMAL HIGH (ref 70–99)
Glucose-Capillary: 171 mg/dL — ABNORMAL HIGH (ref 70–99)
Glucose-Capillary: 209 mg/dL — ABNORMAL HIGH (ref 70–99)

## 2019-08-14 LAB — MAGNESIUM: Magnesium: 2.4 mg/dL (ref 1.7–2.4)

## 2019-08-14 LAB — C-REACTIVE PROTEIN: CRP: 18.8 mg/dL — ABNORMAL HIGH (ref <1.0)

## 2019-08-14 LAB — PHOSPHORUS: Phosphorus: 4 mg/dL (ref 2.5–4.6)

## 2019-08-14 MED ORDER — SALINE SPRAY 0.65 % NA SOLN
1.0000 | NASAL | Status: DC | PRN
  Administered 2019-08-29 – 2019-09-01 (×3): 1 via NASAL
  Filled 2019-08-14 (×3): qty 44

## 2019-08-14 MED ORDER — FUROSEMIDE 10 MG/ML IJ SOLN
40.0000 mg | Freq: Once | INTRAMUSCULAR | Status: AC
  Administered 2019-08-14: 40 mg via INTRAVENOUS
  Filled 2019-08-14: qty 4

## 2019-08-14 NOTE — Progress Notes (Signed)
PROGRESS NOTE    Vincent [name]  [id number] DOB: 05/15/1951 DOA: 08/06/2019 PCP: Leonard Downing, MD    Brief Narrative:  69 year old gentleman with history of type 2 diabetes, hypertension, metastatic pancreatic cancer on chemotherapy, last chemotherapy on 12/16 admitted on 12/30 with low oxygen level.  Initially requiring 8 L of oxygen.  His oxygen requirements has been fluctuating.  Unable to tolerate mobility with tachypnea and tachycardia.   Assessment & Plan:   Principal Problem:   Pneumonia due to COVID-19 virus Active Problems:   Pancreatic cancer ([place])   Acute respiratory failure with hypoxia ([place])   Type 2 diabetes mellitus (HCC)   HLD (hyperlipidemia)  Acute hypoxemic respiratory failure due to pneumonia from COVID-19 virus: Continue to monitor due to significant symptoms, still on significant oxygen. chest physiotherapy, incentive spirometry, deep breathing exercises, sputum induction, mucolytic's and bronchodilators. Supplemental oxygen to keep saturations more than 85%. Covid directed therapy with , steroids, remains on dexamethasone remdesivir, finished 5 days of therapy actemra, not given due to active chemotherapy.  Side effects outweigh any benefits at this point. antibiotics, not indicated at this time.  His blood counts are negative.  Repeat blood cultures were done negative so far. Due to severity of symptoms, patient will need daily inflammatory markers, chest x-rays, liver function test to monitor and direct COVID-19 therapies. Intermittent Lasix to keep him on the dry side.  Pancreatic cancer: Metastatic pancreatic cancer.  Followed by Dr. Burr Medico with oncology.  Recommended no need to continue Promacta.  Type 2 diabetes: On insulin coverage.  Not on treatment at home.  Stable.  Hyperlipidemia: On statin that he will continue.   DVT prophylaxis: Lovenox subcu Code Status: DNR Family Communication: None.  Patient stated that he is  talking to his wife.  Disposition Plan: Anticipate home after clinical improvement.   Consultants:   None  Procedures:   None  Antimicrobials:  Anti-infectives (From admission, onward)   Start     Dose/Rate Route Frequency Ordered Stop   08/08/19 1000  remdesivir 100 mg in sodium chloride 0.9 % 100 mL IVPB     100 mg 200 mL/hr over 30 Minutes Intravenous Daily 08/07/19 0151 08/11/19 0855   08/07/19 0200  remdesivir 200 mg in sodium chloride 0.9% 250 mL IVPB     200 mg 580 mL/hr over 30 Minutes Intravenous Once 08/07/19 0151 08/07/19 0341         Subjective: Seen and examined.  Able to be comfortable at complete rest.  Getting up to eat breakfast exhausted him and he desatted.  Afebrile.  Subjectively felt better after dose of Lasix.  Objective: Vitals:   08/13/19 2015 08/13/19 2335 08/14/19 0400 08/14/19 0728  BP: 124/77 120/86 108/73 99/74  Pulse: 64 71 81 91  Resp: (!) 23 19 20 20   Temp: (!) 97.3 F (36.3 C) 98.3 F (36.8 C) 98.4 F (36.9 C) 97.6 F (36.4 C)  TempSrc: Oral Oral Oral Oral  SpO2: 96% 90% 95% 90%  Weight:      Height:        Intake/Output Summary (Last 24 hours) at 08/14/2019 1310 Last data filed at 08/14/2019 1241 Gross per 24 hour  Intake --  Output 750 ml  Net -750 ml   Filed Weights   08/06/19 2031  Weight: 105.2 kg    Examination:  General exam: Appears comfortable on very high flow oxygen at rest.  In the morning he was totally exhausted and tachypneic on movement.  Respiratory system:  Clear to auscultation. Respiratory effort normal.  Some conducted airway sounds.  Has a port on his right chest that is nontender. Cardiovascular system: S1 & S2 heard, RRR.  Gastrointestinal system: Abdomen is nondistended, soft and nontender. Central nervous system: Alert and oriented. No focal neurological deficits. Extremities: Symmetric 5 x 5 power. Skin: No rashes, lesions or ulcers Psychiatry: Judgement and insight appear normal. Mood & affect  appropriate.     Data Reviewed: I have personally reviewed following labs and imaging studies  CBC: Recent Labs  Lab 08/10/19 0149 08/11/19 0635 08/12/19 0725 08/13/19 1347 08/14/19 0158  WBC 7.1 8.8 7.2 8.1 8.7  NEUTROABS 5.7 7.3 6.0 7.5 7.5  HGB 11.5* 11.4* 10.8* 12.7* 12.2*  HCT 36.1* 35.8* 33.8* 40.0 37.7*  MCV 103.1* 104.4* 102.4* 102.0* 101.1*  PLT 171 159 154 185 [id number]   Basic Metabolic Panel: Recent Labs  Lab 08/09/19 0729 08/10/19 0149 08/11/19 0635 08/12/19 0725 08/13/19 0500 08/14/19 0158  NA 139 140 140 138  --  138  K 3.9 4.0 4.7 4.1  --  4.2  CL 103 103 102 103  --  102  CO2 27 28 28 25   --  26  GLUCOSE 148* 87 100* 135*  --  145*  BUN 24* 20 18 15   --  20  CREATININE 0.96 0.85 0.82 0.75  --  0.71  CALCIUM 8.5* 8.5* 8.6* 8.3*  --  8.5*  MG 2.3 2.4 2.3 1.8 1.9 2.4  PHOS 3.5 3.6 3.4 2.4* 2.7 4.0   GFR: Estimated Creatinine Clearance: 121 mL/min (by C-G formula based on SCr of 0.71 mg/dL). Liver Function Tests: Recent Labs  Lab 08/09/19 0729 08/10/19 0149 08/11/19 0635 08/12/19 0725 08/14/19 0158  AST 36 36 27 54* 32  ALT 39 39 34 33 36  ALKPHOS 72 73 73 63 71  BILITOT 0.5 0.2* 1.1 0.8 1.1  PROT 6.4* 6.6 6.3* 5.7* 6.8  ALBUMIN 3.1* 3.2* 3.2* 2.8* 3.1*   No results for input(s): LIPASE, AMYLASE in the last 168 hours. No results for input(s): AMMONIA in the last 168 hours. Coagulation Profile: No results for input(s): INR, PROTIME in the last 168 hours. Cardiac Enzymes: No results for input(s): CKTOTAL, CKMB, CKMBINDEX, TROPONINI in the last 168 hours. BNP (last 3 results) No results for input(s): PROBNP in the last 8760 hours. HbA1C: No results for input(s): HGBA1C in the last 72 hours. CBG: Recent Labs  Lab 08/13/19 2022 08/13/19 2334 08/14/19 0359 08/14/19 0755 08/14/19 1148  GLUCAP 178* 171* 137* 148* 161*   Lipid Profile: No results for input(s): CHOL, HDL, LDLCALC, TRIG, CHOLHDL, LDLDIRECT in the last 72 hours. Thyroid  Function Tests: No results for input(s): TSH, T4TOTAL, FREET4, T3FREE, THYROIDAB in the last 72 hours. Anemia Panel: Recent Labs    08/12/19 0725  FERRITIN 484*   Sepsis Labs: No results for input(s): PROCALCITON, LATICACIDVEN in the last 168 hours.  Recent Results (from the past 240 hour(s))  Blood Culture (routine x 2)     Status: None   Collection Time: 08/06/19 10:07 PM   Specimen: BLOOD  Result Value Ref Range Status   Specimen Description   Final    BLOOD RIGHT ANTECUBITAL Performed at [place] [address] [place], [place] [address]    Special Requests   Final    BOTTLES DRAWN AEROBIC AND ANAEROBIC Blood Culture adequate volume Performed at [place] [address] [place], [place] [address]    Culture  Final    NO GROWTH 5 DAYS Performed at [facility], [place] [address], [place], [place] [address]    Report Status 08/12/2019 FINAL  Final  Blood Culture (routine x 2)     Status: None   Collection Time: 08/06/19 10:08 PM   Specimen: BLOOD  Result Value Ref Range Status   Specimen Description   Final    BLOOD UNKNOWN Performed at [facility] [facility] [address], [place], [place] [address]    Special Requests   Final    BOTTLES DRAWN AEROBIC AND ANAEROBIC Blood Culture adequate volume Performed at [place] [address], [place], [place] [address]    Culture   Final    NO GROWTH 5 DAYS Performed at [facility], [place] [address] [place], [place] [address]    Report Status 08/12/2019 FINAL  Final  Culture, blood (routine x 2)     Status: None (Preliminary result)   Collection Time: 08/13/19  1:47 PM   Specimen: BLOOD  Result Value Ref Range Status   Specimen Description   Final    BLOOD RIGHT ARM Performed at [facility] [address], [place], [place] [address]    Special Requests   Final    BOTTLES DRAWN AEROBIC ONLY Blood Culture  adequate volume Performed at [facility] [address] [place], [place] [address]    Culture   Final    NO GROWTH < 24 HOURS Performed at [place] [address], [place], [place] [address]    Report Status PENDING  Incomplete  Culture, blood (routine x 2)     Status: None (Preliminary result)   Collection Time: 08/13/19  2:00 PM   Specimen: BLOOD  Result Value Ref Range Status   Specimen Description   Final    BLOOD RIGHT ARM Performed at [facility] [address] [place], [place] [address]    Special Requests   Final    BOTTLES DRAWN AEROBIC ONLY Blood Culture adequate volume Performed at [facility] [address] [place],  [address]    Culture   Final    NO GROWTH < 24 HOURS Performed at [facility] [address], [place],  [address]    Report Status PENDING  Incomplete         Radiology Studies: DG CHEST PORT 1 VIEW  Result Date: 08/13/2019 CLINICAL DATA:  Pneumonia, COVID EXAM: PORTABLE CHEST 1 VIEW COMPARISON:  08/06/2019 FINDINGS: Right Port-A-Cath remains in place, unchanged. Diffuse right lung airspace disease and patchy left mid and lower lung airspace opacities, new since prior study. Heart is normal size. Low lung volumes. No effusions or acute bony abnormality. IMPRESSION: Diffuse right lung airspace disease and patchy left lung opacities concerning for pneumonia. Electronically Signed   By: Rolm Baptise M.D.   On: 08/13/2019 10:27        Scheduled Meds: . vitamin C  500 mg Oral BID  . aspirin EC  81 mg Oral QPC breakfast  . Chlorhexidine Gluconate Cloth  6 each Topical Daily  . cholecalciferol  2,000 Units Oral Daily  . dexamethasone (DECADRON) injection  6 mg Intravenous Q24H  . enoxaparin (LOVENOX) injection  40 mg Subcutaneous QHS  . gabapentin  300 mg Oral QHS  . insulin aspart  0-15 Units Subcutaneous Q4H  . insulin aspart  4 Units Subcutaneous TID  WC  . simvastatin  20 mg Oral QPM  . zinc sulfate  220 mg Oral Daily   Continuous Infusions:   LOS: 7 days    Time spent: 25 minutes    Barb Merino, MD [facility] Pager [phone]

## 2019-08-15 LAB — COMPREHENSIVE METABOLIC PANEL
ALT: 62 U/L — ABNORMAL HIGH (ref 0–44)
AST: 59 U/L — ABNORMAL HIGH (ref 15–41)
Albumin: 2.8 g/dL — ABNORMAL LOW (ref 3.5–5.0)
Alkaline Phosphatase: 75 U/L (ref 38–126)
Anion gap: 12 (ref 5–15)
BUN: 28 mg/dL — ABNORMAL HIGH (ref 8–23)
CO2: 25 mmol/L (ref 22–32)
Calcium: 8.4 mg/dL — ABNORMAL LOW (ref 8.9–10.3)
Chloride: 102 mmol/L (ref 98–111)
Creatinine, Ser: 0.9 mg/dL (ref 0.61–1.24)
GFR calc Af Amer: >60 mL/min (ref >60)
GFR calc non Af Amer: >60 mL/min (ref >60)
Glucose, Bld: 166 mg/dL — ABNORMAL HIGH (ref 70–99)
Potassium: 4.1 mmol/L (ref 3.5–5.1)
Sodium: 139 mmol/L (ref 135–145)
Total Bilirubin: 0.6 mg/dL (ref 0.3–1.2)
Total Protein: 6.6 g/dL (ref 6.5–8.1)

## 2019-08-15 LAB — CBC WITH DIFFERENTIAL/PLATELET
Abs Immature Granulocytes: 0.16 10*3/uL — ABNORMAL HIGH (ref 0.00–0.07)
Basophils Absolute: 0 10*3/uL (ref 0.0–0.1)
Basophils Relative: 0 %
Eosinophils Absolute: 0 10*3/uL (ref 0.0–0.5)
Eosinophils Relative: 0 %
HCT: 36.5 % — ABNORMAL LOW (ref 39.0–52.0)
Hemoglobin: 11.5 g/dL — ABNORMAL LOW (ref 13.0–17.0)
Immature Granulocytes: 2 %
Lymphocytes Relative: 6 %
Lymphs Abs: 0.5 10*3/uL — ABNORMAL LOW (ref 0.7–4.0)
MCH: 32.4 pg (ref 26.0–34.0)
MCHC: 31.5 g/dL (ref 30.0–36.0)
MCV: 102.8 fL — ABNORMAL HIGH (ref 80.0–100.0)
Monocytes Absolute: 0.5 10*3/uL (ref 0.1–1.0)
Monocytes Relative: 5 %
Neutro Abs: 8.6 10*3/uL — ABNORMAL HIGH (ref 1.7–7.7)
Neutrophils Relative %: 87 %
Platelets: 248 10*3/uL (ref 150–400)
RBC: 3.55 MIL/uL — ABNORMAL LOW (ref 4.22–5.81)
RDW: 13.8 % (ref 11.5–15.5)
WBC: 9.8 10*3/uL (ref 4.0–10.5)
nRBC: 0 % (ref 0.0–0.2)

## 2019-08-15 LAB — GLUCOSE, CAPILLARY
Glucose-Capillary: 102 mg/dL — ABNORMAL HIGH (ref 70–99)
Glucose-Capillary: 105 mg/dL — ABNORMAL HIGH (ref 70–99)
Glucose-Capillary: 173 mg/dL — ABNORMAL HIGH (ref 70–99)
Glucose-Capillary: 176 mg/dL — ABNORMAL HIGH (ref 70–99)
Glucose-Capillary: 276 mg/dL — ABNORMAL HIGH (ref 70–99)
Glucose-Capillary: 320 mg/dL — ABNORMAL HIGH (ref 70–99)

## 2019-08-15 LAB — MAGNESIUM: Magnesium: 2.3 mg/dL (ref 1.7–2.4)

## 2019-08-15 LAB — C-REACTIVE PROTEIN: CRP: 12.8 mg/dL — ABNORMAL HIGH (ref <1.0)

## 2019-08-15 LAB — PHOSPHORUS: Phosphorus: 3.5 mg/dL (ref 2.5–4.6)

## 2019-08-15 MED ORDER — FUROSEMIDE 10 MG/ML IJ SOLN
20.0000 mg | Freq: Once | INTRAMUSCULAR | Status: AC
  Administered 2019-08-15: 20 mg via INTRAVENOUS
  Filled 2019-08-15: qty 2

## 2019-08-15 MED ORDER — ENSURE ENLIVE PO LIQD
237.0000 mL | Freq: Three times a day (TID) | ORAL | Status: DC
  Administered 2019-08-15 – 2019-09-02 (×35): 237 mL via ORAL

## 2019-08-15 NOTE — Progress Notes (Signed)
Initial Nutrition Assessment  DOCUMENTATION CODES:   Not applicable  INTERVENTION:    Recommend scheduled bowel regimen, no BM documented since 1/1.   Ensure Enlive po TID, each supplement provides 350 kcal and 20 grams of protein.  Pt receiving Magic cup BID with lunch and dinner, each supplement provides 290 kcal and 9 grams of protein, automatically on meal trays to optimize nutritional intake.   NUTRITION DIAGNOSIS:   Increased nutrient needs related to acute illness(COVID PNA) as evidenced by estimated needs.  GOAL:   Patient will meet greater than or equal to 90% of their needs  MONITOR:   PO intake, Supplement acceptance, Labs  REASON FOR ASSESSMENT:   Malnutrition Screening Tool    ASSESSMENT:   69 yo male admitted with acute respiratory failure related to COVID-19 PNA. PMH includes DM-2, HTN, HLD, metastatic pancreatic cancer, last chemo 12/16.   Patient is currently on a CHO modified diet consuming 40-80% of meals 1/3 and 1/4. No other documentation of meal intake available.   Currently on high flow oxygen.  Labs reviewed.  CBG's: [phone]  Medications reviewed and include vitamin C, vitamin D3, decadron, novolog, zinc sulfate.   Usual weights reviewed. Weight on admission is higher than usual weight (94-104.6 kg since last August).  No recent weight since admission available for review.   NUTRITION - FOCUSED PHYSICAL EXAM:  unable to complete due to COVID restrictions  Diet Order:   Diet Order            Diet Carb Modified Fluid consistency: Thin; Room service appropriate? Yes  Diet effective now              EDUCATION NEEDS:   Not appropriate for education at this time  Skin:  Skin Assessment: Reviewed RN Assessment  Last BM:  1/1  Height:   Ht Readings from Last 1 Encounters:  08/06/19 6\' 4"  (1.93 m)    Weight:   Wt Readings from Last 1 Encounters:  08/06/19 105.2 kg    Ideal Body Weight:  91.8 kg  BMI:  Body mass  index is 28.24 kg/m.  Estimated Nutritional Needs:   Kcal:  [id number]  Protein:  130-150 gm  Fluid:  >/= 2.5 L    Molli Barrows, RD, LDN, [place] Pager [phone] After Hours Pager [phone]

## 2019-08-15 NOTE — Progress Notes (Signed)
PROGRESS NOTE    Vincent [name]  [id number] DOB: June 14, 1951 DOA: 08/06/2019 PCP: Leonard Downing, MD    Brief Narrative:  69 year old gentleman with history of type 2 diabetes, hypertension, metastatic pancreatic cancer on chemotherapy, last chemotherapy on 12/16 admitted on 12/30 with low oxygen level.  Initially requiring 8 L of oxygen.  His oxygen requirements has been fluctuating.  Unable to tolerate mobility with tachypnea and tachycardia. Remains on high flow oxygen.   Assessment & Plan:   Principal Problem:   Pneumonia due to COVID-19 virus Active Problems:   Pancreatic cancer ([place])   Acute respiratory failure with hypoxia ([place])   Type 2 diabetes mellitus (H[facility])   HLD (hyperlipidemia)  Acute hypoxemic respiratory failure due to pneumonia from COVID-19 virus: Continue to monitor due to significant symptoms, still on significant oxygen. chest physiotherapy, incentive spirometry, deep breathing exercises, sputum induction, mucolytic's and bronchodilators. Supplemental oxygen to keep saturations more than 85%. Covid directed therapy with , steroids, remains on dexamethasone remdesivir, finished 5 days of therapy actemra, not given due to active chemotherapy.  Side effects outweigh any benefits at this point. antibiotics, not indicated at this time.  His blood cultures are repeatedly negative.  Due to severity of symptoms, patient will need daily inflammatory markers, liver function test to monitor and direct COVID-19 therapies. Intermittent Lasix to keep him on the dry side.  We will give additional Lasix today.  Pancreatic cancer: Metastatic pancreatic cancer.  Followed by Dr. Burr Medico with oncology.  Recommended no need to continue Promacta.  Type 2 diabetes: On insulin coverage.  Not on treatment at home.  Stable.  Hyperlipidemia: On statin that he will continue.   DVT prophylaxis: Lovenox subcu Code Status: DNR Family Communication: Patient communicating  with his wife.   Disposition Plan: Anticipate home after clinical improvement.   Consultants:   None  Procedures:   None  Antimicrobials:  Anti-infectives (From admission, onward)   Start     Dose/Rate Route Frequency Ordered Stop   08/08/19 1000  remdesivir 100 mg in sodium chloride 0.9 % 100 mL IVPB     100 mg 200 mL/hr over 30 Minutes Intravenous Daily 08/07/19 0151 08/11/19 0855   08/07/19 0200  remdesivir 200 mg in sodium chloride 0.9% 250 mL IVPB     200 mg 580 mL/hr over 30 Minutes Intravenous Once 08/07/19 0151 08/07/19 0341         Subjective: Seen and examined.  Patient states that he had a comfortable night.  He still remains on high flow oxygen.  Objective: Vitals:   08/15/19 0000 08/15/19 0043 08/15/19 0400 08/15/19 0800  BP: 99/71  102/67 110/72  Pulse: 83  83 97  Resp: (!) 23  (!) 24 (!) 22  Temp: 98.6 F (37 C)  98.8 F (37.1 C) 98.6 F (37 C)  TempSrc: Oral  Axillary Oral  SpO2: 92% 92%  (!) 86%  Weight:      Height:        Intake/Output Summary (Last 24 hours) at 08/15/2019 1118 Last data filed at 08/15/2019 0700 Gross per 24 hour  Intake --  Output 1450 ml  Net -1450 ml   Filed Weights   08/06/19 2031  Weight: 105.2 kg    Examination:  General exam: On high flow oxygen, however remains comfortable at rest.   Respiratory system: Clear to auscultation. Respiratory effort normal.  No added sounds.  Has a port on his right chest that is nontender. Cardiovascular system: S1 & S2  heard, RRR.  Gastrointestinal system: Abdomen is nondistended, soft and nontender. Central nervous system: Alert and oriented. No focal neurological deficits. Extremities: Symmetric 5 x 5 power. Skin: No rashes, lesions or ulcers Psychiatry: Judgement and insight appear normal. Mood & affect appropriate.     Data Reviewed: I have personally reviewed following labs and imaging studies  CBC: Recent Labs  Lab 08/11/19 0635 08/12/19 0725 08/13/19 1347  08/14/19 0158 08/15/19 0020  WBC 8.8 7.2 8.1 8.7 9.8  NEUTROABS 7.3 6.0 7.5 7.5 8.6*  HGB 11.4* 10.8* 12.7* 12.2* 11.5*  HCT 35.8* 33.8* 40.0 37.7* 36.5*  MCV 104.4* 102.4* 102.0* 101.1* 102.8*  PLT 159 154 185 206 [id number]   Basic Metabolic Panel: Recent Labs  Lab 08/10/19 0149 08/11/19 0635 08/12/19 0725 08/13/19 0500 08/14/19 0158 08/15/19 0020  NA 140 140 138  --  138 139  K 4.0 4.7 4.1  --  4.2 4.1  CL 103 102 103  --  102 102  CO2 28 28 25   --  26 25  GLUCOSE 87 100* 135*  --  145* 166*  BUN 20 18 15   --  20 28*  CREATININE 0.85 0.82 0.75  --  0.71 0.90  CALCIUM 8.5* 8.6* 8.3*  --  8.5* 8.4*  MG 2.4 2.3 1.8 1.9 2.4 2.3  PHOS 3.6 3.4 2.4* 2.7 4.0 3.5   GFR: Estimated Creatinine Clearance: 107.6 mL/min (by C-G formula based on SCr of 0.9 mg/dL). Liver Function Tests: Recent Labs  Lab 08/10/19 0149 08/11/19 0635 08/12/19 0725 08/14/19 0158 08/15/19 0020  AST 36 27 54* 32 59*  ALT 39 34 33 36 62*  ALKPHOS 73 73 63 71 75  BILITOT 0.2* 1.1 0.8 1.1 0.6  PROT 6.6 6.3* 5.7* 6.8 6.6  ALBUMIN 3.2* 3.2* 2.8* 3.1* 2.8*   No results for input(s): LIPASE, AMYLASE in the last 168 hours. No results for input(s): AMMONIA in the last 168 hours. Coagulation Profile: No results for input(s): INR, PROTIME in the last 168 hours. Cardiac Enzymes: No results for input(s): CKTOTAL, CKMB, CKMBINDEX, TROPONINI in the last 168 hours. BNP (last 3 results) No results for input(s): PROBNP in the last 8760 hours. HbA1C: No results for input(s): HGBA1C in the last 72 hours. CBG: Recent Labs  Lab 08/14/19 1657 08/14/19 2010 08/15/19 0027 08/15/19 0424 08/15/19 0800  GLUCAP 209* 165* 173* 102* 105*   Lipid Profile: No results for input(s): CHOL, HDL, LDLCALC, TRIG, CHOLHDL, LDLDIRECT in the last 72 hours. Thyroid Function Tests: No results for input(s): TSH, T4TOTAL, FREET4, T3FREE, THYROIDAB in the last 72 hours. Anemia Panel: No results for input(s): VITAMINB12, FOLATE,  FERRITIN, TIBC, IRON, RETICCTPCT in the last 72 hours. Sepsis Labs: No results for input(s): PROCALCITON, LATICACIDVEN in the last 168 hours.  Recent Results (from the past 240 hour(s))  Blood Culture (routine x 2)     Status: None   Collection Time: 08/06/19 10:07 PM   Specimen: BLOOD  Result Value Ref Range Status   Specimen Description   Final    BLOOD RIGHT ANTECUBITAL Performed at [facility] [address], [place], [place] [address]    Special Requests   Final    BOTTLES DRAWN AEROBIC AND ANAEROBIC Blood Culture adequate volume Performed at [facility] [address], [place], [place] [address]    Culture   Final    NO GROWTH 5 DAYS Performed at [facility], [place] [address], [place], [place] [address]  Report Status 08/12/2019 FINAL  Final  Blood Culture (routine x 2)     Status: None   Collection Time: 08/06/19 10:08 PM   Specimen: BLOOD  Result Value Ref Range Status   Specimen Description   Final    BLOOD UNKNOWN Performed at [facility] [address], [place], [place] [address]    Special Requests   Final    BOTTLES DRAWN AEROBIC AND ANAEROBIC Blood Culture adequate volume Performed at [facility] [address] [place], [place] [address]    Culture   Final    NO GROWTH 5 DAYS Performed at [facility], [place] [address], [place], [place] [address]    Report Status 08/12/2019 FINAL  Final  Culture, blood (routine x 2)     Status: None (Preliminary result)   Collection Time: 08/13/19  1:47 PM   Specimen: BLOOD  Result Value Ref Range Status   Specimen Description   Final    BLOOD RIGHT ARM Performed at [facility] [address] [place], [place] [address]    Special Requests   Final    BOTTLES DRAWN AEROBIC ONLY Blood Culture adequate volume Performed at [facility] [address] [place], [place] [address]    Culture    Final    NO GROWTH 2 DAYS Performed at [facility] [address] [place], [place] [address]    Report Status PENDING  Incomplete  Culture, blood (routine x 2)     Status: None (Preliminary result)   Collection Time: 08/13/19  2:00 PM   Specimen: BLOOD  Result Value Ref Range Status   Specimen Description   Final    BLOOD RIGHT ARM Performed at [facility] [address], [place], [place] [address]    Special Requests   Final    BOTTLES DRAWN AEROBIC ONLY Blood Culture adequate volume Performed at [facility] [address] [place], [place] [address]    Culture   Final    NO GROWTH 2 DAYS Performed at [facility] [address], [place],  [address]    Report Status PENDING  Incomplete         Radiology Studies: No results found.      Scheduled Meds: . vitamin C  500 mg Oral BID  . aspirin EC  81 mg Oral QPC breakfast  . Chlorhexidine Gluconate Cloth  6 each Topical Daily  . cholecalciferol  2,000 Units Oral Daily  . dexamethasone (DECADRON) injection  6 mg Intravenous Q24H  . enoxaparin (LOVENOX) injection  40 mg Subcutaneous QHS  . gabapentin  300 mg Oral QHS  . insulin aspart  0-15 Units Subcutaneous Q4H  . insulin aspart  4 Units Subcutaneous TID WC  . simvastatin  20 mg Oral QPM  . zinc sulfate  220 mg Oral Daily   Continuous Infusions:   LOS: 8 days    Time spent: 25 minutes    Barb Merino, MD [facility] Pager [phone]

## 2019-08-16 ENCOUNTER — Inpatient Hospital Stay (HOSPITAL_COMMUNITY): Payer: Medicare Other

## 2019-08-16 LAB — GLUCOSE, CAPILLARY
Glucose-Capillary: 113 mg/dL — ABNORMAL HIGH (ref 70–99)
Glucose-Capillary: 127 mg/dL — ABNORMAL HIGH (ref 70–99)
Glucose-Capillary: 137 mg/dL — ABNORMAL HIGH (ref 70–99)
Glucose-Capillary: 219 mg/dL — ABNORMAL HIGH (ref 70–99)
Glucose-Capillary: 231 mg/dL — ABNORMAL HIGH (ref 70–99)
Glucose-Capillary: 306 mg/dL — ABNORMAL HIGH (ref 70–99)

## 2019-08-16 LAB — CBC WITH DIFFERENTIAL/PLATELET
Abs Immature Granulocytes: 0.04 10*3/uL (ref 0.00–0.07)
Basophils Absolute: 0 10*3/uL (ref 0.0–0.1)
Basophils Relative: 0 %
Eosinophils Absolute: 0.1 10*3/uL (ref 0.0–0.5)
Eosinophils Relative: 1 %
HCT: 37.8 % — ABNORMAL LOW (ref 39.0–52.0)
Hemoglobin: 11.9 g/dL — ABNORMAL LOW (ref 13.0–17.0)
Immature Granulocytes: 0 %
Lymphocytes Relative: 8 %
Lymphs Abs: 0.8 10*3/uL (ref 0.7–4.0)
MCH: 32.1 pg (ref 26.0–34.0)
MCHC: 31.5 g/dL (ref 30.0–36.0)
MCV: 101.9 fL — ABNORMAL HIGH (ref 80.0–100.0)
Monocytes Absolute: 0.7 10*3/uL (ref 0.1–1.0)
Monocytes Relative: 6 %
Neutro Abs: 9.2 10*3/uL — ABNORMAL HIGH (ref 1.7–7.7)
Neutrophils Relative %: 85 %
Platelets: 227 10*3/uL (ref 150–400)
RBC: 3.71 MIL/uL — ABNORMAL LOW (ref 4.22–5.81)
RDW: 13.8 % (ref 11.5–15.5)
WBC: 10.9 10*3/uL — ABNORMAL HIGH (ref 4.0–10.5)
nRBC: 0 % (ref 0.0–0.2)

## 2019-08-16 LAB — PHOSPHORUS: Phosphorus: 3.6 mg/dL (ref 2.5–4.6)

## 2019-08-16 LAB — MAGNESIUM: Magnesium: 2.1 mg/dL (ref 1.7–2.4)

## 2019-08-16 LAB — D-DIMER, QUANTITATIVE: D-Dimer, Quant: 2.33 ug/mL-FEU — ABNORMAL HIGH (ref 0.00–0.50)

## 2019-08-16 LAB — C-REACTIVE PROTEIN: CRP: 10.7 mg/dL — ABNORMAL HIGH (ref <1.0)

## 2019-08-16 LAB — COMPREHENSIVE METABOLIC PANEL
ALT: 67 U/L — ABNORMAL HIGH (ref 0–44)
AST: 45 U/L — ABNORMAL HIGH (ref 15–41)
Albumin: 3 g/dL — ABNORMAL LOW (ref 3.5–5.0)
Alkaline Phosphatase: 71 U/L (ref 38–126)
Anion gap: 12 (ref 5–15)
BUN: 27 mg/dL — ABNORMAL HIGH (ref 8–23)
CO2: 27 mmol/L (ref 22–32)
Calcium: 8.6 mg/dL — ABNORMAL LOW (ref 8.9–10.3)
Chloride: 103 mmol/L (ref 98–111)
Creatinine, Ser: 0.81 mg/dL (ref 0.61–1.24)
GFR calc Af Amer: >60 mL/min (ref >60)
GFR calc non Af Amer: >60 mL/min (ref >60)
Glucose, Bld: 106 mg/dL — ABNORMAL HIGH (ref 70–99)
Potassium: 3.9 mmol/L (ref 3.5–5.1)
Sodium: 142 mmol/L (ref 135–145)
Total Bilirubin: 0.9 mg/dL (ref 0.3–1.2)
Total Protein: 6.7 g/dL (ref 6.5–8.1)

## 2019-08-16 MED ORDER — VANCOMYCIN HCL 1750 MG/350ML IV SOLN
1750.0000 mg | Freq: Two times a day (BID) | INTRAVENOUS | Status: AC
  Administered 2019-08-16 – 2019-08-20 (×8): 1750 mg via INTRAVENOUS
  Filled 2019-08-16 (×11): qty 350

## 2019-08-16 MED ORDER — VANCOMYCIN HCL 2000 MG/400ML IV SOLN
2000.0000 mg | Freq: Once | INTRAVENOUS | Status: AC
  Administered 2019-08-16: 2000 mg via INTRAVENOUS
  Filled 2019-08-16: qty 400

## 2019-08-16 MED ORDER — FUROSEMIDE 10 MG/ML IJ SOLN: 20.0000 mg | Freq: Once | INTRAMUSCULAR | Status: DC

## 2019-08-16 MED ORDER — SODIUM CHLORIDE 0.9 % IV SOLN
2.0000 g | Freq: Three times a day (TID) | INTRAVENOUS | Status: AC
  Administered 2019-08-16 – 2019-08-20 (×14): 2 g via INTRAVENOUS
  Filled 2019-08-16 (×14): qty 2

## 2019-08-16 MED ORDER — IOHEXOL 350 MG/ML SOLN
75.0000 mL | Freq: Once | INTRAVENOUS | Status: AC | PRN
  Administered 2019-08-16: 09:00:00 75 mL via INTRAVENOUS

## 2019-08-16 NOTE — Progress Notes (Signed)
Pharmacy Antibiotic Note  Vincent [name] is a 69 y.o. male admitted on 08/06/2019 with COVID-19.  Of note, patient has metastatic pancreatic cancer and is on chemotherapy PTA. Pharmacy has been consulted for vancomycin and cefepime dosing for PNA. Renal function is normal, WBC increased to 10.5, and afebrile.  Plan: Vancomycin 2000 mg IV load then 1750 mg IV q12h Goal AUC 400-550. Expected AUC: 482.3 SCr used: 0.81 Cefepime 2 g IV q8h Monitor renal function, clinical progress, cultures/sensitivities F/U LOT and de-escalate as able Vancomycin levels as clinically indicated   Height: 6\' 4"  (193 cm) Weight: 232 lb (105.2 kg) IBW/kg (Calculated) : 86.8  Temp (24hrs), Avg:98.3 F (36.8 C), Min:97.7 F (36.5 C), Max:98.7 F (37.1 C)  Recent Labs  Lab 08/11/19 0635 08/12/19 0725 08/13/19 1347 08/14/19 0158 08/15/19 0020 08/16/19 0330  WBC 8.8 7.2 8.1 8.7 9.8 10.9*  CREATININE 0.82 0.75  --  0.71 0.90 0.81    Estimated Creatinine Clearance: 119.5 mL/min (by C-G formula based on SCr of 0.81 mg/dL).    No Known Allergies  Antimicrobials this admission: Remdesivir 12/31>1/4 Vancomycin 1/9>> Cefepime 1/9>>  Dose adjustments this admission:   Microbiology results: 1/6 BCx: ngtd  Thank you for involving pharmacy in this patient's care.  Renold Genta, PharmD, BCPS Clinical Pharmacist Clinical phone for 08/16/2019 until 3:30p is [phone] 08/16/2019 9:52 AM  **Pharmacist phone directory can be found on [email or web address] listed under [place]**

## 2019-08-16 NOTE — Plan of Care (Signed)
Pt had great improvement today. Vincent [name] was able to ambulate in the hallway approx. 47ft. Current HFNC on 6L with NR 15L O2 sat 92-94%. Pt also had improvement with appetite. Pt currently resting in room eyes open breathing normal with no s/s of distress noted. Will continue monitor.  Problem: Education: Goal: Knowledge of risk factors and measures for prevention of condition will improve Outcome: Progressing   Problem: Coping: Goal: Psychosocial and spiritual needs will be supported Outcome: Progressing   Problem: Respiratory: Goal: Will maintain a patent airway Outcome: Progressing Goal: Complications related to the disease process, condition or treatment will be avoided or minimized Outcome: Progressing   Problem: Education: Goal: Knowledge of General Education information will improve Description: Including pain rating scale, medication(s)/side effects and non-pharmacologic comfort measures Outcome: Progressing

## 2019-08-16 NOTE — Progress Notes (Signed)
PROGRESS NOTE    Vincent [name]  [id number] DOB: [birth year] DOA: 08/06/2019 PCP: Leonard Downing, MD    Brief Narrative:  69 year old gentleman with history of type 2 diabetes, hypertension, metastatic pancreatic cancer on chemotherapy, last chemotherapy on 12/16 admitted on 12/30 with low oxygen level.  Initially requiring 8 L of oxygen.  His oxygen requirements has been fluctuating.  Unable to tolerate mobility with tachypnea and tachycardia. Remains on high flow oxygen.   Assessment & Plan:   Principal Problem:   Pneumonia due to COVID-19 virus Active Problems:   Pancreatic cancer ([place])   Acute respiratory failure with hypoxia ([place])   Type 2 diabetes mellitus (H[facility])   HLD (hyperlipidemia)  Acute hypoxemic respiratory failure due to pneumonia from COVID-19 virus: Continue to monitor due to significant symptoms, still on significant oxygen. chest physiotherapy, incentive spirometry, deep breathing exercises, sputum induction, mucolytic's and bronchodilators. Supplemental oxygen to keep saturations more than 85%. Covid directed therapy with , steroids, he is finishing dexamethasone therapy today. remdesivir, finished 5 days of therapy actemra, not given due to active chemotherapy.  Side effects outweigh any benefits at this point. Remains persistently hypoxic. CTA today shows more confluent pneumonia, negative for PE. Due to persistent hypoxia and poor improvement of respiratory status, will add broad-spectrum antibiotics empirically.  Pancreatic cancer: Metastatic pancreatic cancer.  Followed by Dr. Burr Medico with oncology.  Recommended no need to continue Promacta.  Type 2 diabetes: On insulin coverage.  Not on treatment at home.  Stable.  Hyperlipidemia: On statin that he will continue.   DVT prophylaxis: Lovenox subcu Code Status: DNR Family Communication: Patient communicating with his wife.   Disposition Plan: Anticipate home after clinical  improvement.   Consultants:   None  Procedures:   None  Antimicrobials:  Anti-infectives (From admission, onward)   Start     Dose/Rate Route Frequency Ordered Stop   08/16/19 2300  vancomycin (VANCOREADY) IVPB 1750 mg/350 mL     1,750 mg 175 mL/hr over 120 Minutes Intravenous Every 12 hours 08/16/19 1011     08/16/19 1100  vancomycin (VANCOREADY) IVPB 2000 mg/400 mL     2,000 mg 200 mL/hr over 120 Minutes Intravenous  Once 08/16/19 0953 08/16/19 1400   08/16/19 1030  ceFEPIme (MAXIPIME) 2 g in sodium chloride 0.9 % 100 mL IVPB     2 g 200 mL/hr over 30 Minutes Intravenous Every 8 hours 08/16/19 0953     08/08/19 1000  remdesivir 100 mg in sodium chloride 0.9 % 100 mL IVPB     100 mg 200 mL/hr over 30 Minutes Intravenous Daily 08/07/19 0151 08/11/19 0855   08/07/19 0200  remdesivir 200 mg in sodium chloride 0.9% 250 mL IVPB     200 mg 580 mL/hr over 30 Minutes Intravenous Once 08/07/19 0151 08/07/19 0341         Subjective: Patient seen and examined.  Overnight he could sleep well.  Breathing remains about the same.  He feels good getting outside the room to go to CAT scan.  Afebrile.  Still on 12 to 15 L of oxygen.  Objective: Vitals:   08/16/19 0335 08/16/19 0336 08/16/19 1150 08/16/19 1425  BP: 114/74     Pulse: 92   98  Resp: (!) 26 (!) 23 (!) 23 (!) 24  Temp: 98.7 F (37.1 C)  99.9 F (37.7 C)   TempSrc: Oral  Axillary   SpO2: (!) 86% 90% 95% 96%  Weight:      Height:  Intake/Output Summary (Last 24 hours) at 08/16/2019 1508 Last data filed at 08/16/2019 0800 Gross per 24 hour  Intake 240 ml  Output 825 ml  Net -585 ml   Filed Weights   08/06/19 2031  Weight: 105.2 kg    Examination: Physical Exam  Constitutional: He is oriented to person, place, and time and well-developed, well-nourished, and in no distress. No distress.  Is on high flow oxygen but not in any distress.  HENT:  Head: Normocephalic.  Eyes: Pupils are equal, round, and  reactive to light.  Cardiovascular: Normal rate and regular rhythm.  Pulmonary/Chest: Breath sounds normal. No respiratory distress.  Abdominal: Bowel sounds are normal.  Musculoskeletal:        General: No edema.     Cervical back: Neck supple.  Neurological: He is alert and oriented to person, place, and time.      Data Reviewed: I have personally reviewed following labs and imaging studies  CBC: Recent Labs  Lab 08/12/19 0725 08/13/19 1347 08/14/19 0158 08/15/19 0020 08/16/19 0330  WBC 7.2 8.1 8.7 9.8 10.9*  NEUTROABS 6.0 7.5 7.5 8.6* 9.2*  HGB 10.8* 12.7* 12.2* 11.5* 11.9*  HCT 33.8* 40.0 37.7* 36.5* 37.8*  MCV 102.4* 102.0* 101.1* 102.8* 101.9*  PLT 154 185 206 248 Q000111Q   Basic Metabolic Panel: Recent Labs  Lab 08/11/19 0635 08/12/19 0725 08/13/19 0500 08/14/19 0158 08/15/19 0020 08/16/19 0330  NA 140 138  --  138 139 142  K 4.7 4.1  --  4.2 4.1 3.9  CL 102 103  --  102 102 103  CO2 28 25  --  26 25 27   GLUCOSE 100* 135*  --  145* 166* 106*  BUN 18 15  --  20 28* 27*  CREATININE 0.82 0.75  --  0.71 0.90 0.81  CALCIUM 8.6* 8.3*  --  8.5* 8.4* 8.6*  MG 2.3 1.8 1.9 2.4 2.3 2.1  PHOS 3.4 2.4* 2.7 4.0 3.5 3.6   GFR: Estimated Creatinine Clearance: 119.5 mL/min (by C-G formula based on SCr of 0.81 mg/dL). Liver Function Tests: Recent Labs  Lab 08/11/19 0635 08/12/19 0725 08/14/19 0158 08/15/19 0020 08/16/19 0330  AST 27 54* 32 59* 45*  ALT 34 33 36 62* 67*  ALKPHOS 73 63 71 75 71  BILITOT 1.1 0.8 1.1 0.6 0.9  PROT 6.3* 5.7* 6.8 6.6 6.7  ALBUMIN 3.2* 2.8* 3.1* 2.8* 3.0*   No results for input(s): LIPASE, AMYLASE in the last 168 hours. No results for input(s): AMMONIA in the last 168 hours. Coagulation Profile: No results for input(s): INR, PROTIME in the last 168 hours. Cardiac Enzymes: No results for input(s): CKTOTAL, CKMB, CKMBINDEX, TROPONINI in the last 168 hours. BNP (last 3 results) No results for input(s): PROBNP in the last 8760  hours. HbA1C: No results for input(s): HGBA1C in the last 72 hours. CBG: Recent Labs  Lab 08/15/19 1924 08/15/19 2348 08/16/19 0334 08/16/19 0754 08/16/19 1151  GLUCAP 176* 137* 113* 127* 306*   Lipid Profile: No results for input(s): CHOL, HDL, LDLCALC, TRIG, CHOLHDL, LDLDIRECT in the last 72 hours. Thyroid Function Tests: No results for input(s): TSH, T4TOTAL, FREET4, T3FREE, THYROIDAB in the last 72 hours. Anemia Panel: No results for input(s): VITAMINB12, FOLATE, FERRITIN, TIBC, IRON, RETICCTPCT in the last 72 hours. Sepsis Labs: No results for input(s): PROCALCITON, LATICACIDVEN in the last 168 hours.  Recent Results (from the past 240 hour(s))  Blood Culture (routine x 2)     Status: None  Collection Time: 08/06/19 10:07 PM   Specimen: BLOOD  Result Value Ref Range Status   Specimen Description   Final    BLOOD RIGHT ANTECUBITAL Performed at [facility] [address] [place], [place] [address]    Special Requests   Final    BOTTLES DRAWN AEROBIC AND ANAEROBIC Blood Culture adequate volume Performed at [facility] [address], [place], [place] [address]    Culture   Final    NO GROWTH 5 DAYS Performed at [facility], [place] [address], [place], [place] [address]    Report Status 08/12/2019 FINAL  Final  Blood Culture (routine x 2)     Status: None   Collection Time: 08/06/19 10:08 PM   Specimen: BLOOD  Result Value Ref Range Status   Specimen Description   Final    BLOOD UNKNOWN Performed at [facility] [address], [place], [place] [address]    Special Requests   Final    BOTTLES DRAWN AEROBIC AND ANAEROBIC Blood Culture adequate volume Performed at [facility] [address] [place], [place] [address]    Culture   Final    NO GROWTH 5 DAYS Performed at [facility], [place] [address], [place], [place] [address]    Report Status 08/12/2019 FINAL  Final   Culture, blood (routine x 2)     Status: None (Preliminary result)   Collection Time: 08/13/19  1:47 PM   Specimen: BLOOD  Result Value Ref Range Status   Specimen Description   Final    BLOOD RIGHT ARM Performed at [facility] [address], [place], [place] [address]    Special Requests   Final    BOTTLES DRAWN AEROBIC ONLY Blood Culture adequate volume Performed at [facility] [address], [place], [place] [address]    Culture   Final    NO GROWTH 3 DAYS Performed at [facility], [place] [address] [place], [place] [address]    Report Status PENDING  Incomplete  Culture, blood (routine x 2)     Status: None (Preliminary result)   Collection Time: 08/13/19  2:00 PM   Specimen: BLOOD  Result Value Ref Range Status   Specimen Description   Final    BLOOD RIGHT ARM Performed at [facility] [address], [place], [place] [address]    Special Requests   Final    BOTTLES DRAWN AEROBIC ONLY Blood Culture adequate volume Performed at [facility] [address], [place], [place] [address]    Culture   Final    NO GROWTH 3 DAYS Performed at [facility], [place] [address], [place], [place] [address]    Report Status PENDING  Incomplete         Radiology Studies: CT ANGIO CHEST PE W OR WO CONTRAST  Result Date: 08/16/2019 CLINICAL DATA:  Shortness of breath. History of pancreatic carcinoma EXAM: CT ANGIOGRAPHY CHEST WITH CONTRAST TECHNIQUE: Multidetector CT imaging of the chest was performed using the standard protocol during bolus administration of intravenous contrast. Multiplanar CT image reconstructions and MIPs were obtained to evaluate the vascular anatomy. CONTRAST:  6mL OMNIPAQUE IOHEXOL 350 MG/ML SOLN COMPARISON:  August 07, 2019 CT angiogram chest; chest radiograph August 13, 2019 FINDINGS: Cardiovascular: There is no demonstrable pulmonary embolus. No thoracic aortic  aneurysm or dissection. Visualized great vessels appear unremarkable. There is no appreciable pericardial  effusion or pericardial thickening. There are occasional foci of coronary artery calcification. Port-A-Cath tip in superior vena cava. Mediastinum/Nodes: Thyroid appears normal. There are scattered subcentimeter mediastinal lymph nodes without adenopathy by size criteria evident. No esophageal lesions are appreciable by CT. Lungs/Pleura: There remains ground-glass opacity throughout the lungs diffusely, slightly progressed overall compared to the previous study. There are varying degrees of ground-glass opacity in virtually all lobes in segments. There may well be a degree of underlying small airways obstructive disease given an underlying somewhat mosaic appearance throughout the lungs. No pleural effusions are evident. Upper Abdomen: There is again noted a mass in the right lobe of the liver measuring 5.9 x 5.6 cm. Visualized upper abdominal structures are otherwise unremarkable in appearance Musculoskeletal: There is degenerative change in the thoracic spine with diffuse idiopathic skeletal hyperostosis. No blastic or lytic bone lesions are evident. No chest wall lesions are appreciable. Review of the MIP images confirms the above findings. IMPRESSION: 1. No demonstrable pulmonary embolus. No thoracic aortic aneurysm or dissection. Mild coronary artery calcification. 2. Widespread ground-glass type opacity consistent with multifocal pneumonia, likely of atypical organism etiology. Suspect underlying fairly diffuse small airways obstructive disease, characterized by mosaic attenuation of the lung parenchyma at multiple sites. 3.  No evident adenopathy. 4.  Suspected metastasis in the anterior segment right lobe liver. 5.  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 Electronically Signed   By: Lowella Grip III M.D.   On: 08/16/2019 09:20        Scheduled Meds: . vitamin C  500  mg Oral BID  . aspirin EC  81 mg Oral QPC breakfast  . Chlorhexidine Gluconate Cloth  6 each Topical Daily  . cholecalciferol  2,000 Units Oral Daily  . enoxaparin (LOVENOX) injection  40 mg Subcutaneous QHS  . feeding supplement (ENSURE ENLIVE)  237 mL Oral TID BM  . gabapentin  300 mg Oral QHS  . insulin aspart  0-15 Units Subcutaneous Q4H  . insulin aspart  4 Units Subcutaneous TID WC  . simvastatin  20 mg Oral QPM  . zinc sulfate  220 mg Oral Daily   Continuous Infusions: . ceFEPime (MAXIPIME) IV 2 g (08/16/19 1100)  . vancomycin       LOS: 9 days    Time spent: 25 minutes    Barb Merino, MD [facility] Pager [phone]

## 2019-08-17 ENCOUNTER — Other Ambulatory Visit: Payer: Self-pay

## 2019-08-17 LAB — CBC WITH DIFFERENTIAL/PLATELET
Abs Immature Granulocytes: 0.05 10*3/uL (ref 0.00–0.07)
Basophils Absolute: 0 10*3/uL (ref 0.0–0.1)
Basophils Relative: 0 %
Eosinophils Absolute: 0.2 10*3/uL (ref 0.0–0.5)
Eosinophils Relative: 1 %
HCT: 36.4 % — ABNORMAL LOW (ref 39.0–52.0)
Hemoglobin: 11.5 g/dL — ABNORMAL LOW (ref 13.0–17.0)
Immature Granulocytes: 0 %
Lymphocytes Relative: 7 %
Lymphs Abs: 0.9 10*3/uL (ref 0.7–4.0)
MCH: 32.3 pg (ref 26.0–34.0)
MCHC: 31.6 g/dL (ref 30.0–36.0)
MCV: 102.2 fL — ABNORMAL HIGH (ref 80.0–100.0)
Monocytes Absolute: 0.7 10*3/uL (ref 0.1–1.0)
Monocytes Relative: 6 %
Neutro Abs: 11.5 10*3/uL — ABNORMAL HIGH (ref 1.7–7.7)
Neutrophils Relative %: 86 %
Platelets: 215 10*3/uL (ref 150–400)
RBC: 3.56 MIL/uL — ABNORMAL LOW (ref 4.22–5.81)
RDW: 13.8 % (ref 11.5–15.5)
WBC: 13.3 10*3/uL — ABNORMAL HIGH (ref 4.0–10.5)
nRBC: 0 % (ref 0.0–0.2)

## 2019-08-17 LAB — GLUCOSE, CAPILLARY
Glucose-Capillary: 209 mg/dL — ABNORMAL HIGH (ref 70–99)
Glucose-Capillary: 184 mg/dL — ABNORMAL HIGH (ref 70–99)
Glucose-Capillary: 256 mg/dL — ABNORMAL HIGH (ref 70–99)
Glucose-Capillary: 136 mg/dL — ABNORMAL HIGH (ref 70–99)
Glucose-Capillary: 113 mg/dL — ABNORMAL HIGH (ref 70–99)

## 2019-08-17 LAB — C-REACTIVE PROTEIN: CRP: 10.6 mg/dL — ABNORMAL HIGH (ref <1.0)

## 2019-08-17 LAB — MAGNESIUM: Magnesium: 2 mg/dL (ref 1.7–2.4)

## 2019-08-17 LAB — PHOSPHORUS: Phosphorus: 3.5 mg/dL (ref 2.5–4.6)

## 2019-08-17 LAB — D-DIMER, QUANTITATIVE: D-Dimer, Quant: 2.76 ug/mL-FEU — ABNORMAL HIGH (ref 0.00–0.50)

## 2019-08-17 MED ORDER — SODIUM CHLORIDE 0.9% FLUSH: 10.0000 mL | INTRAVENOUS | Status: DC | PRN

## 2019-08-17 NOTE — Plan of Care (Signed)
  Problem: Education: Goal: Knowledge of risk factors and measures for prevention of condition will improve Outcome: Progressing   Problem: Coping: Goal: Psychosocial and spiritual needs will be supported Outcome: Progressing   Problem: Respiratory: Goal: Will maintain a patent airway Outcome: Progressing Goal: Complications related to the disease process, condition or treatment will be avoided or minimized Outcome: Progressing   Problem: Education: Goal: Knowledge of General Education information will improve Description: Including pain rating scale, medication(s)/side effects and non-pharmacologic comfort measures Outcome: Progressing   Problem: Health Behavior/Discharge Planning: Goal: Ability to manage health-related needs will improve Outcome: Progressing   Problem: Clinical Measurements: Goal: Ability to maintain clinical measurements within normal limits will improve Outcome: Progressing Goal: Will remain free from infection Outcome: Progressing Goal: Diagnostic test results will improve Outcome: Progressing Goal: Respiratory complications will improve Outcome: Progressing Goal: Cardiovascular complication will be avoided Outcome: Progressing   

## 2019-08-17 NOTE — Progress Notes (Signed)
PROGRESS NOTE    Vincent [name]  [id number] DOB: Nov 29, 1950 DOA: 08/06/2019 PCP: Leonard Downing, MD    Brief Narrative:  69 year old gentleman with history of type 2 diabetes, hypertension, metastatic pancreatic cancer on chemotherapy, last chemotherapy on 12/16 admitted on 12/30 with low oxygen level.  Initially requiring 8 L of oxygen.  His oxygen requirements has been fluctuating.  Unable to tolerate mobility with tachypnea and tachycardia. Remains on high flow oxygen. 08/16/2019: Continues to require high flow oxygen, added broad-spectrum IV antibiotics.   Assessment & Plan:   Principal Problem:   Pneumonia due to COVID-19 virus Active Problems:   Pancreatic cancer ([place])   Acute respiratory failure with hypoxia ([place])   Type 2 diabetes mellitus (HCC)   HLD (hyperlipidemia)  Acute hypoxemic respiratory failure due to pneumonia from COVID-19 virus: Continue to monitor due to significant symptoms, still on significant oxygen. chest physiotherapy, incentive spirometry, deep breathing exercises, sputum induction, mucolytic's and bronchodilators. Supplemental oxygen to keep saturations more than 85%. Covid directed therapy with , steroids, finish the steroid therapy. remdesivir, finished 5 days of therapy actemra, not given due to active chemotherapy.  Side effects outweigh any benefits at this point. Remains persistently hypoxic. CTA today shows more confluent pneumonia, negative for PE. Due to persistent hypoxia and poor improvement of respiratory status, will add broad-spectrum antibiotics empirically.  Pancreatic cancer: Metastatic pancreatic cancer.  Followed by Dr. Burr Medico with oncology.  Recommended no need to continue Promacta.  Type 2 diabetes: On insulin coverage.  Not on treatment at home.  Stable.  Hyperlipidemia: On statin that he will continue.   DVT prophylaxis: Lovenox subcu Code Status: DNR Family Communication: Patient communicating with his  wife.   Disposition Plan: Anticipate home after clinical improvement.   Consultants:   None  Procedures:   None  Antimicrobials:  Anti-infectives (From admission, onward)   Start     Dose/Rate Route Frequency Ordered Stop   08/16/19 2300  vancomycin (VANCOREADY) IVPB 1750 mg/350 mL     1,750 mg 175 mL/hr over 120 Minutes Intravenous Every 12 hours 08/16/19 1011     08/16/19 1100  vancomycin (VANCOREADY) IVPB 2000 mg/400 mL     2,000 mg 200 mL/hr over 120 Minutes Intravenous  Once 08/16/19 0953 08/16/19 1400   08/16/19 1030  ceFEPIme (MAXIPIME) 2 g in sodium chloride 0.9 % 100 mL IVPB     2 g 200 mL/hr over 30 Minutes Intravenous Every 8 hours 08/16/19 0953     08/08/19 1000  remdesivir 100 mg in sodium chloride 0.9 % 100 mL IVPB     100 mg 200 mL/hr over 30 Minutes Intravenous Daily 08/07/19 0151 08/11/19 0855   08/07/19 0200  remdesivir 200 mg in sodium chloride 0.9% 250 mL IVPB     200 mg 580 mL/hr over 30 Minutes Intravenous Once 08/07/19 0151 08/07/19 0341         Subjective: Seen and examined.  No overnight events.  Still remains on significant oxygen, he was able to come down on oxygen and walk in the hallway yesterday on 6 to 10 L of oxygen that make him feel better.  Remains afebrile.  Objective: Vitals:   08/17/19 0350 08/17/19 0815 08/17/19 0830 08/17/19 1015  BP: 107/78 117/80    Pulse: 96 (!) 110 (!) 109   Resp: (!) 26 (!) 21 (!) 21   Temp: 99.1 F (37.3 C) 99.1 F (37.3 C)    TempSrc: Oral Oral    SpO2: (!) 88%  91% 91% 93%  Weight:      Height:        Intake/Output Summary (Last 24 hours) at 08/17/2019 1117 Last data filed at 08/17/2019 1016 Gross per 24 hour  Intake 2240 ml  Output 1375 ml  Net 865 ml   Filed Weights   08/06/19 2031  Weight: 105.2 kg    Examination: Physical Exam  Constitutional: He is oriented to person, place, and time and well-developed, well-nourished, and in no distress. No distress.  On 10 L of oxygen.  Comfortable  at rest.  HENT:  Head: Normocephalic.  Eyes: Pupils are equal, round, and reactive to light.  Cardiovascular: Normal rate and regular rhythm.  Pulmonary/Chest: Breath sounds normal. No respiratory distress.  Abdominal: Bowel sounds are normal.  Musculoskeletal:        General: No edema.     Cervical back: Neck supple.  Neurological: He is alert and oriented to person, place, and time.      Data Reviewed: I have personally reviewed following labs and imaging studies  CBC: Recent Labs  Lab 08/13/19 1347 08/14/19 0158 08/15/19 0020 08/16/19 0330 08/17/19 0635  WBC 8.1 8.7 9.8 10.9* 13.3*  NEUTROABS 7.5 7.5 8.6* 9.2* 11.5*  HGB 12.7* 12.2* 11.5* 11.9* 11.5*  HCT 40.0 37.7* 36.5* 37.8* 36.4*  MCV 102.0* 101.1* 102.8* 101.9* 102.2*  PLT 185 206 248 227 [id number]   Basic Metabolic Panel: Recent Labs  Lab 08/11/19 0635 08/12/19 0725 08/13/19 0500 08/14/19 0158 08/15/19 0020 08/16/19 0330 08/17/19 0635  NA 140 138  --  138 139 142  --   K 4.7 4.1  --  4.2 4.1 3.9  --   CL 102 103  --  102 102 103  --   CO2 28 25  --  26 25 27   --   GLUCOSE 100* 135*  --  145* 166* 106*  --   BUN 18 15  --  20 28* 27*  --   CREATININE 0.82 0.75  --  0.71 0.90 0.81  --   CALCIUM 8.6* 8.3*  --  8.5* 8.4* 8.6*  --   MG 2.3 1.8 1.9 2.4 2.3 2.1 2.0  PHOS 3.4 2.4* 2.7 4.0 3.5 3.6 3.5   GFR: Estimated Creatinine Clearance: 119.5 mL/min (by C-G formula based on SCr of 0.81 mg/dL). Liver Function Tests: Recent Labs  Lab 08/11/19 0635 08/12/19 0725 08/14/19 0158 08/15/19 0020 08/16/19 0330  AST 27 54* 32 59* 45*  ALT 34 33 36 62* 67*  ALKPHOS 73 63 71 75 71  BILITOT 1.1 0.8 1.1 0.6 0.9  PROT 6.3* 5.7* 6.8 6.6 6.7  ALBUMIN 3.2* 2.8* 3.1* 2.8* 3.0*   No results for input(s): LIPASE, AMYLASE in the last 168 hours. No results for input(s): AMMONIA in the last 168 hours. Coagulation Profile: No results for input(s): INR, PROTIME in the last 168 hours. Cardiac Enzymes: No results for  input(s): CKTOTAL, CKMB, CKMBINDEX, TROPONINI in the last 168 hours. BNP (last 3 results) No results for input(s): PROBNP in the last 8760 hours. HbA1C: No results for input(s): HGBA1C in the last 72 hours. CBG: Recent Labs  Lab 08/16/19 1151 08/16/19 1612 08/16/19 1920 08/16/19 2347 08/17/19 0814  GLUCAP 306* 231* 219* 209* 184*   Lipid Profile: No results for input(s): CHOL, HDL, LDLCALC, TRIG, CHOLHDL, LDLDIRECT in the last 72 hours. Thyroid Function Tests: No results for input(s): TSH, T4TOTAL, FREET4, T3FREE, THYROIDAB in the last 72 hours. Anemia Panel: No results for input(s):  VITAMINB12, FOLATE, FERRITIN, TIBC, IRON, RETICCTPCT in the last 72 hours. Sepsis Labs: No results for input(s): PROCALCITON, LATICACIDVEN in the last 168 hours.  Recent Results (from the past 240 hour(s))  Culture, blood (routine x 2)     Status: None (Preliminary result)   Collection Time: 08/13/19  1:47 PM   Specimen: BLOOD  Result Value Ref Range Status   Specimen Description   Final    BLOOD RIGHT ARM Performed at [facility] [address] [place], [place] [address]    Special Requests   Final    BOTTLES DRAWN AEROBIC ONLY Blood Culture adequate volume Performed at [facility] [address], [place], [place] [address]    Culture   Final    NO GROWTH 3 DAYS Performed at [facility], [place] [address], [place], [place] [address]    Report Status PENDING  Incomplete  Culture, blood (routine x 2)     Status: None (Preliminary result)   Collection Time: 08/13/19  2:00 PM   Specimen: BLOOD  Result Value Ref Range Status   Specimen Description   Final    BLOOD RIGHT ARM Performed at [facility] [address], [place], [place] [address]    Special Requests   Final    BOTTLES DRAWN AEROBIC ONLY Blood Culture adequate volume Performed at [facility] [address], [place], [place] [address]     Culture   Final    NO GROWTH 3 DAYS Performed at [facility], [place] [address] [place], [place] [address]    Report Status PENDING  Incomplete         Radiology Studies: CT ANGIO CHEST PE W OR WO CONTRAST  Result Date: 08/16/2019 CLINICAL DATA:  Shortness of breath. History of pancreatic carcinoma EXAM: CT ANGIOGRAPHY CHEST WITH CONTRAST TECHNIQUE: Multidetector CT imaging of the chest was performed using the standard protocol during bolus administration of intravenous contrast. Multiplanar CT image reconstructions and MIPs were obtained to evaluate the vascular anatomy. CONTRAST:  66mL OMNIPAQUE IOHEXOL 350 MG/ML SOLN COMPARISON:  August 07, 2019 CT angiogram chest; chest radiograph August 13, 2019 FINDINGS: Cardiovascular: There is no demonstrable pulmonary embolus. No thoracic aortic aneurysm or dissection. Visualized great vessels appear unremarkable. There is no appreciable pericardial effusion or pericardial thickening. There are occasional foci of coronary artery calcification. Port-A-Cath tip in superior vena cava. Mediastinum/Nodes: Thyroid appears normal. There are scattered subcentimeter mediastinal lymph nodes without adenopathy by size criteria evident. No esophageal lesions are appreciable by CT. Lungs/Pleura: There remains ground-glass opacity throughout the lungs diffusely, slightly progressed overall compared to the previous study. There are varying degrees of ground-glass opacity in virtually all lobes in segments. There may well be a degree of underlying small airways obstructive disease given an underlying somewhat mosaic appearance throughout the lungs. No pleural effusions are evident. Upper Abdomen: There is again noted a mass in the right lobe of the liver measuring 5.9 x 5.6 cm. Visualized upper abdominal structures are otherwise unremarkable in appearance Musculoskeletal: There is degenerative change in the thoracic spine with diffuse idiopathic skeletal hyperostosis.  No blastic or lytic bone lesions are evident. No chest wall lesions are appreciable. Review of the MIP images confirms the above findings. IMPRESSION: 1. No demonstrable pulmonary embolus. No thoracic aortic aneurysm or dissection. Mild coronary artery calcification. 2. Widespread ground-glass type opacity consistent with multifocal pneumonia, likely of atypical organism etiology. Suspect underlying fairly diffuse small airways obstructive  disease, characterized by mosaic attenuation of the lung parenchyma at multiple sites. 3.  No evident adenopathy. 4.  Suspected metastasis in the anterior segment right lobe liver. 5.  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 Electronically Signed   By: Lowella Grip III M.D.   On: 08/16/2019 09:20        Scheduled Meds: . vitamin C  500 mg Oral BID  . aspirin EC  81 mg Oral QPC breakfast  . Chlorhexidine Gluconate Cloth  6 each Topical Daily  . cholecalciferol  2,000 Units Oral Daily  . enoxaparin (LOVENOX) injection  40 mg Subcutaneous QHS  . feeding supplement (ENSURE ENLIVE)  237 mL Oral TID BM  . gabapentin  300 mg Oral QHS  . insulin aspart  0-15 Units Subcutaneous Q4H  . insulin aspart  4 Units Subcutaneous TID WC  . simvastatin  20 mg Oral QPM  . zinc sulfate  220 mg Oral Daily   Continuous Infusions: . ceFEPime (MAXIPIME) IV 2 g (08/17/19 1016)  . vancomycin Stopped (08/17/19 0138)     LOS: 10 days    Time spent: 25 minutes    Barb Merino, MD [facility] Pager [phone]

## 2019-08-17 NOTE — Progress Notes (Signed)
Called by Tele for new ST elevation Lead II. Pt denies chest pain. HR 108; BP 97/73.  EKG performed:  Sinus tachycardia Left axis deviation Minimal voltage criteria for LVH, may be normal variant Nonspecific T wave abnormality MD Notified.

## 2019-08-18 ENCOUNTER — Inpatient Hospital Stay (HOSPITAL_COMMUNITY): Payer: Medicare Other

## 2019-08-18 ENCOUNTER — Encounter (INDEPENDENT_AMBULATORY_CARE_PROVIDER_SITE_OTHER): Payer: BC Managed Care – PPO | Admitting: Ophthalmology

## 2019-08-18 LAB — CULTURE, BLOOD (ROUTINE X 2)
Culture: NO GROWTH
Culture: NO GROWTH
Special Requests: ADEQUATE
Special Requests: ADEQUATE

## 2019-08-18 LAB — CBC WITH DIFFERENTIAL/PLATELET
Abs Immature Granulocytes: 0.07 10*3/uL (ref 0.00–0.07)
Basophils Absolute: 0.1 10*3/uL (ref 0.0–0.1)
Basophils Relative: 0 %
Eosinophils Absolute: 0.2 10*3/uL (ref 0.0–0.5)
Eosinophils Relative: 2 %
HCT: 30.5 % — ABNORMAL LOW (ref 39.0–52.0)
Hemoglobin: 9.5 g/dL — ABNORMAL LOW (ref 13.0–17.0)
Immature Granulocytes: 1 %
Lymphocytes Relative: 4 %
Lymphs Abs: 0.6 10*3/uL — ABNORMAL LOW (ref 0.7–4.0)
MCH: 32.8 pg (ref 26.0–34.0)
MCHC: 31.1 g/dL (ref 30.0–36.0)
MCV: 105.2 fL — ABNORMAL HIGH (ref 80.0–100.0)
Monocytes Absolute: 0.6 10*3/uL (ref 0.1–1.0)
Monocytes Relative: 5 %
Neutro Abs: 12.2 10*3/uL — ABNORMAL HIGH (ref 1.7–7.7)
Neutrophils Relative %: 88 %
Platelets: 151 10*3/uL (ref 150–400)
RBC: 2.9 MIL/uL — ABNORMAL LOW (ref 4.22–5.81)
RDW: 14.1 % (ref 11.5–15.5)
WBC: 13.8 10*3/uL — ABNORMAL HIGH (ref 4.0–10.5)
nRBC: 0.1 % (ref 0.0–0.2)

## 2019-08-18 LAB — BASIC METABOLIC PANEL
Anion gap: 14 (ref 5–15)
BUN: 18 mg/dL (ref 8–23)
CO2: 24 mmol/L (ref 22–32)
Calcium: 8.1 mg/dL — ABNORMAL LOW (ref 8.9–10.3)
Chloride: 100 mmol/L (ref 98–111)
Creatinine, Ser: 0.81 mg/dL (ref 0.61–1.24)
GFR calc Af Amer: >60 mL/min (ref >60)
GFR calc non Af Amer: >60 mL/min (ref >60)
Glucose, Bld: 135 mg/dL — ABNORMAL HIGH (ref 70–99)
Potassium: 4.5 mmol/L (ref 3.5–5.1)
Sodium: 138 mmol/L (ref 135–145)

## 2019-08-18 LAB — D-DIMER, QUANTITATIVE: D-Dimer, Quant: 2.78 ug/mL-FEU — ABNORMAL HIGH (ref 0.00–0.50)

## 2019-08-18 LAB — GLUCOSE, CAPILLARY
Glucose-Capillary: 107 mg/dL — ABNORMAL HIGH (ref 70–99)
Glucose-Capillary: 111 mg/dL — ABNORMAL HIGH (ref 70–99)
Glucose-Capillary: 140 mg/dL — ABNORMAL HIGH (ref 70–99)
Glucose-Capillary: 143 mg/dL — ABNORMAL HIGH (ref 70–99)
Glucose-Capillary: 205 mg/dL — ABNORMAL HIGH (ref 70–99)
Glucose-Capillary: 218 mg/dL — ABNORMAL HIGH (ref 70–99)
Glucose-Capillary: 237 mg/dL — ABNORMAL HIGH (ref 70–99)

## 2019-08-18 LAB — C-REACTIVE PROTEIN: CRP: 16.6 mg/dL — ABNORMAL HIGH (ref <1.0)

## 2019-08-18 LAB — PHOSPHORUS: Phosphorus: 3 mg/dL (ref 2.5–4.6)

## 2019-08-18 LAB — MAGNESIUM: Magnesium: 2 mg/dL (ref 1.7–2.4)

## 2019-08-18 MED ORDER — METHYLPREDNISOLONE SODIUM SUCC 40 MG IJ SOLR
40.0000 mg | Freq: Two times a day (BID) | INTRAMUSCULAR | Status: DC
  Administered 2019-08-18 – 2019-08-19 (×3): 40 mg via INTRAVENOUS
  Filled 2019-08-18 (×3): qty 1

## 2019-08-18 MED ORDER — SODIUM CHLORIDE 0.9 % IV BOLUS
500.0000 mL | Freq: Once | INTRAVENOUS | Status: AC
  Administered 2019-08-18: 500 mL via INTRAVENOUS

## 2019-08-18 MED ORDER — MORPHINE SULFATE (PF) 2 MG/ML IV SOLN
2.0000 mg | Freq: Once | INTRAVENOUS | Status: AC
  Administered 2019-08-18: 2 mg via INTRAVENOUS
  Filled 2019-08-18: qty 1

## 2019-08-18 MED ORDER — FUROSEMIDE 10 MG/ML IJ SOLN
40.0000 mg | Freq: Once | INTRAMUSCULAR | Status: AC
  Administered 2019-08-18: 40 mg via INTRAVENOUS
  Filled 2019-08-18: qty 4

## 2019-08-18 NOTE — Progress Notes (Signed)
PROGRESS NOTE    Vincent [name]  [id number] DOB: 05-Aug-1951 DOA: 08/06/2019 PCP: Leonard Downing, MD    Brief Narrative:  69 year old gentleman with history of type 2 diabetes, hypertension, metastatic pancreatic cancer on chemotherapy, last chemotherapy on 12/16 admitted on 12/30 with low oxygen level.  Initially requiring 8 L of oxygen.  His oxygen requirements has been fluctuating.  Unable to tolerate mobility with tachypnea and tachycardia. Remains on high flow oxygen. Continues to require high flow oxygen since last many days.    Assessment & Plan:   Principal Problem:   Pneumonia due to COVID-19 virus Active Problems:   Pancreatic cancer ([place])   Acute respiratory failure with hypoxia ([place])   Type 2 diabetes mellitus (HCC)   HLD (hyperlipidemia)  Acute hypoxemic respiratory failure due to pneumonia from COVID-19 virus: Continue to monitor due to significant symptoms, still on significant oxygen. chest physiotherapy, incentive spirometry, deep breathing exercises, sputum induction, mucolytic's and bronchodilators. Supplemental oxygen to keep saturations more than 85%. Covid directed therapy with , steroids, finish the steroid therapy.  Restarted high-dose Solu-Medrol today with ongoing hypoxia. remdesivir, finished 5 days of therapy Remains persistently hypoxic. CTA today showed more confluent pneumonia, negative for PE. Due to persistent hypoxia and poor improvement of respiratory status, will add broad-spectrum antibiotics empirically for 5 days.  Pancreatic cancer: Metastatic pancreatic cancer.  Followed by Dr. Burr Medico with oncology.  Recommended no need to continue Promacta.  Type 2 diabetes: On insulin coverage.  Not on treatment at home.  Stable.  Hyperlipidemia: On statin that he will continue.  Advance care planning: Patient continues to require high flow oxygen, occasionally worsening oxygen requirement.  Patient confirmed his wishes of DNR and not  wanting to go on ventilator. This was communicated with his wife also.  DVT prophylaxis: Lovenox subcu Code Status: DNR Family Communication: Wife on the phone. Disposition Plan: Anticipate home after clinical improvement.   Consultants:   None  Procedures:   None  Antimicrobials:  Anti-infectives (From admission, onward)   Start     Dose/Rate Route Frequency Ordered Stop   08/16/19 2300  vancomycin (VANCOREADY) IVPB 1750 mg/350 mL     1,750 mg 175 mL/hr over 120 Minutes Intravenous Every 12 hours 08/16/19 1011     08/16/19 1100  vancomycin (VANCOREADY) IVPB 2000 mg/400 mL     2,000 mg 200 mL/hr over 120 Minutes Intravenous  Once 08/16/19 0953 08/16/19 1400   08/16/19 1030  ceFEPIme (MAXIPIME) 2 g in sodium chloride 0.9 % 100 mL IVPB     2 g 200 mL/hr over 30 Minutes Intravenous Every 8 hours 08/16/19 0953     08/08/19 1000  remdesivir 100 mg in sodium chloride 0.9 % 100 mL IVPB     100 mg 200 mL/hr over 30 Minutes Intravenous Daily 08/07/19 0151 08/11/19 0855   08/07/19 0200  remdesivir 200 mg in sodium chloride 0.9% 250 mL IVPB     200 mg 580 mL/hr over 30 Minutes Intravenous Once 08/07/19 0151 08/07/19 0341         Subjective: Seen and examined.  Since last night he started requiring more and more oxygen. He himself looks okay, able to keep up conversation.  Slight movement drops his oxygen to 70s. Overnight events noted, became tachypneic and tachycardic, 500 mL bolus was given.   Objective: Vitals:   08/18/19 1000 08/18/19 1100 08/18/19 1200 08/18/19 1300  BP: 91/62 109/78 108/80 113/83  Pulse: (!) 108 (!) 113 (!) 106 (!) 104  Resp: (!) 31 (!) 31 (!) 36 (!) 25  Temp: 98.1 F (36.7 C)  98.1 F (36.7 C) 98 F (36.7 C)  TempSrc: Axillary  Axillary Axillary  SpO2: (!) 81% (!) 79% (!) 83% (!) 84%  Weight:      Height:        Intake/Output Summary (Last 24 hours) at 08/18/2019 1403 Last data filed at 08/18/2019 0700 Gross per 24 hour  Intake 2120 ml    Output 1475 ml  Net 645 ml   Filed Weights   08/06/19 2031  Weight: 105.2 kg    Examination: Physical Exam  Constitutional: He is oriented to person, place, and time. No distress.  On 15 L of oxygen.  Comfortable at rest, anxious and tachypneic on movement. He is in moderate respiratory distress.  HENT:  Head: Normocephalic.  Eyes: Pupils are equal, round, and reactive to light.  Cardiovascular: Normal rate and regular rhythm.  Pulmonary/Chest: Breath sounds normal. He is in respiratory distress.  Moderate.  Abdominal: Bowel sounds are normal.  Musculoskeletal:        General: No edema.     Cervical back: Neck supple.  Neurological: He is alert and oriented to person, place, and time.      Data Reviewed: I have personally reviewed following labs and imaging studies  CBC: Recent Labs  Lab 08/14/19 0158 08/15/19 0020 08/16/19 0330 08/17/19 0635 08/18/19 0600  WBC 8.7 9.8 10.9* 13.3* 13.8*  NEUTROABS 7.5 8.6* 9.2* 11.5* 12.2*  HGB 12.2* 11.5* 11.9* 11.5* 9.5*  HCT 37.7* 36.5* 37.8* 36.4* 30.5*  MCV 101.1* 102.8* 101.9* 102.2* 105.2*  PLT 206 248 227 215 [id number]   Basic Metabolic Panel: Recent Labs  Lab 08/12/19 0725 08/14/19 0158 08/15/19 0020 08/16/19 0330 08/17/19 0635 08/18/19 0600  NA 138 138 139 142  --  138  K 4.1 4.2 4.1 3.9  --  4.5  CL 103 102 102 103  --  100  CO2 25 26 25 27   --  24  GLUCOSE 135* 145* 166* 106*  --  135*  BUN 15 20 28* 27*  --  18  CREATININE 0.75 0.71 0.90 0.81  --  0.81  CALCIUM 8.3* 8.5* 8.4* 8.6*  --  8.1*  MG 1.8 2.4 2.3 2.1 2.0 2.0  PHOS 2.4* 4.0 3.5 3.6 3.5 3.0   GFR: Estimated Creatinine Clearance: 119.5 mL/min (by C-G formula based on SCr of 0.81 mg/dL). Liver Function Tests: Recent Labs  Lab 08/12/19 0725 08/14/19 0158 08/15/19 0020 08/16/19 0330  AST 54* 32 59* 45*  ALT 33 36 62* 67*  ALKPHOS 63 71 75 71  BILITOT 0.8 1.1 0.6 0.9  PROT 5.7* 6.8 6.6 6.7  ALBUMIN 2.8* 3.1* 2.8* 3.0*   No results for  input(s): LIPASE, AMYLASE in the last 168 hours. No results for input(s): AMMONIA in the last 168 hours. Coagulation Profile: No results for input(s): INR, PROTIME in the last 168 hours. Cardiac Enzymes: No results for input(s): CKTOTAL, CKMB, CKMBINDEX, TROPONINI in the last 168 hours. BNP (last 3 results) No results for input(s): PROBNP in the last 8760 hours. HbA1C: No results for input(s): HGBA1C in the last 72 hours. CBG: Recent Labs  Lab 08/17/19 1933 08/17/19 2359 08/18/19 0333 08/18/19 0729 08/18/19 1151  GLUCAP 113* 140* 107* 143* 205*   Lipid Profile: No results for input(s): CHOL, HDL, LDLCALC, TRIG, CHOLHDL, LDLDIRECT in the last 72 hours. Thyroid Function Tests: No results for input(s): TSH, T4TOTAL, FREET4, T3FREE, THYROIDAB in  the last 72 hours. Anemia Panel: No results for input(s): VITAMINB12, FOLATE, FERRITIN, TIBC, IRON, RETICCTPCT in the last 72 hours. Sepsis Labs: No results for input(s): PROCALCITON, LATICACIDVEN in the last 168 hours.  Recent Results (from the past 240 hour(s))  Culture, blood (routine x 2)     Status: None   Collection Time: 08/13/19  1:47 PM   Specimen: BLOOD  Result Value Ref Range Status   Specimen Description   Final    BLOOD RIGHT ARM Performed at [facility] [address], [place], [place] [address]    Special Requests   Final    BOTTLES DRAWN AEROBIC ONLY Blood Culture adequate volume Performed at [facility] [address], [place], [place] [address]    Culture   Final    NO GROWTH 5 DAYS Performed at [facility], [place] [address] [place], [place] [address]    Report Status 08/18/2019 FINAL  Final  Culture, blood (routine x 2)     Status: None   Collection Time: 08/13/19  2:00 PM   Specimen: BLOOD  Result Value Ref Range Status   Specimen Description   Final    BLOOD RIGHT ARM Performed at [facility] [address] [place], [place] [address]      Special Requests   Final    BOTTLES DRAWN AEROBIC ONLY Blood Culture adequate volume Performed at [facility] [address], [place], [place] [address]    Culture   Final    NO GROWTH 5 DAYS Performed at [facility], [place] [address] [place],  [address]    Report Status 08/18/2019 FINAL  Final         Radiology Studies: DG CHEST PORT 1 VIEW  Result Date: 08/18/2019 CLINICAL DATA:  Cough and fever.  COVID-19 positive EXAM: PORTABLE CHEST 1 VIEW COMPARISON:  08/13/2019 FINDINGS: Bilateral airspace disease compatible with pneumonia. Mild improvement in infiltrate in the right lung and mild progression on the left. No effusion or pneumothorax. Port-A-Cath tip at the cavoatrial junction unchanged. IMPRESSION: Diffuse bilateral airspace disease compatible with pneumonia. Mild improvement on the right and mild progression on the left. Electronically Signed   By: Franchot Gallo M.D.   On: 08/18/2019 08:09        Scheduled Meds: . vitamin C  500 mg Oral BID  . aspirin EC  81 mg Oral QPC breakfast  . Chlorhexidine Gluconate Cloth  6 each Topical Daily  . cholecalciferol  2,000 Units Oral Daily  . enoxaparin (LOVENOX) injection  40 mg Subcutaneous QHS  . feeding supplement (ENSURE ENLIVE)  237 mL Oral TID BM  . gabapentin  300 mg Oral QHS  . insulin aspart  0-15 Units Subcutaneous Q4H  . insulin aspart  4 Units Subcutaneous TID WC  . methylPREDNISolone (SOLU-MEDROL) injection  40 mg Intravenous Q12H  . simvastatin  20 mg Oral QPM  . zinc sulfate  220 mg Oral Daily   Continuous Infusions: . ceFEPime (MAXIPIME) IV 2 g (08/18/19 0950)  . vancomycin 1,750 mg (08/18/19 1048)     LOS: 11 days    Time spent: 25 minutes    Barb Merino, MD [facility] Pager [phone]

## 2019-08-18 NOTE — Progress Notes (Addendum)
MEWS Red HR 114 BP 108/79 RR 28  o2 sat 90% 15L NRB; 15L HFNC Pt A/ox4. NAD; denies SOB. Lung sounds are clear; diminished. Pt previously HR in 100s-110s, however gradually increasing throughout night. RR has been 24-32. O2 sat has been between 88-98 w/ 8-15L HFNC & 15L NRB; goal is >85%.  MEWs protocol initaited. Agricultural consultant notified. Rapid Response Notified. MD paged.   Still awaiting response from MD. Re-paged Dr. Vanita Ingles  Page #3 to Dr. Vanita Ingles. Currently awaiting response. Agricultural consultant notified.  MD placed order for 564ml NS bolus; given as ordered. Will continue to monitor pt.

## 2019-08-18 NOTE — Care Plan (Signed)
Paged for sinus tachycardia and tachypnea, patient reports he feels better after receiving tramadol.  He denies any pain, is afebrile.  He is net -1.6 L since admission.  Will administer 540ml bolus.  Wt Readings from Last 3 Encounters:  08/06/19 105.2 kg  08/06/19 99.7 kg  08/04/19 99.7 kg   Temp Readings from Last 3 Encounters:  08/18/19 (!) 97.4 F (36.3 C) (Axillary)  08/06/19 (!) 96.8 F (36 C)  08/04/19 98.2 F (36.8 C) (Temporal)   BP Readings from Last 3 Encounters:  08/18/19 104/77  08/06/19 112/76  08/04/19 100/60   Pulse Readings from Last 3 Encounters:  08/18/19 (!) 113  08/06/19 97  08/04/19 88

## 2019-08-18 NOTE — Progress Notes (Signed)
RT called to assess pt for increased WOB and O2 needs. Pt in no distress, no increased WOB, denies SOB.Pt states he is terrified he wont go home. RT sat with him and discussed his O2 needs, the idea of "happy hypoxic", what next steps would be for him, etc RT notified ICU charge/Rapid Response RN and 1st floor RN for update. Pt does not ned to come to ICU at this time and I think we can hold of on H HFNC for a while. He appears to be comfortable on 15L [place] hfnc and NRB. RT encouraged pt to lay on his back or side and to try and relax and not think about his fears. RT will closely monitor pt.

## 2019-08-19 LAB — CBC WITH DIFFERENTIAL/PLATELET
Abs Immature Granulocytes: 0.09 10*3/uL — ABNORMAL HIGH (ref 0.00–0.07)
Basophils Absolute: 0 10*3/uL (ref 0.0–0.1)
Basophils Relative: 0 %
Eosinophils Absolute: 0 10*3/uL (ref 0.0–0.5)
Eosinophils Relative: 0 %
HCT: 38.8 % — ABNORMAL LOW (ref 39.0–52.0)
Hemoglobin: 12.4 g/dL — ABNORMAL LOW (ref 13.0–17.0)
Immature Granulocytes: 1 %
Lymphocytes Relative: 4 %
Lymphs Abs: 0.6 10*3/uL — ABNORMAL LOW (ref 0.7–4.0)
MCH: 32.7 pg (ref 26.0–34.0)
MCHC: 32 g/dL (ref 30.0–36.0)
MCV: 102.4 fL — ABNORMAL HIGH (ref 80.0–100.0)
Monocytes Absolute: 0.5 10*3/uL (ref 0.1–1.0)
Monocytes Relative: 3 %
Neutro Abs: 14.8 10*3/uL — ABNORMAL HIGH (ref 1.7–7.7)
Neutrophils Relative %: 92 %
Platelets: 206 10*3/uL (ref 150–400)
RBC: 3.79 MIL/uL — ABNORMAL LOW (ref 4.22–5.81)
RDW: 13.7 % (ref 11.5–15.5)
WBC: 16.1 10*3/uL — ABNORMAL HIGH (ref 4.0–10.5)
nRBC: 0 % (ref 0.0–0.2)

## 2019-08-19 LAB — BASIC METABOLIC PANEL
Anion gap: 10 (ref 5–15)
BUN: 25 mg/dL — ABNORMAL HIGH (ref 8–23)
CO2: 26 mmol/L (ref 22–32)
Calcium: 8.7 mg/dL — ABNORMAL LOW (ref 8.9–10.3)
Chloride: 104 mmol/L (ref 98–111)
Creatinine, Ser: 0.69 mg/dL (ref 0.61–1.24)
GFR calc Af Amer: >60 mL/min (ref >60)
GFR calc non Af Amer: >60 mL/min (ref >60)
Glucose, Bld: 163 mg/dL — ABNORMAL HIGH (ref 70–99)
Potassium: 4.4 mmol/L (ref 3.5–5.1)
Sodium: 140 mmol/L (ref 135–145)

## 2019-08-19 LAB — PHOSPHORUS: Phosphorus: 3.9 mg/dL (ref 2.5–4.6)

## 2019-08-19 LAB — GLUCOSE, CAPILLARY
Glucose-Capillary: 140 mg/dL — ABNORMAL HIGH (ref 70–99)
Glucose-Capillary: 155 mg/dL — ABNORMAL HIGH (ref 70–99)
Glucose-Capillary: 159 mg/dL — ABNORMAL HIGH (ref 70–99)
Glucose-Capillary: 201 mg/dL — ABNORMAL HIGH (ref 70–99)
Glucose-Capillary: 238 mg/dL — ABNORMAL HIGH (ref 70–99)
Glucose-Capillary: 251 mg/dL — ABNORMAL HIGH (ref 70–99)

## 2019-08-19 LAB — D-DIMER, QUANTITATIVE: D-Dimer, Quant: 2.33 ug/mL-FEU — ABNORMAL HIGH (ref 0.00–0.50)

## 2019-08-19 LAB — MAGNESIUM: Magnesium: 2.5 mg/dL — ABNORMAL HIGH (ref 1.7–2.4)

## 2019-08-19 LAB — C-REACTIVE PROTEIN: CRP: 21.2 mg/dL — ABNORMAL HIGH (ref <1.0)

## 2019-08-19 MED ORDER — ENOXAPARIN SODIUM 60 MG/0.6ML ~~LOC~~ SOLN
0.5000 mg/kg | Freq: Every day | SUBCUTANEOUS | Status: DC
  Administered 2019-08-19 – 2019-08-20 (×2): 55 mg via SUBCUTANEOUS
  Filled 2019-08-19 (×2): qty 0.6

## 2019-08-19 MED ORDER — MORPHINE SULFATE (PF) 2 MG/ML IV SOLN
2.0000 mg | INTRAVENOUS | Status: DC | PRN
  Administered 2019-08-19 – 2019-09-01 (×12): 2 mg via INTRAVENOUS
  Filled 2019-08-19 (×12): qty 1

## 2019-08-19 MED ORDER — TRAMADOL HCL 50 MG PO TABS
100.0000 mg | ORAL_TABLET | Freq: Four times a day (QID) | ORAL | Status: DC | PRN
  Administered 2019-08-24 – 2019-09-01 (×7): 100 mg via ORAL
  Filled 2019-08-19 (×8): qty 2

## 2019-08-19 MED ORDER — METHYLPREDNISOLONE SODIUM SUCC 40 MG IJ SOLR
40.0000 mg | Freq: Three times a day (TID) | INTRAMUSCULAR | Status: DC
  Administered 2019-08-19 – 2019-08-25 (×18): 40 mg via INTRAVENOUS
  Filled 2019-08-19 (×18): qty 1

## 2019-08-19 NOTE — Progress Notes (Signed)
PROGRESS NOTE  Vincent [name]  [id number] DOB: [birth year] DOA: 08/06/2019 PCP: Leonard Downing, MD  Outpatient Specialists: Dr. Burr Medico, oncology Brief Narrative: Vincent [name] is a 69 y.o. male with a history of T2DM, metastatic pancreatic CA on chemotherapy, and HTN who presented 12/30 with hypoxia due to covid-19 pneumonia. Since admission, a full course of remdesivir and steroids were given with limited improvement, persistent, now severe, hypoxemia. Steroids have been restarted, CTA chest has shown confluent pneumonia and no pulmonary embolism. Broad spectrum antibiotics have been added empirically, though the patient's respiratory status has only deteriorated.  Assessment & Plan: Principal Problem:   Pneumonia due to COVID-19 virus Active Problems:   Pancreatic cancer ([place])   Acute respiratory failure with hypoxia ([place])   Type 2 diabetes mellitus (HCC)   HLD (hyperlipidemia)  Acute hypoxemic respiratory failure due to covid-19 pneumonia, possible superimposed bacterial pneumonia: SARS-CoV-2 Ag+ 12/30. Protracted hospitalization in immunocompromised patient. CTA chest 1/9 without evidence of PE. - Continue HFNC with permissive hypoxemia. Discussed with RT who is helping provide frequent reevaluations throughout the day. We can augment with heated high flow if patient's respirations become labored/dyspnea severe, or if not able to maintain SpO2 even at rest. Confirmed with the patient that I feel similarly to previous hospitalists that intubation, were it felt to be indicated, would not change the terminal course of illness, though that all aggressive medical interventions short of that are being employed. He does not wish for comfort measures whatsoever, but dyspnea and anxiety have been improved with morphine without concomitant respiratory depression, which we will continue prn.  - s/p remdesivir x5 days 12/31 - 1/4 - Steroids given since 12/30. Inflammatory markers  have yet to show significant improvement and in fact, CRP has been rising for several days. Will augment steroids today. - Continue vancomycin, cefepime empirically (started 1/9) due to infiltrate confluence on CTA. - Does not appear volume overloaded. - Vitamin C, vitamin D, zinc - Encourage IS, FV, and awake proning if able - Tylenol and antitussives prn - Continue airborne, contact precautions while admitted. Isolation period would be recommended for 21 days from positive testing. - Check CBC w/diff, CMP, CRP daily - Will augment to intermediate lovenox prophylactic dose due to recent promacta, active malignancy and concomitant covid increasing thrombotic risk.  - Maintain euvolemia/net negative. No evidence of pulmonary edema on CXR or overload on exam. 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 Most recently received promacta on 1/4 for marrow stimulation, though this was stopped due to thrombosis risk.  - Patient's oncologist is aware of admission. Clearly delaying chemotherapy at this time.  T2DM: Well-controlled chronically, HbA1c 6.6%.  - SSI to maintain tight control during acute illness and use of steroids.  Hyperlipidemia:  - Continue statin. LFTs not severely elevated.  Anemia of malignancy: Stable, no evidence of blood loss. Note significant dip yesterday, suspected to be spurious as hgb has returned >12.  - Continue monitoring.   DVT prophylaxis: Lovenox 0.5mg /kg q12h Code Status: DNR confirmed Family Communication: Wife by phone Disposition Plan: Uncertain, pending clinical progress. Very guarded prognosis at this time.  Consultants:   Palliative care medicine team  Procedures:   None  Antimicrobials:  Remdesivir  Cefepime, vancomycin   Subjective: Feels short of breath while eating when taking NRB off. This is reported to be moderate,  about stable from previous days, improved with rest and  oxygen.   Objective: Vitals:   08/19/19 0355 08/19/19 0800 08/19/19 1200 08/19/19 1600  BP: (!) 121/92 (!) 136/96 (!) 122/94 (!) 115/92  Pulse: 92 (!) 101 (!) 122 (!) 119  Resp: (!) 23 (!) 33 (!) 26 (!) 32  Temp: 97.6 F (36.4 C) 98 F (36.7 C) 97.8 F (36.6 C) 98.1 F (36.7 C)  TempSrc: Axillary Oral Oral Oral  SpO2: (!) 88% (!) 82% (!) 84% (!) 63%  Weight:      Height:        Intake/Output Summary (Last 24 hours) at 08/19/2019 1843 Last data filed at 08/19/2019 1700 Gross per 24 hour  Intake 600 ml  Output 1300 ml  Net -700 ml   Filed Weights   08/06/19 2031  Weight: 105.2 kg    Gen: Acutely and chronically ill-appearing male in no active distress. Pleasant, eating breakfast this AM. Pulm: Tachypneic, mildly labored with SpO2 hovering in 70%'s, diffuse nondependent crackles. CV: Regular tachycardia. No murmur, rub, or gallop. No JVD, no pedal edema. GI: Abdomen soft, non-tender, non-distended, with normoactive bowel sounds. No organomegaly or masses felt. Ext: Warm, decreased muscle bulk Skin: No rashes, lesions or ulcers Neuro: Alert and oriented. No focal neurological deficits. Psych: Judgement and insight appear normal. Mood & affect appropriate.   Data Reviewed: I have personally reviewed following labs and imaging studies  CBC: Recent Labs  Lab 08/15/19 0020 08/16/19 0330 08/17/19 0635 08/18/19 0600 08/19/19 0600  WBC 9.8 10.9* 13.3* 13.8* 16.1*  NEUTROABS 8.6* 9.2* 11.5* 12.2* 14.8*  HGB 11.5* 11.9* 11.5* 9.5* 12.4*  HCT 36.5* 37.8* 36.4* 30.5* 38.8*  MCV 102.8* 101.9* 102.2* 105.2* 102.4*  PLT 248 227 215 151 [id number]   Basic Metabolic Panel: Recent Labs  Lab 08/14/19 0158 08/15/19 0020 08/16/19 0330 08/17/19 0635 08/18/19 0600 08/19/19 0600  NA 138 139 142  --  138 140  K 4.2 4.1 3.9  --  4.5 4.4  CL 102 102 103  --  100 104  CO2 26 25 27   --  24 26  GLUCOSE 145* 166* 106*  --  135* 163*    BUN 20 28* 27*  --  18 25*  CREATININE 0.71 0.90 0.81  --  0.81 0.69  CALCIUM 8.5* 8.4* 8.6*  --  8.1* 8.7*  MG 2.4 2.3 2.1 2.0 2.0 2.5*  PHOS 4.0 3.5 3.6 3.5 3.0 3.9   GFR: Estimated Creatinine Clearance: 121 mL/min (by C-G formula based on SCr of 0.69 mg/dL). Liver Function Tests: Recent Labs  Lab 08/14/19 0158 08/15/19 0020 08/16/19 0330  AST 32 59* 45*  ALT 36 62* 67*  ALKPHOS 71 75 71  BILITOT 1.1 0.6 0.9  PROT 6.8 6.6 6.7  ALBUMIN 3.1* 2.8* 3.0*   No results for input(s): LIPASE, AMYLASE in the last 168 hours. No results for input(s): AMMONIA in the last 168 hours. Coagulation Profile: No results for input(s): INR, PROTIME in the last 168 hours. Cardiac Enzymes: No results for input(s): CKTOTAL, CKMB, CKMBINDEX, TROPONINI in the last 168 hours. BNP (last 3 results) No results for input(s): PROBNP in the last 8760 hours. HbA1C: No results for input(s): HGBA1C in the last 72 hours. CBG: Recent Labs  Lab 08/18/19 2313 08/19/19 0349 08/19/19 0748 08/19/19 1154 08/19/19 1610  GLUCAP 140* 201* 159* 251* 238*   Lipid Profile: No results for input(s): CHOL, HDL, LDLCALC, TRIG, CHOLHDL, LDLDIRECT in the last 72 hours. Thyroid Function Tests: No results for input(s): TSH, T4TOTAL, FREET4, T3FREE, THYROIDAB  in the last 72 hours. Anemia Panel: No results for input(s): VITAMINB12, FOLATE, FERRITIN, TIBC, IRON, RETICCTPCT in the last 72 hours. Urine analysis:    Component Value Date/Time   COLORURINE YELLOW 08/04/2019 1119   APPEARANCEUR CLEAR 08/04/2019 1119   LABSPEC 1.021 08/04/2019 1119   PHURINE 5.0 08/04/2019 1119   GLUCOSEU NEGATIVE 08/04/2019 1119   HGBUR NEGATIVE 08/04/2019 1119   BILIRUBINUR NEGATIVE 08/04/2019 1119   KETONESUR 5 (A) 08/04/2019 1119   PROTEINUR 30 (A) 08/04/2019 1119   NITRITE NEGATIVE 08/04/2019 1119   LEUKOCYTESUR NEGATIVE 08/04/2019 1119   Recent Results (from the past 240 hour(s))  Culture, blood (routine x 2)     Status: None    Collection Time: 08/13/19  1:47 PM   Specimen: BLOOD  Result Value Ref Range Status   Specimen Description   Final    BLOOD RIGHT ARM Performed at [facility], [place] [address], [place], [place] [address]    Special Requests   Final    BOTTLES DRAWN AEROBIC ONLY Blood Culture adequate volume Performed at [facility] [address], [place], [place] [address]    Culture   Final    NO GROWTH 5 DAYS Performed at [facility], [place] [address] [place], [place] [address]    Report Status 08/18/2019 FINAL  Final  Culture, blood (routine x 2)     Status: None   Collection Time: 08/13/19  2:00 PM   Specimen: BLOOD  Result Value Ref Range Status   Specimen Description   Final    BLOOD RIGHT ARM Performed at [facility] [address] [place], [place] [address]    Special Requests   Final    BOTTLES DRAWN AEROBIC ONLY Blood Culture adequate volume Performed at [facility] [address] [place], [place] [address]    Culture   Final    NO GROWTH 5 DAYS Performed at [facility], [place] [address], [place], [place] [address]    Report Status 08/18/2019 FINAL  Final      Radiology Studies: DG CHEST PORT 1 VIEW  Result Date: 08/18/2019 CLINICAL DATA:  Cough and fever.  COVID-19 positive EXAM: PORTABLE CHEST 1 VIEW COMPARISON:  08/13/2019 FINDINGS: Bilateral airspace disease compatible with pneumonia. Mild improvement in infiltrate in the right lung and mild progression on the left. No effusion or pneumothorax. Port-A-Cath tip at the cavoatrial junction unchanged. IMPRESSION: Diffuse bilateral airspace disease compatible with pneumonia. Mild improvement on the right and mild progression on the left. Electronically Signed   By: Franchot Gallo M.D.   On: 08/18/2019 08:09    Scheduled Meds: . vitamin C  500 mg Oral BID  . aspirin EC  81 mg Oral QPC breakfast  . Chlorhexidine Gluconate Cloth  6 each  Topical Daily  . cholecalciferol  2,000 Units Oral Daily  . enoxaparin (LOVENOX) injection  40 mg Subcutaneous QHS  . feeding supplement (ENSURE ENLIVE)  237 mL Oral TID BM  . gabapentin  300 mg Oral QHS  . insulin aspart  0-15 Units Subcutaneous Q4H  . insulin aspart  4 Units Subcutaneous TID WC  . methylPREDNISolone (SOLU-MEDROL) injection  40 mg Intravenous Q8H  . simvastatin  20 mg Oral QPM  . zinc sulfate  220 mg Oral Daily   Continuous Infusions: . ceFEPime (MAXIPIME) IV 2 g (08/19/19 1806)  . vancomycin 1,750 mg (08/19/19 1050)     LOS: 12 days  Time spent: 35 minutes.  Patrecia Pour, MD [facility] [email or web address] 08/19/2019, 6:43 PM

## 2019-08-19 NOTE — Progress Notes (Signed)
Pharmacy Antibiotic Note  Vincent [name] is a 69 y.o. male admitted on 08/06/2019 with COVID-19.  Of note, patient has metastatic pancreatic cancer and is on chemotherapy PTA. Pharmacy has been consulted for vancomycin and cefepime dosing for PNA. Renal function is normal, WBC increased to 16.1 with steroids likely contributing to this increase.  He remains afebrile and renal function is stable as well with an estimated crcl of 121 ml/min  Plan: Continue Vancomycin 1750 mg IV q12h Goal AUC 400-550. Expected AUC: 482.3 SCr used: 0.81 Continue Cefepime 2 g IV q8h Monitor renal function, clinical progress, cultures/sensitivities F/U LOT and de-escalate as able Vancomycin levels as clinically indicated   Height: 6\' 4"  (193 cm) Weight: 232 lb (105.2 kg) IBW/kg (Calculated) : 86.8  Temp (24hrs), Avg:98.1 F (36.7 C), Min:97.6 F (36.4 C), Max:98.5 F (36.9 C)  Recent Labs  Lab 08/14/19 0158 08/15/19 0020 08/16/19 0330 08/17/19 0635 08/18/19 0600 08/19/19 0600  WBC 8.7 9.8 10.9* 13.3* 13.8* 16.1*  CREATININE 0.71 0.90 0.81  --  0.81 0.69    Estimated Creatinine Clearance: 121 mL/min (by C-G formula based on SCr of 0.69 mg/dL).    No Known Allergies  Antimicrobials this admission: Remdesivir 12/31>1/4 Vancomycin 1/9>> Cefepime 1/9>>  Dose adjustments this admission:   Microbiology results: 1/6 BCx: ngtd  Thank you for involving pharmacy in this patient's care.  Rober Minion, PharmD., MS Clinical Pharmacist  Thank you for allowing pharmacy to be part of this patients care team. 08/19/2019 10:02 AM  **Pharmacist phone directory can be found on [email or web address] listed under [place]**

## 2019-08-20 LAB — CBC WITH DIFFERENTIAL/PLATELET
Abs Immature Granulocytes: 0.25 10*3/uL — ABNORMAL HIGH (ref 0.00–0.07)
Basophils Absolute: 0 10*3/uL (ref 0.0–0.1)
Basophils Relative: 0 %
Eosinophils Absolute: 0 10*3/uL (ref 0.0–0.5)
Eosinophils Relative: 0 %
HCT: 39 % (ref 39.0–52.0)
Hemoglobin: 12.7 g/dL — ABNORMAL LOW (ref 13.0–17.0)
Immature Granulocytes: 1 %
Lymphocytes Relative: 2 %
Lymphs Abs: 0.6 10*3/uL — ABNORMAL LOW (ref 0.7–4.0)
MCH: 33.2 pg (ref 26.0–34.0)
MCHC: 32.6 g/dL (ref 30.0–36.0)
MCV: 101.8 fL — ABNORMAL HIGH (ref 80.0–100.0)
Monocytes Absolute: 0.7 10*3/uL (ref 0.1–1.0)
Monocytes Relative: 3 %
Neutro Abs: 24.6 10*3/uL — ABNORMAL HIGH (ref 1.7–7.7)
Neutrophils Relative %: 94 %
Platelets: 276 10*3/uL (ref 150–400)
RBC: 3.83 MIL/uL — ABNORMAL LOW (ref 4.22–5.81)
RDW: 14 % (ref 11.5–15.5)
WBC: 26.2 10*3/uL — ABNORMAL HIGH (ref 4.0–10.5)
nRBC: 0 % (ref 0.0–0.2)

## 2019-08-20 LAB — POCT I-STAT 7, (LYTES, BLD GAS, ICA,H+H)
Bicarbonate: 22.8 mmol/L (ref 20.0–28.0)
Calcium, Ion: 1.26 mmol/L (ref 1.15–1.40)
HCT: 40 % (ref 39.0–52.0)
Hemoglobin: 13.6 g/dL (ref 13.0–17.0)
O2 Saturation: 71 %
Patient temperature: 98.2
Potassium: 4.3 mmol/L (ref 3.5–5.1)
Sodium: 139 mmol/L (ref 135–145)
TCO2: 24 mmol/L (ref 22–32)
pCO2 arterial: 32.4 mmHg (ref 32.0–48.0)
pH, Arterial: 7.455 — ABNORMAL HIGH (ref 7.350–7.450)
pO2, Arterial: 34 mmHg — CL (ref 83.0–108.0)

## 2019-08-20 LAB — COMPREHENSIVE METABOLIC PANEL
ALT: 91 U/L — ABNORMAL HIGH (ref 0–44)
AST: 32 U/L (ref 15–41)
Albumin: 2.8 g/dL — ABNORMAL LOW (ref 3.5–5.0)
Alkaline Phosphatase: 81 U/L (ref 38–126)
Anion gap: 10 (ref 5–15)
BUN: 30 mg/dL — ABNORMAL HIGH (ref 8–23)
CO2: 25 mmol/L (ref 22–32)
Calcium: 8.9 mg/dL (ref 8.9–10.3)
Chloride: 105 mmol/L (ref 98–111)
Creatinine, Ser: 0.79 mg/dL (ref 0.61–1.24)
GFR calc Af Amer: >60 mL/min (ref >60)
GFR calc non Af Amer: >60 mL/min (ref >60)
Glucose, Bld: 153 mg/dL — ABNORMAL HIGH (ref 70–99)
Potassium: 4.4 mmol/L (ref 3.5–5.1)
Sodium: 140 mmol/L (ref 135–145)
Total Bilirubin: 0.9 mg/dL (ref 0.3–1.2)
Total Protein: 7.3 g/dL (ref 6.5–8.1)

## 2019-08-20 LAB — PROCALCITONIN: Procalcitonin: 0.28 ng/mL

## 2019-08-20 LAB — C-REACTIVE PROTEIN: CRP: 8.4 mg/dL — ABNORMAL HIGH (ref <1.0)

## 2019-08-20 LAB — GLUCOSE, CAPILLARY
Glucose-Capillary: 235 mg/dL — ABNORMAL HIGH (ref 70–99)
Glucose-Capillary: 183 mg/dL — ABNORMAL HIGH (ref 70–99)
Glucose-Capillary: 180 mg/dL — ABNORMAL HIGH (ref 70–99)
Glucose-Capillary: 216 mg/dL — ABNORMAL HIGH (ref 70–99)
Glucose-Capillary: 189 mg/dL — ABNORMAL HIGH (ref 70–99)

## 2019-08-20 LAB — MAGNESIUM: Magnesium: 2.4 mg/dL (ref 1.7–2.4)

## 2019-08-20 LAB — D-DIMER, QUANTITATIVE: D-Dimer, Quant: 1.03 ug/mL-FEU — ABNORMAL HIGH (ref 0.00–0.50)

## 2019-08-20 LAB — PHOSPHORUS: Phosphorus: 3.9 mg/dL (ref 2.5–4.6)

## 2019-08-20 NOTE — Progress Notes (Signed)
PROGRESS NOTE  Vincent [name]  [id number] DOB: June 28, 1951 DOA: 08/06/2019 PCP: Leonard Downing, MD  Outpatient Specialists: Dr. Burr Medico, oncology Brief Narrative: Vincent [name] is a 69 y.o. male with a history of T2DM, metastatic pancreatic CA on chemotherapy, and HTN who presented 12/30 with hypoxia due to covid-19 pneumonia. Since admission, a full course of remdesivir and steroids were given with limited improvement, persistent, now severe, hypoxemia. Steroids have been restarted, CTA chest has shown confluent pneumonia and no pulmonary embolism. Broad spectrum antibiotics have been added empirically, though the patient's respiratory status has only deteriorated.  Assessment & Plan: Principal Problem:   Pneumonia due to COVID-19 virus Active Problems:   Pancreatic cancer ([place])   Acute respiratory failure with hypoxia ([place])   Type 2 diabetes mellitus (HCC)   HLD (hyperlipidemia)  Acute hypoxemic respiratory failure due to covid-19 pneumonia, possible superimposed bacterial pneumonia: SARS-CoV-2 Ag+ 12/30. Protracted hospitalization in immunocompromised patient. CTA chest 1/9 without evidence of PE. - Continue HFNC with permissive hypoxemia. Discussed with RT who is helping provide frequent reevaluations throughout the day. We can augment with heated high flow if patient's respirations become labored/dyspnea severe, or if not able to maintain SpO2 even at rest. Confirmed with the patient that I feel similarly to previous hospitalists that intubation, were it felt to be indicated, would not change the terminal course of illness, though that all aggressive medical interventions short of that are being employed. He does not wish for comfort measures whatsoever, but dyspnea and anxiety have been improved with morphine without concomitant respiratory depression, which we will continue prn.  SpO2: (!) 88 % O2 Flow Rate (L/min): 10 L/min  Recent Labs    08/18/19 0600  08/19/19 0600 08/20/19 0855  DDIMER 2.78* 2.33* 1.03*  CRP 16.6* 21.2* 8.4*  - s/p remdesivir x5 days 12/31 - 1/4 - Steroids ongoing since 12/30. 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 Most recently received promacta on 1/4 for marrow stimulation, though this was stopped due to thrombosis risk.  - Patient's oncologist is aware of admission. Continue to delay chemotherapy at this time. -Continue lovenox 1/2mg  per kg q12h given high risk for DVT and uptrending Dimer previously  T2DM: Well-controlled chronically, HbA1c 6.6%.  - SSI to maintain tight control during acute illness and use of steroids.  Hyperlipidemia:  - Continue statin. LFTs not severely elevated.  Anemia of malignancy: Stable, no evidence of blood loss. Note significant dip yesterday, suspected to be spurious as hgb has returned >12.  - Continue monitoring.   DVT prophylaxis: Lovenox 0.5mg /kg q12h Code Status: DNR confirmed Family Communication: Wife by phone Disposition Plan: Uncertain, pending clinical progress. Very guarded prognosis at this time.  Consultants:   Palliative care medicine team  Procedures:   None  Antimicrobials:  Remdesivir  Cefepime, vancomycin   Subjective: No acute issues or events overnight, continues to desat while eating off nonrebreather but suddenly had unplugged his nasal cannula this morning and while on nonrebreather alone sats maintained in the mid 90s.  Generally feeling somewhat improved from previous but not yet back to baseline, remarkably dyspneic with minimal exertion or change in  position.  Objective: Vitals:   08/19/19 2000  08/19/19 2335 08/19/19 2338 08/20/19 0500  BP: 107/80 (!) 129/96  106/72  Pulse: (!) 111 (!) 102  (!) 105  Resp: (!) 25 15  (!) 24  Temp:   97.6 F (36.4 C) 97.9 F (36.6 C)  TempSrc:   Oral Oral  SpO2:  90%  92%  Weight:      Height:        Intake/Output Summary (Last 24 hours) at 08/20/2019 [id number] Last data filed at 08/19/2019 1700 Gross per 24 hour  Intake 600 ml  Output 800 ml  Net -200 ml   Filed Weights   08/06/19 2031  Weight: 105.2 kg    Gen: Acutely and chronically ill-appearing male in no active distress. Pleasant, eating breakfast this AM. Pulm: Tachypneic, mildly labored rating nonrebreather  CV: Regular tachycardia. No murmur, rub, or gallop. No JVD, no pedal edema. GI: Abdomen soft, non-tender, non-distended, with normoactive bowel sounds. No organomegaly or masses felt. Ext: Warm, decreased muscle bulk Skin: No rashes, lesions or ulcers Neuro: Alert and oriented. No focal neurological deficits. Psych: Judgement and insight appear normal. Mood & affect appropriate.   Data Reviewed: I have personally reviewed following labs and imaging studies  CBC: Recent Labs  Lab 08/15/19 0020 08/16/19 0330 08/17/19 0635 08/18/19 0600 08/19/19 0600  WBC 9.8 10.9* 13.3* 13.8* 16.1*  NEUTROABS 8.6* 9.2* 11.5* 12.2* 14.8*  HGB 11.5* 11.9* 11.5* 9.5* 12.4*  HCT 36.5* 37.8* 36.4* 30.5* 38.8*  MCV 102.8* 101.9* 102.2* 105.2* 102.4*  PLT 248 227 215 151 [id number]   Basic Metabolic Panel: Recent Labs  Lab 08/14/19 0158 08/15/19 0020 08/16/19 0330 08/17/19 0635 08/18/19 0600 08/19/19 0600  NA 138 139 142  --  138 140  K 4.2 4.1 3.9  --  4.5 4.4  CL 102 102 103  --  100 104  CO2 26 25 27   --  24 26  GLUCOSE 145* 166* 106*  --  135* 163*  BUN 20 28* 27*  --  18 25*  CREATININE 0.71 0.90 0.81  --  0.81 0.69  CALCIUM 8.5* 8.4* 8.6*  --  8.1* 8.7*  MG 2.4 2.3 2.1 2.0 2.0 2.5*  PHOS 4.0 3.5 3.6 3.5 3.0 3.9    GFR: Estimated Creatinine Clearance: 121 mL/min (by C-G formula based on SCr of 0.69 mg/dL). Liver Function Tests: Recent Labs  Lab 08/14/19 0158 08/15/19 0020 08/16/19 0330  AST 32 59* 45*  ALT 36 62* 67*  ALKPHOS 71 75 71  BILITOT 1.1 0.6 0.9  PROT 6.8 6.6 6.7  ALBUMIN 3.1* 2.8* 3.0*   CBG: Recent Labs  Lab 08/19/19 0748 08/19/19 1154 08/19/19 1610 08/19/19 2335 08/20/19 0434  GLUCAP 159* 251* 238* 155* 183*   Urine analysis:    Component Value Date/Time   COLORURINE YELLOW 08/04/2019 [place] 08/04/2019 1119   LABSPEC 1.021 08/04/2019 1119   PHURINE 5.0 08/04/2019 1119   GLUCOSEU NEGATIVE 08/04/2019 1119   HGBUR NEGATIVE 08/04/2019 1119   BILIRUBINUR NEGATIVE 08/04/2019 1119   KETONESUR 5 (A) 08/04/2019 1119   PROTEINUR 30 (A) 08/04/2019 1119   NITRITE NEGATIVE 08/04/2019 1119   LEUKOCYTESUR NEGATIVE 08/04/2019 1119   Recent Results (from the past 240 hour(s))  Culture, blood (routine x 2)     Status: None   Collection Time: 08/13/19  1:47 PM   Specimen: BLOOD  Result Value Ref Range Status   Specimen Description   Final    BLOOD RIGHT ARM Performed at [facility]  Hospital, [place] [address] [place], [place] [address]    Special Requests   Final    BOTTLES DRAWN AEROBIC ONLY Blood Culture adequate volume Performed at [facility] [address] [place] [address], [place], [place] [address]    Culture   Final    NO GROWTH 5 DAYS Performed at [facility], [place] [address], [place], [place] [address]    Report Status 08/18/2019 FINAL  Final  Culture, blood (routine x 2)     Status: None   Collection Time: 08/13/19  2:00 PM   Specimen: BLOOD  Result Value Ref Range Status   Specimen Description   Final    BLOOD RIGHT ARM Performed at [facility] [address] [place], [place] [address]    Special Requests   Final    BOTTLES DRAWN AEROBIC ONLY Blood Culture adequate volume Performed at [facility] [address], [place], [place] [address]    Culture   Final    NO GROWTH 5 DAYS Performed at [facility], [place] [address], [place], [place] [address]    Report Status 08/18/2019 FINAL  Final      Radiology Studies: No results found.  Scheduled Meds: . vitamin C  500 mg Oral BID  . aspirin EC  81 mg Oral QPC breakfast  . Chlorhexidine Gluconate Cloth  6 each Topical Daily  . cholecalciferol  2,000 Units Oral Daily  . enoxaparin (LOVENOX) injection  0.5 mg/kg Subcutaneous QHS  . feeding supplement (ENSURE ENLIVE)  237 mL Oral TID BM  . gabapentin  300 mg Oral QHS  . insulin aspart  0-15 Units Subcutaneous Q4H  . insulin aspart  4 Units Subcutaneous TID WC  . methylPREDNISolone (SOLU-MEDROL) injection  40 mg Intravenous Q8H  . simvastatin  20 mg Oral QPM  . zinc sulfate  220 mg Oral Daily   Continuous Infusions: . ceFEPime (MAXIPIME) IV 2 g (08/20/19 0202)  . vancomycin 1,750 mg (08/19/19 2358)     LOS: 13 days   Time spent: 35 minutes.  Little Ishikawa, DO [facility] Pager: Epic chat 08/20/2019, 8:21 AM

## 2019-08-20 NOTE — Progress Notes (Addendum)
Cross Cover note:  Patient noted to acutely decompensate requiring 15 L non-rebreather but unable to maintain oxygenation, sats ~ 60-70%.  Patient still oriented x 3, maintaining appropriate conversation denies new symptoms, he is SOB, but no new chest pain, is feeling anxious.  General: anxious, dyspneic, continues to remain AAOx3 HEENT: EOMI, Anicteric sclera Lungs: no wheezing, few crackles, good effort, baseline tachypneic Cardiac: Tachycardic rate and rhythm, no mrg  Exam limited by PPE  Plan - transfer to ICU, place on heated high flow - given acute decompensation will obtain CTA Chest, PE protocol once patient has stabilized - patient did express to all of [place] that he would want for everything to be done, including CPR/Intubation.  Subsequently patient desaturated with attempt to transfer CTA.  Discussed with Dr. Jeannene Patella, ICU, appreciate assistance, agreed with beginning hep gtt, single dose of IV lasix empirically until imaging can be obtained.  Patient was escalated to BIPAP and now comfortable.  Precedex gtt ordered by Dr. Jeannene Patella.  At this time patient is stable, though prior code status of DNR/DNI given patient's comorbidities would be appropriate, though at time of clarification earlier, patient did state he wished to have compressions and intubation.  Will not urgently address at this moment given improvement and now stability on BIPAP given time, but would be appropriate for day team to re-address.  However, if there is any concern overnight, will re-address with patient.  Truddie Hidden, MD Hospitalist Medicine

## 2019-08-20 NOTE — Progress Notes (Signed)
Non-sustained V-tach and multifocal PVC's reported by telemetry tech, pt transferring from bed to chair at time of ectopy and asymptomatic at time. [place],MD made aware and acknowledged, no new orders, continue to monitor.

## 2019-08-21 ENCOUNTER — Inpatient Hospital Stay (HOSPITAL_COMMUNITY): Payer: Medicare Other

## 2019-08-21 DIAGNOSIS — J1282 Pneumonia due to coronavirus disease 2019: Secondary | ICD-10-CM

## 2019-08-21 DIAGNOSIS — R791 Abnormal coagulation profile: Secondary | ICD-10-CM

## 2019-08-21 DIAGNOSIS — J9601 Acute respiratory failure with hypoxia: Secondary | ICD-10-CM

## 2019-08-21 DIAGNOSIS — U071 COVID-19: Principal | ICD-10-CM

## 2019-08-21 DIAGNOSIS — C250 Malignant neoplasm of head of pancreas: Secondary | ICD-10-CM

## 2019-08-21 LAB — CBC WITH DIFFERENTIAL/PLATELET
Abs Immature Granulocytes: 0.17 10*3/uL — ABNORMAL HIGH (ref 0.00–0.07)
Basophils Absolute: 0 10*3/uL (ref 0.0–0.1)
Basophils Relative: 0 %
Eosinophils Absolute: 0 10*3/uL (ref 0.0–0.5)
Eosinophils Relative: 0 %
HCT: 38.8 % — ABNORMAL LOW (ref 39.0–52.0)
Hemoglobin: 12.8 g/dL — ABNORMAL LOW (ref 13.0–17.0)
Immature Granulocytes: 1 %
Lymphocytes Relative: 2 %
Lymphs Abs: 0.4 10*3/uL — ABNORMAL LOW (ref 0.7–4.0)
MCH: 33.1 pg (ref 26.0–34.0)
MCHC: 33 g/dL (ref 30.0–36.0)
MCV: 100.3 fL — ABNORMAL HIGH (ref 80.0–100.0)
Monocytes Absolute: 0.8 10*3/uL (ref 0.1–1.0)
Monocytes Relative: 4 %
Neutro Abs: 21.4 10*3/uL — ABNORMAL HIGH (ref 1.7–7.7)
Neutrophils Relative %: 93 %
Platelets: 256 10*3/uL (ref 150–400)
RBC: 3.87 MIL/uL — ABNORMAL LOW (ref 4.22–5.81)
RDW: 13.8 % (ref 11.5–15.5)
WBC: 22.9 10*3/uL — ABNORMAL HIGH (ref 4.0–10.5)
nRBC: 0 % (ref 0.0–0.2)

## 2019-08-21 LAB — POCT I-STAT 7, (LYTES, BLD GAS, ICA,H+H)
Acid-base deficit: 1 mmol/L (ref 0.0–2.0)
Bicarbonate: 23.8 mmol/L (ref 20.0–28.0)
Calcium, Ion: 1.24 mmol/L (ref 1.15–1.40)
HCT: 42 % (ref 39.0–52.0)
Hemoglobin: 14.3 g/dL (ref 13.0–17.0)
O2 Saturation: 80 %
Patient temperature: 98.6
Potassium: 4.8 mmol/L (ref 3.5–5.1)
Sodium: 139 mmol/L (ref 135–145)
TCO2: 25 mmol/L (ref 22–32)
pCO2 arterial: 39.1 mmHg (ref 32.0–48.0)
pH, Arterial: 7.393 (ref 7.350–7.450)
pO2, Arterial: 45 mmHg — ABNORMAL LOW (ref 83.0–108.0)

## 2019-08-21 LAB — COMPREHENSIVE METABOLIC PANEL
ALT: 108 U/L — ABNORMAL HIGH (ref 0–44)
AST: 42 U/L — ABNORMAL HIGH (ref 15–41)
Albumin: 2.6 g/dL — ABNORMAL LOW (ref 3.5–5.0)
Alkaline Phosphatase: 82 U/L (ref 38–126)
Anion gap: 12 (ref 5–15)
BUN: 31 mg/dL — ABNORMAL HIGH (ref 8–23)
CO2: 22 mmol/L (ref 22–32)
Calcium: 8.7 mg/dL — ABNORMAL LOW (ref 8.9–10.3)
Chloride: 106 mmol/L (ref 98–111)
Creatinine, Ser: 0.72 mg/dL (ref 0.61–1.24)
GFR calc Af Amer: >60 mL/min (ref >60)
GFR calc non Af Amer: >60 mL/min (ref >60)
Glucose, Bld: 184 mg/dL — ABNORMAL HIGH (ref 70–99)
Potassium: 4.6 mmol/L (ref 3.5–5.1)
Sodium: 140 mmol/L (ref 135–145)
Total Bilirubin: 1.1 mg/dL (ref 0.3–1.2)
Total Protein: 7 g/dL (ref 6.5–8.1)

## 2019-08-21 LAB — C-REACTIVE PROTEIN: CRP: 4.7 mg/dL — ABNORMAL HIGH (ref <1.0)

## 2019-08-21 LAB — GLUCOSE, CAPILLARY
Glucose-Capillary: 251 mg/dL — ABNORMAL HIGH (ref 70–99)
Glucose-Capillary: 271 mg/dL — ABNORMAL HIGH (ref 70–99)
Glucose-Capillary: 224 mg/dL — ABNORMAL HIGH (ref 70–99)
Glucose-Capillary: 132 mg/dL — ABNORMAL HIGH (ref 70–99)
Glucose-Capillary: 222 mg/dL — ABNORMAL HIGH (ref 70–99)
Glucose-Capillary: 222 mg/dL — ABNORMAL HIGH (ref 70–99)

## 2019-08-21 LAB — MAGNESIUM: Magnesium: 2.3 mg/dL (ref 1.7–2.4)

## 2019-08-21 LAB — PROCALCITONIN: Procalcitonin: 0.29 ng/mL

## 2019-08-21 LAB — APTT: aPTT: 29 seconds (ref 24–36)

## 2019-08-21 LAB — PROTIME-INR
INR: 1.2 (ref 0.8–1.2)
Prothrombin Time: 15.5 seconds — ABNORMAL HIGH (ref 11.4–15.2)

## 2019-08-21 LAB — PHOSPHORUS: Phosphorus: 3.5 mg/dL (ref 2.5–4.6)

## 2019-08-21 LAB — HEPARIN LEVEL (UNFRACTIONATED): Heparin Unfractionated: 0.29 IU/mL — ABNORMAL LOW (ref 0.30–0.70)

## 2019-08-21 LAB — D-DIMER, QUANTITATIVE: D-Dimer, Quant: 3 ug/mL-FEU — ABNORMAL HIGH (ref 0.00–0.50)

## 2019-08-21 MED ORDER — FUROSEMIDE 10 MG/ML IJ SOLN: 60.0000 mg | Freq: Once | INTRAMUSCULAR | Status: AC

## 2019-08-21 MED ORDER — DEXMEDETOMIDINE HCL IN NACL 400 MCG/100ML IV SOLN
0.4000 ug/kg/h | INTRAVENOUS | Status: DC
  Administered 2019-08-21: 0.6 ug/kg/h via INTRAVENOUS
  Administered 2019-08-21: 05:00:00 0.8 ug/kg/h via INTRAVENOUS
  Filled 2019-08-21 (×2): qty 100

## 2019-08-21 MED ORDER — FUROSEMIDE 10 MG/ML IJ SOLN
INTRAMUSCULAR | Status: AC
  Administered 2019-08-21: 60 mg via INTRAVENOUS
  Filled 2019-08-21: qty 8

## 2019-08-21 MED ORDER — INSULIN GLARGINE 100 UNIT/ML ~~LOC~~ SOLN
20.0000 [IU] | Freq: Every day | SUBCUTANEOUS | Status: DC
  Administered 2019-08-21 – 2019-09-02 (×11): 20 [IU] via SUBCUTANEOUS
  Filled 2019-08-21 (×16): qty 0.2

## 2019-08-21 MED ORDER — HEPARIN (PORCINE) 25000 UT/250ML-% IV SOLN
1500.0000 [IU]/h | INTRAVENOUS | Status: DC
  Administered 2019-08-21: 1500 [IU]/h via INTRAVENOUS

## 2019-08-21 MED ORDER — INSULIN ASPART 100 UNIT/ML ~~LOC~~ SOLN
0.0000 [IU] | Freq: Three times a day (TID) | SUBCUTANEOUS | Status: DC
  Administered 2019-08-22: 4 [IU] via SUBCUTANEOUS
  Administered 2019-08-22: 15 [IU] via SUBCUTANEOUS
  Administered 2019-08-22: 3 [IU] via SUBCUTANEOUS
  Administered 2019-08-22: 22:00:00 7 [IU] via SUBCUTANEOUS
  Administered 2019-08-23: 20:00:00 3 [IU] via SUBCUTANEOUS
  Administered 2019-08-23: 4 [IU] via SUBCUTANEOUS
  Administered 2019-08-23: 16:00:00 7 [IU] via SUBCUTANEOUS
  Administered 2019-08-23 – 2019-08-24 (×2): 4 [IU] via SUBCUTANEOUS
  Administered 2019-08-24: 3 [IU] via SUBCUTANEOUS
  Administered 2019-08-24: 21:00:00 4 [IU] via SUBCUTANEOUS
  Administered 2019-08-24: 12:00:00 11 [IU] via SUBCUTANEOUS
  Administered 2019-08-25: 4 [IU] via SUBCUTANEOUS
  Administered 2019-08-25: 3 [IU] via SUBCUTANEOUS
  Administered 2019-08-26 (×2): 4 [IU] via SUBCUTANEOUS
  Administered 2019-08-27: 7 [IU] via SUBCUTANEOUS
  Administered 2019-08-27: 3 [IU] via SUBCUTANEOUS
  Administered 2019-08-27 – 2019-08-28 (×2): 4 [IU] via SUBCUTANEOUS
  Administered 2019-08-28: 3 [IU] via SUBCUTANEOUS
  Administered 2019-08-29: 4 [IU] via SUBCUTANEOUS
  Administered 2019-08-29: 3 [IU] via SUBCUTANEOUS
  Administered 2019-08-30 (×3): 4 [IU] via SUBCUTANEOUS
  Administered 2019-08-30: 11 [IU] via SUBCUTANEOUS
  Administered 2019-08-31 (×2): 4 [IU] via SUBCUTANEOUS
  Administered 2019-08-31: 7 [IU] via SUBCUTANEOUS
  Administered 2019-08-31 – 2019-09-01 (×2): 4 [IU] via SUBCUTANEOUS
  Administered 2019-09-01: 3 [IU] via SUBCUTANEOUS
  Administered 2019-09-01 (×2): 4 [IU] via SUBCUTANEOUS
  Administered 2019-09-02 – 2019-09-03 (×3): 3 [IU] via SUBCUTANEOUS

## 2019-08-21 MED ORDER — HEPARIN BOLUS VIA INFUSION
5400.0000 [IU] | Freq: Once | INTRAVENOUS | Status: AC
  Filled 2019-08-21: qty 5400

## 2019-08-21 MED ORDER — ENOXAPARIN SODIUM 40 MG/0.4ML ~~LOC~~ SOLN
40.0000 mg | Freq: Two times a day (BID) | SUBCUTANEOUS | Status: DC
  Administered 2019-08-21 – 2019-08-22 (×4): 40 mg via SUBCUTANEOUS
  Filled 2019-08-21 (×5): qty 0.4

## 2019-08-21 MED ORDER — INSULIN ASPART 100 UNIT/ML ~~LOC~~ SOLN
0.0000 [IU] | Freq: Three times a day (TID) | SUBCUTANEOUS | Status: DC
  Administered 2019-08-21: 7 [IU] via SUBCUTANEOUS

## 2019-08-21 MED ORDER — HEPARIN (PORCINE) 25000 UT/250ML-% IV SOLN
INTRAVENOUS | Status: AC
  Administered 2019-08-21: 5400 [IU] via INTRAVENOUS
  Filled 2019-08-21: qty 250

## 2019-08-21 MED ORDER — ENOXAPARIN SODIUM 120 MG/0.8ML ~~LOC~~ SOLN: 1.0000 mg/kg | Freq: Two times a day (BID) | SUBCUTANEOUS | Status: DC

## 2019-08-21 NOTE — Progress Notes (Signed)
Placed pt on BiPAP at this time per MD verbal request. Pt tolerating well, RT to continue to monitor as needed

## 2019-08-21 NOTE — Progress Notes (Signed)
ANTICOAGULATION CONSULT NOTE - Initial Consult  Pharmacy Consult for  heparin infusion Indication: r/o PE  No Known Allergies  Patient Measurements: Height: 6' 4" (193 cm) Weight: 232 lb (105.2 kg) IBW/kg (Calculated) : 86.8 Heparin Dosing Weight:  HEPARIN DW (KG): 105.2  Vital Signs: Temp: 98.6 F (37 C) (01/14 0000) Temp Source: Oral (01/14 0000) BP: 128/99 (01/14 0000) Pulse Rate: 108 (01/14 0000)  Labs: Recent Labs    08/18/19 0600 08/18/19 0600 08/19/19 0600 08/19/19 0600 08/20/19 0855 08/20/19 0855 08/20/19 2301 08/21/19 0027  HGB 9.5*   < > 12.4*   < > 12.7*   < > 13.6 12.8*  HCT 30.5*   < > 38.8*   < > 39.0  --  40.0 38.8*  PLT 151   < > 206  --  276  --   --  256  CREATININE 0.81  --  0.69  --  0.79  --   --   --    < > = values in this interval not displayed.    Estimated Creatinine Clearance: 121 mL/min (by C-G formula based on SCr of 0.79 mg/dL).   Medical History: Past Medical History:  Diagnosis Date  . Diabetes mellitus without complication (HCC)    pt. states that he is not diabeticalthought he takes metformin  . Eye hemorrhage    right  . Family history of lung cancer   . Hypertension   . Monoallelic mutation of BRIP1 gene   . Monoallelic mutation of MUTYH gene      Assessment: Pharmacy consulted to dose heparin infusion to r/o PE for   this 68 yo male with increased oxygen requirements and SOB. Due to patient's increased O2 needs, CTA per PE protocol couldn't be performed. His last dose of  Lovenox 55mg  was on 08/20/19   at  2216.  CBC is WNL and D-Dimer has increased to 3.0 mcg-mL/FEU.    Goal of Therapy:  Heparin level 0.3-0.7 units/ml Monitor platelets by anticoagulation protocol: Yes   Plan:  Give 5400 units bolus x 1 Start heparin infusion at 1500 units/hr Check anti-Xa level in 6-8 hours and daily while on heparin Continue to monitor H&H and platelets  Tamara Meyer 08/21/2019,2:06 AM  

## 2019-08-21 NOTE — Progress Notes (Signed)
ANTICOAGULATION CONSULT NOTE - Follow Up Consult  Pharmacy Consult for Heparin IV > Lovenox Indication: r/o PE, VTE prophylaxis  No Known Allergies  Patient Measurements: Height: 6\' 4"  (193 cm) Weight: 201 lb 14.4 oz (91.6 kg) IBW/kg (Calculated) : 86.8 Heparin Dosing Weight: TBW  Vital Signs: Temp: 99 F (37.2 C) (01/14 0200) Temp Source: Oral (01/14 0200) BP: 108/83 (01/14 0846) Pulse Rate: 70 (01/14 0846)  Labs: Recent Labs    08/19/19 0600 08/19/19 0600 08/20/19 0855 08/20/19 0855 08/20/19 2301 08/20/19 2301 08/21/19 0027 08/21/19 0200 08/21/19 0236  HGB 12.4*   < > 12.7*   < > 13.6   < > 12.8*  --  14.3  HCT 38.8*   < > 39.0   < > 40.0  --  38.8*  --  42.0  PLT 206  --  276  --   --   --  256  --   --   APTT  --   --   --   --   --   --   --  29  --   LABPROT  --   --   --   --   --   --   --  15.5*  --   INR  --   --   --   --   --   --   --  1.2  --   CREATININE 0.69  --  0.79  --   --   --  0.72  --   --    < > = values in this interval not displayed.    Estimated Creatinine Clearance: 111.5 mL/min (by C-G formula based on SCr of 0.72 mg/dL).   Medications:  Scheduled:  . vitamin C  500 mg Oral BID  . aspirin EC  81 mg Oral QPC breakfast  . Chlorhexidine Gluconate Cloth  6 each Topical Daily  . cholecalciferol  2,000 Units Oral Daily  . feeding supplement (ENSURE ENLIVE)  237 mL Oral TID BM  . gabapentin  300 mg Oral QHS  . insulin aspart  0-15 Units Subcutaneous Q4H  . insulin aspart  4 Units Subcutaneous TID WC  . methylPREDNISolone (SOLU-MEDROL) injection  40 mg Intravenous Q8H  . simvastatin  20 mg Oral QPM  . zinc sulfate  220 mg Oral Daily   Infusions:  . dexmedetomidine (PRECEDEX) IV infusion 0.8 mcg/kg/hr (08/21/19 0523)  . heparin 1,500 Units/hr (08/21/19 0400)    Assessment: 44 yoM admitted on 12/30 with COVID-19 pneumonia.  Pharmacy consulted to dose heparin infusion to r/o PE d/t increased oxygen requirements and SOB. Due to  patient's increased O2 needs, CTA per PE protocol couldn't be performed.  His last dose of  Lovenox 55mg   was on 08/20/19 at 2216.    Today, 08/21/2019:  Pharmacy consulted to change from Heparin IV back to prophylactic Lovenox. Heparin level 0.29 CBC:  H/H and Plt stable D-dimer 3 SCr 0.72   Goal of Therapy:  Heparin level 0.3-0.7 units/ml Monitor platelets by anticoagulation protocol: Yes   Plan:   D/C heparin IV per Dr. Avon Gully  Lovenox 40 mg [place] q12h.  Pharmacy to sign off consult, but will continue to follow peripherally.   Gretta Arab PharmD, BCPS Clinical pharmacist phone 7am- 5pm: [phone] 08/21/2019 9:12 AM

## 2019-08-21 NOTE — Progress Notes (Signed)
Dr Avon Gully notified of pt worsening condition contacted family and visitation arranged, transported pt down to visit family

## 2019-08-21 NOTE — Progress Notes (Signed)
Pt facetimed with wife on person cell phone.  Updated wife on pt's status.

## 2019-08-21 NOTE — Progress Notes (Signed)
RN noticed pt sats 70's.  Pt placed on 15 LNC and 15 LHFNC with no recovery. Pt sats dropped to 50's 60's. Pleth changed with no noted improvement. RT and CN notified. Pt placed on heated HF and moved to C side of the unit for further care.

## 2019-08-21 NOTE — Progress Notes (Signed)
Bilateral lower extremity venous duplex completed.  08/21/2019 2:08 PM Maudry Mayhew, MHA, RVT, RDCS, RDMS

## 2019-08-21 NOTE — Progress Notes (Addendum)
[facility] Progress Note Patient Name: Vincent [name] DOB: 1951-07-06 MRN: [id number]   Date of Service  08/21/2019  HPI/Events of Note  Received a call from hospitalist Dr Andria Frames around 40 min ago, case was discussed. Patient was on high flow o2 and is being switched to BIPAP. History of Covid and completed remdesivir and dexamethasone. Is on antibiotics. Was planned for CT PE protocol but could not be done due to high O2 needs.   eICU Interventions  Currently on 10/6/100% BIPAP O2 sat has improved to 92, RR is down from 45 to 30s, per RN (was given morphine for anxiety) No chest pain  Was given 60 mg lasix now Starting on a heparin infusion Continue antibiotics and steroids CXR is to be done Hospitalist is going to address code status - history of pancreatic cancer and prior DNR status Monitor closely, he said he is anxious and was also tachycardic. Precedex ordered to help with anxiety and to allow BIPAP use.      Intervention Category Major Interventions: Respiratory failure - evaluation and management Evaluation Type: New Patient Evaluation  Vincent [name] 08/21/2019, 2:12 AM   4.15 am Discussed with Dr Andria Frames, PCCM will evaluate in AM at bedside Improved on BIPAP CXR reviewed, bilateral infiltrates, minimal improvement from prior Has completed remdesivir, as well as 6 days of vanc, cefepime and remains on steroids Wean fio2 as tolerated Will write for LE dopplers as well, especially if cannot transport down due to Fio2 needs Full code for now If any of his hemodynamics worsen, he will need intubation

## 2019-08-21 NOTE — Consult Note (Addendum)
NAME:  Vincent [name], MRN:  [id number], DOB:  Jul 21, 1951, LOS: 76 ADMISSION DATE:  08/06/2019, CONSULTATION DATE: 1/14 REFERRING MD:  Dr. Avon Gully, CHIEF COMPLAINT:  SOB   Brief History   69 year old male with pancreatic cancer admitted 12/30 with acute hypoxemic respiratory failure in the setting of COVID-19 pneumonia.  He was treated with remdesivir and steroids with limited improvement.  PE ruled out with CTA chest.  Patient was treated empirically with broad-spectrum antibiotics for possible superimposed bacterial infection.  He decompensated on 1/14 requiring intubation.  History of present illness   69 year old male with metastatic pancreatic cancer admitted 12/30 with acute hypoxemic respiratory failure in the setting of COVID-19 pneumonia.    He was treated with remdesivir and steroids with limited improvement.  Patient continued to have high O2 needs with severe hypoxemia.  PE ruled out 12/31 & 1/9 with CTA chest.  Patient was started on empiric  broad-spectrum antibiotics 1/9 for possible superimposed bacterial infection.  Steroids were restarted on 1/12. He decompensated on 1/14 and was transferred to ICU.  He was treated with 60 mg Lasix, heparin infusion empirically and continued on vancomycin and cefepime.   The patient was placed on Precedex and BiPAP for increased work of breathing.  Prior to transfer the patient was DNR and he reversed his CODE STATUS to full code.   PCCM consulted for evaluation.    Past Medical History  Hypertension Diabetes Metastatic pancreatic cancer on chemotherapy  Significant Hospital Events   12/30 Admit   Consults:  PCCM  Procedures:    Significant Diagnostic Tests:  CTA chest 12/31 >>negative for pulmonary embolism.  Patchy groundglass opacities throughout all lobes CTA chest 1/9 >>negative for PE.  Mild coronary artery calcification, widespread groundglass opacities, suspect underlying fairly diffuse small airways obstructive  disease characterized by mosaic attenuation of the lung parenchyma at multiple sites, no evident adenopathy, suspected metastasis in the anterior segment of the right lobe of the liver  Micro Data:  BCx2 12/30 >> negative  BCx2 1/6 >> negative   Antimicrobials:  Cefepime 1/9 >>  Vanco 1/9 >>   Interim history/subjective:  Pt reports feeling much better this am.  RN reports precedex turned off.  Remains on NRB + heated high flow.   Objective   Blood pressure (!) 77/66, pulse 68, temperature 99 F (37.2 C), temperature source Oral, resp. rate (!) 25, height 6' 4" (1.93 m), weight 91.6 kg, SpO2 100 %.    Vent Mode: BIPAP FiO2 (%):  [100 %] 100 % Set Rate:  [8 bmp] 8 bmp PEEP:  [10 cmH20] 10 cmH20   Intake/Output Summary (Last 24 hours) at 08/21/2019 0804 Last data filed at 08/21/2019 0400 Gross per 24 hour  Intake 1494.72 ml  Output 1650 ml  Net -155.28 ml   Filed Weights   08/06/19 2031 08/21/19 0200  Weight: 105.2 kg 91.6 kg    Examination: General: adult male lying in bed on O2 in NAD HEENT: MM pink/moist, [place] O2, face mask in place Neuro: AAOx4, speech clear, MAE CV: s1s2 RRR, SR, no m/r/g PULM:  Even/non-labored, lungs bilaterally diminished anterior but clear GI: soft, bsx4 active  Extremities: warm/dry, no edema  Skin: no rashes or lesions  Resolved Hospital Problem list      Assessment & Plan:   Acute Hypoxemic Respiratory Failure secondary to COVID-19 Hx of chemotherapy for metastatic pancreatic cancer, last dose 12/15.  S/p steroid, remdesevir.  Acutely decompensated early am 1/14, tx to ICU. PE ruled  out x2 CTA this admit.   -continue heated high flow O2 -prone patient as able  -diurese as renal function / BP permit, s/p 60 mg IV lasix 1/14  -PNA prevention measures  -follow intermittent CXR  -continue solumedrol  -continue empiric abx with vanco, cefepime -empiric heparin gtt, doubt PE.  Would consider transition back to DVT prophylaxis given negative  CTA x2, D-dimer 3.  Doubt PE.  Defer transition to primary SVC.  -would not repeat CTA chest for 3rd time  -pulmonary hygiene, mobilize as able  HTN -monitor, hold home agents   DM II  -SSI  Anemia  -trend CBC  Metastatic Pancreatic Cancer  - Best practice:  Diet: NPO  Pain/Anxiety/Delirium protocol (if indicated): n/a  VAP protocol (if indicated): n/a DVT prophylaxis: Heparin, per primary GI prophylaxis: n/a Glucose control: SSI Mobility: BR Code Status: FULL, ongoing discussions per primary  Family Communication: Per TRH Disposition: ICU  PCCM will be available PRN.  Please call back if new needs arise.   Labs   CBC: Recent Labs  Lab 08/17/19 0635 08/17/19 0635 08/18/19 0600 08/18/19 0600 08/19/19 0600 08/20/19 0855 08/20/19 2301 08/21/19 0027 08/21/19 0236  WBC 13.3*  --  13.8*  --  16.1* 26.2*  --  22.9*  --   NEUTROABS 11.5*  --  12.2*  --  14.8* 24.6*  --  21.4*  --   HGB 11.5*   < > 9.5*   < > 12.4* 12.7* 13.6 12.8* 14.3  HCT 36.4*   < > 30.5*   < > 38.8* 39.0 40.0 38.8* 42.0  MCV 102.2*  --  105.2*  --  102.4* 101.8*  --  100.3*  --   PLT 215  --  151  --  206 276  --  256  --    < > = values in this interval not displayed.    Basic Metabolic Panel: Recent Labs  Lab 08/16/19 0330 08/16/19 0330 08/17/19 0635 08/18/19 0600 08/18/19 0600 08/19/19 0600 08/20/19 0855 08/20/19 2301 08/21/19 0027 08/21/19 0031 08/21/19 0236  NA 142   < >  --  138   < > 140 140 139 140  --  139  K 3.9   < >  --  4.5   < > 4.4 4.4 4.3 4.6  --  4.8  CL 103  --   --  100  --  104 105  --  106  --   --   CO2 27  --   --  24  --  26 25  --  22  --   --   GLUCOSE 106*  --   --  135*  --  163* 153*  --  184*  --   --   BUN 27*  --   --  18  --  25* 30*  --  31*  --   --   CREATININE 0.81  --   --  0.81  --  0.69 0.79  --  0.72  --   --   CALCIUM 8.6*  --   --  8.1*  --  8.7* 8.9  --  8.7*  --   --   MG 2.1   < > 2.0 2.0  --  2.5* 2.4  --   --  2.3  --   PHOS 3.6   <  > 3.5 3.0  --  3.9 3.9  --   --  3.5  --    < > =  values in this interval not displayed.   GFR: Estimated Creatinine Clearance: 111.5 mL/min (by C-G formula based on SCr of 0.72 mg/dL). Recent Labs  Lab 08/18/19 0600 08/19/19 0600 08/20/19 0855 08/21/19 0027  PROCALCITON  --   --  0.28 0.29  WBC 13.8* 16.1* 26.2* 22.9*    Liver Function Tests: Recent Labs  Lab 08/15/19 0020 08/16/19 0330 08/20/19 0855 08/21/19 0027  AST 59* 45* 32 42*  ALT 62* 67* 91* 108*  ALKPHOS 75 71 81 82  BILITOT 0.6 0.9 0.9 1.1  PROT 6.6 6.7 7.3 7.0  ALBUMIN 2.8* 3.0* 2.8* 2.6*   No results for input(s): LIPASE, AMYLASE in the last 168 hours. No results for input(s): AMMONIA in the last 168 hours.  ABG    Component Value Date/Time   PHART 7.393 08/21/2019 0236   PCO2ART 39.1 08/21/2019 0236   PO2ART 45.0 (L) 08/21/2019 0236   HCO3 23.8 08/21/2019 0236   TCO2 25 08/21/2019 0236   ACIDBASEDEF 1.0 08/21/2019 0236   O2SAT 80.0 08/21/2019 0236     Coagulation Profile: Recent Labs  Lab 08/21/19 0200  INR 1.2    Cardiac Enzymes: No results for input(s): CKTOTAL, CKMB, CKMBINDEX, TROPONINI in the last 168 hours.  HbA1C: Hgb A1c MFr Bld  Date/Time Value Ref Range Status  08/07/2019 01:47 AM 6.6 (H) 4.8 - 5.6 % Final    Comment:    (NOTE) Pre diabetes:          5.7%-6.4% Diabetes:              >6.4% Glycemic control for   <7.0% adults with diabetes   10/08/2017 02:55 PM 6.3 (H) 4.8 - 5.6 % Final    Comment:    (NOTE) Pre diabetes:          5.7%-6.4% Diabetes:              >6.4% Glycemic control for   <7.0% adults with diabetes     CBG: Recent Labs  Lab 08/20/19 0434 08/20/19 0849 08/20/19 1140 08/20/19 1950 08/21/19 0509  GLUCAP 183* 180* 216* 189* 271*    Review of Systems: Positives in [place]  Gen: Denies fever, chills, weight change, fatigue, night sweats HEENT: Denies blurred vision, double vision, hearing loss, tinnitus, sinus congestion, rhinorrhea, sore throat,  neck stiffness, dysphagia PULM: Denies shortness of breath, cough, sputum production, hemoptysis, wheezing CV: Denies chest pain, edema, orthopnea, paroxysmal nocturnal dyspnea, palpitations GI: Denies abdominal pain, nausea, vomiting, diarrhea, hematochezia, melena, constipation, change in bowel habits GU: Denies dysuria, hematuria, polyuria, oliguria, urethral discharge Endocrine: Denies hot or cold intolerance, polyuria, polyphagia or appetite change Derm: Denies rash, dry skin, scaling or peeling skin change Heme: Denies easy bruising, bleeding, bleeding gums Neuro: Denies headache, numbness, weakness, slurred speech, loss of memory or consciousness   Past Medical History  He,  has a past medical history of Diabetes mellitus without complication ([place]), Eye hemorrhage, Family history of lung cancer, Hypertension, Monoallelic mutation of BRIP1 gene, and Monoallelic mutation of MUTYH gene.   Surgical History    Past Surgical History:  Procedure Laterality Date  . ESOPHAGOGASTRODUODENOSCOPY (EGD) WITH PROPOFOL N/A 10/29/2018   Procedure: ESOPHAGOGASTRODUODENOSCOPY (EGD) WITH PROPOFOL;  Surgeon: Arta Silence, MD;  Location: [facility];  Service: Endoscopy;  Laterality: N/A;  . EUS N/A 10/29/2018   Procedure: FULL UPPER ENDOSCOPIC ULTRASOUND (EUS) RADIAL;  Surgeon: Arta Silence, MD;  Location: [facility];  Service: Endoscopy;  Laterality: N/A;  . FINE NEEDLE ASPIRATION N/A 10/29/2018  Procedure: FINE NEEDLE ASPIRATION (FNA) LINEAR;  Surgeon: Arta Silence, MD;  Location: [facility];  Service: Endoscopy;  Laterality: N/A;  . IR IMAGING GUIDED PORT INSERTION  11/11/2018  . LUMBAR LAMINECTOMY/DECOMPRESSION MICRODISCECTOMY Left 10/11/2017   Procedure: Microlumbar decompression Lumbar Four-Five Left;  Surgeon: Susa Day, MD;  Location: [facility];  Service: Orthopedics;  Laterality: Left;  Microlumbar decompression Lumbar Four-Five Left  . removal of wisdom teeth  1977     Social  History   reports that he has never smoked. He has never used smokeless tobacco. He reports current alcohol use. He reports that he does not use drugs.   Family History   His family history includes Cancer (age of onset: 34) in his mother.   Allergies No Known Allergies   Home Medications  Prior to Admission medications   Medication Sig Start Date End Date Taking? Authorizing Provider  Accu-Chek FastClix Lancets MISC daily. for testing 10/10/18  Yes [provider]  aspirin EC 81 MG tablet Take 1 tablet (81 mg total) by mouth daily after breakfast. Resume 4 days post-op 10/11/17  Yes Lacie Draft M, PA-C  eltrombopag (PROMACTA) 25 MG tablet Take 1 tablet (25 mg total) by mouth daily. Take on an empty stomach, 1 hour before a meal or 2 hours after. 07/23/19  Yes Truitt Merle, MD  gabapentin (NEURONTIN) 100 MG capsule Take 3 capsules (300 mg total) by mouth at bedtime. May gradually increase to 300 mg at night 07/23/19  Yes Burton, Wilhemina Cash, NP  Ginger, Zingiber officinalis, (GINGER ROOT PO) Take 750 mg by mouth.   Yes [provider]  KLOR-CON M20 20 MEQ tablet TAKE 1 TABLET (20 MEQ TOTAL) BY MOUTH EVERY OTHER DAY. Patient taking differently: Take 20 mEq by mouth every other day.  08/05/19  Yes Truitt Merle, MD  lidocaine-prilocaine (EMLA) cream Apply to affected area once 11/14/18  Yes Truitt Merle, MD  lisinopril (PRINIVIL,ZESTRIL) 10 MG tablet Take 10 mg by mouth daily as needed (elevated BP).    Yes [provider]  metFORMIN (GLUCOPHAGE) 1000 MG tablet Take 500-1,000 mg by mouth See admin instructions. 1,000 mg every morning, 500 mg every night 10/18/18  Yes [provider]  ondansetron (ZOFRAN) 8 MG tablet Take 1 tablet (8 mg total) by mouth 2 (two) times daily as needed for refractory nausea / vomiting. Start on day 3 after chemotherapy. 11/14/18  Yes Truitt Merle, MD  prochlorperazine (COMPAZINE) 10 MG tablet Take 1 tablet (10 mg total) by mouth every 6 (six) hours as  needed (NAUSEA). 05/06/19  Yes Alla Feeling, NP  simvastatin (ZOCOR) 40 MG tablet Take 20 mg by mouth every evening. 09/25/18  Yes [provider]  traMADol (ULTRAM) 50 MG tablet Take 2 tablets (100 mg total) by mouth every 6 (six) hours as needed. Patient taking differently: Take 100 mg by mouth every 6 (six) hours as needed for moderate pain or severe pain.  04/09/19  Yes Truitt Merle, MD     Critical care time: n/a    Noe Gens, MSN, NP-C [facility] 08/21/2019, 10:31 AM   Please see [email or web address] for pager details.

## 2019-08-21 NOTE — Progress Notes (Signed)
PROGRESS NOTE  Vincent [name]  [id number] DOB: 28-Jan-1951 DOA: 08/06/2019 PCP: Leonard Downing, MD  Outpatient Specialists: Dr. Burr Medico, oncology Brief Narrative: Vincent [name] is a 69 y.o. male with a history of T2DM, metastatic pancreatic CA on chemotherapy, and HTN who presented 12/30 with hypoxia due to covid-19 pneumonia. Since admission, a full course of remdesivir and steroids were given with limited improvement, persistent, now severe, hypoxemia. Steroids have been restarted, CTA chest has shown confluent pneumonia and no pulmonary embolism. Broad spectrum antibiotics have been added empirically, though the patient's respiratory status has only deteriorated.  Assessment & Plan: Principal Problem:   Pneumonia due to COVID-19 virus Active Problems:   Pancreatic cancer ([place])   Acute respiratory failure with hypoxia (HCC)   Type 2 diabetes mellitus (HCC)   HLD (hyperlipidemia)  Goals of care DO NOT RESUSCITATE -Lengthy discussion today at bedside with patient and again with wife over the phone, more than 30 minutes was spent discussing patient's prognosis, goals of care and current condition -Patient was made full code overnight in "the heat of the moment" per the patient, after lengthy discussion today with patient and again with wife we will transition patient back to DNR per discussion that a ventilator or CPR would not change patient's ultimate outcome and would likely only increase patient's suffering and decreased quality of life which is their main goal -At this time family and patient are considering palliative care medicine consult, given patient has reached maximal medical therapy with no ability to delay care at this point given we are not considering invasive or noninvasive positive pressure ventilation we can only continue to offer supportive care as well as heated high flow -Family to have goals of care discussion today amongst the patient his wife their 2  sons as well as the patient's sister. -At this time we also discussed that should patient become a comfort measure/care patient we could consider visitation as well. -Given patient's diagnosis of metastatic pancreatic cancer on palliative treatment with Dr. Morey Hummingbird certainly palliative care and comfort measures would be reasonable transition for the patient.   Acute hypoxemic respiratory failure due to covid-19 pneumonia, possible superimposed bacterial pneumonia: SARS-CoV-2 Ag+ 12/30. Protracted hospitalization in immunocompromised patient. CTA chest 1/9 without evidence of PE. -Overnight patient had episode of acute hypoxia requiring BiPAP and transition to ICU -Likely discussion as above BiPAP nor intubation are reasonable supportive devices given the patient's history -Patient now on heated high flow, 40 L at 100% with sats in the mid 80s on nonrebreather overlying to keep sats in the high 80s -Lengthy discussion as above given patient's worsening condition without new modality or additional medications to offer, patient is medically maximized at this time -Discussed comfort measures and palliative care as above, awaiting family's decision. SpO2: (!) 80 % O2 Flow Rate (L/min): 40 L/min FiO2 (%): 100 %  Recent Labs    08/19/19 0600 08/20/19 0855 08/21/19 0027 08/21/19 0028  DDIMER 2.33* 1.03* 3.00*  --   CRP 21.2* 8.4*  --  4.7*  - s/p remdesivir x5 days stop 1/4 - Steroids ongoing since 12/30. 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 Most recently received promacta on  1/4 for marrow stimulation, though this was stopped due to thrombosis risk.  - Patient's oncologist is aware of admission. Continue to delay chemotherapy at this time.  T2DM:  - Well-controlled chronically, HbA1c 6.6%.  - Increase sliding scale insulin to resistant during acute illness and ongoing use of steroids. -Add Lantus 20u HS  Hyperlipidemia:  - Continue statin. LFTs not severely elevated.  Anemia of malignancy:  - Stable, no evidence of blood loss.  - Continue monitoring.   DVT prophylaxis: Lovenox 0.5mg /kg q12h Code Status: Patient transition to back to DNR after lengthy discussion with patient and wife on 08/21/2019  Family Communication: Wife by phone - over 30 minutes spent discussing goals of care, current illness, and prognosis Disposition Plan: Patient's prognosis is quite grim at this time, given worsening respiratory status without ability to escalate care.  Lengthy discussion with patient and wife about prognosis, worsening condition, given chronic comorbid conditions patient remains high risk at baseline while on palliative chemotherapy.  Continue to discuss possible comfort measures and palliative care as patient's current condition continues to worsen.  Consultants:   Palliative care medicine team  Procedures:   None  Antimicrobials:  Remdesivir  Cefepime, vancomycin   Subjective: Overnight patient had acute episode of hypoxia, ABG notable for PaO2 in the 30s.  Patient subsequently was made full code and sent to ICU for possible intubation, improved on BiPAP and ultimately transition to heated high flow.  Lengthy discussion this morning at bedside with patient and again over the phone with wife as above about patient's poor prognosis, intubation and CPR would not behoove patient and ultimately patient was made DNR after our discussion.  Objective: Vitals:   08/21/19 0900 08/21/19 1000 08/21/19 1100 08/21/19 1200  BP: (!) 81/62 (!) 86/67 92/61 102/76   Pulse: 66 74 76 81  Resp: (!) 22 (!) 29 (!) 24 (!) 26  Temp:    98.1 F (36.7 C)  TempSrc:      SpO2: 93% 99% (!) 87% (!) 80%  Weight:      Height:        Intake/Output Summary (Last 24 hours) at 08/21/2019 1238 Last data filed at 08/21/2019 0400 Gross per 24 hour  Intake 814.72 ml  Output 1250 ml  Net -435.28 ml   Filed Weights   08/06/19 2031 08/21/19 0200  Weight: 105.2 kg 91.6 kg    Gen: Acutely and chronically ill-appearing male in no acute distress Pulm: Tachypneic, mildly labored rating nonrebreather  CV: Regular tachycardia. No murmur, rub, or gallop. No JVD, no pedal edema. GI: Abdomen soft, non-tender, non-distended, with normoactive bowel sounds. No organomegaly or masses felt. Ext: Warm, decreased muscle bulk Skin: No rashes, lesions or ulcers Neuro: Alert and oriented. No focal neurological deficits. Psych: Judgement and insight appear normal. Mood & affect appropriate.   Data Reviewed: I have personally reviewed following labs and imaging studies  CBC: Recent Labs  Lab 08/17/19 0635 08/17/19 0635 08/18/19 0600 08/18/19 0600 08/19/19 0600 08/20/19 0855 08/20/19 2301 08/21/19 0027 08/21/19 0236  WBC 13.3*  --  13.8*  --  16.1* 26.2*  --  22.9*  --   NEUTROABS 11.5*  --  12.2*  --  14.8* 24.6*  --  21.4*  --   HGB 11.5*   < > 9.5*   < > 12.4* 12.7* 13.6 12.8* 14.3  HCT 36.4*   < > 30.5*   < > 38.8* 39.0 40.0 38.8*  42.0  MCV 102.2*  --  105.2*  --  102.4* 101.8*  --  100.3*  --   PLT 215  --  151  --  206 276  --  256  --    < > = values in this interval not displayed.   Basic Metabolic Panel: Recent Labs  Lab 08/16/19 0330 08/16/19 0330 08/17/19 0635 08/18/19 0600 08/18/19 0600 08/19/19 0600 08/20/19 0855 08/20/19 2301 08/21/19 0027 08/21/19 0031 08/21/19 0236  NA 142   < >  --  138   < > 140 140 139 140  --  139  K 3.9   < >  --  4.5   < > 4.4 4.4 4.3 4.6  --  4.8  CL 103  --   --  100  --  104 105  --  106  --   --   CO2 27  --   --   24  --  26 25  --  22  --   --   GLUCOSE 106*  --   --  135*  --  163* 153*  --  184*  --   --   BUN 27*  --   --  18  --  25* 30*  --  31*  --   --   CREATININE 0.81  --   --  0.81  --  0.69 0.79  --  0.72  --   --   CALCIUM 8.6*  --   --  8.1*  --  8.7* 8.9  --  8.7*  --   --   MG 2.1   < > 2.0 2.0  --  2.5* 2.4  --   --  2.3  --   PHOS 3.6   < > 3.5 3.0  --  3.9 3.9  --   --  3.5  --    < > = values in this interval not displayed.   GFR: Estimated Creatinine Clearance: 111.5 mL/min (by C-G formula based on SCr of 0.72 mg/dL). Liver Function Tests: Recent Labs  Lab 08/15/19 0020 08/16/19 0330 08/20/19 0855 08/21/19 0027  AST 59* 45* 32 42*  ALT 62* 67* 91* 108*  ALKPHOS 75 71 81 82  BILITOT 0.6 0.9 0.9 1.1  PROT 6.6 6.7 7.3 7.0  ALBUMIN 2.8* 3.0* 2.8* 2.6*   CBG: Recent Labs  Lab 08/20/19 1140 08/20/19 1638 08/20/19 1950 08/21/19 0509 08/21/19 0756  GLUCAP 216* 251* 189* 271* 224*   Urine analysis:    Component Value Date/Time   COLORURINE YELLOW 08/04/2019 [place] 08/04/2019 1119   LABSPEC 1.021 08/04/2019 1119   PHURINE 5.0 08/04/2019 1119   GLUCOSEU NEGATIVE 08/04/2019 1119   HGBUR NEGATIVE 08/04/2019 1119   BILIRUBINUR NEGATIVE 08/04/2019 1119   KETONESUR 5 (A) 08/04/2019 1119   PROTEINUR 30 (A) 08/04/2019 1119   NITRITE NEGATIVE 08/04/2019 1119   LEUKOCYTESUR NEGATIVE 08/04/2019 1119   Recent Results (from the past 240 hour(s))  Culture, blood (routine x 2)     Status: None   Collection Time: 08/13/19  1:47 PM   Specimen: BLOOD  Result Value Ref Range Status   Specimen Description   Final    BLOOD RIGHT ARM Performed at [facility] [address], [place], [place] [address]    Special Requests   Final    BOTTLES DRAWN AEROBIC ONLY Blood Culture adequate volume Performed at [facility]  [address], [place] [address]    Culture   Final    NO GROWTH 5 DAYS Performed at [facility], [place] [address] [place], [place] [address]    Report Status 08/18/2019 FINAL  Final  Culture, blood (routine x 2)     Status: None   Collection Time: 08/13/19  2:00 PM   Specimen: BLOOD  Result Value Ref Range Status   Specimen Description   Final    BLOOD RIGHT ARM Performed at [facility] [address] [place], [place] [address]    Special Requests   Final    BOTTLES DRAWN AEROBIC ONLY Blood Culture adequate volume Performed at [facility] [address], [place], [place] [address]    Culture   Final    NO GROWTH 5 DAYS Performed at [facility], [place] [address], [place], [place] [address]    Report Status 08/18/2019 FINAL  Final      Radiology Studies: DG CHEST PORT 1 VIEW  Result Date: 08/21/2019 CLINICAL DATA:  Shortness of breath EXAM: PORTABLE CHEST 1 VIEW COMPARISON:  August 18, 2019 FINDINGS: The heart size and mediastinal contours are unchanged. Aortic knob calcifications. A right-sided MediPort catheter with the tip at the superior cavoatrial junction. Multifocal patchy/ground-glass opacities are seen throughout both lungs. There is slight interval improvement in the opacities within the left lung. no acute osseous abnormality. IMPRESSION: Diffuse multifocal bilateral airspace opacities with slight interval improvement in the left lung. Electronically Signed   By: Prudencio Pair M.D.   On: 08/21/2019 03:02    Scheduled Meds: . vitamin C  500 mg Oral BID  . aspirin EC  81 mg Oral QPC breakfast  . Chlorhexidine Gluconate Cloth  6 each Topical Daily  . cholecalciferol  2,000 Units Oral Daily  . feeding supplement (ENSURE ENLIVE)  237 mL Oral TID BM  . gabapentin  300 mg Oral QHS  . insulin aspart  0-15 Units Subcutaneous Q4H  . insulin aspart  4 Units Subcutaneous TID WC  . methylPREDNISolone (SOLU-MEDROL) injection  40 mg Intravenous Q8H  . simvastatin  20 mg Oral QPM  . zinc sulfate  220 mg Oral Daily    Continuous Infusions:    LOS: 14 days   Time spent: 35 minutes.  Little Ishikawa, DO [facility] Pager: Epic chat 08/21/2019, 12:38 PM

## 2019-08-22 LAB — CBC WITH DIFFERENTIAL/PLATELET
Abs Immature Granulocytes: 0.11 10*3/uL — ABNORMAL HIGH (ref 0.00–0.07)
Basophils Absolute: 0 10*3/uL (ref 0.0–0.1)
Basophils Relative: 0 %
Eosinophils Absolute: 0 10*3/uL (ref 0.0–0.5)
Eosinophils Relative: 0 %
HCT: 38.1 % — ABNORMAL LOW (ref 39.0–52.0)
Hemoglobin: 12.1 g/dL — ABNORMAL LOW (ref 13.0–17.0)
Immature Granulocytes: 1 %
Lymphocytes Relative: 2 %
Lymphs Abs: 0.5 10*3/uL — ABNORMAL LOW (ref 0.7–4.0)
MCH: 31.8 pg (ref 26.0–34.0)
MCHC: 31.8 g/dL (ref 30.0–36.0)
MCV: 100.3 fL — ABNORMAL HIGH (ref 80.0–100.0)
Monocytes Absolute: 0.8 10*3/uL (ref 0.1–1.0)
Monocytes Relative: 4 %
Neutro Abs: 17.1 10*3/uL — ABNORMAL HIGH (ref 1.7–7.7)
Neutrophils Relative %: 93 %
Platelets: 226 10*3/uL (ref 150–400)
RBC: 3.8 MIL/uL — ABNORMAL LOW (ref 4.22–5.81)
RDW: 14 % (ref 11.5–15.5)
WBC: 18.5 10*3/uL — ABNORMAL HIGH (ref 4.0–10.5)
nRBC: 0 % (ref 0.0–0.2)

## 2019-08-22 LAB — MRSA PCR SCREENING: MRSA by PCR: NEGATIVE

## 2019-08-22 LAB — MAGNESIUM: Magnesium: 2.4 mg/dL (ref 1.7–2.4)

## 2019-08-22 LAB — PHOSPHORUS: Phosphorus: 3.2 mg/dL (ref 2.5–4.6)

## 2019-08-22 LAB — GLUCOSE, CAPILLARY
Glucose-Capillary: 186 mg/dL — ABNORMAL HIGH (ref 70–99)
Glucose-Capillary: 132 mg/dL — ABNORMAL HIGH (ref 70–99)
Glucose-Capillary: 309 mg/dL — ABNORMAL HIGH (ref 70–99)
Glucose-Capillary: 232 mg/dL — ABNORMAL HIGH (ref 70–99)

## 2019-08-22 LAB — D-DIMER, QUANTITATIVE: D-Dimer, Quant: 3.08 ug/mL-FEU — ABNORMAL HIGH (ref 0.00–0.50)

## 2019-08-22 LAB — PROCALCITONIN: Procalcitonin: 0.28 ng/mL

## 2019-08-22 LAB — C-REACTIVE PROTEIN: CRP: 3.3 mg/dL — ABNORMAL HIGH (ref <1.0)

## 2019-08-22 MED ORDER — ORAL CARE MOUTH RINSE
15.0000 mL | Freq: Two times a day (BID) | OROMUCOSAL | Status: DC
  Administered 2019-08-22 – 2019-09-03 (×23): 15 mL via OROMUCOSAL

## 2019-08-22 NOTE — Progress Notes (Signed)
PROGRESS NOTE  Vincent [name]  [id number] DOB: [birth year] DOA: 08/06/2019 PCP: Leonard Downing, MD  Outpatient Specialists: Dr. Burr Medico, oncology Brief Narrative: Vincent [name] is a 69 y.o. male with a history of T2DM, metastatic pancreatic CA on chemotherapy, and HTN who presented 12/30 with hypoxia due to covid-19 pneumonia. Since admission, a full course of remdesivir and steroids were given with limited improvement, persistent, now severe, hypoxemia. Steroids have been restarted, CTA chest has shown confluent pneumonia and no pulmonary embolism. Broad spectrum antibiotics have been added empirically, though the patient's respiratory status has only deteriorated.  Assessment & Plan: Principal Problem:   Pneumonia due to COVID-19 virus Active Problems:   Pancreatic cancer ([place])   Acute respiratory failure with hypoxia (HCC)   Type 2 diabetes mellitus (HCC)   HLD (hyperlipidemia)   Goals of care DO NOT RESUSCITATE -Lengthy discussion daily at bedside with patient and again with wife over the phone, more than 30 minutes was spent discussing patient's prognosis, goals of care and current condition -Continue DNR per discussion that a ventilator or CPR would not change patient's ultimate outcome and would likely only increase patient's suffering and decreased quality of life which is their main goal -At this time family and patient are considering palliative care medicine consult, given patient has reached maximal medical therapy with no ability to advance care at this point given we are not considering invasive or noninvasive positive pressure ventilation and we can only continue to offer supportive care as well as heated high flow -Family to have goals of care discussion amongst the patient his wife their 2 sons as well as the patient's sister, unclear if this has happened yet. -Family able to visit on 08/21/19 as patient was deteriorating and we were concerned for demise -  somewhat more stable over the past 24h -Given patient's diagnosis of metastatic pancreatic cancer on palliative treatment with Dr. Morey Hummingbird certainly palliative care and comfort measures would be reasonable transition for the patient.   Acute hypoxemic respiratory failure due to covid-19 pneumonia, possible superimposed bacterial pneumonia: SARS-CoV-2 Ag+ 12/30. Protracted hospitalization in immunocompromised patient. CTA chest 1/9 without evidence of PE. -Overnight patient had episode of acute hypoxia requiring BiPAP and transition to ICU -Likely discussion as above BiPAP nor intubation are reasonable supportive devices given the patient's history -Patient now on heated high flow, 40 L at 100% with occasional NRB overlying -Lengthy discussion as above given patient's worsening condition without new modality or additional medications to offer, patient is medically maximized at this time -Discussed comfort measures and palliative care as above, awaiting family's decision. SpO2: 92 % O2 Flow Rate (L/min): 40 L/min FiO2 (%): 100 %  Recent Labs    08/20/19 0855 08/21/19 0027 08/21/19 0028 08/22/19 0450  DDIMER 1.03* 3.00*  --  3.08*  CRP 8.4*  --  4.7* 3.3*  - s/p remdesivir x5 days stop 1/4 - Steroids ongoing since 12/30. 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 Most recently received promacta on 1/4 for marrow stimulation, though this was stopped due to thrombosis risk.  - Patient's oncologist is aware of admission. Continue to delay  chemotherapy at this time.  T2DM:  -  Well-controlled chronically, HbA1c 6.6%.  - Increase sliding scale insulin to resistant during acute illness and ongoing use of steroids. -Add Lantus 20u HS  Hyperlipidemia:  - Continue statin. LFTs not severely elevated.  Anemia of malignancy:  - Stable, no evidence of blood loss.  - Continue monitoring.   DVT prophylaxis: Lovenox 0.5mg /kg q12h Code Status: Patient transition to back to DNR after lengthy discussion with patient and wife on 08/21/2019  Family Communication: Wife by phone - over 30 minutes spent discussing goals of care, current illness, and prognosis Disposition Plan: Patient's prognosis is quite grim at this time, given worsening respiratory status without ability to escalate care.  Lengthy discussion with patient and wife about prognosis, worsening condition, given chronic comorbid conditions patient remains high risk at baseline while on palliative chemotherapy.  Continue to discuss possible comfort measures and palliative care as patient's current condition continues to worsen.  Consultants:   Palliative care medicine team  Procedures:   None  Antimicrobials:  Remdesivir  Cefepime, vancomycin   Subjective: Overnight patient had acute episode of hypoxia, ABG notable for PaO2 in the 30s.  Patient subsequently was made full code and sent to ICU for possible intubation, improved on BiPAP and ultimately transition to heated high flow.  Lengthy discussion this morning at bedside with patient and again over the phone with wife as above about patient's poor prognosis, intubation and CPR would not behoove patient and ultimately patient was made DNR after our discussion.  Objective: Vitals:   08/22/19 1000 08/22/19 1100 08/22/19 1140 08/22/19 1319  BP: 102/79 (!) 105/91  (!) 119/92  Pulse: (!) 110 (!) 113  (!) 116  Resp: (!) 27 (!) 26  (!) 28  Temp:   98.1 F (36.7 C) (!) 97 F (36.1 C)  TempSrc:   Oral Oral  SpO2:    92%  Weight:      Height:         Intake/Output Summary (Last 24 hours) at 08/22/2019 1455 Last data filed at 08/22/2019 0700 Gross per 24 hour  Intake 856.14 ml  Output 1629 ml  Net -772.86 ml   Filed Weights   08/06/19 2031 08/21/19 0200  Weight: 105.2 kg 91.6 kg    Gen: Acutely and chronically ill-appearing male in no acute distress Pulm: Tachypneic, mildly labored rating nonrebreather  CV: Regular tachycardia. No murmur, rub, or gallop. No JVD, no pedal edema. GI: Abdomen soft, non-tender, non-distended, with normoactive bowel sounds. No organomegaly or masses felt. Ext: Warm, decreased muscle bulk Skin: No rashes, lesions or ulcers Neuro: Alert and oriented. No focal neurological deficits. Psych: Judgement and insight appear normal. Mood & affect appropriate.   Data Reviewed: I have personally reviewed following labs and imaging studies  CBC: Recent Labs  Lab 08/18/19 0600 08/18/19 0600 08/19/19 0600 08/19/19 0600 08/20/19 0855 08/20/19 2301 08/21/19 0027 08/21/19 0236 08/22/19 0450  WBC 13.8*  --  16.1*  --  26.2*  --  22.9*  --  18.5*  NEUTROABS 12.2*  --  14.8*  --  24.6*  --  21.4*  --  17.1*  HGB 9.5*   < > 12.4*   < > 12.7* 13.6 12.8* 14.3 12.1*  HCT 30.5*   < > 38.8*   < > 39.0 40.0 38.8* 42.0 38.1*  MCV 105.2*  --  102.4*  --  101.8*  --  100.3*  --  100.3*  PLT 151  --  206  --  276  --  256  --  226   < > = values in this interval not displayed.   Basic Metabolic Panel: Recent Labs  Lab 08/16/19 0330 08/17/19 0635 08/18/19 0600 08/18/19 0600 08/19/19 0600 08/20/19 0855 08/20/19 2301 08/21/19 0027 08/21/19 0031 08/21/19 0236 08/22/19 0450  NA 142  --  138   < > 140 140 139 140  --  139  --   K 3.9  --  4.5   < > 4.4 4.4 4.3 4.6  --  4.8  --   CL 103  --  100  --  104 105  --  106  --   --   --   CO2 27  --  24  --  26 25  --  22  --   --   --   GLUCOSE 106*  --  135*  --  163* 153*  --  184*  --   --   --   BUN 27*  --  18  --  25* 30*  --  31*  --   --   --    CREATININE 0.81  --  0.81  --  0.69 0.79  --  0.72  --   --   --   CALCIUM 8.6*  --  8.1*  --  8.7* 8.9  --  8.7*  --   --   --   MG 2.1   < > 2.0  --  2.5* 2.4  --   --  2.3  --  2.4  PHOS 3.6   < > 3.0  --  3.9 3.9  --   --  3.5  --  3.2   < > = values in this interval not displayed.   GFR: Estimated Creatinine Clearance: 111.5 mL/min (by C-G formula based on SCr of 0.72 mg/dL). Liver Function Tests: Recent Labs  Lab 08/16/19 0330 08/20/19 0855 08/21/19 0027  AST 45* 32 42*  ALT 67* 91* 108*  ALKPHOS 71 81 82  BILITOT 0.9 0.9 1.1  PROT 6.7 7.3 7.0  ALBUMIN 3.0* 2.8* 2.6*   CBG: Recent Labs  Lab 08/21/19 1242 08/21/19 1722 08/21/19 2149 08/22/19 0731 08/22/19 1121  GLUCAP 132* 222* 222* 186* 132*   Urine analysis:    Component Value Date/Time   COLORURINE YELLOW 08/04/2019 [place] 08/04/2019 1119   LABSPEC 1.021 08/04/2019 1119   PHURINE 5.0 08/04/2019 1119   GLUCOSEU NEGATIVE 08/04/2019 1119   HGBUR NEGATIVE 08/04/2019 1119   BILIRUBINUR NEGATIVE 08/04/2019 1119   KETONESUR 5 (A) 08/04/2019 1119   PROTEINUR 30 (A) 08/04/2019 1119   NITRITE NEGATIVE 08/04/2019 1119   LEUKOCYTESUR NEGATIVE 08/04/2019 1119   Recent Results (from the past 240 hour(s))  Culture, blood (routine x 2)     Status: None   Collection Time: 08/13/19  1:47 PM   Specimen: BLOOD  Result Value Ref Range Status   Specimen Description   Final    BLOOD RIGHT ARM Performed at [facility] [address], [place], [place] [address]    Special Requests   Final    BOTTLES DRAWN AEROBIC ONLY Blood Culture adequate volume Performed at [facility] [address], [place], [place] [address]    Culture   Final    NO GROWTH 5 DAYS Performed at [facility], [place] [address], [place], [place] [address]    Report Status 08/18/2019 FINAL  Final  Culture, blood (routine x 2)     Status: None   Collection Time: 08/13/19  2:00 PM    Specimen: BLOOD  Result Value Ref Range Status   Specimen Description   Final    BLOOD RIGHT ARM Performed at [facility] [address], [place], [place] [address]    Special Requests   Final    BOTTLES DRAWN AEROBIC ONLY Blood Culture adequate volume Performed at [facility] [address], [place], [place] [address]    Culture   Final    NO GROWTH 5 DAYS Performed at [facility], [place] [address] [place], [place] [address]    Report Status 08/18/2019 FINAL  Final  MRSA PCR Screening     Status: None   Collection Time: 08/22/19  4:50 AM   Specimen: Nasal Mucosa; Nasopharyngeal  Result Value Ref Range Status   MRSA by PCR NEGATIVE NEGATIVE Final    Comment:        The GeneXpert MRSA Assay (FDA approved for NASAL specimens only), is one component of a comprehensive MRSA colonization surveillance program. It is not intended to diagnose MRSA infection nor to guide or monitor treatment for MRSA infections. Performed at [facility], [place] [address] [place], [place] [address]       Radiology Studies: DG CHEST PORT 1 VIEW  Result Date: 08/21/2019 CLINICAL DATA:  Shortness of breath EXAM: PORTABLE CHEST 1 VIEW COMPARISON:  August 18, 2019 FINDINGS: The heart size and mediastinal contours are unchanged. Aortic knob calcifications. A right-sided MediPort catheter with the tip at the superior cavoatrial junction. Multifocal patchy/ground-glass opacities are seen throughout both lungs. There is slight interval improvement in the opacities within the left lung. no acute osseous abnormality. IMPRESSION: Diffuse multifocal bilateral airspace opacities with slight interval improvement in the left lung. Electronically Signed   By: Prudencio Pair M.D.   On: 08/21/2019 03:02   VAS [place] LOWER EXTREMITY VENOUS (DVT)  Result Date: 08/21/2019  Lower Venous Study Indications: Elevated d-dimer. COVID positive.  Comparison Study: No  prior study. Performing Technologist: Maudry Mayhew MHA, RDMS, RVT, RDCS  Examination Guidelines: A complete evaluation includes B-mode imaging, spectral Doppler, color Doppler, and power Doppler as needed of all accessible portions of each vessel. Bilateral testing is considered an integral part of a complete examination. Limited examinations for reoccurring indications may be performed as noted.  +---------+---------------+---------+-----------+----------+--------------+ RIGHT    CompressibilityPhasicitySpontaneityPropertiesThrombus Aging +---------+---------------+---------+-----------+----------+--------------+ CFV      Full           Yes      Yes                                 +---------+---------------+---------+-----------+----------+--------------+ SFJ      Full                                                        +---------+---------------+---------+-----------+----------+--------------+ FV Prox  Full                                                        +---------+---------------+---------+-----------+----------+--------------+  FV Mid   Full                                                        +---------+---------------+---------+-----------+----------+--------------+ FV DistalFull                                                        +---------+---------------+---------+-----------+----------+--------------+ PFV      Full                                                        +---------+---------------+---------+-----------+----------+--------------+ POP      Full           Yes      Yes                                 +---------+---------------+---------+-----------+----------+--------------+ PTV      Full                                                        +---------+---------------+---------+-----------+----------+--------------+ PERO     Full                                                         +---------+---------------+---------+-----------+----------+--------------+   +---------+---------------+---------+-----------+----------+--------------+ LEFT     CompressibilityPhasicitySpontaneityPropertiesThrombus Aging +---------+---------------+---------+-----------+----------+--------------+ CFV      Full           Yes      Yes                                 +---------+---------------+---------+-----------+----------+--------------+ SFJ      Full                                                        +---------+---------------+---------+-----------+----------+--------------+ FV Prox  Full                                                        +---------+---------------+---------+-----------+----------+--------------+ FV Mid   Full                                                        +---------+---------------+---------+-----------+----------+--------------+  FV DistalFull                                                        +---------+---------------+---------+-----------+----------+--------------+ PFV      Full                                                        +---------+---------------+---------+-----------+----------+--------------+ POP      Full           Yes      Yes                                 +---------+---------------+---------+-----------+----------+--------------+ PTV      Full                                                        +---------+---------------+---------+-----------+----------+--------------+ PERO     Full                                                        +---------+---------------+---------+-----------+----------+--------------+     Summary: Right: There is no evidence of deep vein thrombosis in the lower extremity. No cystic structure found in the popliteal fossa. Left: There is no evidence of deep vein thrombosis in the lower extremity. No cystic structure found in the popliteal fossa.  *See  table(s) above for measurements and observations.    Preliminary     Scheduled Meds: . vitamin C  500 mg Oral BID  . aspirin EC  81 mg Oral QPC breakfast  . Chlorhexidine Gluconate Cloth  6 each Topical Daily  . cholecalciferol  2,000 Units Oral Daily  . enoxaparin (LOVENOX) injection  40 mg Subcutaneous Q12H  . feeding supplement (ENSURE ENLIVE)  237 mL Oral TID BM  . gabapentin  300 mg Oral QHS  . insulin aspart  0-20 Units Subcutaneous TID AC & HS  . insulin aspart  4 Units Subcutaneous TID WC  . insulin glargine  20 Units Subcutaneous QHS  . mouth rinse  15 mL Mouth Rinse BID  . methylPREDNISolone (SOLU-MEDROL) injection  40 mg Intravenous Q8H  . simvastatin  20 mg Oral QPM  . zinc sulfate  220 mg Oral Daily   Continuous Infusions:    LOS: 15 days   Time spent: 35 minutes.  Little Ishikawa, DO [facility] Pager: Epic chat 08/22/2019, 2:55 PM

## 2019-08-22 NOTE — Progress Notes (Signed)
Nutrition Follow-up  DOCUMENTATION CODES:   Not applicable  INTERVENTION:     Continue Ensure Enlive po TID, each supplement provides 350 kcal and 20 grams of protein.  Continue Magic cup BID with lunch and dinner, each supplement provides 290 kcal and 9 grams of protein.   NUTRITION DIAGNOSIS:   Increased nutrient needs related to acute illness(COVID PNA) as evidenced by estimated needs.  Ongoing  GOAL:   Patient will meet greater than or equal to 90% of their needs   Progressing  MONITOR:   PO intake, Supplement acceptance, Labs  ASSESSMENT:   69 yo male admitted with acute respiratory failure related to COVID-19 PNA. PMH includes DM-2, HTN, HLD, metastatic pancreatic cancer, last chemo 12/16.   Patient is currently on a CHO modified diet consuming 50-75% of meals 1/13. No documentation of meal intake available since 1/13. He is also drinking Ensure Enlive 2-3 times every day.   Currently on high flow oxygen and NRB mask.  Labs reviewed.  CBG's: [phone]  Medications reviewed.   Weight on 1/14 down to 91.6 kg from 99.7 kg on 12/30. 8% weight loss within the past month is significant for the time frame. Suspect patient is malnourished, unable to obtain enough information at this time for identification of malnutrition.   NUTRITION - FOCUSED PHYSICAL EXAM:  unable to complete due to COVID restrictions  Diet Order:   Diet Order            Diet Carb Modified Fluid consistency: Thin; Room service appropriate? Yes  Diet effective now              EDUCATION NEEDS:   Not appropriate for education at this time  Skin:  Skin Assessment: Reviewed RN Assessment  Last BM:  1/12  Height:   Ht Readings from Last 1 Encounters:  08/21/19 6\' 4"  (1.93 m)    Weight:   Wt Readings from Last 1 Encounters:  08/21/19 91.6 kg    Ideal Body Weight:  91.8 kg  BMI:  Body mass index is 24.58 kg/m.  Estimated Nutritional Needs:   Kcal:  [id number]  Protein:   130-150 gm  Fluid:  >/= 2.5 L    Molli Barrows, RD, LDN, [place] Pager [phone] After Hours Pager [phone]

## 2019-08-22 NOTE — Progress Notes (Signed)
Pt's spouse called for short update on pt's night.  Pt slept some.  Sats staying mostly between 90-92% thru the night.

## 2019-08-23 LAB — CBC WITH DIFFERENTIAL/PLATELET
Abs Immature Granulocytes: 0.22 10*3/uL — ABNORMAL HIGH (ref 0.00–0.07)
Basophils Absolute: 0 10*3/uL (ref 0.0–0.1)
Basophils Relative: 0 %
Eosinophils Absolute: 0 10*3/uL (ref 0.0–0.5)
Eosinophils Relative: 0 %
HCT: 41.1 % (ref 39.0–52.0)
Hemoglobin: 13.4 g/dL (ref 13.0–17.0)
Immature Granulocytes: 1 %
Lymphocytes Relative: 2 %
Lymphs Abs: 0.3 10*3/uL — ABNORMAL LOW (ref 0.7–4.0)
MCH: 32.5 pg (ref 26.0–34.0)
MCHC: 32.6 g/dL (ref 30.0–36.0)
MCV: 99.8 fL (ref 80.0–100.0)
Monocytes Absolute: 1 10*3/uL (ref 0.1–1.0)
Monocytes Relative: 5 %
Neutro Abs: 18.7 10*3/uL — ABNORMAL HIGH (ref 1.7–7.7)
Neutrophils Relative %: 92 %
Platelets: 292 10*3/uL (ref 150–400)
RBC: 4.12 MIL/uL — ABNORMAL LOW (ref 4.22–5.81)
RDW: 14.1 % (ref 11.5–15.5)
WBC: 20.3 10*3/uL — ABNORMAL HIGH (ref 4.0–10.5)
nRBC: 0 % (ref 0.0–0.2)

## 2019-08-23 LAB — GLUCOSE, CAPILLARY
Glucose-Capillary: 135 mg/dL — ABNORMAL HIGH (ref 70–99)
Glucose-Capillary: 180 mg/dL — ABNORMAL HIGH (ref 70–99)
Glucose-Capillary: 182 mg/dL — ABNORMAL HIGH (ref 70–99)
Glucose-Capillary: 208 mg/dL — ABNORMAL HIGH (ref 70–99)
Glucose-Capillary: 89 mg/dL (ref 70–99)
Glucose-Capillary: 91 mg/dL (ref 70–99)

## 2019-08-23 LAB — D-DIMER, QUANTITATIVE: D-Dimer, Quant: 3.56 ug/mL-FEU — ABNORMAL HIGH (ref 0.00–0.50)

## 2019-08-23 LAB — C-REACTIVE PROTEIN: CRP: 3.2 mg/dL — ABNORMAL HIGH (ref <1.0)

## 2019-08-23 LAB — MAGNESIUM: Magnesium: 2.6 mg/dL — ABNORMAL HIGH (ref 1.7–2.4)

## 2019-08-23 LAB — PHOSPHORUS: Phosphorus: 3.2 mg/dL (ref 2.5–4.6)

## 2019-08-23 MED ORDER — ENOXAPARIN SODIUM 40 MG/0.4ML ~~LOC~~ SOLN
40.0000 mg | SUBCUTANEOUS | Status: DC
  Administered 2019-08-23 – 2019-09-02 (×11): 40 mg via SUBCUTANEOUS
  Filled 2019-08-23 (×11): qty 0.4

## 2019-08-23 NOTE — Progress Notes (Signed)
Rapid Response Event Note  Overview:  Rapid response called overhead for Vincent [name] at 48.  On initial assessment I noted Vincent [name] to by hypoxic at 82% on HHFNC 40L 100%.  HR 114 with frequent multifocal PVCs.  BP 119/89 (99) with no significant trend.  Respirations regular at 24 BPM, pulling good volumes. Does not endorse any SOB, pain, N/V, dizziness or lightheadedness.  Attempts to prone/turn to side were not successful in increasing SpO2.  Dr. Andria Frames updated via telephone of patient's assessment.  Discussed possibly trialing BiPAP, however patient is in no distress and it was decided to let Vincent [name] rest on current O2 settings of [place] and NRB.  Vincent [name] was resting comfortably in bed when rapid response concluded in no distress with no further acute change in vital signs.      Initial Focused Assessment: hypoxic at 82% on HHFNC 40L 100% ST frequent multifocal PVCs Respirations regular at 24 BPM No ABD distension or tenderness No SOB, pain, N/V, diziness, lightheadedness Neuro assesment WDL, oriented x4 and mentating well   Interventions:  HFNC changed to [place] and uptitrated to 40 L 100% FiO2. NRB remains on.  Attempts to prone unsuccessful.   Plan of Care (if not transferred):  Will monitor for discomfort and reassess need for BiPAP and/or comfort measures if Vincent [name] experiences any respiratory distress.    Event Summary:   at  0145 Rapid response called after SpO2 sustained in the 60s after changing from HFNC to [place] 40 L 100% + NRB    at  0148 Rapid response initial assessment, SpO2 back up into low 80s.  No respiratory distress.  [id number] Dr. Andria Frames updated on telephone, no change to plan of care.  0155 Rapid response concluded, Vincent [name] resting comfortably in bed in no acute distress SpO2 89% RR 24 HR 103 BP 113/55 (73)         Aundria Rud

## 2019-08-23 NOTE — Progress Notes (Signed)
Placed patient on 100% heated high flow and 40LPM along with 100% NRB with Sp02=67-75%

## 2019-08-23 NOTE — Progress Notes (Signed)
Patient's family member updated on discussion between patient and doctor during rounds.  Plan and situation were discussed with family, all questions answered.

## 2019-08-23 NOTE — Progress Notes (Signed)
PROGRESS NOTE  Vincent [name]  [id number] DOB: 02/10/1951 DOA: 08/06/2019 PCP: Leonard Downing, MD  Outpatient Specialists: Dr. Burr Medico, oncology Brief Narrative: Vincent [name] is a 69 y.o. male with a history of T2DM, metastatic pancreatic CA on chemotherapy, and HTN who presented 12/30 with hypoxia due to covid-19 pneumonia. Since admission, a full course of remdesivir and steroids were given with limited improvement, persistent, now severe, hypoxemia. Steroids have been restarted, CTA chest has shown confluent pneumonia and no pulmonary embolism. Broad spectrum antibiotics have been added empirically, though the patient's respiratory status has only deteriorated.  Assessment & Plan: Principal Problem:   Pneumonia due to COVID-19 virus Active Problems:   Pancreatic cancer ([place])   Acute respiratory failure with hypoxia (HCC)   Type 2 diabetes mellitus (HCC)   HLD (hyperlipidemia)   Goals of care DO NOT RESUSCITATE No bipap -Lengthy discussion daily at bedside with patient and again with wife over the phone, more than 30 minutes was spent discussing patient's prognosis, goals of care and current condition -Continue DNR per discussion that a ventilator or CPR would not change patient's ultimate outcome and would likely only increase patient's suffering and decreased quality of life which is their main goal -At this time family and patient are considering palliative care medicine consult, given patient has reached maximal medical therapy with no ability to advance care at this point given we are not considering invasive or noninvasive positive pressure ventilation and we can only continue to offer supportive care as well as heated high flow -Family to have goals of care discussion amongst the patient his wife their 2 sons as well as the patient's sister, unclear if this has happened yet. -Family able to visit on 08/21/19 as patient was deteriorating and we were concerned  for demise - somewhat more stable over the past 24h -Given patient's diagnosis of metastatic pancreatic cancer on palliative treatment with Dr. Morey Hummingbird certainly palliative care and comfort measures would be reasonable transition for the patient.   Acute hypoxemic respiratory failure due to covid-19 pneumonia, possible superimposed bacterial pneumonia: SARS-CoV-2 Ag+ 12/30. Protracted hospitalization in immunocompromised patient. CTA chest 1/9 without evidence of PE. -Overnight patient had episode of acute hypoxia requiring BiPAP and transition to ICU -Likely discussion as above BiPAP nor intubation are reasonable supportive devices given the patient's history -Patient now on heated high flow, 40 L at 100% with NRB overlying -Lengthy discussion as above given patient's worsening condition and completed maximum medical therapy there is very little left to offer the patient/familiy other than supportive care/oxygen -Discussed comfort measures and palliative care as above, awaiting family's decision. SpO2: 91 % O2 Flow Rate (L/min): 40 L/min FiO2 (%): 100 %  Recent Labs    08/20/19 0855 08/21/19 0027 08/21/19 0028 08/22/19 0450 08/23/19 0219  DDIMER  --  3.00*  --  3.08* 3.56*  CRP   < >  --  4.7* 3.3* 3.2*   < > = values in this interval not displayed.  - s/p remdesivir x5 days stop 08/11/19 - Steroids ongoing since 12/30. 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 Most recently received promacta  on 1/4 for marrow stimulation, though this was stopped due to thrombosis risk.  -  Patient's oncologist is aware of admission. Continue to delay chemotherapy at this time.  T2DM:  - Well-controlled chronically, HbA1c 6.6%.  - Increase sliding scale insulin to resistant during acute illness and ongoing use of steroids. -Add Lantus 20u HS  Hyperlipidemia:  - Continue statin. LFTs not severely elevated.  Anemia of malignancy:  - Stable, no evidence of blood loss.  - Continue monitoring.   DVT prophylaxis: Lovenox 0.5mg /kg q12h Code Status: Patient transition to back to DNR after lengthy discussion with patient and wife on 08/21/2019  Family Communication: Wife by phone - over 30 minutes spent discussing goals of care, current illness, and prognosis Disposition Plan: Patient's prognosis is quite grim at this time, given worsening respiratory status without ability to escalate care.  Lengthy discussion with patient and wife about prognosis, worsening condition, given chronic comorbid conditions patient remains high risk at baseline while on palliative chemotherapy.  Continue to discuss possible comfort measures and palliative care as patient's current condition continues to worsen.  Consultants:   Palliative care medicine team  Procedures:   None  Antimicrobials:  Remdesivir  Cefepime, vancomycin   Subjective: No acute events overnight, clinically feels quite well but still somewhat fatigued. Continues to tolerate NRB and Heated high flow well.  Objective: Vitals:   08/23/19 0145 08/23/19 0300 08/23/19 0400 08/23/19 0752  BP:   100/71 104/82  Pulse: (!) 31 (!) 115 (!) 110 83  Resp: (!) 34 (!) 37 (!) 26 (!) 29  Temp:   97.8 F (36.6 C) 98.3 F (36.8 C)  TempSrc:   Oral Axillary  SpO2: (!) 84% 92% 95% 91%  Weight:      Height:        Intake/Output Summary (Last 24 hours) at 08/23/2019 0841 Last data filed at 08/23/2019 0800 Gross per 24 hour  Intake 925 ml  Output 1025 ml  Net -100 ml   Filed Weights   08/06/19 2031 08/21/19 0200  Weight: 105.2 kg  91.6 kg    Gen: Acutely and chronically ill-appearing male in no acute distress Pulm: Tachypneic, mildly labored rating nonrebreather  CV: Regular tachycardia. No murmur, rub, or gallop. No JVD, no pedal edema. GI: Abdomen soft, non-tender, non-distended, with normoactive bowel sounds. No organomegaly or masses felt. Ext: Warm, decreased muscle bulk Skin: No rashes, lesions or ulcers Neuro: Alert and oriented. No focal neurological deficits. Psych: Judgement and insight appear normal. Mood & affect appropriate.   Data Reviewed: I have personally reviewed following labs and imaging studies  CBC: Recent Labs  Lab 08/19/19 0600 08/19/19 0600 08/20/19 0855 08/20/19 0855 08/20/19 2301 08/21/19 0027 08/21/19 0236 08/22/19 0450 08/23/19 0219  WBC 16.1*  --  26.2*  --   --  22.9*  --  18.5* 20.3*  NEUTROABS 14.8*  --  24.6*  --   --  21.4*  --  17.1* 18.7*  HGB 12.4*   < > 12.7*   < > 13.6 12.8* 14.3 12.1* 13.4  HCT 38.8*   < > 39.0   < > 40.0 38.8* 42.0 38.1* 41.1  MCV 102.4*  --  101.8*  --   --  100.3*  --  100.3* 99.8  PLT 206  --  276  --   --  256  --  226 292   < > = values in this interval not displayed.   Basic Metabolic Panel: Recent Labs  Lab 08/18/19  0600 08/18/19 0600 08/19/19 0600 08/20/19 0855 08/20/19 2301 08/21/19 0027 08/21/19 0031 08/21/19 0236 08/22/19 0450 08/23/19 0219  NA 138   < > 140 140 139 140  --  139  --   --   K 4.5   < > 4.4 4.4 4.3 4.6  --  4.8  --   --   CL 100  --  104 105  --  106  --   --   --   --   CO2 24  --  26 25  --  22  --   --   --   --   GLUCOSE 135*  --  163* 153*  --  184*  --   --   --   --   BUN 18  --  25* 30*  --  31*  --   --   --   --   CREATININE 0.81  --  0.69 0.79  --  0.72  --   --   --   --   CALCIUM 8.1*  --  8.7* 8.9  --  8.7*  --   --   --   --   MG 2.0   < > 2.5* 2.4  --   --  2.3  --  2.4 2.6*  PHOS 3.0   < > 3.9 3.9  --   --  3.5  --  3.2 3.2   < > = values in this interval not displayed.    GFR: Estimated Creatinine Clearance: 111.5 mL/min (by C-G formula based on SCr of 0.72 mg/dL). Liver Function Tests: Recent Labs  Lab 08/20/19 0855 08/21/19 0027  AST 32 42*  ALT 91* 108*  ALKPHOS 81 82  BILITOT 0.9 1.1  PROT 7.3 7.0  ALBUMIN 2.8* 2.6*   CBG: Recent Labs  Lab 08/22/19 1627 08/22/19 1926 08/22/19 2353 08/23/19 0158 08/23/19 0805  GLUCAP 309* 232* 89 91 182*   Urine analysis:    Component Value Date/Time   COLORURINE YELLOW 08/04/2019 [place] 08/04/2019 1119   LABSPEC 1.021 08/04/2019 1119   PHURINE 5.0 08/04/2019 1119   GLUCOSEU NEGATIVE 08/04/2019 1119   HGBUR NEGATIVE 08/04/2019 1119   BILIRUBINUR NEGATIVE 08/04/2019 1119   KETONESUR 5 (A) 08/04/2019 1119   PROTEINUR 30 (A) 08/04/2019 1119   NITRITE NEGATIVE 08/04/2019 1119   LEUKOCYTESUR NEGATIVE 08/04/2019 1119   Recent Results (from the past 240 hour(s))  Culture, blood (routine x 2)     Status: None   Collection Time: 08/13/19  1:47 PM   Specimen: BLOOD  Result Value Ref Range Status   Specimen Description   Final    BLOOD RIGHT ARM Performed at [facility] [address], [place], [place] [address]    Special Requests   Final    BOTTLES DRAWN AEROBIC ONLY Blood Culture adequate volume Performed at [facility] [address] [place], [place] [address]    Culture   Final    NO GROWTH 5 DAYS Performed at [facility], [place] [address], [place],  [address]    Report Status 08/18/2019 FINAL  Final  Culture, blood (routine x 2)     Status: None   Collection Time: 08/13/19  2:00 PM   Specimen: BLOOD  Result Value Ref Range Status   Specimen Description   Final    BLOOD RIGHT ARM Performed at [facility] [address] [place],  [place] [address]    Special Requests   Final    BOTTLES DRAWN AEROBIC ONLY Blood Culture adequate volume Performed at [facility]  [address], [place], [place] [address]    Culture   Final    NO GROWTH 5 DAYS Performed at [facility], [place] [address], [place], [place] [address]    Report Status 08/18/2019 FINAL  Final  MRSA PCR Screening     Status: None   Collection Time: 08/22/19  4:50 AM   Specimen: Nasal Mucosa; Nasopharyngeal  Result Value Ref Range Status   MRSA by PCR NEGATIVE NEGATIVE Final    Comment:        The GeneXpert MRSA Assay (FDA approved for NASAL specimens only), is one component of a comprehensive MRSA colonization surveillance program. It is not intended to diagnose MRSA infection nor to guide or monitor treatment for MRSA infections. Performed at [facility], [place] [address] [place], [place] [address]       Radiology Studies: VAS [place] LOWER EXTREMITY VENOUS (DVT)  Result Date: 08/22/2019  Lower Venous Study Indications: Elevated d-dimer. COVID positive.  Comparison Study: No prior study. Performing Technologist: Maudry Mayhew MHA, RDMS, RVT, RDCS  Examination Guidelines: A complete evaluation includes B-mode imaging, spectral Doppler, color Doppler, and power Doppler as needed of all accessible portions of each vessel. Bilateral testing is considered an integral part of a complete examination. Limited examinations for reoccurring indications may be performed as noted.  +---------+---------------+---------+-----------+----------+--------------+ RIGHT    CompressibilityPhasicitySpontaneityPropertiesThrombus Aging +---------+---------------+---------+-----------+----------+--------------+ CFV      Full           Yes      Yes                                 +---------+---------------+---------+-----------+----------+--------------+ SFJ      Full                                                        +---------+---------------+---------+-----------+----------+--------------+ FV Prox  Full                                                         +---------+---------------+---------+-----------+----------+--------------+ FV Mid   Full                                                        +---------+---------------+---------+-----------+----------+--------------+ FV DistalFull                                                        +---------+---------------+---------+-----------+----------+--------------+ PFV      Full                                                        +---------+---------------+---------+-----------+----------+--------------+  POP      Full           Yes      Yes                                 +---------+---------------+---------+-----------+----------+--------------+ PTV      Full                                                        +---------+---------------+---------+-----------+----------+--------------+ PERO     Full                                                        +---------+---------------+---------+-----------+----------+--------------+   +---------+---------------+---------+-----------+----------+--------------+ LEFT     CompressibilityPhasicitySpontaneityPropertiesThrombus Aging +---------+---------------+---------+-----------+----------+--------------+ CFV      Full           Yes      Yes                                 +---------+---------------+---------+-----------+----------+--------------+ SFJ      Full                                                        +---------+---------------+---------+-----------+----------+--------------+ FV Prox  Full                                                        +---------+---------------+---------+-----------+----------+--------------+ FV Mid   Full                                                        +---------+---------------+---------+-----------+----------+--------------+ FV DistalFull                                                         +---------+---------------+---------+-----------+----------+--------------+ PFV      Full                                                        +---------+---------------+---------+-----------+----------+--------------+ POP      Full           Yes      Yes                                 +---------+---------------+---------+-----------+----------+--------------+  PTV      Full                                                        +---------+---------------+---------+-----------+----------+--------------+ PERO     Full                                                        +---------+---------------+---------+-----------+----------+--------------+     Summary: Right: There is no evidence of deep vein thrombosis in the lower extremity. No cystic structure found in the popliteal fossa. Left: There is no evidence of deep vein thrombosis in the lower extremity. No cystic structure found in the popliteal fossa.  *See table(s) above for measurements and observations. Electronically signed by Monica Martinez MD on 08/22/2019 at 3:35:04 PM.    Final     Scheduled Meds: . vitamin C  500 mg Oral BID  . aspirin EC  81 mg Oral QPC breakfast  . Chlorhexidine Gluconate Cloth  6 each Topical Daily  . cholecalciferol  2,000 Units Oral Daily  . enoxaparin (LOVENOX) injection  40 mg Subcutaneous Q12H  . feeding supplement (ENSURE ENLIVE)  237 mL Oral TID BM  . gabapentin  300 mg Oral QHS  . insulin aspart  0-20 Units Subcutaneous TID AC & HS  . insulin aspart  4 Units Subcutaneous TID WC  . insulin glargine  20 Units Subcutaneous QHS  . mouth rinse  15 mL Mouth Rinse BID  . methylPREDNISolone (SOLU-MEDROL) injection  40 mg Intravenous Q8H  . simvastatin  20 mg Oral QPM  . zinc sulfate  220 mg Oral Daily   Continuous Infusions:   LOS: 16 days   Time spent: 35 minutes.  Little Ishikawa, DO [facility] Pager: Epic chat 08/23/2019, 8:41 AM

## 2019-08-23 NOTE — Progress Notes (Signed)
Patient able to call and facetime his spouse.  Updates given and no questions at this time.

## 2019-08-24 LAB — CBC WITH DIFFERENTIAL/PLATELET
Abs Immature Granulocytes: 0.11 10*3/uL — ABNORMAL HIGH (ref 0.00–0.07)
Basophils Absolute: 0 10*3/uL (ref 0.0–0.1)
Basophils Relative: 0 %
Eosinophils Absolute: 0 10*3/uL (ref 0.0–0.5)
Eosinophils Relative: 0 %
HCT: 40.9 % (ref 39.0–52.0)
Hemoglobin: 13.1 g/dL (ref 13.0–17.0)
Immature Granulocytes: 1 %
Lymphocytes Relative: 3 %
Lymphs Abs: 0.7 10*3/uL (ref 0.7–4.0)
MCH: 32.2 pg (ref 26.0–34.0)
MCHC: 32 g/dL (ref 30.0–36.0)
MCV: 100.5 fL — ABNORMAL HIGH (ref 80.0–100.0)
Monocytes Absolute: 0.8 10*3/uL (ref 0.1–1.0)
Monocytes Relative: 4 %
Neutro Abs: 17.5 10*3/uL — ABNORMAL HIGH (ref 1.7–7.7)
Neutrophils Relative %: 92 %
Platelets: 227 10*3/uL (ref 150–400)
RBC: 4.07 MIL/uL — ABNORMAL LOW (ref 4.22–5.81)
RDW: 14.3 % (ref 11.5–15.5)
WBC: 19.2 10*3/uL — ABNORMAL HIGH (ref 4.0–10.5)
nRBC: 0 % (ref 0.0–0.2)

## 2019-08-24 LAB — GLUCOSE, CAPILLARY
Glucose-Capillary: 158 mg/dL — ABNORMAL HIGH (ref 70–99)
Glucose-Capillary: 197 mg/dL — ABNORMAL HIGH (ref 70–99)
Glucose-Capillary: 206 mg/dL — ABNORMAL HIGH (ref 70–99)
Glucose-Capillary: 269 mg/dL — ABNORMAL HIGH (ref 70–99)

## 2019-08-24 LAB — C-REACTIVE PROTEIN: CRP: 2.1 mg/dL — ABNORMAL HIGH (ref <1.0)

## 2019-08-24 LAB — MAGNESIUM: Magnesium: 2.3 mg/dL (ref 1.7–2.4)

## 2019-08-24 LAB — PHOSPHORUS: Phosphorus: 4 mg/dL (ref 2.5–4.6)

## 2019-08-24 LAB — D-DIMER, QUANTITATIVE: D-Dimer, Quant: 3.73 ug/mL-FEU — ABNORMAL HIGH (ref 0.00–0.50)

## 2019-08-24 NOTE — Progress Notes (Addendum)
PROGRESS NOTE  Vincent [name]  [id number] DOB: July 03, 1951 DOA: 08/06/2019 PCP: Leonard Downing, MD  Outpatient Specialists: Dr. Burr Medico, oncology Brief Narrative: Vincent [name] is a 69 y.o. male with a history of T2DM, metastatic pancreatic CA on chemotherapy, and HTN who presented 12/30 with hypoxia due to covid-19 pneumonia. Since admission, a full course of remdesivir and steroids were given with limited improvement, persistent, now severe, hypoxemia. Steroids have been restarted, CTA chest has shown confluent pneumonia and no pulmonary embolism. Broad spectrum antibiotics have been added empirically, though the patient's respiratory status has only deteriorated.  Assessment & Plan: Principal Problem:   Pneumonia due to COVID-19 virus Active Problems:   Pancreatic cancer ([place])   Acute respiratory failure with hypoxia (HCC)   Type 2 diabetes mellitus (HCC)   HLD (hyperlipidemia)   Goals of care DO NOT RESUSCITATE No bipap -Lengthy discussion daily at bedside with patient and again with wife over the phone, more than 30 minutes was spent discussing patient's prognosis, goals of care and current condition -Continue DNR per discussion that a ventilator or CPR would not change patient's ultimate outcome and would likely only increase patient's suffering and decreased quality of life which is their main goal -At this time family and patient are considering palliative care medicine consult, given patient has reached maximal medical therapy with no ability to advance care at this point given we are not considering invasive or noninvasive positive pressure ventilation and we can only continue to offer supportive care as well as heated high flow -Family to have goals of care discussion amongst the patient his wife their 2 sons as well as the patient's sister, unclear if this has happened yet. -Family able to visit on 08/21/19 as patient was deteriorating and we were concerned  for demise - somewhat more stable over the past 24h -Given patient's diagnosis of metastatic pancreatic cancer on palliative treatment with Dr. Morey Hummingbird certainly palliative care and comfort measures would be reasonable transition for the patient. -Patient off contact precautions as of 08/27/19 - consider transfer to main campus so family can visit patient if oxygen to improve enough to support transfer.   Acute hypoxemic respiratory failure due to covid-19 pneumonia, possible superimposed bacterial pneumonia: SARS-CoV-2 Ag+ 12/30. Protracted hospitalization in immunocompromised patient. CTA chest 1/9 without evidence of PE. -Overnight patient had episode of acute hypoxia requiring BiPAP and transition to ICU -Likely discussion as above BiPAP nor intubation are reasonable supportive devices given the patient's history -Patient now on heated high flow, 40 L at 100% with NRB overlying -Lengthy discussion as above given patient's worsening condition and completed maximum medical therapy there is very little left to offer the patient/familiy other than supportive care/oxygen -Discussed comfort measures and palliative care as above, awaiting family's decision. SpO2: 90 % O2 Flow Rate (L/min): 40 L/min FiO2 (%): 100 %  Recent Labs    08/22/19 0450 08/23/19 0219 08/24/19 0520  DDIMER 3.08* 3.56* 3.73*  CRP 3.3* 3.2* 2.1*  - s/p remdesivir x5 days stop 08/11/19 - Steroids ongoing since 12/30. 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 Most recently received promacta on 1/4  for marrow  stimulation, though this was stopped due to thrombosis risk.  - Patient's oncologist is aware of admission. Continue to delay chemotherapy at this time.  T2DM:  - Well-controlled chronically, HbA1c 6.6%.  - Increase sliding scale insulin to resistant during acute illness and ongoing use of steroids. -Add Lantus 20u HS  Hyperlipidemia:  - Continue statin. LFTs not severely elevated.  Anemia of malignancy:  - Stable, no evidence of blood loss.  - Continue monitoring.   DVT prophylaxis: Lovenox 0.5mg /kg q12h Code Status: Patient transition to back to DNR after lengthy discussion with patient and wife on 08/21/2019  Family Communication: Wife by phone - over 30 minutes spent discussing goals of care, current illness, and prognosis Disposition Plan: Patient's prognosis is quite grim at this time, given worsening respiratory status without ability to escalate care.  Lengthy discussion with patient and wife about prognosis, worsening condition, given chronic comorbid conditions patient remains high risk at baseline while on palliative chemotherapy.  Continue to discuss possible comfort measures and palliative care as patient's current condition continues to worsen.  Consultants:   Palliative care medicine team  Procedures:   None  Antimicrobials:  Remdesivir  Cefepime, vancomycin   Subjective: No acute events overnight, clinically feels quite well but still somewhat fatigued. Continues to tolerate NRB and Heated high flow well.  Objective: Vitals:   08/23/19 2330 08/24/19 0300 08/24/19 0407 08/24/19 0800  BP: (!) 135/101 (!) 118/96    Pulse: (!) 106 (!) 104 99 (!) 103  Resp: 19 (!) 24 (!) 23 (!) 24  Temp: 99.1 F (37.3 C) 98.7 F (37.1 C)    TempSrc: Axillary Axillary    SpO2:   94% 90%  Weight:      Height:        Intake/Output Summary (Last 24 hours) at 08/24/2019 0838 Last data filed at 08/24/2019 0500 Gross per 24 hour  Intake 660 ml  Output 825 ml  Net -165 ml    Filed Weights   08/06/19 2031 08/21/19 0200  Weight: 105.2 kg 91.6 kg    Gen: Acutely and chronically ill-appearing male in no acute distress Pulm: Tachypneic, mildly labored using heated high flow with overlying nonrebreather  CV: Regular tachycardia. No murmur, rub, or gallop. No JVD, no pedal edema. GI: Abdomen soft, non-tender, non-distended, with normoactive bowel sounds. No organomegaly or masses felt. Ext: Warm, decreased muscle bulk Skin: No rashes, lesions or ulcers Neuro: Alert and oriented. No focal neurological deficits.  Data Reviewed: I have personally reviewed following labs and imaging studies  CBC: Recent Labs  Lab 08/20/19 0855 08/20/19 2301 08/21/19 0027 08/21/19 0236 08/22/19 0450 08/23/19 0219 08/24/19 0520  WBC 26.2*  --  22.9*  --  18.5* 20.3* 19.2*  NEUTROABS 24.6*  --  21.4*  --  17.1* 18.7* 17.5*  HGB 12.7*   < > 12.8* 14.3 12.1* 13.4 13.1  HCT 39.0   < > 38.8* 42.0 38.1* 41.1 40.9  MCV 101.8*  --  100.3*  --  100.3* 99.8 100.5*  PLT 276  --  256  --  226 292 227   < > = values in this interval not displayed.   Basic Metabolic Panel: Recent Labs  Lab 08/18/19 0600 08/18/19 0600 08/19/19 0600 08/19/19 0600 08/20/19 0855 08/20/19 2301 08/21/19 0027 08/21/19 0031 08/21/19 0236 08/22/19 0450 08/23/19 0219 08/24/19 0520  NA 138   < > 140  --  140 139 140  --  139  --   --   --  K 4.5   < > 4.4  --  4.4 4.3 4.6  --  4.8  --   --   --   CL 100  --  104  --  105  --  106  --   --   --   --   --   CO2 24  --  26  --  25  --  22  --   --   --   --   --   GLUCOSE 135*  --  163*  --  153*  --  184*  --   --   --   --   --   BUN 18  --  25*  --  30*  --  31*  --   --   --   --   --   CREATININE 0.81  --  0.69  --  0.79  --  0.72  --   --   --   --   --   CALCIUM 8.1*  --  8.7*  --  8.9  --  8.7*  --   --   --   --   --   MG 2.0   < > 2.5*   < > 2.4  --   --  2.3  --  2.4 2.6* 2.3  PHOS 3.0   < > 3.9   < > 3.9  --   --  3.5  --  3.2 3.2 4.0    < > = values in this interval not displayed.   GFR: Estimated Creatinine Clearance: 111.5 mL/min (by C-G formula based on SCr of 0.72 mg/dL). Liver Function Tests: Recent Labs  Lab 08/20/19 0855 08/21/19 0027  AST 32 42*  ALT 91* 108*  ALKPHOS 81 82  BILITOT 0.9 1.1  PROT 7.3 7.0  ALBUMIN 2.8* 2.6*   CBG: Recent Labs  Lab 08/23/19 0805 08/23/19 1142 08/23/19 1608 08/23/19 2022 08/24/19 0004  GLUCAP 182* 180* 208* 135* 206*   Urine analysis:    Component Value Date/Time   COLORURINE YELLOW 08/04/2019 [place] 08/04/2019 1119   LABSPEC 1.021 08/04/2019 1119   PHURINE 5.0 08/04/2019 1119   GLUCOSEU NEGATIVE 08/04/2019 1119   HGBUR NEGATIVE 08/04/2019 1119   BILIRUBINUR NEGATIVE 08/04/2019 1119   KETONESUR 5 (A) 08/04/2019 1119   PROTEINUR 30 (A) 08/04/2019 1119   NITRITE NEGATIVE 08/04/2019 1119   LEUKOCYTESUR NEGATIVE 08/04/2019 1119   Recent Results (from the past 240 hour(s))  MRSA PCR Screening     Status: None   Collection Time: 08/22/19  4:50 AM   Specimen: Nasal Mucosa; Nasopharyngeal  Result Value Ref Range Status   MRSA by PCR NEGATIVE NEGATIVE Final    Comment:        The GeneXpert MRSA Assay (FDA approved for NASAL specimens only), is one component of a comprehensive MRSA colonization surveillance program. It is not intended to diagnose MRSA infection nor to guide or monitor treatment for MRSA infections. Performed at [facility], [place] [address], [place], [place] [address]       Radiology Studies: No results found.  Scheduled Meds: . vitamin C  500 mg Oral BID  . aspirin EC  81 mg Oral QPC breakfast  . Chlorhexidine Gluconate Cloth  6 each Topical Daily  . cholecalciferol  2,000 Units Oral Daily  . enoxaparin (LOVENOX) injection  40 mg Subcutaneous Q24H  . feeding supplement (ENSURE ENLIVE)  237  mL Oral TID BM  . gabapentin  300 mg Oral QHS  . insulin aspart  0-20 Units Subcutaneous TID AC & HS  .  insulin aspart  4 Units Subcutaneous TID WC  . insulin glargine  20 Units Subcutaneous QHS  . mouth rinse  15 mL Mouth Rinse BID  . methylPREDNISolone (SOLU-MEDROL) injection  40 mg Intravenous Q8H  . simvastatin  20 mg Oral QPM  . zinc sulfate  220 mg Oral Daily   Continuous Infusions:   LOS: 17 days   Time spent: 35 minutes.  Little Ishikawa, DO [facility] Pager: Epic chat 08/24/2019, 8:38 AM

## 2019-08-25 ENCOUNTER — Other Ambulatory Visit: Payer: Self-pay | Admitting: Hematology

## 2019-08-25 LAB — CBC WITH DIFFERENTIAL/PLATELET
Abs Immature Granulocytes: 0.08 10*3/uL — ABNORMAL HIGH (ref 0.00–0.07)
Basophils Absolute: 0 10*3/uL (ref 0.0–0.1)
Basophils Relative: 0 %
Eosinophils Absolute: 0.3 10*3/uL (ref 0.0–0.5)
Eosinophils Relative: 2 %
HCT: 40 % (ref 39.0–52.0)
Hemoglobin: 12.7 g/dL — ABNORMAL LOW (ref 13.0–17.0)
Immature Granulocytes: 1 %
Lymphocytes Relative: 5 %
Lymphs Abs: 0.8 10*3/uL (ref 0.7–4.0)
MCH: 32.4 pg (ref 26.0–34.0)
MCHC: 31.8 g/dL (ref 30.0–36.0)
MCV: 102 fL — ABNORMAL HIGH (ref 80.0–100.0)
Monocytes Absolute: 0.9 10*3/uL (ref 0.1–1.0)
Monocytes Relative: 5 %
Neutro Abs: 15.4 10*3/uL — ABNORMAL HIGH (ref 1.7–7.7)
Neutrophils Relative %: 87 %
Platelets: 205 10*3/uL (ref 150–400)
RBC: 3.92 MIL/uL — ABNORMAL LOW (ref 4.22–5.81)
RDW: 14.4 % (ref 11.5–15.5)
WBC: 17.5 10*3/uL — ABNORMAL HIGH (ref 4.0–10.5)
nRBC: 0 % (ref 0.0–0.2)

## 2019-08-25 LAB — GLUCOSE, CAPILLARY
Glucose-Capillary: 128 mg/dL — ABNORMAL HIGH (ref 70–99)
Glucose-Capillary: 179 mg/dL — ABNORMAL HIGH (ref 70–99)
Glucose-Capillary: 91 mg/dL (ref 70–99)
Glucose-Capillary: 97 mg/dL (ref 70–99)

## 2019-08-25 LAB — D-DIMER, QUANTITATIVE: D-Dimer, Quant: 3.43 ug/mL-FEU — ABNORMAL HIGH (ref 0.00–0.50)

## 2019-08-25 LAB — C-REACTIVE PROTEIN: CRP: 1.1 mg/dL — ABNORMAL HIGH (ref <1.0)

## 2019-08-25 LAB — PHOSPHORUS: Phosphorus: 3.6 mg/dL (ref 2.5–4.6)

## 2019-08-25 LAB — MAGNESIUM: Magnesium: 2.3 mg/dL (ref 1.7–2.4)

## 2019-08-25 MED ORDER — METHYLPREDNISOLONE SODIUM SUCC 40 MG IJ SOLR
20.0000 mg | Freq: Two times a day (BID) | INTRAMUSCULAR | Status: DC
  Administered 2019-08-25 – 2019-08-30 (×10): 20 mg via INTRAVENOUS
  Filled 2019-08-25 (×10): qty 1

## 2019-08-25 NOTE — Progress Notes (Addendum)
PROGRESS NOTE  Vincent [name]  [id number] DOB: [birth year] DOA: 08/06/2019 PCP: Leonard Downing, MD  Outpatient Specialists: Dr. Burr Medico, oncology Brief Narrative: Vincent [name] is a 69 y.o. male with a history of T2DM, metastatic pancreatic CA on chemotherapy, and HTN who presented 12/30 with hypoxia due to covid-19 pneumonia. Since admission, a full course of remdesivir and steroids were given with limited improvement, persistent, now severe, hypoxemia. Steroids have been restarted, CTA chest has shown confluent pneumonia and no pulmonary embolism. Broad spectrum antibiotics have been added empirically, though the patient's respiratory status has only deteriorated.  Assessment & Plan: Principal Problem:   Pneumonia due to COVID-19 virus Active Problems:   Pancreatic cancer ([place])   Acute respiratory failure with hypoxia (HCC)   Type 2 diabetes mellitus (HCC)   HLD (hyperlipidemia)   Goals of care DO NOT RESUSCITATE No bipap - Lengthy discussion daily at bedside with patient and again with wife over the phone, more than 30 minutes was spent discussing patient's prognosis, goals of care and current condition - Continue DNR per discussion that a ventilator or CPR would not change patient's ultimate outcome and would likely only increase patient's suffering and decreased quality of life which is their main goal - At this time family and patient are considering palliative care medicine consult, given patient has reached maximal medical therapy with no ability to advance care at this point given we are not considering invasive or noninvasive positive pressure ventilation and we can only continue to offer supportive care as well as heated high flow - Family to have goals of care discussion amongst the patient his wife their 2 sons as well as the patient's sister, unclear if this has happened yet. - Family able to visit on 08/21/19 as patient was deteriorating and we were  concerned for demise - somewhat more stable over the past 24h - Given patient's diagnosis of metastatic pancreatic cancer on palliative treatment with Dr. Morey Hummingbird certainly palliative care and comfort measures would be reasonable transition for the patient. - Patient off contact precautions as of 08/27/19 - consider transfer to main campus so family can visit patient if oxygen to improve enough to support transfer.   Acute hypoxemic respiratory failure due to covid-19 pneumonia, possible superimposed bacterial pneumonia: SARS-CoV-2 Ag+ 12/30. Protracted hospitalization in immunocompromised patient. CTA chest 1/9 without evidence of PE. - Overnight patient had episode of acute hypoxia requiring BiPAP and transition to ICU - Likely discussion as above BiPAP nor intubation are reasonable supportive devices given the patient's history - Patient continues on heated high flow - 40L at 100% with NRB overlying - Attempt to keep sats 88-90% to wean oxygen - VERY poor exertional status - markedly hypoxic with minimal movement and prolonged recovery time. - Lengthy discussion as above given patient's worsening condition and completed maximum medical therapy there is very little left to offer the patient/familiy other than supportive care/oxygen - Discussed comfort measures and palliative care as above, family continues to await for recovery and continues to hope for the best. SpO2: 95 % O2 Flow Rate (L/min): 30 L/min(Titrated by MD. No increased WOB at this time.) FiO2 (%): 90 %(Titrated to 90%, sats 95%.)  Recent Labs    08/23/19 0219 08/24/19 0520 08/25/19 0615 08/25/19 0620  DDIMER 3.56* 3.73* 3.43*  --   CRP 3.2* 2.1*  --  1.1*  - s/p remdesivir x5 days stop 08/11/19 - Steroids ongoing since 12/30. CRP previously rising for several days around clinical worsening -  methylpred increased to 40q8h --> 20q12h now - continue prolonged wean given tenuous but improving status. 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 Most recently received promacta on 1/4 for marrow stimulation, though this was stopped due to thrombosis risk.  - Patient's oncologist is aware of admission. Continue to delay chemotherapy at this time.  T2DM:  - Well-controlled chronically, HbA1c 6.6%.  - Increase sliding scale insulin to resistant during acute illness and ongoing use of steroids. - Continue Lantus 20u HS  Hyperlipidemia:  - Continue statin. LFTs improving, minimally elevated.  Anemia of malignancy:  - Stable, no evidence of blood loss.  - Continue monitoring.   DVT prophylaxis: Lovenox 0.5mg /kg q12h Code Status: Patient transition to back to DNR after lengthy discussion with patient and wife on 08/21/2019  Family Communication: Wife by phone - over 30 minutes spent discussing goals of care, current illness, and prognosis Disposition Plan: Patient's prognosis is quite grim at this time, given worsening respiratory status without ability to escalate care.  Lengthy discussion with patient and wife about prognosis, worsening condition, given chronic comorbid conditions patient remains high risk at baseline while on palliative chemotherapy.  Continue to discuss possible comfort measures and palliative care as patient's current condition continues to worsen.  Consultants:   Palliative care medicine team  Procedures:   None  Antimicrobials:  Remdesivir  Cefepime, vancomycin   Subjective: No acute events overnight, clinically feels quite well but still somewhat fatigued.  Remains in good spirits, clinically appears improved today denies chest pain, nausea, vomiting, diarrhea, constipation, headache, fevers, chills.  Looking forward to attempting to ambulate today and sit up to chair  again as he tolerated quite well yesterday.  Remains somewhat dyspneic with exertion but at rest feels quite well on high flow nasal cannula alone, no longer on nonrebreather.  Objective: Vitals:   08/25/19 0800 08/25/19 0839 08/25/19 1200 08/25/19 1241  BP: 94/73   96/84  Pulse: 83 98  95  Resp: 17 14  (!) 26  Temp: 98.5 F (36.9 C)   98.4 F (36.9 C)  TempSrc: Axillary   Axillary  SpO2: 92% (!) 87% 97% 95%  Weight:      Height:        Intake/Output Summary (Last 24 hours) at 08/25/2019 1543 Last data filed at 08/25/2019 1300 Gross per 24 hour  Intake --  Output 1350 ml  Net -1350 ml   Filed Weights   08/06/19 2031 08/21/19 0200  Weight: 105.2 kg 91.6 kg    Gen: Acutely and chronically ill-appearing male in no acute distress Pulm: Tachypneic, mildly labored using heated high flow with overlying nonrebreather  CV: Regular tachycardia. No murmur, rub, or gallop. No JVD, no pedal edema. GI: Abdomen soft, non-tender, non-distended, with normoactive bowel sounds. No organomegaly or masses felt. Ext: Warm, decreased muscle bulk Skin: No rashes, lesions or ulcers Neuro: Alert and oriented. No focal neurological deficits.  Data Reviewed: I have personally reviewed following labs and imaging studies  CBC: Recent Labs  Lab 08/21/19 0027 08/21/19 0027 08/21/19 0236 08/22/19 0450 08/23/19 0219 08/24/19 0520 08/25/19 0615  WBC 22.9*  --   --  18.5* 20.3* 19.2* 17.5*  NEUTROABS 21.4*  --   --  17.1* 18.7* 17.5* 15.4*  HGB 12.8*   < > 14.3 12.1* 13.4 13.1 12.7*  HCT 38.8*   < > 42.0 38.1* 41.1 40.9 40.0  MCV 100.3*  --   --  100.3* 99.8 100.5* 102.0*  PLT 256  --   --  226 292 227 205   < > = values in this interval not displayed.   Basic Metabolic Panel: Recent Labs  Lab 08/19/19 0600 08/19/19 0600 08/20/19 0855 08/20/19 0855 08/20/19 2301 08/21/19 0027 08/21/19 0031 08/21/19 0236 08/22/19 0450 08/23/19 0219 08/24/19 0520 08/25/19 0615  NA 140  --  140  --   139 140  --  139  --   --   --   --   K 4.4  --  4.4  --  4.3 4.6  --  4.8  --   --   --   --   CL 104  --  105  --   --  106  --   --   --   --   --   --   CO2 26  --  25  --   --  22  --   --   --   --   --   --   GLUCOSE 163*  --  153*  --   --  184*  --   --   --   --   --   --   BUN 25*  --  30*  --   --  31*  --   --   --   --   --   --   CREATININE 0.69  --  0.79  --   --  0.72  --   --   --   --   --   --   CALCIUM 8.7*  --  8.9  --   --  8.7*  --   --   --   --   --   --   MG 2.5*   < > 2.4   < >  --   --  2.3  --  2.4 2.6* 2.3 2.3  PHOS 3.9   < > 3.9   < >  --   --  3.5  --  3.2 3.2 4.0 3.6   < > = values in this interval not displayed.   GFR: Estimated Creatinine Clearance: 111.5 mL/min (by C-G formula based on SCr of 0.72 mg/dL). Liver Function Tests: Recent Labs  Lab 08/20/19 0855 08/21/19 0027  AST 32 42*  ALT 91* 108*  ALKPHOS 81 82  BILITOT 0.9 1.1  PROT 7.3 7.0  ALBUMIN 2.8* 2.6*   CBG: Recent Labs  Lab 08/24/19 1149 08/24/19 1630 08/24/19 2100 08/25/19 0845 08/25/19 1234  GLUCAP 269* 158* 197* 128* 179*   Urine analysis:    Component Value Date/Time   COLORURINE YELLOW 08/04/2019 [place] 08/04/2019 1119   LABSPEC 1.021 08/04/2019 1119   PHURINE 5.0 08/04/2019 1119   GLUCOSEU NEGATIVE 08/04/2019 1119   HGBUR NEGATIVE 08/04/2019 1119   BILIRUBINUR NEGATIVE 08/04/2019 1119   KETONESUR 5 (A) 08/04/2019 1119   PROTEINUR 30 (A) 08/04/2019 1119   NITRITE NEGATIVE 08/04/2019 1119   LEUKOCYTESUR NEGATIVE 08/04/2019 1119   Recent Results (from the past 240 hour(s))  MRSA PCR Screening     Status: None   Collection Time: 08/22/19  4:50 AM   Specimen: Nasal Mucosa; Nasopharyngeal  Result Value Ref Range Status   MRSA by PCR NEGATIVE NEGATIVE Final    Comment:  The GeneXpert MRSA Assay (FDA approved for NASAL specimens only), is one component of a comprehensive MRSA colonization surveillance program. It is not intended to  diagnose MRSA infection nor to guide or monitor treatment for MRSA infections. Performed at [facility], [place] [address], [place], [place] [address]       Radiology Studies: No results found.  Scheduled Meds: . vitamin C  500 mg Oral BID  . aspirin EC  81 mg Oral QPC breakfast  . Chlorhexidine Gluconate Cloth  6 each Topical Daily  . cholecalciferol  2,000 Units Oral Daily  . enoxaparin (LOVENOX) injection  40 mg Subcutaneous Q24H  . feeding supplement (ENSURE ENLIVE)  237 mL Oral TID BM  . gabapentin  300 mg Oral QHS  . insulin aspart  0-20 Units Subcutaneous TID AC & HS  . insulin aspart  4 Units Subcutaneous TID WC  . insulin glargine  20 Units Subcutaneous QHS  . mouth rinse  15 mL Mouth Rinse BID  . methylPREDNISolone (SOLU-MEDROL) injection  20 mg Intravenous Q12H  . simvastatin  20 mg Oral QPM  . zinc sulfate  220 mg Oral Daily   Continuous Infusions:   LOS: 18 days   Time spent: 35 minutes.  Little Ishikawa, DO [facility] Pager: Epic chat 08/25/2019, 3:43 PM

## 2019-08-25 NOTE — Progress Notes (Signed)
Pt wife, [name] updated!

## 2019-08-26 LAB — CBC WITH DIFFERENTIAL/PLATELET
Abs Immature Granulocytes: 0.08 10*3/uL — ABNORMAL HIGH (ref 0.00–0.07)
Basophils Absolute: 0 10*3/uL (ref 0.0–0.1)
Basophils Relative: 0 %
Eosinophils Absolute: 0.5 10*3/uL (ref 0.0–0.5)
Eosinophils Relative: 3 %
HCT: 40.9 % (ref 39.0–52.0)
Hemoglobin: 12.9 g/dL — ABNORMAL LOW (ref 13.0–17.0)
Immature Granulocytes: 1 %
Lymphocytes Relative: 5 %
Lymphs Abs: 0.8 10*3/uL (ref 0.7–4.0)
MCH: 32 pg (ref 26.0–34.0)
MCHC: 31.5 g/dL (ref 30.0–36.0)
MCV: 101.5 fL — ABNORMAL HIGH (ref 80.0–100.0)
Monocytes Absolute: 0.8 10*3/uL (ref 0.1–1.0)
Monocytes Relative: 5 %
Neutro Abs: 14.6 10*3/uL — ABNORMAL HIGH (ref 1.7–7.7)
Neutrophils Relative %: 86 %
Platelets: 222 10*3/uL (ref 150–400)
RBC: 4.03 MIL/uL — ABNORMAL LOW (ref 4.22–5.81)
RDW: 14.5 % (ref 11.5–15.5)
WBC: 16.7 10*3/uL — ABNORMAL HIGH (ref 4.0–10.5)
nRBC: 0 % (ref 0.0–0.2)

## 2019-08-26 LAB — GLUCOSE, CAPILLARY
Glucose-Capillary: 183 mg/dL — ABNORMAL HIGH (ref 70–99)
Glucose-Capillary: 104 mg/dL — ABNORMAL HIGH (ref 70–99)
Glucose-Capillary: 153 mg/dL — ABNORMAL HIGH (ref 70–99)
Glucose-Capillary: 150 mg/dL — ABNORMAL HIGH (ref 70–99)
Glucose-Capillary: 189 mg/dL — ABNORMAL HIGH (ref 70–99)

## 2019-08-26 LAB — D-DIMER, QUANTITATIVE: D-Dimer, Quant: 2.93 ug/mL-FEU — ABNORMAL HIGH (ref 0.00–0.50)

## 2019-08-26 LAB — PHOSPHORUS: Phosphorus: 3.3 mg/dL (ref 2.5–4.6)

## 2019-08-26 LAB — C-REACTIVE PROTEIN: CRP: 0.9 mg/dL (ref <1.0)

## 2019-08-26 LAB — MAGNESIUM: Magnesium: 2.2 mg/dL (ref 1.7–2.4)

## 2019-08-26 MED ORDER — MELATONIN 3 MG PO TABS
3.0000 mg | ORAL_TABLET | Freq: Every evening | ORAL | Status: DC | PRN
  Administered 2019-08-26 – 2019-08-28 (×3): 3 mg via ORAL
  Filled 2019-08-26 (×4): qty 1

## 2019-08-26 NOTE — Progress Notes (Signed)
Attempted to call pt's wife, [name] at [phone] (number left with secretary to return call). No answer. Voicemail left stating I was returning her call for an update.

## 2019-08-26 NOTE — Progress Notes (Signed)
PHYSICAL THERAPY EVALUATION  CLINICAL IMPRESSION: Prior to initial hospitalization pt states was at home with family and was quite independnet. This am pt is quite fatigued and anxious about mobility. Initiated tx with sitting edge of bed and pt desat to 70s, became anxoius and requested return to supine. With laying semi reclined in bed able to completed pursed lip breathing and increase saturations to 88%, pt agreeable to attempting to get oob again. Pt needed min a for bed mob and also transfer from bed to recliner, once again pt desat to high 60s with mobility needing increased time to recover. Pt was initially on HHF at 25L with mobility pt was also placed on 15L NRB and nurse was in room to titrate 02 as needed. At end of session on HHF and NRB pt was at 91%    08/26/19 1200  PT Visit Information  Last PT Received On 08/26/19  Assistance Needed +1  History of Present Illness 69 y/o male with hx of T2DM, metastatic pancreatic CA on chemotherapy, and HTN who presented 12/30 with hypoxia due to covid-19 pneumonia. Since admission, a full course of remdesivir and steroids were given with limited improvement, persistent, now severe, hypoxemia. Steroids have been restarted, CTA chest has shown confluent pneumonia and no pulmonary embolism. Broad spectrum antibiotics have been added empirically, though the patient's respiratory status has only deteriorated.  Subjective Data  Patient Stated Goal did not verbalize goal  Precautions  Precautions Fall;Other (comment)  Precaution Comments 02 desat to 70s w/ minimal activity  Restrictions  Weight Bearing Restrictions No  Pain Assessment  Pain Assessment No/denies pain  Cognition  Arousal/Alertness Lethargic  Behavior During Therapy WFL for tasks assessed/performed  Overall Cognitive Status Within Functional Limits for tasks assessed  Bed Mobility  Overal bed mobility Needs Assistance  Bed Mobility Supine to Sit;Sit to Supine  Supine to sit Min  assist  Sit to supine Min assist  Transfers  Overall transfer level Needs assistance  Transfers Sit to/from Stand;Stand Pivot Transfers  Sit to Stand Min assist  Stand pivot transfers Min assist  Ambulation/Gait  General Gait Details did not attempt ambulation sec to desat to low 70s with minimal activity and taking substancially increased time to recover  Balance  Overall balance assessment Needs assistance  Sitting-balance support Feet supported  Sitting balance-Leahy Scale Fair  Sitting balance - Comments c/o dizziness sitting EOB  Standing balance support During functional activity  Standing balance-Leahy Scale Fair  PT - End of Session  Equipment Utilized During Treatment Oxygen  Activity Tolerance Treatment limited secondary to medical complications (Comment);Patient limited by fatigue;Patient limited by lethargy  Patient left in chair;with call bell/phone within reach  Nurse Communication Other (comment) (nurse in room most of session)   PT - Assessment/Plan  PT Visit Diagnosis Unsteadiness on feet (R26.81)  PT Frequency (ACUTE ONLY) Min 3X/week  Follow Up Recommendations Supervision/Assistance - 24 hour  PT equipment None recommended by PT  AM-PAC PT "6 Clicks" Mobility Outcome Measure (Version 2)  Help needed turning from your back to your side while in a flat bed without using bedrails? 4  Help needed moving from lying on your back to sitting on the side of a flat bed without using bedrails? 4  Help needed moving to and from a bed to a chair (including a wheelchair)? 3  Help needed standing up from a chair using your arms (e.g., wheelchair or bedside chair)? 3  Help needed to walk in hospital room? 2  Help  needed climbing 3-5 steps with a railing?  2  6 Click Score 18  Consider Recommendation of Discharge To: [facility]  Acute Rehab PT Goals  Time For Goal Achievement 09/09/19  Potential to Achieve Goals Fair  PT Time Calculation  PT Start Time (ACUTE ONLY) 1037   PT Stop Time (ACUTE ONLY) 1129  PT Time Calculation (min) (ACUTE ONLY) 52 min  PT General Charges  $$ ACUTE PT VISIT 1 Visit  PT Evaluation  $PT Eval Moderate Complexity 1 Mod  PT Treatments  $Therapeutic Activity 8-22 mins  $Self Care/Home Management 8-22    Horald Chestnut, PT

## 2019-08-26 NOTE — Evaluation (Signed)
Physical Therapy Evaluation Patient Details Name: Vincent [name] MRN: [id number] DOB: 11-Jul-1951 Today's Date: 08/26/2019   History of Present Illness  69 y/o male with hx of T2DM, metastatic pancreatic CA on chemotherapy, and HTN who presented 12/30 with hypoxia due to covid-19 pneumonia. Since admission, a full course of remdesivir and steroids were given with limited improvement, persistent, now severe, hypoxemia. Steroids have been restarted, CTA chest has shown confluent pneumonia and no pulmonary embolism. Broad spectrum antibiotics have been added empirically, though the patient's respiratory status has only deteriorated.  Clinical Impression       Follow Up Recommendations Supervision/Assistance - 24 hour    Equipment Recommendations  None recommended by PT    Recommendations for Other Services       Precautions / Restrictions Precautions Precautions: Fall;Other (comment) Precaution Comments: 02 desat to 70s w/ minimal activity Restrictions Weight Bearing Restrictions: No      Mobility  Bed Mobility Overal bed mobility: Needs Assistance Bed Mobility: Supine to Sit;Sit to Supine     Supine to sit: Min assist Sit to supine: Min assist      Transfers Overall transfer level: Needs assistance   Transfers: Sit to/from [employer] Transfers Sit to Stand: Min assist Stand pivot transfers: Min assist          Ambulation/Gait             General Gait Details: did not attempt ambulation sec to desat to low 70s with minimal activity and taking substancially increased time to recover  Stairs            Wheelchair Mobility    Modified Rankin (Stroke Patients Only)       Balance Overall balance assessment: Needs assistance Sitting-balance support: Feet supported Sitting balance-Leahy Scale: Fair Sitting balance - Comments: c/o dizziness sitting EOB   Standing balance support: During functional activity Standing balance-Leahy Scale:  Fair                               Pertinent Vitals/Pain      Home Living Family/patient expects to be discharged to:: Private residence Living Arrangements: Spouse/significant other Available Help at Discharge: Family                  Prior Function                 Hand Dominance        Extremity/Trunk Assessment   Upper Extremity Assessment Upper Extremity Assessment: Generalized weakness    Lower Extremity Assessment Lower Extremity Assessment: Generalized weakness       Communication      Cognition Arousal/Alertness: Lethargic Behavior During Therapy: WFL for tasks assessed/performed Overall Cognitive Status: Within Functional Limits for tasks assessed                                        General Comments      Exercises     Assessment/Plan    PT Assessment Patient needs continued PT services  PT Problem List Decreased strength;Decreased activity tolerance;Decreased balance;Decreased mobility;Decreased coordination;Decreased safety awareness       PT Treatment Interventions Gait training;Functional mobility training;Therapeutic activities;Therapeutic exercise;Balance training;Neuromuscular re-education;Patient/family education    PT Goals (Current goals can be found in the Care Plan section)  Acute Rehab PT Goals Patient Stated Goal: did not verbalize goal  Time For Goal Achievement: 09/09/19 Potential to Achieve Goals: Fair    Frequency Min 3X/week   Barriers to discharge        Co-evaluation               AM-PAC PT "6 Clicks" Mobility  Outcome Measure Help needed turning from your back to your side while in a flat bed without using bedrails?: None Help needed moving from lying on your back to sitting on the side of a flat bed without using bedrails?: None Help needed moving to and from a bed to a chair (including a wheelchair)?: A Little Help needed standing up from a chair using your arms  (e.g., wheelchair or bedside chair)?: A Little Help needed to walk in hospital room?: A Lot Help needed climbing 3-5 steps with a railing? : A Lot 6 Click Score: 18    End of Session Equipment Utilized During Treatment: Oxygen Activity Tolerance: Treatment limited secondary to medical complications (Comment);Patient limited by fatigue;Patient limited by lethargy Patient left: in chair;with call bell/phone within reach Nurse Communication: Other (comment)(nurse in room most of session) PT Visit Diagnosis: Unsteadiness on feet (R26.81)    Time: [id number] PT Time Calculation (min) (ACUTE ONLY): 52 min   Charges:   PT Evaluation $PT Eval Moderate Complexity: 1 Mod PT Treatments $Therapeutic Activity: 8-22 mins $Self Care/Home Management: 8-22        Horald Chestnut, PT   Delford Field 08/26/2019, 12:45 PM

## 2019-08-26 NOTE — Plan of Care (Signed)

## 2019-08-26 NOTE — Progress Notes (Signed)
PROGRESS NOTE  Labron Kilian  [id number] DOB: 04-08-1951 DOA: 08/06/2019 PCP: Leonard Downing, MD  Outpatient Specialists: Dr. Burr Medico, oncology Brief Narrative: Maxamillion Gessert is a 69 y.o. male with a history of T2DM, metastatic pancreatic CA on chemotherapy, and HTN who presented 12/30 with hypoxia due to covid-19 pneumonia. Since admission, a full course of remdesivir and steroids were given with limited improvement, persistent, now severe, hypoxemia. Steroids have been restarted, CTA chest has shown confluent pneumonia and no pulmonary embolism. Broad spectrum antibiotics have been added empirically, though the patient's respiratory status has only deteriorated.  Assessment & Plan: Principal Problem:   Pneumonia due to COVID-19 virus Active Problems:   Pancreatic cancer ([place])   Acute respiratory failure with hypoxia ([facility])   Type 2 diabetes mellitus ([facility])   HLD (hyperlipidemia)   Goals of care DO NOT RESUSCITATE No bipap - Lengthy discussion daily at bedside with patient and again with wife over the phone, more than 30 minutes was spent discussing patient's prognosis, goals of care and current condition - Continue DNR per discussion that a ventilator or CPR would not change patient's ultimate outcome and would likely only increase patient's suffering and decreased quality of life which is their main goal - Family previously had goals of care discussion amongst the patient his wife their 2 sons as well as the patient's sister, appeared to want to continue aggressive medical management but agreed that intubation and CPR would be of little benefit. - Family able to visit on 08/21/19 as patient was deteriorating and we were concerned for demise - somewhat more stable over the past 24h - Given patient's diagnosis of metastatic pancreatic cancer on palliative treatment with Dr. Morey Hummingbird certainly palliative care and comfort measures would be reasonable transition for the  patient. - Patient will be off contact precautions as of 08/27/19 per protocol- consider transfer to main campus so family can visit patient if oxygen to improve enough to support transfer.   Acute hypoxemic respiratory failure due to covid-19 pneumonia, possible superimposed bacterial pneumonia: SARS-CoV-2 Ag+ 12/30. Protracted hospitalization in immunocompromised patient.  - Patient continues on heated high flow - able to wean off overlying NRB - Attempt to keep sats 88-90% to wean oxygen - very poor exertional status - markedly hypoxic with minimal movement and prolonged recovery time. - Lengthy discussion as above given patient's worsening condition and completed maximum medical therapy there is very little left to offer the patient/familiy other than supportive care/oxygen/physical therapy - Discussed comfort measures and palliative care as above, family continues to await for recovery and continues to hope for the best despite multiple conversations about poor prognosis. SpO2: 95 % O2 Flow Rate (L/min): 25 L/min FiO2 (%): 70 %  Recent Labs    08/24/19 0520 08/25/19 0615 08/25/19 0620 08/26/19 0500  DDIMER 3.73* 3.43*  --  2.93*  CRP 2.1*  --  1.1* 0.9  - s/p remdesivir x5 days stop 08/11/19 - Steroids ongoing since 12/30. CRP previously rising for several days around clinical worsening - methylpred weaned from 40q8h --> 20q12h now - continue prolonged wean given tenuous but improving status. 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 Most recently received promacta on 1/4 for marrow stimulation, though this was stopped due to thrombosis risk.  - Patient's oncologist is aware of admission. Continue to  delay chemotherapy at this time.  T2DM:  - Well-controlled  chronically, HbA1c 6.6%.  - Increase sliding scale insulin to resistant during acute illness and ongoing use of steroids. - Continue Lantus 20u HS  Hyperlipidemia:  - Continue statin. LFTs improving, minimally elevated.  Anemia of malignancy:  - Stable, no evidence of blood loss.  - Continue monitoring.   DVT prophylaxis: Lovenox 0.5mg /kg q12h Code Status: Patient transition to back to DNR after lengthy discussion with patient and wife on 08/21/2019  Family Communication: Wife by phone - over 30 minutes spent discussing goals of care, current illness, and prognosis Disposition Plan: Patient's prognosis is quite grim at this time, given respiratory status without ability to escalate care.  Lengthy discussion with patient and wife about prognosis, worsening condition, given chronic comorbid conditions patient remains high risk at baseline given previously on palliative chemotherapy.  Continue to discuss possible comfort measures and palliative care as patient's current condition continues to evolve.  Consultants:   Palliative care medicine team  Procedures:   None  Antimicrobials:  Remdesivir  Cefepime, vancomycin   Subjective: No acute events overnight, clinically feels quite well but still somewhat fatigued.  Remains in good spirits, clinically appears improved today denies chest pain, nausea, vomiting, diarrhea, constipation, headache, fevers, chills.  Looking forward to attempting to ambulate later and sit up to chair again as he tolerated quite well the last 2 days for a few hours.  Remains somewhat dyspneic with exertion but at rest feels quite well on heated high flow nasal cannula alone, no longer on nonrebreather.  Objective: Vitals:   08/26/19 0005 08/26/19 0020 08/26/19 0023 08/26/19 0400  BP:  113/80  114/90  Pulse: (!) 109  (!) 109 (!) 104  Resp: (!) 22  (!) 28 (!) 23  Temp:  98.9 F (37.2 C)  (!) 97.4 F (36.3 C)  TempSrc:  Axillary  Axillary  SpO2: 95%  (!) 63%  95%  Weight:      Height:        Intake/Output Summary (Last 24 hours) at 08/26/2019 0818 Last data filed at 08/26/2019 0400 Gross per 24 hour  Intake --  Output 1100 ml  Net -1100 ml   Filed Weights   08/06/19 2031 08/21/19 0200  Weight: 105.2 kg 91.6 kg    Gen: Ill-appearing male in no acute distress, but dyspneic with conversation Pulm: Tachypneic, mildly labored using heated high flow with overlying nonrebreather  CV: Regular tachycardia. No murmur, rub, or gallop. No JVD, no pedal edema. GI: Abdomen soft, non-tender, non-distended, with normoactive bowel sounds. No organomegaly or masses felt. Ext: Warm, decreased muscle bulk Skin: No rashes, lesions or ulcers Neuro: Alert and oriented. No focal neurological deficits.  Data Reviewed: I have personally reviewed following labs and imaging studies  CBC: Recent Labs  Lab 08/22/19 0450 08/23/19 0219 08/24/19 0520 08/25/19 0615 08/26/19 0500  WBC 18.5* 20.3* 19.2* 17.5* 16.7*  NEUTROABS 17.1* 18.7* 17.5* 15.4* 14.6*  HGB 12.1* 13.4 13.1 12.7* 12.9*  HCT 38.1* 41.1 40.9 40.0 40.9  MCV 100.3* 99.8 100.5* 102.0* 101.5*  PLT 226 292 227 205 [id number]   Basic Metabolic Panel: Recent Labs  Lab 08/20/19 0855 08/20/19 2301 08/21/19 0027 08/21/19 0031 08/21/19 0236 08/22/19 0450 08/23/19 0219 08/24/19 0520 08/25/19 0615 08/26/19 0500  NA 140 139 140  --  139  --   --   --   --   --   K 4.4 4.3 4.6  --  4.8  --   --   --   --   --  CL 105  --  106  --   --   --   --   --   --   --   CO2 25  --  22  --   --   --   --   --   --   --   GLUCOSE 153*  --  184*  --   --   --   --   --   --   --   BUN 30*  --  31*  --   --   --   --   --   --   --   CREATININE 0.79  --  0.72  --   --   --   --   --   --   --   CALCIUM 8.9  --  8.7*  --   --   --   --   --   --   --   MG 2.4  --   --    < >  --  2.4 2.6* 2.3 2.3 2.2  PHOS 3.9  --   --    < >  --  3.2 3.2 4.0 3.6 3.3   < > = values in this interval not displayed.    GFR: Estimated Creatinine Clearance: 111.5 mL/min (by C-G formula based on SCr of 0.72 mg/dL). Liver Function Tests: Recent Labs  Lab 08/20/19 0855 08/21/19 0027  AST 32 42*  ALT 91* 108*  ALKPHOS 81 82  BILITOT 0.9 1.1  PROT 7.3 7.0  ALBUMIN 2.8* 2.6*   CBG: Recent Labs  Lab 08/25/19 0845 08/25/19 1234 08/25/19 1646 08/25/19 2026 08/26/19 0032  GLUCAP 128* 179* 91 97 183*   Urine analysis:    Component Value Date/Time   COLORURINE YELLOW 08/04/2019 [place] 08/04/2019 1119   LABSPEC 1.021 08/04/2019 1119   PHURINE 5.0 08/04/2019 1119   GLUCOSEU NEGATIVE 08/04/2019 1119   HGBUR NEGATIVE 08/04/2019 1119   BILIRUBINUR NEGATIVE 08/04/2019 1119   KETONESUR 5 (A) 08/04/2019 1119   PROTEINUR 30 (A) 08/04/2019 1119   NITRITE NEGATIVE 08/04/2019 1119   LEUKOCYTESUR NEGATIVE 08/04/2019 1119   Recent Results (from the past 240 hour(s))  MRSA PCR Screening     Status: None   Collection Time: 08/22/19  4:50 AM   Specimen: Nasal Mucosa; Nasopharyngeal  Result Value Ref Range Status   MRSA by PCR NEGATIVE NEGATIVE Final    Comment:        The GeneXpert MRSA Assay (FDA approved for NASAL specimens only), is one component of a comprehensive MRSA colonization surveillance program. It is not intended to diagnose MRSA infection nor to guide or monitor treatment for MRSA infections. Performed at [facility], [place] [address] [place], [place] [address]       Radiology Studies: No results found.  Scheduled Meds: . vitamin C  500 mg Oral BID  . aspirin EC  81 mg Oral QPC breakfast  . Chlorhexidine Gluconate Cloth  6 each Topical Daily  . cholecalciferol  2,000 Units Oral Daily  . enoxaparin (LOVENOX) injection  40 mg Subcutaneous Q24H  . feeding supplement (ENSURE ENLIVE)  237 mL Oral TID BM  . gabapentin  300 mg Oral QHS  . insulin aspart  0-20 Units Subcutaneous TID AC & HS  . insulin aspart  4 Units Subcutaneous TID WC  .  insulin glargine  20 Units Subcutaneous QHS  . mouth rinse  15 mL Mouth Rinse BID  . methylPREDNISolone (SOLU-MEDROL) injection  20 mg Intravenous Q12H  . simvastatin  20 mg Oral QPM  . zinc sulfate  220 mg Oral Daily   Continuous Infusions:   LOS: 19 days   Time spent: 35 minutes.  Little Ishikawa, DO [facility] Pager: Epic chat 08/26/2019, 8:18 AM

## 2019-08-26 NOTE — Progress Notes (Signed)
Will put in melatonin 3mg  qhs PRN as requested by Dr. Andria Frames.  Onnie Boer, PharmD, BCIDP, AAHIVP, CPP Infectious Disease Pharmacist 08/26/2019 9:16 PM

## 2019-08-27 ENCOUNTER — Ambulatory Visit: Payer: BC Managed Care – PPO

## 2019-08-27 ENCOUNTER — Ambulatory Visit: Payer: BC Managed Care – PPO | Admitting: Hematology

## 2019-08-27 ENCOUNTER — Other Ambulatory Visit: Payer: BC Managed Care – PPO

## 2019-08-27 ENCOUNTER — Inpatient Hospital Stay (HOSPITAL_COMMUNITY): Payer: Medicare Other

## 2019-08-27 DIAGNOSIS — E118 Type 2 diabetes mellitus with unspecified complications: Secondary | ICD-10-CM

## 2019-08-27 DIAGNOSIS — R05 Cough: Secondary | ICD-10-CM

## 2019-08-27 DIAGNOSIS — E1165 Type 2 diabetes mellitus with hyperglycemia: Secondary | ICD-10-CM

## 2019-08-27 DIAGNOSIS — R0602 Shortness of breath: Secondary | ICD-10-CM

## 2019-08-27 LAB — COMPREHENSIVE METABOLIC PANEL
ALT: 51 U/L — ABNORMAL HIGH (ref 0–44)
AST: 29 U/L (ref 15–41)
Albumin: 2.8 g/dL — ABNORMAL LOW (ref 3.5–5.0)
Alkaline Phosphatase: 91 U/L (ref 38–126)
Anion gap: 9 (ref 5–15)
BUN: 36 mg/dL — ABNORMAL HIGH (ref 8–23)
CO2: 31 mmol/L (ref 22–32)
Calcium: 8.3 mg/dL — ABNORMAL LOW (ref 8.9–10.3)
Chloride: 100 mmol/L (ref 98–111)
Creatinine, Ser: 0.7 mg/dL (ref 0.61–1.24)
GFR calc Af Amer: >60 mL/min (ref >60)
GFR calc non Af Amer: >60 mL/min (ref >60)
Glucose, Bld: 218 mg/dL — ABNORMAL HIGH (ref 70–99)
Potassium: 4.9 mmol/L (ref 3.5–5.1)
Sodium: 140 mmol/L (ref 135–145)
Total Bilirubin: 1.4 mg/dL — ABNORMAL HIGH (ref 0.3–1.2)
Total Protein: 6.7 g/dL (ref 6.5–8.1)

## 2019-08-27 LAB — CBC WITH DIFFERENTIAL/PLATELET
Abs Immature Granulocytes: 0.08 10*3/uL — ABNORMAL HIGH (ref 0.00–0.07)
Abs Immature Granulocytes: 0.09 10*3/uL — ABNORMAL HIGH (ref 0.00–0.07)
Basophils Absolute: 0 10*3/uL (ref 0.0–0.1)
Basophils Absolute: 0 10*3/uL (ref 0.0–0.1)
Basophils Relative: 0 %
Basophils Relative: 0 %
Eosinophils Absolute: 0.3 10*3/uL (ref 0.0–0.5)
Eosinophils Absolute: 0.1 10*3/uL (ref 0.0–0.5)
Eosinophils Relative: 2 %
Eosinophils Relative: 1 %
HCT: 40.5 % (ref 39.0–52.0)
HCT: 40.2 % (ref 39.0–52.0)
Hemoglobin: 12.9 g/dL — ABNORMAL LOW (ref 13.0–17.0)
Hemoglobin: 13.1 g/dL (ref 13.0–17.0)
Immature Granulocytes: 1 %
Immature Granulocytes: 1 %
Lymphocytes Relative: 5 %
Lymphocytes Relative: 3 %
Lymphs Abs: 0.7 10*3/uL (ref 0.7–4.0)
Lymphs Abs: 0.5 10*3/uL — ABNORMAL LOW (ref 0.7–4.0)
MCH: 32.1 pg (ref 26.0–34.0)
MCH: 32.6 pg (ref 26.0–34.0)
MCHC: 31.9 g/dL (ref 30.0–36.0)
MCHC: 32.6 g/dL (ref 30.0–36.0)
MCV: 100.7 fL — ABNORMAL HIGH (ref 80.0–100.0)
MCV: 100 fL (ref 80.0–100.0)
Monocytes Absolute: 0.7 10*3/uL (ref 0.1–1.0)
Monocytes Absolute: 0.5 10*3/uL (ref 0.1–1.0)
Monocytes Relative: 5 %
Monocytes Relative: 3 %
Neutro Abs: 12.8 10*3/uL — ABNORMAL HIGH (ref 1.7–7.7)
Neutro Abs: 14.3 10*3/uL — ABNORMAL HIGH (ref 1.7–7.7)
Neutrophils Relative %: 87 %
Neutrophils Relative %: 92 %
Platelets: 183 10*3/uL (ref 150–400)
Platelets: 202 10*3/uL (ref 150–400)
RBC: 4.02 MIL/uL — ABNORMAL LOW (ref 4.22–5.81)
RBC: 4.02 MIL/uL — ABNORMAL LOW (ref 4.22–5.81)
RDW: 14.5 % (ref 11.5–15.5)
RDW: 14.4 % (ref 11.5–15.5)
WBC: 14.6 10*3/uL — ABNORMAL HIGH (ref 4.0–10.5)
WBC: 15.5 10*3/uL — ABNORMAL HIGH (ref 4.0–10.5)
nRBC: 0 % (ref 0.0–0.2)
nRBC: 0 % (ref 0.0–0.2)

## 2019-08-27 LAB — GLUCOSE, CAPILLARY
Glucose-Capillary: 107 mg/dL — ABNORMAL HIGH (ref 70–99)
Glucose-Capillary: 124 mg/dL — ABNORMAL HIGH (ref 70–99)
Glucose-Capillary: 143 mg/dL — ABNORMAL HIGH (ref 70–99)
Glucose-Capillary: 163 mg/dL — ABNORMAL HIGH (ref 70–99)
Glucose-Capillary: 242 mg/dL — ABNORMAL HIGH (ref 70–99)
Glucose-Capillary: 276 mg/dL — ABNORMAL HIGH (ref 70–99)
Glucose-Capillary: 72 mg/dL (ref 70–99)

## 2019-08-27 LAB — PHOSPHORUS
Phosphorus: 3.6 mg/dL (ref 2.5–4.6)
Phosphorus: 3.5 mg/dL (ref 2.5–4.6)

## 2019-08-27 LAB — MAGNESIUM
Magnesium: 2.2 mg/dL (ref 1.7–2.4)
Magnesium: 2.2 mg/dL (ref 1.7–2.4)

## 2019-08-27 LAB — D-DIMER, QUANTITATIVE: D-Dimer, Quant: 2.8 ug/mL-FEU — ABNORMAL HIGH (ref 0.00–0.50)

## 2019-08-27 LAB — C-REACTIVE PROTEIN: CRP: 1.4 mg/dL — ABNORMAL HIGH (ref <1.0)

## 2019-08-27 LAB — FERRITIN: Ferritin: 688 ng/mL — ABNORMAL HIGH (ref 24–336)

## 2019-08-27 MED ORDER — IPRATROPIUM-ALBUTEROL 20-100 MCG/ACT IN AERS
1.0000 | INHALATION_SPRAY | Freq: Four times a day (QID) | RESPIRATORY_TRACT | Status: DC
  Administered 2019-08-27 – 2019-09-03 (×27): 1 via RESPIRATORY_TRACT
  Filled 2019-08-27 (×2): qty 4

## 2019-08-27 NOTE — Progress Notes (Signed)
PROGRESS NOTE    Vincent [name]  [id number] DOB: [birth year] DOA: 08/06/2019 PCP: Leonard Downing, MD   Brief Narrative:  [phone] WM PMHx diabetes type 2 controlled with complication, HTN, and metastatic pancreatic CA on chemo   Presented to the [facility] ED w/ weakness and a cough for 5 days. In the ED he was found to be COVID+, febrile to 101.8, and hypoxic with sats of 80% on RA requirin 8L of support to improve. A CXR was w/o clear infiltrate but CTa noted diffuse B infiltrates c/w Covid   Subjective: 1/20 afebrile overnight, negative CP, negative abdominal pain.  Positive increased S OB    Assessment & Plan:   Principal Problem:   Pneumonia due to COVID-19 virus Active Problems:   Pancreatic cancer ([place])   Acute respiratory failure with hypoxia ([place])   Type 2 diabetes mellitus (HCC)   HLD (hyperlipidemia)   Covid pneumonia/acute respiratory failure with hypoxia COVID-19 Labs  Recent Labs    08/25/19 0615 08/25/19 0620 08/26/19 0500 08/27/19 0500  DDIMER 3.43*  --  2.93* 2.80*  CRP  --  1.1* 0.9 1.4*    12/30 POC SARS coronavirus positive  -NO Actemra as pt on chemotherapy  -Decadron 6 mg daily completed -Remdesivir per pharmacy protocol -Solu-Medrol 20 mg BID -Counseled patient concerning Covid convalescent plasma explained not proven therapy by randomized control trial.  Counseled that CCP is considered a treatment option for patients with severe disease.  Patient has agreed that we may use CCP if required -Vitamins per Covid protocol -Prone patient 16 hours/day.  If patient cannot tolerate prone 2 to 3 hours per shift -Vitamins per Covid protocol -Flutter valve -Incentive spirometry -Combivent -Titrate O2 to maintain SPO2> 88% -1/20 PCXR pending  Diabetes type 2 controlled with complication -Q000111Q hemoglobin A1c= 6.6 -Lantus 20 units qhs -NovoLog 4 units qac -Resistant SSI  HLD -Simvastatin 20 mg daily -1/2 LDL= 66  Metastatic prostate  cancer -Followed by Dr. Burr Medico          DVT prophylaxis: Lovenox Code Status: DNR Family Communication: 1/20 contacted Almyra Free (wife) to update her on plan of care, answered all questions. Disposition Plan: TBD   Consultants:    Procedures/Significant Events:  12/31 admit via [facility] ED - transfer to [facility]    I have personally reviewed and interpreted all radiology studies and my findings are as above.  VENTILATOR SETTINGS: HHFNC + NRB 1/20 Flow; 50 L/min FiO2; 100% SPO2; 96%   Cultures 12/30 POC SARS coronavirus positive 12/31 HIV screen negative   Antimicrobials: Anti-infectives (From admission, onward)   Start     Dose/Rate Stop   08/08/19 1000  remdesivir 100 mg in sodium chloride 0.9 % 100 mL IVPB     100 mg 200 mL/hr over 30 Minutes 08/12/19 0959   08/07/19 0200  remdesivir 200 mg in sodium chloride 0.9% 250 mL IVPB     200 mg 580 mL/hr over 30 Minutes 08/07/19 0341       Devices    LINES / TUBES:      Continuous Infusions:    Objective: Vitals:   08/27/19 0000 08/27/19 0342 08/27/19 0500 08/27/19 0743  BP: 108/80  115/84   Pulse: 93 93 (!) 101   Resp: 19 20 (!) 22   Temp: 97.8 F (36.6 C)  (!) 97.5 F (36.4 C) 97.9 F (36.6 C)  TempSrc: Oral  Oral Oral  SpO2: 94% 94% (!) 89%   Weight:      Height:  Intake/Output Summary (Last 24 hours) at 08/27/2019 1111 Last data filed at 08/26/2019 2200 Gross per 24 hour  Intake 240 ml  Output 300 ml  Net -60 ml   Filed Weights   08/06/19 2031 08/21/19 0200  Weight: 105.2 kg 91.6 kg    Physical Exam:  General: A/O x4, positive increasing acute respiratory distress Eyes: negative scleral hemorrhage, negative anisocoria, negative icterus ENT: Negative Runny nose, negative gingival bleeding, Neck:  Negative scars, masses, torticollis, lymphadenopathy, JVD Lungs: Tachypneic clear to auscultation bilaterally without wheezes or crackles Cardiovascular: Tachycardic without murmur gallop or  rub normal S1 and S2 Abdomen: negative abdominal pain, nondistended, positive soft, bowel sounds, no rebound, no ascites, no appreciable mass Extremities: No significant cyanosis, clubbing, or edema bilateral lower extremities Skin: Negative rashes, lesions, ulcers Psychiatric:  Negative depression, negative anxiety, negative fatigue, negative mania  Central nervous system:  Cranial nerves II through XII intact, tongue/uvula midline, all extremities muscle strength 5/5, sensation intact throughout, negative dysarthria, negative expressive aphasia, negative receptive aphasia.  .     Data Reviewed: Care during the described time interval was provided by me .  I have reviewed this patient's available data, including medical history, events of note, physical examination, and all test results as part of my evaluation.   CBC: Recent Labs  Lab 08/23/19 0219 08/24/19 0520 08/25/19 0615 08/26/19 0500 08/27/19 0500  WBC 20.3* 19.2* 17.5* 16.7* 14.6*  NEUTROABS 18.7* 17.5* 15.4* 14.6* 12.8*  HGB 13.4 13.1 12.7* 12.9* 12.9*  HCT 41.1 40.9 40.0 40.9 40.5  MCV 99.8 100.5* 102.0* 101.5* 100.7*  PLT 292 227 205 222 [id number]   Basic Metabolic Panel: Recent Labs  Lab 08/20/19 2301 08/21/19 0027 08/21/19 0031 08/21/19 0236 08/22/19 0450 08/23/19 0219 08/24/19 0520 08/25/19 0615 08/26/19 0500 08/27/19 0500  NA 139 140  --  139  --   --   --   --   --   --   K 4.3 4.6  --  4.8  --   --   --   --   --   --   CL  --  106  --   --   --   --   --   --   --   --   CO2  --  22  --   --   --   --   --   --   --   --   GLUCOSE  --  184*  --   --   --   --   --   --   --   --   BUN  --  31*  --   --   --   --   --   --   --   --   CREATININE  --  0.72  --   --   --   --   --   --   --   --   CALCIUM  --  8.7*  --   --   --   --   --   --   --   --   MG  --   --    < >  --    < > 2.6* 2.3 2.3 2.2 2.2  PHOS  --   --    < >  --    < > 3.2 4.0 3.6 3.3 3.6   < > = values in this interval not displayed.  GFR: Estimated Creatinine Clearance: 111.5 mL/min (by C-G formula based on SCr of 0.72 mg/dL). Liver Function Tests: Recent Labs  Lab 08/21/19 0027  AST 42*  ALT 108*  ALKPHOS 82  BILITOT 1.1  PROT 7.0  ALBUMIN 2.6*   No results for input(s): LIPASE, AMYLASE in the last 168 hours. No results for input(s): AMMONIA in the last 168 hours. Coagulation Profile: Recent Labs  Lab 08/21/19 0200  INR 1.2   Cardiac Enzymes: No results for input(s): CKTOTAL, CKMB, CKMBINDEX, TROPONINI in the last 168 hours. BNP (last 3 results) No results for input(s): PROBNP in the last 8760 hours. HbA1C: No results for input(s): HGBA1C in the last 72 hours. CBG: Recent Labs  Lab 08/26/19 1700 08/26/19 2137 08/27/19 0004 08/27/19 0459 08/27/19 0747  GLUCAP 150* 189* 143* 107* 124*   Lipid Profile: No results for input(s): CHOL, HDL, LDLCALC, TRIG, CHOLHDL, LDLDIRECT in the last 72 hours. Thyroid Function Tests: No results for input(s): TSH, T4TOTAL, FREET4, T3FREE, THYROIDAB in the last 72 hours. Anemia Panel: No results for input(s): VITAMINB12, FOLATE, FERRITIN, TIBC, IRON, RETICCTPCT in the last 72 hours. Urine analysis:    Component Value Date/Time   COLORURINE YELLOW 08/04/2019 1119   APPEARANCEUR CLEAR 08/04/2019 1119   LABSPEC 1.021 08/04/2019 1119   PHURINE 5.0 08/04/2019 1119   GLUCOSEU NEGATIVE 08/04/2019 1119   HGBUR NEGATIVE 08/04/2019 1119   BILIRUBINUR NEGATIVE 08/04/2019 1119   KETONESUR 5 (A) 08/04/2019 1119   PROTEINUR 30 (A) 08/04/2019 1119   NITRITE NEGATIVE 08/04/2019 1119   LEUKOCYTESUR NEGATIVE 08/04/2019 1119   Sepsis Labs: @LABRCNTIP (procalcitonin:4,lacticidven:4)  ) Recent Results (from the past 240 hour(s))  MRSA PCR Screening     Status: None   Collection Time: 08/22/19  4:50 AM   Specimen: Nasal Mucosa; Nasopharyngeal  Result Value Ref Range Status   MRSA by PCR NEGATIVE NEGATIVE Final    Comment:        The GeneXpert MRSA Assay (FDA approved  for NASAL specimens only), is one component of a comprehensive MRSA colonization surveillance program. It is not intended to diagnose MRSA infection nor to guide or monitor treatment for MRSA infections. Performed at [facility], [place] [address] [place], [place] [address]          Radiology Studies: No results found.      Scheduled Meds: . vitamin C  500 mg Oral BID  . aspirin EC  81 mg Oral QPC breakfast  . Chlorhexidine Gluconate Cloth  6 each Topical Daily  . cholecalciferol  2,000 Units Oral Daily  . enoxaparin (LOVENOX) injection  40 mg Subcutaneous Q24H  . feeding supplement (ENSURE ENLIVE)  237 mL Oral TID BM  . gabapentin  300 mg Oral QHS  . insulin aspart  0-20 Units Subcutaneous TID AC & HS  . insulin aspart  4 Units Subcutaneous TID WC  . insulin glargine  20 Units Subcutaneous QHS  . mouth rinse  15 mL Mouth Rinse BID  . methylPREDNISolone (SOLU-MEDROL) injection  20 mg Intravenous Q12H  . simvastatin  20 mg Oral QPM  . zinc sulfate  220 mg Oral Daily   Continuous Infusions:    LOS: 20 days   The patient is critically ill with multiple organ systems failure and requires high complexity decision making for assessment and support, frequent evaluation and titration of therapies, application of advanced monitoring technologies and extensive interpretation of multiple databases. Critical Care Time devoted to patient care services described in this note  Time spent: 40 minutes     Bri Wakeman, Geraldo Docker, MD [facility] Pager [phone]  If 7PM-7AM, please contact night-coverage [email or web address] Password [email or web address] 08/27/2019, 11:11 AM

## 2019-08-28 LAB — COMPREHENSIVE METABOLIC PANEL
ALT: 45 U/L — ABNORMAL HIGH (ref 0–44)
AST: 23 U/L (ref 15–41)
Albumin: 2.7 g/dL — ABNORMAL LOW (ref 3.5–5.0)
Alkaline Phosphatase: 83 U/L (ref 38–126)
Anion gap: 9 (ref 5–15)
BUN: 33 mg/dL — ABNORMAL HIGH (ref 8–23)
CO2: 31 mmol/L (ref 22–32)
Calcium: 8.3 mg/dL — ABNORMAL LOW (ref 8.9–10.3)
Chloride: 98 mmol/L (ref 98–111)
Creatinine, Ser: 0.83 mg/dL (ref 0.61–1.24)
GFR calc Af Amer: >60 mL/min (ref >60)
GFR calc non Af Amer: >60 mL/min (ref >60)
Glucose, Bld: 107 mg/dL — ABNORMAL HIGH (ref 70–99)
Potassium: 4.4 mmol/L (ref 3.5–5.1)
Sodium: 138 mmol/L (ref 135–145)
Total Bilirubin: 1.1 mg/dL (ref 0.3–1.2)
Total Protein: 6.5 g/dL (ref 6.5–8.1)

## 2019-08-28 LAB — CBC WITH DIFFERENTIAL/PLATELET
Abs Immature Granulocytes: 0.08 10*3/uL — ABNORMAL HIGH (ref 0.00–0.07)
Basophils Absolute: 0 10*3/uL (ref 0.0–0.1)
Basophils Relative: 0 %
Eosinophils Absolute: 0.3 10*3/uL (ref 0.0–0.5)
Eosinophils Relative: 2 %
HCT: 38.6 % — ABNORMAL LOW (ref 39.0–52.0)
Hemoglobin: 12.5 g/dL — ABNORMAL LOW (ref 13.0–17.0)
Immature Granulocytes: 1 %
Lymphocytes Relative: 4 %
Lymphs Abs: 0.7 10*3/uL (ref 0.7–4.0)
MCH: 33 pg (ref 26.0–34.0)
MCHC: 32.4 g/dL (ref 30.0–36.0)
MCV: 101.8 fL — ABNORMAL HIGH (ref 80.0–100.0)
Monocytes Absolute: 0.8 10*3/uL (ref 0.1–1.0)
Monocytes Relative: 5 %
Neutro Abs: 13.7 10*3/uL — ABNORMAL HIGH (ref 1.7–7.7)
Neutrophils Relative %: 88 %
Platelets: 197 10*3/uL (ref 150–400)
RBC: 3.79 MIL/uL — ABNORMAL LOW (ref 4.22–5.81)
RDW: 14.7 % (ref 11.5–15.5)
WBC: 15.6 10*3/uL — ABNORMAL HIGH (ref 4.0–10.5)
nRBC: 0 % (ref 0.0–0.2)

## 2019-08-28 LAB — MAGNESIUM: Magnesium: 2.3 mg/dL (ref 1.7–2.4)

## 2019-08-28 LAB — D-DIMER, QUANTITATIVE: D-Dimer, Quant: 2.37 ug/mL-FEU — ABNORMAL HIGH (ref 0.00–0.50)

## 2019-08-28 LAB — GLUCOSE, CAPILLARY
Glucose-Capillary: 116 mg/dL — ABNORMAL HIGH (ref 70–99)
Glucose-Capillary: 135 mg/dL — ABNORMAL HIGH (ref 70–99)
Glucose-Capillary: 182 mg/dL — ABNORMAL HIGH (ref 70–99)
Glucose-Capillary: 118 mg/dL — ABNORMAL HIGH (ref 70–99)
Glucose-Capillary: 82 mg/dL (ref 70–99)

## 2019-08-28 LAB — C-REACTIVE PROTEIN: CRP: 1.3 mg/dL — ABNORMAL HIGH (ref <1.0)

## 2019-08-28 LAB — FERRITIN: Ferritin: 611 ng/mL — ABNORMAL HIGH (ref 24–336)

## 2019-08-28 LAB — PHOSPHORUS: Phosphorus: 3.3 mg/dL (ref 2.5–4.6)

## 2019-08-28 NOTE — Progress Notes (Addendum)
PROGRESS NOTE    Vincent [name]  [id number] DOB: 1950/10/18 DOA: 08/06/2019 PCP: Leonard Downing, MD   Brief Narrative:  [phone] WM PMHx diabetes type 2 controlled with complication, HTN, and metastatic pancreatic CA on chemo   Presented to the [facility] ED w/ weakness and a cough for 5 days. In the ED he was found to be COVID+, febrile to 101.8, and hypoxic with sats of 80% on RA requirin 8L of support to improve. A CXR was w/o clear infiltrate but CTa noted diffuse B infiltrates c/w Covid   Subjective: 1/21 last 24 hours afebrile states slept well overnight, feels much better today.  Positive S OB    Assessment & Plan:   Principal Problem:   Pneumonia due to COVID-19 virus Active Problems:   Pancreatic cancer ([place])   Acute respiratory failure with hypoxia ([place])   Type 2 diabetes mellitus (HCC)   HLD (hyperlipidemia)   Covid pneumonia/acute respiratory failure with hypoxia COVID-19 Labs  Recent Labs    08/26/19 0500 08/27/19 0500 08/28/19 0430  DDIMER 2.93* 2.80* 2.37*  FERRITIN  --  688* 611*  CRP 0.9 1.4* 1.3*    12/30 POC SARS coronavirus positive  -NO Actemra as pt on chemotherapy  -Decadron 6 mg daily completed -Remdesivir per pharmacy protocol -Solu-Medrol 20 mg BID -Counseled patient concerning Covid convalescent plasma explained not proven therapy by randomized control trial.  Counseled that CCP is considered a treatment option for patients with severe disease.  Patient has agreed that we may use CCP if required -Vitamins per Covid protocol -Prone patient 16 hours/day.  If patient cannot tolerate prone 2 to 3 hours per shift -Titrate O2 to maintain SPO2> 88% -Vitamins per Covid protocol -Flutter valve -Incentive spirometry -Combivent -1/20 PCXR; slightly improved pneumonia LUL see results below  Diabetes type 2 controlled with complication -Q000111Q hemoglobin A1c= 6.6 -Lantus 20 units qhs -NovoLog 4 units qac -Resistant  SSI  HLD -Simvastatin 20 mg daily -1/2 LDL= 66  Metastatic prostate cancer -Followed by Dr. Burr Medico -Dx March 2020, on chemotherapy initially FOLFIRINOX, DC'ed oxaliplatin due to neuropathy and thrombocytopenia.  -1/4 most recently received promacta for marrow stimulation, though this was stopped due to thrombosis risk.  - Patient's oncologist is aware of admission. Continue to delay chemotherapy at this time.  Anemia of malignancy:  Recent Labs  Lab 08/25/19 0615 08/26/19 0500 08/27/19 0500 08/27/19 1230 08/28/19 0430  HGB 12.7* 12.9* 12.9* 13.1 12.5*  -Stable, no evidence of blood loss.  -Continue monitoring.       DVT prophylaxis: Lovenox Code Status: DNR Family Communication: 1/21 contacted Almyra Free (wife) to update her on plan of care, answered all questions. Disposition Plan: TBD   Consultants:    Procedures/Significant Events:  12/31 admit via [facility] ED - transfer to [facility] 1/20 PCXR;-BILATERAL pulmonary infiltrates consistent with multifocal pneumonia, slightly improved in LEFT upper lobe.   I have personally reviewed and interpreted all radiology studies and my findings are as above.  VENTILATOR SETTINGS: HHFNC + NRB 1/21 Flow; 40 L/min FiO2; 100% SPO2; 88%   Cultures 12/30 POC SARS coronavirus positive 12/31 HIV screen negative   Antimicrobials: Anti-infectives (From admission, onward)   Start     Dose/Rate Stop   08/08/19 1000  remdesivir 100 mg in sodium chloride 0.9 % 100 mL IVPB     100 mg 200 mL/hr over 30 Minutes 08/12/19 0959   08/07/19 0200  remdesivir 200 mg in sodium chloride 0.9% 250 mL IVPB  200 mg 580 mL/hr over 30 Minutes 08/07/19 0341       Devices    LINES / TUBES:      Continuous Infusions:    Objective: Vitals:   08/28/19 0600 08/28/19 0643 08/28/19 0644 08/28/19 0800  BP:    118/89  Pulse: 99 (!) 104 100 98  Resp: 18 (!) 25 18 18   Temp:    97.8 F (36.6 C)  TempSrc:    Oral  SpO2: (!) 87% (!) 81% (!) 86% (!)  88%  Weight:      Height:        Intake/Output Summary (Last 24 hours) at 08/28/2019 0831 Last data filed at 08/28/2019 0400 Gross per 24 hour  Intake 480 ml  Output 750 ml  Net -270 ml   Filed Weights   08/06/19 2031 08/21/19 0200  Weight: 105.2 kg 91.6 kg   Physical Exam:  General: A/O x4, positive acute respiratory distress Eyes: negative scleral hemorrhage, negative anisocoria, negative icterus ENT: Negative Runny nose, negative gingival bleeding, Neck:  Negative scars, masses, torticollis, lymphadenopathy, JVD Lungs: Clear to auscultation bilaterally without wheezes or crackles Cardiovascular: Regular rate and rhythm without murmur gallop or rub normal S1 and S2 Abdomen: negative abdominal pain, nondistended, positive soft, bowel sounds, no rebound, no ascites, no appreciable mass Extremities: No significant cyanosis, clubbing, or edema bilateral lower extremities Skin: Negative rashes, lesions, ulcers Psychiatric:  Negative depression, negative anxiety, negative fatigue, negative mania  Central nervous system:  Cranial nerves II through XII intact, tongue/uvula midline, all extremities muscle strength 5/5, sensation intact throughout, negative dysarthria, negative expressive aphasia, negative receptive aphasia.  .     Data Reviewed: Care during the described time interval was provided by me .  I have reviewed this patient's available data, including medical history, events of note, physical examination, and all test results as part of my evaluation.   CBC: Recent Labs  Lab 08/25/19 0615 08/26/19 0500 08/27/19 0500 08/27/19 1230 08/28/19 0430  WBC 17.5* 16.7* 14.6* 15.5* 15.6*  NEUTROABS 15.4* 14.6* 12.8* 14.3* 13.7*  HGB 12.7* 12.9* 12.9* 13.1 12.5*  HCT 40.0 40.9 40.5 40.2 38.6*  MCV 102.0* 101.5* 100.7* 100.0 101.8*  PLT 205 222 183 202 [id number]   Basic Metabolic Panel: Recent Labs  Lab 08/25/19 0615 08/26/19 0500 08/27/19 0500 08/27/19 1230 08/28/19 0430   NA  --   --   --  140 138  K  --   --   --  4.9 4.4  CL  --   --   --  100 98  CO2  --   --   --  31 31  GLUCOSE  --   --   --  218* 107*  BUN  --   --   --  36* 33*  CREATININE  --   --   --  0.70 0.83  CALCIUM  --   --   --  8.3* 8.3*  MG 2.3 2.2 2.2 2.2 2.3  PHOS 3.6 3.3 3.6 3.5 3.3   GFR: Estimated Creatinine Clearance: 107.5 mL/min (by C-G formula based on SCr of 0.83 mg/dL). Liver Function Tests: Recent Labs  Lab 08/27/19 1230 08/28/19 0430  AST 29 23  ALT 51* 45*  ALKPHOS 91 83  BILITOT 1.4* 1.1  PROT 6.7 6.5  ALBUMIN 2.8* 2.7*   No results for input(s): LIPASE, AMYLASE in the last 168 hours. No results for input(s): AMMONIA in the last 168 hours. Coagulation Profile: No results for  input(s): INR, PROTIME in the last 168 hours. Cardiac Enzymes: No results for input(s): CKTOTAL, CKMB, CKMBINDEX, TROPONINI in the last 168 hours. BNP (last 3 results) No results for input(s): PROBNP in the last 8760 hours. HbA1C: No results for input(s): HGBA1C in the last 72 hours. CBG: Recent Labs  Lab 08/27/19 1317 08/27/19 1655 08/27/19 2011 08/27/19 2312 08/28/19 0410  GLUCAP 242* 72 163* 276* 116*   Lipid Profile: No results for input(s): CHOL, HDL, LDLCALC, TRIG, CHOLHDL, LDLDIRECT in the last 72 hours. Thyroid Function Tests: No results for input(s): TSH, T4TOTAL, FREET4, T3FREE, THYROIDAB in the last 72 hours. Anemia Panel: Recent Labs    08/27/19 0500 08/28/19 0430  FERRITIN 688* 611*   Urine analysis:    Component Value Date/Time   COLORURINE YELLOW 08/04/2019 [place] 08/04/2019 1119   LABSPEC 1.021 08/04/2019 1119   PHURINE 5.0 08/04/2019 1119   GLUCOSEU NEGATIVE 08/04/2019 1119   HGBUR NEGATIVE 08/04/2019 1119   BILIRUBINUR NEGATIVE 08/04/2019 1119   KETONESUR 5 (A) 08/04/2019 1119   PROTEINUR 30 (A) 08/04/2019 1119   NITRITE NEGATIVE 08/04/2019 1119   LEUKOCYTESUR NEGATIVE 08/04/2019 1119   Sepsis  Labs: @LABRCNTIP (procalcitonin:4,lacticidven:4)  ) Recent Results (from the past 240 hour(s))  MRSA PCR Screening     Status: None   Collection Time: 08/22/19  4:50 AM   Specimen: Nasal Mucosa; Nasopharyngeal  Result Value Ref Range Status   MRSA by PCR NEGATIVE NEGATIVE Final    Comment:        The GeneXpert MRSA Assay (FDA approved for NASAL specimens only), is one component of a comprehensive MRSA colonization surveillance program. It is not intended to diagnose MRSA infection nor to guide or monitor treatment for MRSA infections. Performed at [facility], [place] [address] [place], [place] [address]          Radiology Studies: DG CHEST PORT 1 VIEW  Result Date: 08/27/2019 CLINICAL DATA:  Worsening respiratory status, COVID-19, diabetes mellitus, hypertension EXAM: PORTABLE CHEST 1 VIEW COMPARISON:  Portable exam 1151 hours compared to 08/21/2019 FINDINGS: RIGHT jugular Port-A-Cath with tip projecting over cavoatrial junction. Upper normal heart size. Mediastinal contours and pulmonary vascularity normal. Scattered BILATERAL pulmonary infiltrates RIGHT greater than LEFT consistent with multifocal pneumonia, slightly improved in LEFT upper lobe. No pleural effusion or pneumothorax. IMPRESSION: BILATERAL pulmonary infiltrates consistent with multifocal pneumonia, slightly improved in LEFT upper lobe. Electronically Signed   By: Lavonia Dana M.D.   On: 08/27/2019 11:58        Scheduled Meds: . vitamin C  500 mg Oral BID  . aspirin EC  81 mg Oral QPC breakfast  . Chlorhexidine Gluconate Cloth  6 each Topical Daily  . cholecalciferol  2,000 Units Oral Daily  . enoxaparin (LOVENOX) injection  40 mg Subcutaneous Q24H  . feeding supplement (ENSURE ENLIVE)  237 mL Oral TID BM  . gabapentin  300 mg Oral QHS  . insulin aspart  0-20 Units Subcutaneous TID AC & HS  . insulin aspart  4 Units Subcutaneous TID WC  . insulin glargine  20 Units Subcutaneous QHS  .  Ipratropium-Albuterol  1 puff Inhalation QID  . mouth rinse  15 mL Mouth Rinse BID  . methylPREDNISolone (SOLU-MEDROL) injection  20 mg Intravenous Q12H  . simvastatin  20 mg Oral QPM  . zinc sulfate  220 mg Oral Daily   Continuous Infusions:    LOS: 21 days   The patient is critically ill with multiple organ  systems failure and requires high complexity decision making for assessment and support, frequent evaluation and titration of therapies, application of advanced monitoring technologies and extensive interpretation of multiple databases. Critical Care Time devoted to patient care services described in this note  Time spent: 40 minutes     Wallie Lagrand, Geraldo Docker, MD [facility] Pager [phone]  If 7PM-7AM, please contact night-coverage [email or web address] Password [facility] 08/28/2019, 8:31 AM

## 2019-08-28 NOTE — Progress Notes (Signed)
Physical Therapy Treatment Patient Details Name: Vincent [name] MRN: [id number] DOB: 1951/06/02 Today's Date: 08/28/2019    History of Present Illness 69 y/o male with hx of T2DM, metastatic pancreatic CA on chemotherapy, and HTN who presented 12/30 with hypoxia due to covid-19 pneumonia. Since admission, a full course of remdesivir and steroids were given with limited improvement, persistent, now severe, hypoxemia. Steroids have been restarted, CTA chest has shown confluent pneumonia and no pulmonary embolism. Broad spectrum antibiotics have been added empirically, though the patient's respiratory status has only deteriorated.    PT Comments    Pt participating as able, he is quite apprehensive about mobility but understands importance of it. He continues to be on 15L NRB and 40L HHFNC and did desat to low 70s with supine to sit to stand and pivot to recliner. He seemed to be more aware and able to calmly complete pursed lip breathing once in recliner to recover saturations to mid-high 80s. Pt needed set up, line management and stand by to min guard assist to complete transition.     Follow Up Recommendations  Supervision/Assistance - 24 hour     Equipment Recommendations  None recommended by PT    Recommendations for Other Services       Precautions / Restrictions Precautions Precautions: Fall;Other (comment) Precaution Comments: 02 desat to 70s w/ minimal activity Restrictions Weight Bearing Restrictions: No    Mobility  Bed Mobility Overal bed mobility: Needs Assistance Bed Mobility: Supine to Sit     Supine to sit: Supervision     General bed mobility comments: needs set up and line management assist to complete  Transfers Overall transfer level: Needs assistance Equipment used: None Transfers: Sit to/from Stand;Stand Pivot Transfers Sit to Stand: Min guard Stand pivot transfers: Min guard          Ambulation/Gait             General Gait  Details: did not attempt at this time sec to apprehension   Stairs             Wheelchair Mobility    Modified Rankin (Stroke Patients Only)       Balance Overall balance assessment: Mild deficits observed, not formally tested   Sitting balance-Leahy Scale: Fair     Standing balance support: During functional activity Standing balance-Leahy Scale: Fair                              Cognition Arousal/Alertness: Lethargic Behavior During Therapy: WFL for tasks assessed/performed Overall Cognitive Status: Within Functional Limits for tasks assessed                                        Exercises      General Comments        Pertinent Vitals/Pain Pain Assessment: No/denies pain    Home Living                      Prior Function            PT Goals (current goals can now be found in the care plan section) Acute Rehab PT Goals Patient Stated Goal: Pt had been apprehensive about mobility prior to therapist arrival but pt is greeable to mobilizing with therapist Time For Goal Achievement: 09/09/19 Potential to Achieve Goals: Fair Progress towards PT  goals: Not progressing toward goals - comment(overall prognosis is not promising sec to Cancer and COVID)    Frequency    Min 3X/week      PT Plan Current plan remains appropriate    Co-evaluation              AM-PAC PT "6 Clicks" Mobility   Outcome Measure  Help needed turning from your back to your side while in a flat bed without using bedrails?: None Help needed moving from lying on your back to sitting on the side of a flat bed without using bedrails?: None Help needed moving to and from a bed to a chair (including a wheelchair)?: A Little Help needed standing up from a chair using your arms (e.g., wheelchair or bedside chair)?: A Little Help needed to walk in hospital room?: A Lot Help needed climbing 3-5 steps with a railing? : A Lot 6 Click Score:  18    End of Session Equipment Utilized During Treatment: Oxygen Activity Tolerance: Patient limited by fatigue;Patient limited by lethargy;Treatment limited secondary to medical complications (Comment) Patient left: in chair;with call bell/phone within reach Nurse Communication: Mobility status PT Visit Diagnosis: Unsteadiness on feet (R26.81)     Time: [id number] PT Time Calculation (min) (ACUTE ONLY): 19 min  Charges:  $Therapeutic Activity: 8-22 mins                     Horald Chestnut, PT    Delford Field 08/28/2019, 1:13 PM

## 2019-08-28 NOTE — Progress Notes (Signed)
Nutrition Follow-up RD working remotely.  DOCUMENTATION CODES:   Not applicable  INTERVENTION:     Continue Ensure Enlive po TID, each supplement provides 350 kcal and 20 grams of protein.  Continue Magic cup BID with lunch and dinner, each supplement provides 290 kcal and 9 grams of protein.   NUTRITION DIAGNOSIS:   Increased nutrient needs related to acute illness(COVID PNA) as evidenced by estimated needs.  Ongoing  GOAL:   Patient will meet greater than or equal to 90% of their needs   Progressing  MONITOR:   PO intake, Supplement acceptance, Labs  ASSESSMENT:   69 yo male admitted with acute respiratory failure related to COVID-19 PNA. PMH includes DM-2, HTN, HLD, metastatic pancreatic cancer, last chemo 12/16.   Patient is currently on a CHO modified diet consuming 10-25% of meals 1/19-1/21. He is also drinking Ensure Enlive 2-3 times every day.   Currently on 35 L high flow oxygen and NRB mask.  Labs reviewed.  CBG's: [phone]  Medications reviewed.   No new weight available since 1/14.  Diet Order:   Diet Order            Diet Carb Modified Fluid consistency: Thin; Room service appropriate? Yes  Diet effective now              EDUCATION NEEDS:   Not appropriate for education at this time  Skin:  Skin Assessment: Reviewed RN Assessment  Last BM:  1/20  Height:   Ht Readings from Last 1 Encounters:  08/21/19 6\' 4"  (1.93 m)    Weight:   Wt Readings from Last 1 Encounters:  08/21/19 91.6 kg    Ideal Body Weight:  91.8 kg  BMI:  Body mass index is 24.58 kg/m.  Estimated Nutritional Needs:   Kcal:  [id number]  Protein:  130-150 gm  Fluid:  >/= 2.5 L    Molli Barrows, RD, LDN, [place] Pager [phone] After Hours Pager [phone]

## 2019-08-29 LAB — CBC WITH DIFFERENTIAL/PLATELET
Abs Immature Granulocytes: 0.06 10*3/uL (ref 0.00–0.07)
Basophils Absolute: 0 10*3/uL (ref 0.0–0.1)
Basophils Relative: 0 %
Eosinophils Absolute: 0.2 10*3/uL (ref 0.0–0.5)
Eosinophils Relative: 1 %
HCT: 37.5 % — ABNORMAL LOW (ref 39.0–52.0)
Hemoglobin: 12.1 g/dL — ABNORMAL LOW (ref 13.0–17.0)
Immature Granulocytes: 1 %
Lymphocytes Relative: 4 %
Lymphs Abs: 0.5 10*3/uL — ABNORMAL LOW (ref 0.7–4.0)
MCH: 32.8 pg (ref 26.0–34.0)
MCHC: 32.3 g/dL (ref 30.0–36.0)
MCV: 101.6 fL — ABNORMAL HIGH (ref 80.0–100.0)
Monocytes Absolute: 0.6 10*3/uL (ref 0.1–1.0)
Monocytes Relative: 4 %
Neutro Abs: 11.8 10*3/uL — ABNORMAL HIGH (ref 1.7–7.7)
Neutrophils Relative %: 90 %
Platelets: 177 10*3/uL (ref 150–400)
RBC: 3.69 MIL/uL — ABNORMAL LOW (ref 4.22–5.81)
RDW: 14.8 % (ref 11.5–15.5)
WBC: 13 10*3/uL — ABNORMAL HIGH (ref 4.0–10.5)
nRBC: 0 % (ref 0.0–0.2)

## 2019-08-29 LAB — GLUCOSE, CAPILLARY
Glucose-Capillary: 122 mg/dL — ABNORMAL HIGH (ref 70–99)
Glucose-Capillary: 130 mg/dL — ABNORMAL HIGH (ref 70–99)
Glucose-Capillary: 187 mg/dL — ABNORMAL HIGH (ref 70–99)
Glucose-Capillary: 193 mg/dL — ABNORMAL HIGH (ref 70–99)
Glucose-Capillary: 200 mg/dL — ABNORMAL HIGH (ref 70–99)
Glucose-Capillary: 64 mg/dL — ABNORMAL LOW (ref 70–99)
Glucose-Capillary: 96 mg/dL (ref 70–99)

## 2019-08-29 LAB — COMPREHENSIVE METABOLIC PANEL
ALT: 46 U/L — ABNORMAL HIGH (ref 0–44)
AST: 25 U/L (ref 15–41)
Albumin: 2.7 g/dL — ABNORMAL LOW (ref 3.5–5.0)
Alkaline Phosphatase: 82 U/L (ref 38–126)
Anion gap: 7 (ref 5–15)
BUN: 33 mg/dL — ABNORMAL HIGH (ref 8–23)
CO2: 32 mmol/L (ref 22–32)
Calcium: 8.4 mg/dL — ABNORMAL LOW (ref 8.9–10.3)
Chloride: 99 mmol/L (ref 98–111)
Creatinine, Ser: 0.81 mg/dL (ref 0.61–1.24)
GFR calc Af Amer: >60 mL/min (ref >60)
GFR calc non Af Amer: >60 mL/min (ref >60)
Glucose, Bld: 178 mg/dL — ABNORMAL HIGH (ref 70–99)
Potassium: 4.6 mmol/L (ref 3.5–5.1)
Sodium: 138 mmol/L (ref 135–145)
Total Bilirubin: 0.7 mg/dL (ref 0.3–1.2)
Total Protein: 6 g/dL — ABNORMAL LOW (ref 6.5–8.1)

## 2019-08-29 LAB — MAGNESIUM: Magnesium: 2.1 mg/dL (ref 1.7–2.4)

## 2019-08-29 LAB — FERRITIN: Ferritin: 605 ng/mL — ABNORMAL HIGH (ref 24–336)

## 2019-08-29 LAB — PHOSPHORUS: Phosphorus: 3.8 mg/dL (ref 2.5–4.6)

## 2019-08-29 LAB — D-DIMER, QUANTITATIVE: D-Dimer, Quant: 2.41 ug/mL-FEU — ABNORMAL HIGH (ref 0.00–0.50)

## 2019-08-29 LAB — C-REACTIVE PROTEIN: CRP: 0.9 mg/dL (ref <1.0)

## 2019-08-29 MED ORDER — LORAZEPAM 2 MG/ML IJ SOLN
1.0000 mg | INTRAMUSCULAR | Status: DC | PRN
  Administered 2019-08-29: 18:00:00 1 mg via INTRAVENOUS
  Filled 2019-08-29: qty 1

## 2019-08-29 MED ORDER — ALBUTEROL SULFATE HFA 108 (90 BASE) MCG/ACT IN AERS
2.0000 | INHALATION_SPRAY | RESPIRATORY_TRACT | Status: DC | PRN
  Administered 2019-08-29 – 2019-08-31 (×3): 2 via RESPIRATORY_TRACT
  Filled 2019-08-29: qty 6.7

## 2019-08-29 MED ORDER — LORAZEPAM 2 MG/ML IJ SOLN
0.5000 mg | INTRAMUSCULAR | Status: DC | PRN
  Administered 2019-08-30 – 2019-08-31 (×3): 1 mg via INTRAVENOUS
  Filled 2019-08-29 (×3): qty 1

## 2019-08-29 MED ORDER — DEXTROSE 50 % IV SOLN
25.0000 g | INTRAVENOUS | Status: AC
  Administered 2019-08-29: 25 g via INTRAVENOUS
  Filled 2019-08-29: qty 50

## 2019-08-29 NOTE — Progress Notes (Signed)
Updated wife Bethena Roys on pt's status. Wife was made aware of pt's critical status and high amount of O2 that he is requiring. Questions and concerns addressed. No other issues at this time.

## 2019-08-29 NOTE — Progress Notes (Signed)
Notified Dr. Vanita Ingles for confirmation on holding night time insulins due to prior hypoglycemia episode. Ordered to not give insulin for tonight. No other issues at this time.

## 2019-08-29 NOTE — Progress Notes (Addendum)
PROGRESS NOTE    Vincent [name]  [id number] DOB: 07/04/1951 DOA: 08/06/2019 PCP: Leonard Downing, MD   Brief Narrative:  [id number] WM PMHx diabetes type 2 controlled with complication, HTN, and metastatic pancreatic CA on chemo   Presented to the [facility] ED w/ weakness and a cough for 5 days. In the ED he was found to be COVID+, febrile to 101.8, and hypoxic with sats of 80% on RA requirin 8L of support to improve. A CXR was w/o clear infiltrate but CTa noted diffuse B infiltrates c/w Covid   Subjective: 1/22 A/O x4, overnight patient decompensated and O2 had to be increased, to level that ambulance could not safely transport patient.  Patient again on [place] + NRB.  Any exertion at all drops patient is SPO2 70s to 60s and patient becomes unresponsive.    Assessment & Plan:   Principal Problem:   Pneumonia due to COVID-19 virus Active Problems:   Pancreatic cancer ([place])   Acute respiratory failure with hypoxia ([facility])   Type 2 diabetes mellitus (HCC)   HLD (hyperlipidemia)   Covid pneumonia/acute respiratory failure with hypoxia COVID-19 Labs  Recent Labs    08/27/19 0500 08/28/19 0430 08/29/19 0400  DDIMER 2.80* 2.37* 2.41*  FERRITIN 688* 611* 605*  CRP 1.4* 1.3* 0.9    12/30 POC SARS coronavirus positive  -NO Actemra as pt on chemotherapy  -Decadron 6 mg daily completed -Remdesivir per pharmacy protocol -Solu-Medrol 20 mg BID -Counseled patient concerning Covid convalescent plasma explained not proven therapy by randomized control trial.  Counseled that CCP is considered a treatment option for patients with severe disease.  Patient has agreed that we may use CCP if required -Vitamins per Covid protocol -Prone patient 16 hours/day.  If patient cannot tolerate prone 2 to 3 hours per shift -Titrate O2 to maintain SPO2> 88% -Vitamins per Covid protocol -Flutter valve -Incentive spirometry -Combivent -1/20 PCXR; slightly improved pneumonia LUL see results  below  Diabetes type 2 controlled with complication -[id number] hemoglobin A1c= 6.6 -Lantus 20 units qhs -NovoLog 4 units qac -Resistant SSI  HLD -Simvastatin 20 mg daily -1/2 LDL= 66  Metastatic prostate cancer -Followed by Dr. Burr Medico -Dx March 2020, on chemotherapy initially FOLFIRINOX, DC'ed oxaliplatin due to neuropathy and thrombocytopenia.  -1/4 most recently received promacta for marrow stimulation, though this was stopped due to thrombosis risk.  - Patient's oncologist is aware of admission. Continue to delay chemotherapy at this time.  Anemia of malignancy:  Recent Labs  Lab 08/26/19 0500 08/27/19 0500 08/27/19 1230 08/28/19 0430 08/29/19 0400  HGB 12.9* 12.9* 13.1 12.5* 12.1*  -Stable, no evidence of blood loss.  -Continue monitoring.       DVT prophylaxis: Lovenox Code Status: DNR Family Communication: 1/22 contacted Almyra Free (wife) to update her on plan of care, answered all questions. Disposition Plan: TBD   Consultants:    Procedures/Significant Events:  12/31 admit via [facility] ED - transfer to [facility] 1/20 PCXR;-BILATERAL pulmonary infiltrates consistent with multifocal pneumonia, slightly improved in LEFT upper lobe.   I have personally reviewed and interpreted all radiology studies and my findings are as above.  VENTILATOR SETTINGS: HHFNC + NRB 1/22  flow; 15 L/min FiO2; 100% SPO2; 94%   Cultures 12/30 POC SARS coronavirus positive 12/31 HIV screen negative   Antimicrobials: Anti-infectives (From admission, onward)   Start     Dose/Rate Stop   08/08/19 1000  remdesivir 100 mg in sodium chloride 0.9 % 100 mL IVPB     100 mg  200 mL/hr over 30 Minutes 08/12/19 0959   08/07/19 0200  remdesivir 200 mg in sodium chloride 0.9% 250 mL IVPB     200 mg 580 mL/hr over 30 Minutes 08/07/19 0341       Devices    LINES / TUBES:      Continuous Infusions:    Objective: Vitals:   08/29/19 0000 08/29/19 0400 08/29/19 0615 08/29/19 0757  BP: 113/87  (!) 122/94  121/84  Pulse: (!) 104 98 100 100  Resp: 13 16 16  (!) 21  Temp:  97.7 F (36.5 C)    TempSrc:  Axillary    SpO2: 92% 93% 92%   Weight:      Height:        Intake/Output Summary (Last 24 hours) at 08/29/2019 0801 Last data filed at 08/29/2019 0400 Gross per 24 hour  Intake 480 ml  Output 550 ml  Net -70 ml   Filed Weights   08/06/19 2031 08/21/19 0200  Weight: 105.2 kg 91.6 kg   Physical Exam:  General: A/O x4, positive acute respiratory distress, especially with any exertion Eyes: negative scleral hemorrhage, negative anisocoria, negative icterus ENT: Negative Runny nose, negative gingival bleeding, Neck:  Negative scars, masses, torticollis, lymphadenopathy, JVD Lungs: Tachypnea, clear to auscultation bilaterally without wheezes or crackles Cardiovascular: Tachycardic without murmur gallop or rub normal S1 and S2 Abdomen: negative abdominal pain, nondistended, positive soft, bowel sounds, no rebound, no ascites, no appreciable mass Extremities: No significant cyanosis, clubbing, or edema bilateral lower extremities Skin: Negative rashes, lesions, ulcers Psychiatric:  Negative depression, negative anxiety, negative fatigue, negative mania  Central nervous system:  Cranial nerves II through XII intact, tongue/uvula midline, all extremities muscle strength 5/5, sensation intact throughout, negative dysarthria, negative expressive aphasia, negative receptive aphasia. .     Data Reviewed: Care during the described time interval was provided by me .  I have reviewed this patient's available data, including medical history, events of note, physical examination, and all test results as part of my evaluation.   CBC: Recent Labs  Lab 08/26/19 0500 08/27/19 0500 08/27/19 1230 08/28/19 0430 08/29/19 0400  WBC 16.7* 14.6* 15.5* 15.6* 13.0*  NEUTROABS 14.6* 12.8* 14.3* 13.7* 11.8*  HGB 12.9* 12.9* 13.1 12.5* 12.1*  HCT 40.9 40.5 40.2 38.6* 37.5*  MCV 101.5* 100.7*  100.0 101.8* 101.6*  PLT 222 183 202 197 [id number]   Basic Metabolic Panel: Recent Labs  Lab 08/26/19 0500 08/27/19 0500 08/27/19 1230 08/28/19 0430 08/29/19 0400  NA  --   --  140 138 138  K  --   --  4.9 4.4 4.6  CL  --   --  100 98 99  CO2  --   --  31 31 32  GLUCOSE  --   --  218* 107* 178*  BUN  --   --  36* 33* 33*  CREATININE  --   --  0.70 0.83 0.81  CALCIUM  --   --  8.3* 8.3* 8.4*  MG 2.2 2.2 2.2 2.3 2.1  PHOS 3.3 3.6 3.5 3.3 3.8   GFR: Estimated Creatinine Clearance: 110.1 mL/min (by C-G formula based on SCr of 0.81 mg/dL). Liver Function Tests: Recent Labs  Lab 08/27/19 1230 08/28/19 0430 08/29/19 0400  AST 29 23 25   ALT 51* 45* 46*  ALKPHOS 91 83 82  BILITOT 1.4* 1.1 0.7  PROT 6.7 6.5 6.0*  ALBUMIN 2.8* 2.7* 2.7*   No results for input(s): LIPASE, AMYLASE in the last 168  hours. No results for input(s): AMMONIA in the last 168 hours. Coagulation Profile: No results for input(s): INR, PROTIME in the last 168 hours. Cardiac Enzymes: No results for input(s): CKTOTAL, CKMB, CKMBINDEX, TROPONINI in the last 168 hours. BNP (last 3 results) No results for input(s): PROBNP in the last 8760 hours. HbA1C: No results for input(s): HGBA1C in the last 72 hours. CBG: Recent Labs  Lab 08/28/19 0817 08/28/19 1146 08/28/19 1643 08/28/19 2008 08/28/19 2344  GLUCAP 135* 182* 118* 82 193*   Lipid Profile: No results for input(s): CHOL, HDL, LDLCALC, TRIG, CHOLHDL, LDLDIRECT in the last 72 hours. Thyroid Function Tests: No results for input(s): TSH, T4TOTAL, FREET4, T3FREE, THYROIDAB in the last 72 hours. Anemia Panel: Recent Labs    08/28/19 0430 08/29/19 0400  FERRITIN 611* 605*   Urine analysis:    Component Value Date/Time   COLORURINE YELLOW 08/04/2019 [place] 08/04/2019 1119   LABSPEC 1.021 08/04/2019 1119   PHURINE 5.0 08/04/2019 1119   GLUCOSEU NEGATIVE 08/04/2019 1119   HGBUR NEGATIVE 08/04/2019 1119   BILIRUBINUR NEGATIVE  08/04/2019 1119   KETONESUR 5 (A) 08/04/2019 1119   PROTEINUR 30 (A) 08/04/2019 1119   NITRITE NEGATIVE 08/04/2019 1119   LEUKOCYTESUR NEGATIVE 08/04/2019 1119   Sepsis Labs: @LABRCNTIP (procalcitonin:4,lacticidven:4)  ) Recent Results (from the past 240 hour(s))  MRSA PCR Screening     Status: None   Collection Time: 08/22/19  4:50 AM   Specimen: Nasal Mucosa; Nasopharyngeal  Result Value Ref Range Status   MRSA by PCR NEGATIVE NEGATIVE Final    Comment:        The GeneXpert MRSA Assay (FDA approved for NASAL specimens only), is one component of a comprehensive MRSA colonization surveillance program. It is not intended to diagnose MRSA infection nor to guide or monitor treatment for MRSA infections. Performed at [facility], [place] [address] [place], [place] [address]          Radiology Studies: DG CHEST PORT 1 VIEW  Result Date: 08/27/2019 CLINICAL DATA:  Worsening respiratory status, COVID-19, diabetes mellitus, hypertension EXAM: PORTABLE CHEST 1 VIEW COMPARISON:  Portable exam 1151 hours compared to 08/21/2019 FINDINGS: RIGHT jugular Port-A-Cath with tip projecting over cavoatrial junction. Upper normal heart size. Mediastinal contours and pulmonary vascularity normal. Scattered BILATERAL pulmonary infiltrates RIGHT greater than LEFT consistent with multifocal pneumonia, slightly improved in LEFT upper lobe. No pleural effusion or pneumothorax. IMPRESSION: BILATERAL pulmonary infiltrates consistent with multifocal pneumonia, slightly improved in LEFT upper lobe. Electronically Signed   By: Lavonia Dana M.D.   On: 08/27/2019 11:58        Scheduled Meds: . vitamin C  500 mg Oral BID  . aspirin EC  81 mg Oral QPC breakfast  . Chlorhexidine Gluconate Cloth  6 each Topical Daily  . cholecalciferol  2,000 Units Oral Daily  . enoxaparin (LOVENOX) injection  40 mg Subcutaneous Q24H  . feeding supplement (ENSURE ENLIVE)  237 mL Oral TID BM  . gabapentin   300 mg Oral QHS  . insulin aspart  0-20 Units Subcutaneous TID AC & HS  . insulin aspart  4 Units Subcutaneous TID WC  . insulin glargine  20 Units Subcutaneous QHS  . Ipratropium-Albuterol  1 puff Inhalation QID  . mouth rinse  15 mL Mouth Rinse BID  . methylPREDNISolone (SOLU-MEDROL) injection  20 mg Intravenous Q12H  . simvastatin  20 mg Oral QPM  . zinc sulfate  220 mg Oral Daily   Continuous Infusions:  LOS: 22 days   The patient is critically ill with multiple organ systems failure and requires high complexity decision making for assessment and support, frequent evaluation and titration of therapies, application of advanced monitoring technologies and extensive interpretation of multiple databases. Critical Care Time devoted to patient care services described in this note  Time spent: 40 minutes     Taela Charbonneau, Geraldo Docker, MD [facility] Pager [phone]  If 7PM-7AM, please contact night-coverage [email or web address] Password [facility] 08/29/2019, 8:01 AM

## 2019-08-29 NOTE — Progress Notes (Signed)
Notified MD concerning low glucose of 64. Pt on [place] with NRB mask and is drowsy, cannot tolerate oral fluids right now. Ordered for D50 given. No other issues at this time.

## 2019-08-29 NOTE — Progress Notes (Signed)
This RN has spoke with wife twice today, attempted to call at 1800 for update as she requested. Unable to reach her, left voicemail stating it was non urgent and she may call back anytime.   Speaking with patient his end goal is to get home on comfort to be with family. He is pleasant but c/o fatigue. MD aware and stated we are attempted to wean 02 as tolerated to get him to [facility] at minium so he may be with his wife. MD ok with 02 sat 85%  Marcie Bal RN Agency Nurse  [phone]

## 2019-08-30 LAB — PHOSPHORUS: Phosphorus: 3.3 mg/dL (ref 2.5–4.6)

## 2019-08-30 LAB — COMPREHENSIVE METABOLIC PANEL
ALT: 40 U/L (ref 0–44)
AST: 24 U/L (ref 15–41)
Albumin: 3 g/dL — ABNORMAL LOW (ref 3.5–5.0)
Alkaline Phosphatase: 83 U/L (ref 38–126)
Anion gap: 9 (ref 5–15)
BUN: 30 mg/dL — ABNORMAL HIGH (ref 8–23)
CO2: 31 mmol/L (ref 22–32)
Calcium: 8.8 mg/dL — ABNORMAL LOW (ref 8.9–10.3)
Chloride: 99 mmol/L (ref 98–111)
Creatinine, Ser: 0.75 mg/dL (ref 0.61–1.24)
GFR calc Af Amer: >60 mL/min (ref >60)
GFR calc non Af Amer: >60 mL/min (ref >60)
Glucose, Bld: 123 mg/dL — ABNORMAL HIGH (ref 70–99)
Potassium: 4.3 mmol/L (ref 3.5–5.1)
Sodium: 139 mmol/L (ref 135–145)
Total Bilirubin: 1.2 mg/dL (ref 0.3–1.2)
Total Protein: 6.5 g/dL (ref 6.5–8.1)

## 2019-08-30 LAB — CBC WITH DIFFERENTIAL/PLATELET
Abs Immature Granulocytes: 0.04 10*3/uL (ref 0.00–0.07)
Basophils Absolute: 0 10*3/uL (ref 0.0–0.1)
Basophils Relative: 0 %
Eosinophils Absolute: 0.1 10*3/uL (ref 0.0–0.5)
Eosinophils Relative: 1 %
HCT: 38.7 % — ABNORMAL LOW (ref 39.0–52.0)
Hemoglobin: 12.1 g/dL — ABNORMAL LOW (ref 13.0–17.0)
Immature Granulocytes: 0 %
Lymphocytes Relative: 5 %
Lymphs Abs: 0.6 10*3/uL — ABNORMAL LOW (ref 0.7–4.0)
MCH: 31.7 pg (ref 26.0–34.0)
MCHC: 31.3 g/dL (ref 30.0–36.0)
MCV: 101.3 fL — ABNORMAL HIGH (ref 80.0–100.0)
Monocytes Absolute: 0.6 10*3/uL (ref 0.1–1.0)
Monocytes Relative: 5 %
Neutro Abs: 11.2 10*3/uL — ABNORMAL HIGH (ref 1.7–7.7)
Neutrophils Relative %: 89 %
Platelets: 178 10*3/uL (ref 150–400)
RBC: 3.82 MIL/uL — ABNORMAL LOW (ref 4.22–5.81)
RDW: 14.8 % (ref 11.5–15.5)
WBC: 12.5 10*3/uL — ABNORMAL HIGH (ref 4.0–10.5)
nRBC: 0 % (ref 0.0–0.2)

## 2019-08-30 LAB — C-REACTIVE PROTEIN: CRP: 0.7 mg/dL (ref <1.0)

## 2019-08-30 LAB — D-DIMER, QUANTITATIVE: D-Dimer, Quant: 2.84 ug/mL-FEU — ABNORMAL HIGH (ref 0.00–0.50)

## 2019-08-30 LAB — GLUCOSE, CAPILLARY
Glucose-Capillary: 137 mg/dL — ABNORMAL HIGH (ref 70–99)
Glucose-Capillary: 199 mg/dL — ABNORMAL HIGH (ref 70–99)
Glucose-Capillary: 252 mg/dL — ABNORMAL HIGH (ref 70–99)
Glucose-Capillary: 175 mg/dL — ABNORMAL HIGH (ref 70–99)
Glucose-Capillary: 181 mg/dL — ABNORMAL HIGH (ref 70–99)

## 2019-08-30 LAB — FERRITIN: Ferritin: 655 ng/mL — ABNORMAL HIGH (ref 24–336)

## 2019-08-30 LAB — MAGNESIUM: Magnesium: 2.2 mg/dL (ref 1.7–2.4)

## 2019-08-30 MED ORDER — METHYLPREDNISOLONE SODIUM SUCC 125 MG IJ SOLR
60.0000 mg | Freq: Three times a day (TID) | INTRAMUSCULAR | Status: DC
  Administered 2019-08-30 – 2019-09-01 (×6): 60 mg via INTRAVENOUS
  Filled 2019-08-30 (×6): qty 2

## 2019-08-30 NOTE — Progress Notes (Signed)
PROGRESS NOTE    Vincent [name]  [id number] DOB: September 02, 1950 DOA: 08/06/2019 PCP: Leonard Downing, MD   Brief Narrative:  [phone] WM PMHx diabetes type 2 controlled with complication, HTN, and metastatic pancreatic CA on chemo   Presented to the [facility] ED w/ weakness and a cough for 5 days. In the ED he was found to be COVID+, febrile to 101.8, and hypoxic with sats of 80% on RA requirin 8L of support to improve. A CXR was w/o clear infiltrate but CTa noted diffuse B infiltrates c/w Covid   Subjective: 1/23 afebrile overnight.  Patient much more comfortable today after recovering overnight.     Assessment & Plan:   Principal Problem:   Pneumonia due to COVID-19 virus Active Problems:   Pancreatic cancer ([place])   Acute respiratory failure with hypoxia ([place])   Type 2 diabetes mellitus (HCC)   HLD (hyperlipidemia)   Covid pneumonia/acute respiratory failure with hypoxia COVID-19 Labs  Recent Labs    08/28/19 0430 08/29/19 0400 08/30/19 0355  DDIMER 2.37* 2.41* 2.84*  FERRITIN 611* 605* 655*  CRP 1.3* 0.9 0.7    12/30 POC SARS coronavirus positive  -NO Actemra as pt on chemotherapy  -Decadron 6 mg daily completed -Remdesivir per pharmacy protocol -1/23 increase Solu-Medrol 60 mg TID.  Do not change -Counseled patient concerning Covid convalescent plasma explained not proven therapy by randomized control trial.  Counseled that CCP is considered a treatment option for patients with severe disease.  Patient has agreed that we may use CCP if required -Vitamins per Covid protocol -Prone patient 16 hours/day.  If patient cannot tolerate prone 2 to 3 hours per shift -Titrate O2 to maintain SPO2> 88% -Vitamins per Covid protocol -Flutter valve -Incentive spirometry -Combivent -1/20 PCXR; slightly improved pneumonia LUL see results below -1/23 discussed at length with RN, and RT Collie Siad that if we can get patient titrated down to a level which [facility] will accept for  transport this was our goal.  Allow patient to be a happy hypoxic. -1/23 have contacted flow management requested immediate bed for transport to [facility].  Diabetes type 2 controlled with complication -[id number] hemoglobin A1c= 6.6 -1/23 increase Lantus 25 units qhs  -NovoLog 4 units qac -Resistant SSI  HLD -Simvastatin 20 mg daily -1/2 LDL= 66  Metastatic prostate cancer -Followed by Dr. Burr Medico -Dx March 2020, on chemotherapy initially FOLFIRINOX, DC'ed oxaliplatin due to neuropathy and thrombocytopenia.  -1/4 most recently received promacta for marrow stimulation, though this was stopped due to thrombosis risk.  - Patient's oncologist is aware of admission. Continue to delay chemotherapy at this time.  Anemia of malignancy:  Recent Labs  Lab 08/27/19 0500 08/27/19 1230 08/28/19 0430 08/29/19 0400 08/30/19 0355  HGB 12.9* 13.1 12.5* 12.1* 12.1*  -Stable, no evidence of blood loss.  -Continue monitoring.   Goals of care -1/23 I spoke at length with patient as well as family counseled that he has received maximum treatment for Covid.  Unfortunately does not appear as if he is going to improve, and barring a miraculous recovery most likely will not survive this hospitalization.  Patient would like to explore residential hospice if possible; now understands impossible to go home given the large amount of oxygen currently required. -1/23 consult Palliative Care placed.     DVT prophylaxis: Lovenox Code Status: DNR Family Communication: 1/23 contacted Almyra Free (wife) to update her on plan of care, answered all questions. Disposition Plan: TBD   Consultants:    Procedures/Significant Events:  12/31 admit via [facility] ED - transfer to [facility] 1/20 PCXR;-BILATERAL pulmonary infiltrates consistent with multifocal pneumonia, slightly improved in LEFT upper lobe.   I have personally reviewed and interpreted all radiology studies and my findings are as above.  VENTILATOR SETTINGS: [place]  HFNC+ NRB flow; 15 L/min SPO2; 86%   Cultures 12/30 POC SARS coronavirus positive 12/31 HIV screen negative   Antimicrobials: Anti-infectives (From admission, onward)   Start     Dose/Rate Stop   08/08/19 1000  remdesivir 100 mg in sodium chloride 0.9 % 100 mL IVPB     100 mg 200 mL/hr over 30 Minutes 08/12/19 0959   08/07/19 0200  remdesivir 200 mg in sodium chloride 0.9% 250 mL IVPB     200 mg 580 mL/hr over 30 Minutes 08/07/19 0341       Devices    LINES / TUBES:      Continuous Infusions:    Objective: Vitals:   08/30/19 0329 08/30/19 0400 08/30/19 0412 08/30/19 0800  BP:  114/80  109/88  Pulse: (!) 108 (!) 108 (!) 110 (!) 111  Resp: (!) 31 (!) 30 (!) 22 (!) 25  Temp:  98.9 F (37.2 C)  98.5 F (36.9 C)  TempSrc:  Axillary  Axillary  SpO2: 92% 92% 93% 91%  Weight:      Height:        Intake/Output Summary (Last 24 hours) at 08/30/2019 1117 Last data filed at 08/30/2019 0400 Gross per 24 hour  Intake 100 ml  Output 570 ml  Net -470 ml   Filed Weights   08/06/19 2031 08/21/19 0200  Weight: 105.2 kg 91.6 kg    Physical Exam:  General: A/O x4, positive acute respiratory distress Eyes: negative scleral hemorrhage, negative anisocoria, negative icterus ENT: Negative Runny nose, negative gingival bleeding, Neck:  Negative scars, masses, torticollis, lymphadenopathy, JVD Lungs: Tachypneic clear to auscultation bilaterally without wheezes or crackles Cardiovascular: Tachycardic without murmur gallop or rub normal S1 and S2 Abdomen: negative abdominal pain, nondistended, positive soft, bowel sounds, no rebound, no ascites, no appreciable mass Extremities: No significant cyanosis, clubbing, or edema bilateral lower extremities Skin: Negative rashes, lesions, ulcers Psychiatric:  Negative depression, negative anxiety, negative fatigue, negative mania  Central nervous system:  Cranial nerves II through XII intact, tongue/uvula midline, all extremities  muscle strength 5/5, sensation intact throughout, negative dysarthria, negative expressive aphasia, negative receptive aphasia.      Data Reviewed: Care during the described time interval was provided by me .  I have reviewed this patient's available data, including medical history, events of note, physical examination, and all test results as part of my evaluation.   CBC: Recent Labs  Lab 08/27/19 0500 08/27/19 1230 08/28/19 0430 08/29/19 0400 08/30/19 0355  WBC 14.6* 15.5* 15.6* 13.0* 12.5*  NEUTROABS 12.8* 14.3* 13.7* 11.8* 11.2*  HGB 12.9* 13.1 12.5* 12.1* 12.1*  HCT 40.5 40.2 38.6* 37.5* 38.7*  MCV 100.7* 100.0 101.8* 101.6* 101.3*  PLT 183 202 197 177 0000000   Basic Metabolic Panel: Recent Labs  Lab 08/27/19 0500 08/27/19 1230 08/28/19 0430 08/29/19 0400 08/30/19 0355  NA  --  140 138 138 139  K  --  4.9 4.4 4.6 4.3  CL  --  100 98 99 99  CO2  --  31 31 32 31  GLUCOSE  --  218* 107* 178* 123*  BUN  --  36* 33* 33* 30*  CREATININE  --  0.70 0.83 0.81 0.75  CALCIUM  --  8.3* 8.3* 8.4* 8.8*  MG 2.2 2.2 2.3 2.1 2.2  PHOS 3.6 3.5 3.3 3.8 3.3   GFR: Estimated Creatinine Clearance: 111.5 mL/min (by C-G formula based on SCr of 0.75 mg/dL). Liver Function Tests: Recent Labs  Lab 08/27/19 1230 08/28/19 0430 08/29/19 0400 08/30/19 0355  AST 29 23 25 24   ALT 51* 45* 46* 40  ALKPHOS 91 83 82 83  BILITOT 1.4* 1.1 0.7 1.2  PROT 6.7 6.5 6.0* 6.5  ALBUMIN 2.8* 2.7* 2.7* 3.0*   No results for input(s): LIPASE, AMYLASE in the last 168 hours. No results for input(s): AMMONIA in the last 168 hours. Coagulation Profile: No results for input(s): INR, PROTIME in the last 168 hours. Cardiac Enzymes: No results for input(s): CKTOTAL, CKMB, CKMBINDEX, TROPONINI in the last 168 hours. BNP (last 3 results) No results for input(s): PROBNP in the last 8760 hours. HbA1C: No results for input(s): HGBA1C in the last 72 hours. CBG: Recent Labs  Lab 08/29/19 1917 08/29/19 2052  08/29/19 2342 08/30/19 0355 08/30/19 0839  GLUCAP 64* 187* 130* 137* 199*   Lipid Profile: No results for input(s): CHOL, HDL, LDLCALC, TRIG, CHOLHDL, LDLDIRECT in the last 72 hours. Thyroid Function Tests: No results for input(s): TSH, T4TOTAL, FREET4, T3FREE, THYROIDAB in the last 72 hours. Anemia Panel: Recent Labs    08/29/19 0400 08/30/19 0355  FERRITIN 605* 655*   Urine analysis:    Component Value Date/Time   COLORURINE YELLOW 08/04/2019 [place] 08/04/2019 1119   LABSPEC 1.021 08/04/2019 1119   PHURINE 5.0 08/04/2019 1119   GLUCOSEU NEGATIVE 08/04/2019 1119   HGBUR NEGATIVE 08/04/2019 1119   BILIRUBINUR NEGATIVE 08/04/2019 1119   KETONESUR 5 (A) 08/04/2019 1119   PROTEINUR 30 (A) 08/04/2019 1119   NITRITE NEGATIVE 08/04/2019 1119   LEUKOCYTESUR NEGATIVE 08/04/2019 1119   Sepsis Labs: @LABRCNTIP (procalcitonin:4,lacticidven:4)  ) Recent Results (from the past 240 hour(s))  MRSA PCR Screening     Status: None   Collection Time: 08/22/19  4:50 AM   Specimen: Nasal Mucosa; Nasopharyngeal  Result Value Ref Range Status   MRSA by PCR NEGATIVE NEGATIVE Final    Comment:        The GeneXpert MRSA Assay (FDA approved for NASAL specimens only), is one component of a comprehensive MRSA colonization surveillance program. It is not intended to diagnose MRSA infection nor to guide or monitor treatment for MRSA infections. Performed at [facility], [place] [address], [place], [place] [address]          Radiology Studies: No results found.      Scheduled Meds: . vitamin C  500 mg Oral BID  . aspirin EC  81 mg Oral QPC breakfast  . Chlorhexidine Gluconate Cloth  6 each Topical Daily  . cholecalciferol  2,000 Units Oral Daily  . enoxaparin (LOVENOX) injection  40 mg Subcutaneous Q24H  . feeding supplement (ENSURE ENLIVE)  237 mL Oral TID BM  . gabapentin  300 mg Oral QHS  . insulin aspart  0-20 Units Subcutaneous TID AC  & HS  . insulin aspart  4 Units Subcutaneous TID WC  . insulin glargine  20 Units Subcutaneous QHS  . Ipratropium-Albuterol  1 puff Inhalation QID  . mouth rinse  15 mL Mouth Rinse BID  . methylPREDNISolone (SOLU-MEDROL) injection  20 mg Intravenous Q12H  . simvastatin  20 mg Oral QPM  . zinc sulfate  220 mg Oral Daily   Continuous Infusions:    LOS: 23  days   The patient is critically ill with multiple organ systems failure and requires high complexity decision making for assessment and support, frequent evaluation and titration of therapies, application of advanced monitoring technologies and extensive interpretation of multiple databases. Critical Care Time devoted to patient care services described in this note  Time spent: 40 minutes     Tamarius Rosenfield, Geraldo Docker, MD [facility] Pager [phone]  If 7PM-7AM, please contact night-coverage [email or web address] Password [facility] 08/30/2019, 11:17 AM

## 2019-08-30 NOTE — Progress Notes (Signed)
Dr. Sherral Hammers called [facility] for transport to [facility] 2W.  Primary RN to call report.

## 2019-08-30 NOTE — Progress Notes (Signed)
Palliative consult received -case discussed with Dr. Sherral Hammers. Patient has had 23 days of treatment for severe COVID-19 infection but continues to need high flow O2 and has pancreatic cancer with anticipated terminal trajectory. He continues to have high O2 requirements- and plan is to discuss hospice options.I have attempted to call his wife X2 today but number goes to VM. Discussed case with [place] liaison -home w hospice or hospice facility are options.  Will continue attempts to reach his wife and discuss possible options for hospice care.  Lane Hacker, DO Palliative Medicine [phone]

## 2019-08-30 NOTE — Progress Notes (Signed)
RT called to patient's room to access trying to turn down the patient's oxygen requirements so he may be able to transfer to [facility].  Patient tolerated Heated HFNC flow dropped to 20 without increased WOB.  RT placed patient on NRB and [place] HFNC 15 L each.  Patient's sats are 94% and tolerating well at this time.  RN at bedside.

## 2019-08-30 NOTE — Progress Notes (Addendum)
RN attempted to call report to 2West at [facility], patient going to room 238. RN told by Dossie Arbour to call back at shift change.  1907 RN attempted again to call report to nurse receiving patient to room 238. RN left charge phone number to call back for report when able.

## 2019-08-31 LAB — COMPREHENSIVE METABOLIC PANEL
ALT: 39 U/L (ref 0–44)
AST: 25 U/L (ref 15–41)
Albumin: 2.9 g/dL — ABNORMAL LOW (ref 3.5–5.0)
Alkaline Phosphatase: 85 U/L (ref 38–126)
Anion gap: 9 (ref 5–15)
BUN: 31 mg/dL — ABNORMAL HIGH (ref 8–23)
CO2: 32 mmol/L (ref 22–32)
Calcium: 8.7 mg/dL — ABNORMAL LOW (ref 8.9–10.3)
Chloride: 99 mmol/L (ref 98–111)
Creatinine, Ser: 0.69 mg/dL (ref 0.61–1.24)
GFR calc Af Amer: >60 mL/min (ref >60)
GFR calc non Af Amer: >60 mL/min (ref >60)
Glucose, Bld: 152 mg/dL — ABNORMAL HIGH (ref 70–99)
Potassium: 4.4 mmol/L (ref 3.5–5.1)
Sodium: 140 mmol/L (ref 135–145)
Total Bilirubin: 1.1 mg/dL (ref 0.3–1.2)
Total Protein: 6.3 g/dL — ABNORMAL LOW (ref 6.5–8.1)

## 2019-08-31 LAB — CBC WITH DIFFERENTIAL/PLATELET
Abs Immature Granulocytes: 0.13 10*3/uL — ABNORMAL HIGH (ref 0.00–0.07)
Basophils Absolute: 0 10*3/uL (ref 0.0–0.1)
Basophils Relative: 0 %
Eosinophils Absolute: 0 10*3/uL (ref 0.0–0.5)
Eosinophils Relative: 0 %
HCT: 39.5 % (ref 39.0–52.0)
Hemoglobin: 12.6 g/dL — ABNORMAL LOW (ref 13.0–17.0)
Immature Granulocytes: 1 %
Lymphocytes Relative: 2 %
Lymphs Abs: 0.3 10*3/uL — ABNORMAL LOW (ref 0.7–4.0)
MCH: 32 pg (ref 26.0–34.0)
MCHC: 31.9 g/dL (ref 30.0–36.0)
MCV: 100.3 fL — ABNORMAL HIGH (ref 80.0–100.0)
Monocytes Absolute: 0.4 10*3/uL (ref 0.1–1.0)
Monocytes Relative: 2 %
Neutro Abs: 17.6 10*3/uL — ABNORMAL HIGH (ref 1.7–7.7)
Neutrophils Relative %: 95 %
Platelets: 200 10*3/uL (ref 150–400)
RBC: 3.94 MIL/uL — ABNORMAL LOW (ref 4.22–5.81)
RDW: 14.9 % (ref 11.5–15.5)
WBC: 18.5 10*3/uL — ABNORMAL HIGH (ref 4.0–10.5)
nRBC: 0 % (ref 0.0–0.2)

## 2019-08-31 LAB — PHOSPHORUS: Phosphorus: 2.9 mg/dL (ref 2.5–4.6)

## 2019-08-31 LAB — GLUCOSE, CAPILLARY
Glucose-Capillary: 218 mg/dL — ABNORMAL HIGH (ref 70–99)
Glucose-Capillary: 179 mg/dL — ABNORMAL HIGH (ref 70–99)
Glucose-Capillary: 171 mg/dL — ABNORMAL HIGH (ref 70–99)
Glucose-Capillary: 216 mg/dL — ABNORMAL HIGH (ref 70–99)

## 2019-08-31 LAB — C-REACTIVE PROTEIN: CRP: 0.8 mg/dL (ref <1.0)

## 2019-08-31 LAB — FERRITIN: Ferritin: 601 ng/mL — ABNORMAL HIGH (ref 24–336)

## 2019-08-31 LAB — MAGNESIUM: Magnesium: 2.3 mg/dL (ref 1.7–2.4)

## 2019-08-31 LAB — D-DIMER, QUANTITATIVE: D-Dimer, Quant: 2.6 ug/mL-FEU — ABNORMAL HIGH (ref 0.00–0.50)

## 2019-08-31 MED ORDER — SODIUM CHLORIDE 0.9 % IV BOLUS
250.0000 mL | Freq: Once | INTRAVENOUS | Status: AC
  Administered 2019-08-31: 250 mL via INTRAVENOUS

## 2019-08-31 NOTE — Progress Notes (Signed)
PROGRESS NOTE    Vincent [name]  [id number] DOB: 1951-07-08 DOA: 08/06/2019   PCP: Leonard Downing, MD   Brief Narrative: [phone] WM PMHx diabetes type 2 controlled with complication, HTN,andmetastatic pancreatic CA on chemo  who presented to the [facility] ED w/ weakness and a cough for 5 days. In the ED he was found to be COVID+, febrile to 101.8, and hypoxic with sats of 80% on RA requirin 8L of support to improve. A CXR was w/o clear infiltrate but CT noted diffuse B infiltrates c/w Covid  Assessment & Plan:   Principal Problem:   Pneumonia due to COVID-19 virus Active Problems:   Pancreatic cancer ([place])   Acute respiratory failure with hypoxia (HCC)   Type 2 diabetes mellitus (HCC)   HLD (hyperlipidemia)   # Covid pneumonia / Acute respiratory failure with hypoxia  12/30 POC SARS coronavirus positive -NO Actemra as pt is on chemotherapy -Decadron 6 mg daily completed -Remdesivir per pharmacy protocol -1/23 increase Solu-Medrol 60 mg TID.  Do not change -Counseled patient concerning Covid convalescent plasma explained not proven therapy by randomized control trial.  Counseled that CCP is considered a treatment option for patients with severe disease.  Patient has agreed that we may use CCP if required -Vitamins per Covid protocol -Prone patient 16 hours/day.  If patient cannot tolerate, prone 2 to 3 hours per shift -Titrate O2 to maintain SPO2> 88% -Vitamins per Covid protocol -Flutter valve -Incentive spirometry -Combivent -1/20 PCXR; slightly improved pneumonia LUL see results below -1/23 discussed at length with RN, and RT   # Diabetes type 2 controlled with complication -Q000111Q hemoglobin A1c= 6.6 -1/23 increase Lantus 25 units qhs  -NovoLog 4 units qac -Resistant SSI  # HLD -Simvastatin 20 mg daily -1/2 LDL= 66  # Metastatic prostate cancer -Followed by Dr. Burr Medico -Dx March 2020, on chemotherapy initially FOLFIRINOX, DC'ed oxaliplatin due to  neuropathy and thrombocytopenia.  -1/4 most recently received promacta for marrow stimulation, though this was stopped due to thrombosis risk.  - Patient's oncologist is aware of admission. Continue to delay chemotherapy at this time.  # Goals of care -1/23 Dr. Sherral Hammers spoke at length with patient as well as family counseled that he has received maximum treatment for Covid.  Unfortunately does not appear as if he is going to improve, and barring a miraculous recovery most likely will not survive this hospitalization.  Patient would like to explore residential hospice if possible; now understands impossible to go home given the large amount of oxygen currently required. -1/23 consult Palliative Care placed.    DVT prophylaxis: Lovenox Code Status:  DNR Family Communication: 1/23 Dr. Sherral Hammers contacted Almyra Free (wife) to update her on plan of care, answered all questions. Disposition Plan:  TBD   Consultants:    Procedures:  12/31 admit via [facility] ED - transfer to [facility] 1/20 PCXR;-BILATERAL pulmonary infiltrates consistent with multifocal pneumonia, slightly improved in LEFT upper lobe.  Antimicrobials:  Anti-infectives (From admission, onward)   Start     Dose/Rate Route Frequency Ordered Stop   08/16/19 2300  vancomycin (VANCOREADY) IVPB 1750 mg/350 mL     1,750 mg 175 mL/hr over 120 Minutes Intravenous Every 12 hours 08/16/19 1011 08/20/19 1400   08/16/19 1100  vancomycin (VANCOREADY) IVPB 2000 mg/400 mL     2,000 mg 200 mL/hr over 120 Minutes Intravenous  Once 08/16/19 0953 08/16/19 1400   08/16/19 1030  ceFEPIme (MAXIPIME) 2 g in sodium chloride 0.9 % 100 mL IVPB  2 g 200 mL/hr over 30 Minutes Intravenous Every 8 hours 08/16/19 0953 08/20/19 1745   08/08/19 1000  remdesivir 100 mg in sodium chloride 0.9 % 100 mL IVPB     100 mg 200 mL/hr over 30 Minutes Intravenous Daily 08/07/19 0151 08/11/19 0855   08/07/19 0200  remdesivir 200 mg in sodium chloride 0.9% 250 mL IVPB     200 mg 580  mL/hr over 30 Minutes Intravenous Once 08/07/19 0151 08/07/19 0341      Subjective: Patient was seen at bedside, He is on nonrebreather and on high flow oxygen, saturation is 92%.  He is alert,  Awake and responds appropriately.  Objective: Vitals:   08/31/19 1016 08/31/19 1025 08/31/19 1045 08/31/19 1206  BP:   106/88 100/88  Pulse:   (!) 114 (!) 114  Resp:   (!) 25 20  Temp:    98.3 F (36.8 C)  TempSrc:    Axillary  SpO2: 90% 92% 92% 93%  Weight:      Height:        Intake/Output Summary (Last 24 hours) at 08/31/2019 1457 Last data filed at 08/31/2019 1054 Gross per 24 hour  Intake --  Output 525 ml  Net -525 ml   Filed Weights   08/06/19 2031 08/21/19 0200  Weight: 105.2 kg 91.6 kg    Examination:  General exam: Appears calm but appears in respiratory distress. Respiratory system: Clear to auscultation. Respiratory effort normal. Cardiovascular system: S1 & S2 heard, RRR. No JVD, murmurs, rubs, gallops or clicks. No pedal edema. Gastrointestinal system: Abdomen is nondistended, soft and nontender. No organomegaly or masses felt. Normal bowel sounds heard. Central nervous system: Alert and oriented. No focal neurological deficits. Extremities: No edema, no leg swelling Skin: No rashes, lesions or ulcers Psychiatry: Judgement and insight appear normal. Mood & affect appropriate.   Data Reviewed: I have personally reviewed following labs and imaging studies  CBC: Recent Labs  Lab 08/27/19 1230 08/28/19 0430 08/29/19 0400 08/30/19 0355 08/31/19 0152  WBC 15.5* 15.6* 13.0* 12.5* 18.5*  NEUTROABS 14.3* 13.7* 11.8* 11.2* 17.6*  HGB 13.1 12.5* 12.1* 12.1* 12.6*  HCT 40.2 38.6* 37.5* 38.7* 39.5  MCV 100.0 101.8* 101.6* 101.3* 100.3*  PLT 202 197 177 178 [id number]   Basic Metabolic Panel: Recent Labs  Lab 08/27/19 1230 08/28/19 0430 08/29/19 0400 08/30/19 0355 08/31/19 0152  NA 140 138 138 139 140  K 4.9 4.4 4.6 4.3 4.4  CL 100 98 99 99 99  CO2 31 31 32 31 32   GLUCOSE 218* 107* 178* 123* 152*  BUN 36* 33* 33* 30* 31*  CREATININE 0.70 0.83 0.81 0.75 0.69  CALCIUM 8.3* 8.3* 8.4* 8.8* 8.7*  MG 2.2 2.3 2.1 2.2 2.3  PHOS 3.5 3.3 3.8 3.3 2.9   GFR: Estimated Creatinine Clearance: 111.5 mL/min (by C-G formula based on SCr of 0.69 mg/dL). Liver Function Tests: Recent Labs  Lab 08/27/19 1230 08/28/19 0430 08/29/19 0400 08/30/19 0355 08/31/19 0152  AST 29 23 25 24 25   ALT 51* 45* 46* 40 39  ALKPHOS 91 83 82 83 85  BILITOT 1.4* 1.1 0.7 1.2 1.1  PROT 6.7 6.5 6.0* 6.5 6.3*  ALBUMIN 2.8* 2.7* 2.7* 3.0* 2.9*   No results for input(s): LIPASE, AMYLASE in the last 168 hours. No results for input(s): AMMONIA in the last 168 hours. Coagulation Profile: No results for input(s): INR, PROTIME in the last 168 hours. Cardiac Enzymes: No results for input(s): CKTOTAL, CKMB, CKMBINDEX, TROPONINI in  the last 168 hours. BNP (last 3 results) No results for input(s): PROBNP in the last 8760 hours. HbA1C: No results for input(s): HGBA1C in the last 72 hours. CBG: Recent Labs  Lab 08/30/19 1159 08/30/19 1733 08/30/19 1921 08/30/19 2143 08/31/19 0736  GLUCAP 252* 175* 218* 181* 179*   Lipid Profile: No results for input(s): CHOL, HDL, LDLCALC, TRIG, CHOLHDL, LDLDIRECT in the last 72 hours. Thyroid Function Tests: No results for input(s): TSH, T4TOTAL, FREET4, T3FREE, THYROIDAB in the last 72 hours. Anemia Panel: Recent Labs    08/30/19 0355 08/31/19 0152  FERRITIN 655* 601*   Sepsis Labs: No results for input(s): PROCALCITON, LATICACIDVEN in the last 168 hours.  Recent Results (from the past 240 hour(s))  MRSA PCR Screening     Status: None   Collection Time: 08/22/19  4:50 AM   Specimen: Nasal Mucosa; Nasopharyngeal  Result Value Ref Range Status   MRSA by PCR NEGATIVE NEGATIVE Final    Comment:        The GeneXpert MRSA Assay (FDA approved for NASAL specimens only), is one component of a comprehensive MRSA colonization surveillance  program. It is not intended to diagnose MRSA infection nor to guide or monitor treatment for MRSA infections. Performed at [facility], [place] [address], [place],  [address]      Radiology Studies: No results found.   Scheduled Meds: . vitamin C  500 mg Oral BID  . aspirin EC  81 mg Oral QPC breakfast  . Chlorhexidine Gluconate Cloth  6 each Topical Daily  . cholecalciferol  2,000 Units Oral Daily  . enoxaparin (LOVENOX) injection  40 mg Subcutaneous Q24H  . feeding supplement (ENSURE ENLIVE)  237 mL Oral TID BM  . gabapentin  300 mg Oral QHS  . insulin aspart  0-20 Units Subcutaneous TID AC & HS  . insulin aspart  4 Units Subcutaneous TID WC  . insulin glargine  20 Units Subcutaneous QHS  . Ipratropium-Albuterol  1 puff Inhalation QID  . mouth rinse  15 mL Mouth Rinse BID  . methylPREDNISolone (SOLU-MEDROL) injection  60 mg Intravenous TID  . simvastatin  20 mg Oral QPM  . zinc sulfate  220 mg Oral Daily   Continuous Infusions:   LOS: 24 days    Time spent: 25 mins   Shawna Clamp, MD [facility]   If 7PM-7AM, please contact night-coverage

## 2019-08-31 NOTE — Progress Notes (Signed)
Patient arrived to 2west. Patient transferred via [place] on NRB. Patient's saturations were mid 50s when connected to progressive monitor. Pt is alert and oriented. Pt then placed on High Flow [place] 15L with NRB at 15L. Patient reported some anxiousness and SOB. Charge RN Josh aware and assisted in patient admission. Patient current saturations are 90s and above. He reports to be breathing better now at this time. Pt reports no pain.  Admissions paged to alert of patient's arrival to the unit and new MD assigned to patient. MD Madaline Brilliant paged and notified about patient's arrival  to the unit and current status.

## 2019-08-31 NOTE — Progress Notes (Signed)
I called 2W at [facility] and gave report to [facility] on this patient. The patient was transferred via [facility]link and was given a CHG bath and a fresh gown per protocol. Transferred at [facility]. I will attempt to call the patient's family to let them know the patient's transfer status.

## 2019-08-31 NOTE — Progress Notes (Signed)
[place] with [facility]. Will not be taking him to [facility].

## 2019-08-31 NOTE — Progress Notes (Signed)
Spoke with the wife Almyra Free on the patient's phone on Facetime, Wife was updated about patient status and voiced some concerns. Wife was given update about current medications the patient is taking and about care plan. Wife voiced understanding and desire to visit the patient. Covid visitation policy explained to wife and she understanding currently that the patient cannot have visitors at this time.   Son Leroy Sea was then facetime at patient's request. Patient was able to speak with son for a few minutes.

## 2019-09-01 ENCOUNTER — Telehealth: Payer: Self-pay

## 2019-09-01 LAB — COMPREHENSIVE METABOLIC PANEL
ALT: 37 U/L (ref 0–44)
AST: 22 U/L (ref 15–41)
Albumin: 2.7 g/dL — ABNORMAL LOW (ref 3.5–5.0)
Alkaline Phosphatase: 79 U/L (ref 38–126)
Anion gap: 11 (ref 5–15)
BUN: 31 mg/dL — ABNORMAL HIGH (ref 8–23)
CO2: 31 mmol/L (ref 22–32)
Calcium: 8.5 mg/dL — ABNORMAL LOW (ref 8.9–10.3)
Chloride: 99 mmol/L (ref 98–111)
Creatinine, Ser: 0.78 mg/dL (ref 0.61–1.24)
GFR calc Af Amer: >60 mL/min (ref >60)
GFR calc non Af Amer: >60 mL/min (ref >60)
Glucose, Bld: 175 mg/dL — ABNORMAL HIGH (ref 70–99)
Potassium: 4.4 mmol/L (ref 3.5–5.1)
Sodium: 141 mmol/L (ref 135–145)
Total Bilirubin: 0.7 mg/dL (ref 0.3–1.2)
Total Protein: 5.8 g/dL — ABNORMAL LOW (ref 6.5–8.1)

## 2019-09-01 LAB — RESPIRATORY PANEL BY RT PCR (FLU A&B, COVID)
Influenza A by PCR: NEGATIVE
Influenza B by PCR: NEGATIVE
SARS Coronavirus 2 by RT PCR: POSITIVE — AB

## 2019-09-01 LAB — CBC
HCT: 38.6 % — ABNORMAL LOW (ref 39.0–52.0)
Hemoglobin: 12.2 g/dL — ABNORMAL LOW (ref 13.0–17.0)
MCH: 32.1 pg (ref 26.0–34.0)
MCHC: 31.6 g/dL (ref 30.0–36.0)
MCV: 101.6 fL — ABNORMAL HIGH (ref 80.0–100.0)
Platelets: 156 10*3/uL (ref 150–400)
RBC: 3.8 MIL/uL — ABNORMAL LOW (ref 4.22–5.81)
RDW: 15.5 % (ref 11.5–15.5)
WBC: 14.8 10*3/uL — ABNORMAL HIGH (ref 4.0–10.5)
nRBC: 0 % (ref 0.0–0.2)

## 2019-09-01 LAB — PHOSPHORUS: Phosphorus: 4.1 mg/dL (ref 2.5–4.6)

## 2019-09-01 LAB — GLUCOSE, CAPILLARY
Glucose-Capillary: 167 mg/dL — ABNORMAL HIGH (ref 70–99)
Glucose-Capillary: 168 mg/dL — ABNORMAL HIGH (ref 70–99)
Glucose-Capillary: 153 mg/dL — ABNORMAL HIGH (ref 70–99)
Glucose-Capillary: 145 mg/dL — ABNORMAL HIGH (ref 70–99)
Glucose-Capillary: 136 mg/dL — ABNORMAL HIGH (ref 70–99)

## 2019-09-01 LAB — D-DIMER, QUANTITATIVE: D-Dimer, Quant: 2.6 ug/mL-FEU — ABNORMAL HIGH (ref 0.00–0.50)

## 2019-09-01 LAB — C-REACTIVE PROTEIN: CRP: 0.8 mg/dL (ref <1.0)

## 2019-09-01 LAB — MAGNESIUM: Magnesium: 2.3 mg/dL (ref 1.7–2.4)

## 2019-09-01 MED ORDER — METHYLPREDNISOLONE SODIUM SUCC 40 MG IJ SOLR
40.0000 mg | Freq: Two times a day (BID) | INTRAMUSCULAR | Status: DC
  Administered 2019-09-01 – 2019-09-02 (×2): 40 mg via INTRAVENOUS
  Filled 2019-09-01 (×2): qty 1

## 2019-09-01 NOTE — Telephone Encounter (Signed)
Vincent. Vincent [name] called requesting a callback.  Per 1/23 note patient is not expected to recover from Covid infection.  Physicians have spoken to Vincent and Vincent [name] regarding his prognosis.

## 2019-09-01 NOTE — Progress Notes (Signed)
Physical Therapy Treatment Patient Details Name: Vincent [name] MRN: [id number] DOB: 05/21/1951 Today's Date: 09/01/2019    History of Present Illness 69 y/o male with hx of T2DM, metastatic pancreatic CA on chemotherapy, and HTN who presented 12/30 with hypoxia due to covid-19 pneumonia. Since admission, a full course of remdesivir and steroids were given with limited improvement, persistent, now severe, hypoxemia. Steroids have been restarted, CTA chest has shown confluent pneumonia and no pulmonary embolism. Broad spectrum antibiotics have been added empirically, though the patient's respiratory status has only deteriorated.    PT Comments    Pt admitted with above diagnosis. Pt was able to sit to EOB however DOE 3/4 and pt had to lay back down due to not being able to get his breath.  Desat to 68% from 92% on arrival and took almost 10 min to return to >90% on 100% NRB/HFNC.  Will continue to progress as able.  Agree with home with Hospice with 24 hour care versus SNF.   Pt currently with functional limitations due to the deficits listed below (see PT Problem List). Pt will benefit from skilled PT to increase their independence and safety with mobility to allow discharge to the venue listed below.     Follow Up Recommendations  SNF;Supervision/Assistance - 24 hour     Equipment Recommendations  None recommended by PT    Recommendations for Other Services       Precautions / Restrictions Precautions Precautions: Fall;Other (comment) Precaution Comments: 02 desat to 60s-70s w/ minimal activity Restrictions Weight Bearing Restrictions: No    Mobility  Bed Mobility Overal bed mobility: Needs Assistance Bed Mobility: Supine to Sit     Supine to sit: Supervision Sit to supine: Min assist   General bed mobility comments: Pt sat up quickly without assist by PT.  Pt DOE3/4 once up and almost immediately asked to lay back down as his sats went form 92% down to 68% once sitting.     Transfers                 General transfer comment: Did not attempt due to desat once he sat up and had to lie back down.   Ambulation/Gait             General Gait Details: did not attempt at this time sec to apprehension   Stairs             Wheelchair Mobility    Modified Rankin (Stroke Patients Only)       Balance   Sitting-balance support: Feet supported;Bilateral upper extremity supported Sitting balance-Leahy Scale: Fair Sitting balance - Comments: desat significantly sitting EOB.                                     Cognition Arousal/Alertness: Awake/alert Behavior During Therapy: WFL for tasks assessed/performed Overall Cognitive Status: Within Functional Limits for tasks assessed                                        Exercises      General Comments General comments (skin integrity, edema, etc.): Pt has on 100% NRB mask and HFNC.  81% initially sitting and then down to 68% for about 2 min and then slowly up to 80% in 2 more min and took 5 more min to  return to >90%.  HR 109 bpm, 118/90      Pertinent Vitals/Pain Pain Assessment: No/denies pain    Home Living                      Prior Function            PT Goals (current goals can now be found in the care plan section) Progress towards PT goals: Not progressing toward goals - comment(desats significantly with very little movement)    Frequency    Min 3X/week      PT Plan Discharge plan needs to be updated    Co-evaluation              AM-PAC PT "6 Clicks" Mobility   Outcome Measure  Help needed turning from your back to your side while in a flat bed without using bedrails?: None Help needed moving from lying on your back to sitting on the side of a flat bed without using bedrails?: None Help needed moving to and from a bed to a chair (including a wheelchair)?: A Lot Help needed standing up from a chair using your arms  (e.g., wheelchair or bedside chair)?: A Lot Help needed to walk in hospital room?: A Lot Help needed climbing 3-5 steps with a railing? : A Lot 6 Click Score: 16    End of Session Equipment Utilized During Treatment: Oxygen Activity Tolerance: Patient limited by fatigue;Treatment limited secondary to medical complications (Comment) Patient left: with call bell/phone within reach;in bed;with bed alarm set Nurse Communication: Mobility status PT Visit Diagnosis: Unsteadiness on feet (R26.81)     Time: [id number] PT Time Calculation (min) (ACUTE ONLY): 15 min  Charges:  $Therapeutic Activity: 8-22 mins                     Tramayne Sebesta W,PT Acute Rehabilitation Services Pager:  [phone]  Office:  [place] 09/01/2019, 1:25 PM

## 2019-09-01 NOTE — Progress Notes (Signed)
PROGRESS NOTE    Vincent [name]  [id number] DOB: 05-12-1951 DOA: 08/06/2019   PCP: Leonard Downing, MD   Brief Narrative: [phone] WM PMHx diabetes type 2 controlled with complication, HTN,andmetastatic pancreatic CA on chemo  who presented to the [facility] ED w/ weakness and a cough for 5 days. In the ED he was found to be COVID+, febrile to 101.8, and hypoxic with sats of 80% on RA requirin 8L of support to improve. A CXR was w/o clear infiltrate but CT noted diffuse B infiltrates c/w Covid  Subjective: Patient reports dyspnea with minimal exertion, reports generalized weakness, fatigue, and poor appetite  Assessment & Plan:   Principal Problem:   Pneumonia due to COVID-19 virus Active Problems:   Pancreatic cancer ([place])   Acute respiratory failure with hypoxia (HCC)   Type 2 diabetes mellitus ([place])   HLD (hyperlipidemia)   Acute hypoxic respiratory failure due to COVID-19 for pneumonia -Patient remains with significant oxygen requirement, he remains on NRB and 15 L high flow nasal cannula. -No Actemra given active malignancy and chemotherapy -Treated with IV steroids, patient has on prolonged steroid therapy given his profound hypoxia during hospital stay, he is currently on IV Solu-Medrol, will start tapering. -Treated with IV remdesivir. -Encouraged use incentive spirometry and flutter valve. COVID-19 Labs  Recent Labs    08/30/19 0355 08/31/19 0152 09/01/19 0500  DDIMER 2.84* 2.60* 2.60*  FERRITIN 655* 601*  --   CRP 0.7 0.8 0.8    Diabetes type 2 controlled with complication -[id number] hemoglobin A1c= 6.6 -Continue with current regimen including Lantus and sliding scale  HLD -Simvastatin 20 mg daily -1/2 LDL= 66  Metastatic Pancreatic  Cancer -Followed with oncology Dr. Burr Medico, I have discussed with her today, unfortunately patient with advanced metastatic pancreatic disease, patient with metastatic incurable pancreatic cancer, this would be an appropriate  option here, please see discussion below under goals of care.  Goals of care -I have discussed with wife, patient, and oncology today, overall patient prognosis remains very poor, especially in the setting of incurable pancreatic cancer, and severe COVID-19 respiratory failure, with significant oxygen requirement(he remains on both NRB and 15 L high flow nasal cannula), hospice/comfort would be an appropriate option here, I have discussed with wife, and she is agreeable to proceed with hospice, but patient isolation status remains unresolved, even though he is with COVID-19 diagnosis> 21 days, but he is significantly immunocompromised between his cancer, chemo and steroids, discussed with ID, patient will get another Covid 19 PCR test, to determine if isolation can be discontinued. -Palliative medicine following.   DVT prophylaxis: Lovenox Code Status:  DNR Family Communication: D/W wife Disposition Plan: No clear disposition yet, as patient still with very high oxygen requirement, and at one point he will need hospice,   Consultants:  pallaitive   Procedures:  12/31 admit via [facility] ED - [facility] to [facility] 1/20 PCXR;-BILATERAL pulmonary infiltrates consistent with multifocal pneumonia, slightly improved in LEFT upper lobe.  Antimicrobials:  Anti-infectives (From admission, onward)   Start     Dose/Rate Route Frequency Ordered Stop   08/16/19 2300  vancomycin (VANCOREADY) IVPB 1750 mg/350 mL     1,750 mg 175 mL/hr over 120 Minutes Intravenous Every 12 hours 08/16/19 1011 08/20/19 1400   08/16/19 1100  vancomycin (VANCOREADY) IVPB 2000 mg/400 mL     2,000 mg 200 mL/hr over 120 Minutes Intravenous  Once 08/16/19 0953 08/16/19 1400   08/16/19 1030  ceFEPIme (MAXIPIME) 2 g in sodium chloride  0.9 % 100 mL IVPB     2 g 200 mL/hr over 30 Minutes Intravenous Every 8 hours 08/16/19 0953 08/20/19 1745   08/08/19 1000  remdesivir 100 mg in sodium chloride 0.9 % 100 mL IVPB     100 mg 200 mL/hr over  30 Minutes Intravenous Daily 08/07/19 0151 08/11/19 0855   08/07/19 0200  remdesivir 200 mg in sodium chloride 0.9% 250 mL IVPB     200 mg 580 mL/hr over 30 Minutes Intravenous Once 08/07/19 0151 08/07/19 0341       Objective: Vitals:   09/01/19 0418 09/01/19 0730 09/01/19 0737 09/01/19 1139  BP:  (!) 122/93  113/85  Pulse:  (!) 109  (!) 115  Resp:  (!) 21  (!) 25  Temp:  97.6 F (36.4 C)  97.7 F (36.5 C)  TempSrc:  Axillary  Axillary  SpO2: 100% 90% 92% 91%  Weight:      Height:        Intake/Output Summary (Last 24 hours) at 09/01/2019 1413 Last data filed at 09/01/2019 0816 Gross per 24 hour  Intake 490 ml  Output 250 ml  Net 240 ml   Filed Weights   08/06/19 2031 08/21/19 0200  Weight: 105.2 kg 91.6 kg    Examination:  Awake Alert, Oriented X 3, extremely frail, no new F.N deficits, Normal affect Symmetrical Chest wall movement, Good air movement bilaterally, no wheezing RRR,No Gallops,Rubs or new Murmurs, No Parasternal Heave +ve B.Sounds, Abd Soft, No tenderness, No rebound - guarding or rigidity. No Cyanosis, Clubbing or edema, No new Rash or bruise     Data Reviewed: I have personally reviewed following labs and imaging studies  CBC: Recent Labs  Lab 08/27/19 1230 08/27/19 1230 08/28/19 0430 08/29/19 0400 08/30/19 0355 08/31/19 0152 09/01/19 0500  WBC 15.5*   < > 15.6* 13.0* 12.5* 18.5* 14.8*  NEUTROABS 14.3*  --  13.7* 11.8* 11.2* 17.6*  --   HGB 13.1   < > 12.5* 12.1* 12.1* 12.6* 12.2*  HCT 40.2   < > 38.6* 37.5* 38.7* 39.5 38.6*  MCV 100.0   < > 101.8* 101.6* 101.3* 100.3* 101.6*  PLT 202   < > 197 177 178 200 156   < > = values in this interval not displayed.   Basic Metabolic Panel: Recent Labs  Lab 08/28/19 0430 08/29/19 0400 08/30/19 0355 08/31/19 0152 09/01/19 0500  NA 138 138 139 140 141  K 4.4 4.6 4.3 4.4 4.4  CL 98 99 99 99 99  CO2 31 32 31 32 31  GLUCOSE 107* 178* 123* 152* 175*  BUN 33* 33* 30* 31* 31*  CREATININE 0.83  0.81 0.75 0.69 0.78  CALCIUM 8.3* 8.4* 8.8* 8.7* 8.5*  MG 2.3 2.1 2.2 2.3 2.3  PHOS 3.3 3.8 3.3 2.9 4.1   GFR: Estimated Creatinine Clearance: 111.5 mL/min (by C-G formula based on SCr of 0.78 mg/dL). Liver Function Tests: Recent Labs  Lab 08/28/19 0430 08/29/19 0400 08/30/19 0355 08/31/19 0152 09/01/19 0500  AST 23 25 24 25 22   ALT 45* 46* 40 39 37  ALKPHOS 83 82 83 85 79  BILITOT 1.1 0.7 1.2 1.1 0.7  PROT 6.5 6.0* 6.5 6.3* 5.8*  ALBUMIN 2.7* 2.7* 3.0* 2.9* 2.7*   No results for input(s): LIPASE, AMYLASE in the last 168 hours. No results for input(s): AMMONIA in the last 168 hours. Coagulation Profile: No results for input(s): INR, PROTIME in the last 168 hours. Cardiac Enzymes: No results for  input(s): CKTOTAL, CKMB, CKMBINDEX, TROPONINI in the last 168 hours. BNP (last 3 results) No results for input(s): PROBNP in the last 8760 hours. HbA1C: No results for input(s): HGBA1C in the last 72 hours. CBG: Recent Labs  Lab 08/31/19 1207 08/31/19 1619 08/31/19 2136 09/01/19 0735 09/01/19 1142  GLUCAP 171* 216* 167* 168* 153*   Lipid Profile: No results for input(s): CHOL, HDL, LDLCALC, TRIG, CHOLHDL, LDLDIRECT in the last 72 hours. Thyroid Function Tests: No results for input(s): TSH, T4TOTAL, FREET4, T3FREE, THYROIDAB in the last 72 hours. Anemia Panel: Recent Labs    08/30/19 0355 08/31/19 0152  FERRITIN 655* 601*   Sepsis Labs: No results for input(s): PROCALCITON, LATICACIDVEN in the last 168 hours.  No results found for this or any previous visit (from the past 240 hour(s)).   Radiology Studies: No results found.   Scheduled Meds: . vitamin C  500 mg Oral BID  . aspirin EC  81 mg Oral QPC breakfast  . Chlorhexidine Gluconate Cloth  6 each Topical Daily  . cholecalciferol  2,000 Units Oral Daily  . enoxaparin (LOVENOX) injection  40 mg Subcutaneous Q24H  . feeding supplement (ENSURE ENLIVE)  237 mL Oral TID BM  . gabapentin  300 mg Oral QHS  .  insulin aspart  0-20 Units Subcutaneous TID AC & HS  . insulin aspart  4 Units Subcutaneous TID WC  . insulin glargine  20 Units Subcutaneous QHS  . Ipratropium-Albuterol  1 puff Inhalation QID  . mouth rinse  15 mL Mouth Rinse BID  . methylPREDNISolone (SOLU-MEDROL) injection  60 mg Intravenous TID  . simvastatin  20 mg Oral QPM  . zinc sulfate  220 mg Oral Daily   Continuous Infusions:   LOS: 25 days     Phillips Climes, MD [facility]   If 7PM-7AM, please contact night-coverage

## 2019-09-01 NOTE — Progress Notes (Signed)
ID PROGRESS NOTE  CT value on covid test from today is 24 (since it is greater than 32, likely this is non-viable virus)  Since he is greater than 21 days out from his infection, he can be removed from airborne/contact isolation. No longer infectious.  If questions, feel free to call me.  Elzie Rings [facility] Infectious Diseases [phone]

## 2019-09-01 NOTE — Telephone Encounter (Signed)
I called Mrs Macinnes back, she is quite stressed and would like to see him in person if possible. She understand Veleta Miners is not doing well and requires high oxygen. She agrees with hospice, which I think is very reasonable given his severe COVID, and underline metastatic incurable pancreatic cancer. I spoke with hospitalist Dr. Guinevere Ferrari, he will test his COVID PCR, if negative then will cancel his isolation and let family to see him. He will call his wife today.   Truitt Merle MD

## 2019-09-02 LAB — GLUCOSE, CAPILLARY
Glucose-Capillary: 129 mg/dL — ABNORMAL HIGH (ref 70–99)
Glucose-Capillary: 148 mg/dL — ABNORMAL HIGH (ref 70–99)
Glucose-Capillary: 74 mg/dL (ref 70–99)
Glucose-Capillary: 88 mg/dL (ref 70–99)

## 2019-09-02 MED ORDER — MORPHINE SULFATE (CONCENTRATE) 10 MG/0.5ML PO SOLN
20.0000 mg | ORAL | Status: DC | PRN
  Administered 2019-09-02: 20 mg via SUBLINGUAL
  Filled 2019-09-02: qty 1

## 2019-09-02 MED ORDER — LORAZEPAM 2 MG/ML PO CONC: 1.0000 mg | ORAL | Status: DC | PRN

## 2019-09-02 MED ORDER — PREDNISONE 20 MG PO TABS
40.0000 mg | ORAL_TABLET | Freq: Every day | ORAL | Status: DC
  Administered 2019-09-03: 40 mg via ORAL
  Filled 2019-09-02: qty 2

## 2019-09-02 MED ORDER — GLYCOPYRROLATE 1 MG PO TABS: 1.0000 mg | ORAL_TABLET | Freq: Two times a day (BID) | ORAL | Status: DC | PRN

## 2019-09-02 NOTE — Progress Notes (Signed)
Palliative Medicine RN Note: Manufacturing engineer ([place]; [facility] [place] liaison Harmon Pier reports that Mrs Iglesias will not be able to accept delivery of equipment until she has help moving her furniture, so he will not be able to go home today, as planned with Dr Hilma Favors.  Marjie Skiff Elaijah [name], RN, BSN, [facility] Palliative Medicine Team 09/02/2019 1:37 PM Office [phone]

## 2019-09-02 NOTE — Progress Notes (Signed)
Manufacturing engineer Hospice  Received request from [place] for patient/family interest in hospice services at home after discharge. Chart reviewed and eligibility confirmed by hospice physician.   Spoke with patient's spouse by phone to explain services and confirm interest. Per spouse plan is for patient to return home to address in chart by PTAR. DME needs discussed. Hospital bed, overbed table and oxygen setup ordered for delivery to home. Appreciate Dr. Delanna Ahmadi oxygen recommendations. Oxygen ordered to those specifications. Spouse is contact to work with DME company to arrange delivery to home. Per spouse she has someone coming to move furniture tonight so DME can be delivered to home tomorrow morning.   Note request for hospice RN to be present on or shortly after arrival home. Will need to coordinate closely with [facility] manager to have as accurate as possible Mr. Licari time of arrival home.   Please send signed and completed DNR home with patient.   Please send home with patient scripts for any medication he does not already have.   [facility] referral center specialist will contact spouse to coordinate visit at home as well as hospital liaison will follow closely.   Please do not hesitate to call with [facility] related questions.   Thank you,  Erling Conte, LCSW [phone]  [facility] liaisons are listed daily on AMION under Hospice and [place]

## 2019-09-02 NOTE — TOC Initial Note (Signed)
Transition of Care [facility]) - Initial/Assessment Note    Patient Details  Name: Vincent [name] MRN: [id number] Date of Birth: Mar 15, 1951  Transition of Care [facility]) CM/SW Contact:    Pollie Friar, RN Phone Number: 09/02/2019, 2:40 PM  Clinical Narrative:                 [facility] consulted for home with hospice with oxygen via [place] and mask. CM has reached out to [place] after speaking with patients spouse and providing her choice. Harmon Pier with [facility] arranging home DME and oxygen. She states they will be able to provide the oxygen needed at home.  Plan will be for pt to d/c home in am after DME delivered to the home (wife has to move some furniture at the home to accommodate the DME).  CM spoke to [facility] about transport home with the increased oxygen need. They feel they can provide the needed oxygen for transport. They will need to know in am so can arrange a truck with extra tanks.  TOC following for further d/c needs.   Expected Discharge Plan: Home w Hospice Care Barriers to Discharge: Continued Medical Work up   Patient Goals and CMS Choice   CMS Medicare.gov Compare Post Acute Care list provided to:: Patient Represenative (must comment) Choice offered to / list presented to : Spouse  Expected Discharge Plan and Services Expected Discharge Plan: [place]   Discharge Planning Services: CM Consult Post Acute Care Choice: Hospice Living arrangements for the past 2 months: [place] Agency: Hospice and [place] Date [place]: 09/02/19   Representative spoke with at [place]: Harmon Pier  Prior Living Arrangements/Services Living arrangements for the past 2 months: [place] Lives with:: Spouse Patient language and need for interpreter reviewed:: Yes Do you feel safe going back to the place where you live?: Yes      Need for Family Participation in Patient Care: Yes (Comment) Care  giver support system in place?: Yes (comment)   Criminal Activity/Legal Involvement Pertinent to Current Situation/Hospitalization: No - Comment as needed  Activities of Daily Living Home Assistive Devices/Equipment: CBG Meter, Eyeglasses ADL Screening (condition at time of admission) Patient's cognitive ability adequate to safely complete daily activities?: Yes Is the patient deaf or have difficulty hearing?: No Does the patient have difficulty seeing, even when wearing glasses/contacts?: No Does the patient have difficulty concentrating, remembering, or making decisions?: No Patient able to express need for assistance with ADLs?: Yes Does the patient have difficulty dressing or bathing?: No Independently performs ADLs?: Yes (appropriate for developmental age) Does the patient have difficulty walking or climbing stairs?: Yes(secondary to shortness of breath) Weakness of Legs: Both Weakness of Arms/Hands: None  Permission Sought/Granted                  Emotional Assessment Appearance:: Appears stated age     Orientation: : Oriented to Self, Oriented to Place, Oriented to  Time, Oriented to Situation   Psych Involvement: No (comment)  Admission diagnosis:  Cough [R05] Hypoxia [R09.02] Acute respiratory failure with hypoxia ([place]) [J96.01] COVID-19 virus infection [U07.1] Patient Active Problem List   Diagnosis Date Noted  . Acute respiratory failure with hypoxia ([place]) 08/07/2019  . Pneumonia due to COVID-19 virus 08/07/2019  . Type 2 diabetes  mellitus ([place]) 08/07/2019  . HLD (hyperlipidemia) 08/07/2019  . Family history of lung cancer   . Monoallelic mutation of BRIP1 gene   . Monoallelic mutation of MUTYH gene   . Port-A-Cath in place 12/26/2018  . Genetic testing 12/05/2018  . Goals of care, counseling/discussion 11/14/2018  . Pancreatic cancer ([place]) 11/04/2018  . HNP (herniated nucleus pulposus), lumbar 10/11/2017  . Spinal stenosis at L4-L5 level 10/11/2017    PCP:  Leonard Downing, MD Pharmacy:   [id number] [place] [id number]: [phone]: [phone] [address] [id number]: [phone]: [phone]    Social Determinants of Health (SDOH) Interventions    Readmission Risk Interventions No flowsheet data found.

## 2019-09-02 NOTE — Progress Notes (Addendum)
Palliative Medicine RN Note: Note sign out, progress note, and order from Dr Hilma Favors that patient will be going home with hospice ASAP. Called RNCM/TOC for 2W; left message offering assistance prn, including my cell phone number.  Marjie Skiff Linea Calles, RN, BSN, [facility] Palliative Medicine Team 09/02/2019 12:26 PM Office [phone]

## 2019-09-02 NOTE — Progress Notes (Signed)
PROGRESS NOTE    Vincent [name]  [id number] DOB: January 09, 1951 DOA: 08/06/2019   PCP: Leonard Downing, MD   Brief Narrative: [phone] WM PMHx diabetes type 2 controlled with complication, HTN,andmetastatic pancreatic CA on chemo  who presented to the [facility] ED w/ weakness and a cough for 5 days. In the ED he was found to be COVID+, febrile to 101.8, and hypoxic with sats of 80% on RA requirin 8L of support to improve. A CXR was w/o clear infiltrate but CT noted diffuse B infiltrates c/w Covid, patient was admitted to [facility], and was treated with prolonged course of steroids, remdesivir, as well he did not receive Actemra given his active malignancy, patient remains with significant oxygen requirement, 15 L high flow nasal cannula, and NRB, palliative medicine has been consulted, as well discussed with oncology, patient with uncurable metastatic pancreatic cancer, with no improvement with COVID-19 pneumonia, decision has been made to transition to hospice.  Subjective:  Patient still reports significant dyspnea with minimal exertion, reports generalized weakness, fatigue, but overall all report he has been comfortable, and dyspnea has improved with as needed morphine.  Assessment & Plan:   Principal Problem:   Pneumonia due to COVID-19 virus Active Problems:   Pancreatic cancer ([place])   Acute respiratory failure with hypoxia (HCC)   Type 2 diabetes mellitus (HCC)   HLD (hyperlipidemia)   Acute hypoxic respiratory failure due to COVID-19 for pneumonia -Patient  with significant oxygen requirement, he remains on NRB and 15 L high flow nasal cannula, please see discussion below regarding goals of care, and to discharge tomorrow with hospice service, eczema oxygen can be delivered home on hospice setting it is 10 L via high flow nasal cannula on 1  concentrator, and 10 L via NRB via another concentrator, so hospice will arrange for 2 oxygen concentrators at home, I have  tried patient today on 2 L via nasal cannula, and 10 L via NRB, and to maintain saturation in the mid 90s, where he appears to be comfortable. -No Actemra given active malignancy and chemotherapy -Treated with IV steroids, likely transition to oral prednisone, will need prolonged taper as an outpatient. -Treated with IV remdesivir. -Encouraged use incentive spirometry and flutter valve. -No need for further isolation for COVID-19, even though repeat test 1/25 is positive, but CT value for Covid was 38(since it is greater than 32, likely this is nonviable virus), ID input greatly appreciated.  COVID-19 Labs  Recent Labs    08/31/19 0152 09/01/19 0500  DDIMER 2.60* 2.60*  FERRITIN 601*  --   CRP 0.8 0.8    Diabetes type 2 controlled with complication -[id number] hemoglobin A1c= 6.6 -Continue with current regimen including Lantus and sliding scale -He can be discharged on low-dose Metformin tomorrow  HLD -Simvastatin 20 mg daily -1/2 LDL= 66  Metastatic Pancreatic  Cancer -Followed with oncology Dr. Burr Medico, I have discussed with her today, unfortunately patient with advanced metastatic pancreatic disease, patient with metastatic incurable pancreatic cancer, this would be an appropriate option here, please see discussion below under goals of care.  Goals of care -I have discussed with wife, patient, and oncology  overall patient prognosis remains very poor, especially in the setting of incurable pancreatic cancer, and severe COVID-19 respiratory failure, with significant oxygen requirement, palliative input greatly appreciated, at this point plan to discharge hospice tomorrow once equipment are delivered, discussed at length with wife today, explained the use of as needed morphine and Ativan for his dyspnea,  and for comfort measures at end-of-life .    DVT prophylaxis: Lovenox Code Status:  DNR Family Communication: D/W wife at bedside Disposition Plan: Home with home hospice tomorrow once  equipment are delivered  Consultants:  pallaitive   Procedures:  12/31 admit via [facility] ED - transfer to [facility] 1/20 PCXR;-BILATERAL pulmonary infiltrates consistent with multifocal pneumonia, slightly improved in LEFT upper lobe.  Antimicrobials:  Anti-infectives (From admission, onward)   Start     Dose/Rate Route Frequency Ordered Stop   08/16/19 2300  vancomycin (VANCOREADY) IVPB 1750 mg/350 mL     1,750 mg 175 mL/hr over 120 Minutes Intravenous Every 12 hours 08/16/19 1011 08/20/19 1400   08/16/19 1100  vancomycin (VANCOREADY) IVPB 2000 mg/400 mL     2,000 mg 200 mL/hr over 120 Minutes Intravenous  Once 08/16/19 0953 08/16/19 1400   08/16/19 1030  ceFEPIme (MAXIPIME) 2 g in sodium chloride 0.9 % 100 mL IVPB     2 g 200 mL/hr over 30 Minutes Intravenous Every 8 hours 08/16/19 0953 08/20/19 1745   08/08/19 1000  remdesivir 100 mg in sodium chloride 0.9 % 100 mL IVPB     100 mg 200 mL/hr over 30 Minutes Intravenous Daily 08/07/19 0151 08/11/19 0855   08/07/19 0200  remdesivir 200 mg in sodium chloride 0.9% 250 mL IVPB     200 mg 580 mL/hr over 30 Minutes Intravenous Once 08/07/19 0151 08/07/19 0341       Objective: Vitals:   09/02/19 0349 09/02/19 0400 09/02/19 0812 09/02/19 1129  BP:  (!) 125/96 (!) 132/95 104/82  Pulse: 98 97 88 (!) 112  Resp: 18 18 20  (!) 21  Temp:  98.5 F (36.9 C) 97.9 F (36.6 C) 97.9 F (36.6 C)  TempSrc:  Oral Oral Oral  SpO2: 96% 94% 99% 97%  Weight:      Height:        Intake/Output Summary (Last 24 hours) at 09/02/2019 1218 Last data filed at 09/01/2019 2300 Gross per 24 hour  Intake 540 ml  Output --  Net 540 ml   Filed Weights   08/06/19 2031 08/21/19 0200  Weight: 105.2 kg 91.6 kg    Examination:  Awake Alert, Oriented X 3, extremely frail, no new F.N deficits, Normal affect Symmetrical Chest wall movement, Good air movement bilaterally, CTAB RRR,No Gallops,Rubs or new Murmurs, No Parasternal Heave +ve B.Sounds, Abd Soft, No  tenderness, No rebound - guarding or rigidity. No Cyanosis, Clubbing or edema, No new Rash or bruise      Data Reviewed: I have personally reviewed following labs and imaging studies  CBC: Recent Labs  Lab 08/27/19 1230 08/27/19 1230 08/28/19 0430 08/29/19 0400 08/30/19 0355 08/31/19 0152 09/01/19 0500  WBC 15.5*   < > 15.6* 13.0* 12.5* 18.5* 14.8*  NEUTROABS 14.3*  --  13.7* 11.8* 11.2* 17.6*  --   HGB 13.1   < > 12.5* 12.1* 12.1* 12.6* 12.2*  HCT 40.2   < > 38.6* 37.5* 38.7* 39.5 38.6*  MCV 100.0   < > 101.8* 101.6* 101.3* 100.3* 101.6*  PLT 202   < > 197 177 178 200 156   < > = values in this interval not displayed.   Basic Metabolic Panel: Recent Labs  Lab 08/28/19 0430 08/29/19 0400 08/30/19 0355 08/31/19 0152 09/01/19 0500  NA 138 138 139 140 141  K 4.4 4.6 4.3 4.4 4.4  CL 98 99 99 99 99  CO2 31 32 31 32 31  GLUCOSE 107*  178* 123* 152* 175*  BUN 33* 33* 30* 31* 31*  CREATININE 0.83 0.81 0.75 0.69 0.78  CALCIUM 8.3* 8.4* 8.8* 8.7* 8.5*  MG 2.3 2.1 2.2 2.3 2.3  PHOS 3.3 3.8 3.3 2.9 4.1   GFR: Estimated Creatinine Clearance: 111.5 mL/min (by C-G formula based on SCr of 0.78 mg/dL). Liver Function Tests: Recent Labs  Lab 08/28/19 0430 08/29/19 0400 08/30/19 0355 08/31/19 0152 09/01/19 0500  AST 23 25 24 25 22   ALT 45* 46* 40 39 37  ALKPHOS 83 82 83 85 79  BILITOT 1.1 0.7 1.2 1.1 0.7  PROT 6.5 6.0* 6.5 6.3* 5.8*  ALBUMIN 2.7* 2.7* 3.0* 2.9* 2.7*   No results for input(s): LIPASE, AMYLASE in the last 168 hours. No results for input(s): AMMONIA in the last 168 hours. Coagulation Profile: No results for input(s): INR, PROTIME in the last 168 hours. Cardiac Enzymes: No results for input(s): CKTOTAL, CKMB, CKMBINDEX, TROPONINI in the last 168 hours. BNP (last 3 results) No results for input(s): PROBNP in the last 8760 hours. HbA1C: No results for input(s): HGBA1C in the last 72 hours. CBG: Recent Labs  Lab 09/01/19 1142 09/01/19 1521  09/01/19 2054 09/02/19 0813 09/02/19 1128  GLUCAP 153* 145* 136* 129* 148*   Lipid Profile: No results for input(s): CHOL, HDL, LDLCALC, TRIG, CHOLHDL, LDLDIRECT in the last 72 hours. Thyroid Function Tests: No results for input(s): TSH, T4TOTAL, FREET4, T3FREE, THYROIDAB in the last 72 hours. Anemia Panel: Recent Labs    08/31/19 0152  FERRITIN 601*   Sepsis Labs: No results for input(s): PROCALCITON, LATICACIDVEN in the last 168 hours.  Recent Results (from the past 240 hour(s))  Respiratory Panel by RT PCR (Flu A&B, Covid) - Nasopharyngeal Swab     Status: Abnormal   Collection Time: 09/01/19 12:41 PM   Specimen: Nasopharyngeal Swab  Result Value Ref Range Status   SARS Coronavirus 2 by RT PCR POSITIVE (A) NEGATIVE Final    Comment: RESULT CALLED TO, READ BACK BY AND VERIFIED WITH: Waneta Martins RN 14:45 09/01/19 ([email or web address]) (NOTE) SARS-CoV-2 target nucleic acids are DETECTED. SARS-CoV-2 RNA is generally detectable in upper respiratory specimens  during the acute phase of infection. Positive results are indicative of the presence of the identified virus, but do not rule out bacterial infection or co-infection with other pathogens not detected by the test. Clinical correlation with patient history and other diagnostic information is necessary to determine patient infection status. The expected result is Negative. Fact Sheet for Patients:  PinkCheek.be Fact Sheet for Healthcare Providers: GravelBags.it This test is not yet approved or cleared by the [place] FDA and  has been authorized for detection and/or diagnosis of SARS-CoV-2 by FDA under an Emergency Use Authorization (EUA).  This EUA will remain in effect (meaning this test can be used)  for the duration of  the COVID-19 declaration under Section 564(b)(1) of the Act, 21 U.S.C. section 360bbb-3(b)(1), unless the authorization is terminated or revoked  sooner.    Influenza A by PCR NEGATIVE NEGATIVE Final   Influenza B by PCR NEGATIVE NEGATIVE Final    Comment: (NOTE) The Xpert Xpress SARS-CoV-2/FLU/RSV assay is intended as an aid in  the diagnosis of influenza from Nasopharyngeal swab specimens and  should not be used as a sole basis for treatment. Nasal washings and  aspirates are unacceptable for Xpert Xpress SARS-CoV-2/FLU/RSV  testing. Fact Sheet for Patients: PinkCheek.be Fact Sheet for Healthcare Providers: GravelBags.it This test is not yet approved or cleared by the [place]  States FDA and  has been authorized for detection and/or diagnosis of SARS-CoV-2 by  FDA under an Emergency Use Authorization (EUA). This EUA will remain  in effect (meaning this test can be used) for the duration of the  Covid-19 declaration under Section 564(b)(1) of the Act, 21  U.S.C. section 360bbb-3(b)(1), unless the authorization is  terminated or revoked. Performed at [facility], [place] [address], [place], [place] [address]      Radiology Studies: No results found.   Scheduled Meds: . vitamin C  500 mg Oral BID  . aspirin EC  81 mg Oral QPC breakfast  . Chlorhexidine Gluconate Cloth  6 each Topical Daily  . cholecalciferol  2,000 Units Oral Daily  . enoxaparin (LOVENOX) injection  40 mg Subcutaneous Q24H  . feeding supplement (ENSURE ENLIVE)  237 mL Oral TID BM  . gabapentin  300 mg Oral QHS  . insulin aspart  0-20 Units Subcutaneous TID AC & HS  . insulin aspart  4 Units Subcutaneous TID WC  . insulin glargine  20 Units Subcutaneous QHS  . Ipratropium-Albuterol  1 puff Inhalation QID  . mouth rinse  15 mL Mouth Rinse BID  . [START ON 09/03/2019] predniSONE  40 mg Oral Q breakfast  . simvastatin  20 mg Oral QPM  . zinc sulfate  220 mg Oral Daily   Continuous Infusions:   LOS: 26 days     Phillips Climes, MD [facility]   If 7PM-7AM, please contact  night-coverage

## 2019-09-02 NOTE — Progress Notes (Signed)
Palliative Care Follow-up Note  Mr. Gersh was seen this afternoon following transfer from isolation unit to progressive care bed. At the time of my visit his wife was in route to see him-the have not seen each other in nearly 4 weeks. Mr. Dunnett is likely approaching EOL- he is able to very clearly tell me his goal is to go home, to die at home and to be with his family. The barrier has been his very high O2 requirements and he is still extremely symptomatic with PNA- he is breathing about 40 times a min and desats quickly with minimal activity. He has PRN opioids for dyspnea, we discussed scheduling those but he wants to wait until his wife arrives.  Our plan in the mean time is to get a referral to hospice ASAP in AM. Transition to Roxanol scheduled and PRN. He will need the max amount of O2 available in the home (likely two concentrators). He can be on a Salter nasal cannula with one concentrator at 10 and a full face mask over that at 10 which is likely the best we can do at home-other wise his dyspnea will need to treated with opioids. Additionally we must ensure that the hospice RN can be present on or shortly after his arrival at home to do the admission and support this fragile transition.  Will continue to follow closely.  Lane Hacker, DO Palliative Medicine   Time:35 min Greater than 50%  of this time was spent counseling and coordinating care related to the above assessment and plan.

## 2019-09-02 NOTE — Progress Notes (Signed)
Physical Therapy Treatment Patient Details Name: Vincent [name] MRN: [id number] DOB: 02-Apr-1951 Today's Date: 09/02/2019    History of Present Illness 69 y/o male with hx of T2DM, metastatic pancreatic CA on chemotherapy, and HTN who presented 12/30 with hypoxia due to covid-19 pneumonia. Since admission, a full course of remdesivir and steroids were given with limited improvement, persistent, now severe, hypoxemia. Steroids have been restarted, CTA chest has shown confluent pneumonia and no pulmonary embolism. Broad spectrum antibiotics have been added empirically, though the patient's respiratory status has only deteriorated.    PT Comments    Pt admitted with above diagnosis. Pt able to use Stedy to get into chair with min assist by PT. Pt very weak with poor trunk stability as well as poor ability to stand for long periods.  Needed bed elevated quite a bit as well. Pt will need an ambulance transport home as he will  Not be able to go up and down 6 steps into his home.  He also will need a hospital bed and wheelchair.  Will continue to follow acutely.  Pt currently with functional limitations due to balance and endurance deficits. Pt will benefit from skilled PT to increase their independence and safety with mobility to allow discharge to the venue listed below.     Follow Up Recommendations  Home health PT;Supervision/Assistance - 24 hour     Equipment Recommendations  Hospital bed;Wheelchair Teaching laboratory technician wheelchair with foot rests, desk armrests and anti tippers );Wheelchair cushion (18 x 18 pressure relieving)(Bedside table),  hoyer lift   Recommendations for Other Services       Precautions / Restrictions Precautions Precautions: Fall;Other (comment) Precaution Comments: 02 desat to 60s-70s w/ minimal activity Restrictions Weight Bearing Restrictions: No    Mobility  Bed Mobility Overal bed mobility: Needs Assistance Bed Mobility: Supine to Sit     Supine to  sit: Supervision Sit to supine: Supervision   General bed mobility comments: Pt sat up quickly without assist by PT.  Pt DOE3/4 once up.  Pt attempt to move to chair before PT ready and pt trunk toppled over onto chair arm and PT assisted back to sittnig and pt laide down in bed.  Rested with satss to 70's.  Sats back to  92% and obtained Stedy to get pt up.  Second attempt to sit, ptable to sit up with min guard assist.   Transfers Overall transfer level: Needs assistance   Transfers: Sit to/from Stand Sit to Stand: Min assist;From elevated surface Stand pivot transfers: Total assist(with Stedy)       General transfer comment: Pt was able to tolerate standing to [facility] with min assist and be moved to recliner.  Stood to move pads off Stedy and got in Psychologist, occupational.  Sats as low as 61% once up and took incr time to return to 80's.   Ambulation/Gait             General Gait Details: did not attempt at this time sec to apprehension   Stairs             Wheelchair Mobility    Modified Rankin (Stroke Patients Only)       Balance   Sitting-balance support: Feet supported;Bilateral upper extremity supported Sitting balance-Leahy Scale: Fair Sitting balance - Comments: desat significantly sitting EOB.    Standing balance support: During functional activity;Bilateral upper extremity supported Standing balance-Leahy Scale: Poor Standing balance comment: Relies on UE support.  Cognition Arousal/Alertness: Awake/alert Behavior During Therapy: WFL for tasks assessed/performed Overall Cognitive Status: Within Functional Limits for tasks assessed                                        Exercises      General Comments General comments (skin integrity, edema, etc.): 100% NRB and HFNC See above.  HR stable.       Pertinent Vitals/Pain Pain Assessment: No/denies pain    Home Living                      Prior  Function            PT Goals (current goals can now be found in the care plan section) Progress towards PT goals: Progressing toward goals    Frequency    Min 3X/week      PT Plan Discharge plan needs to be updated    Co-evaluation              AM-PAC PT "6 Clicks" Mobility   Outcome Measure  Help needed turning from your back to your side while in a flat bed without using bedrails?: None Help needed moving from lying on your back to sitting on the side of a flat bed without using bedrails?: None Help needed moving to and from a bed to a chair (including a wheelchair)?: A Lot Help needed standing up from a chair using your arms (e.g., wheelchair or bedside chair)?: A Lot Help needed to walk in hospital room?: Total Help needed climbing 3-5 steps with a railing? : Total 6 Click Score: 14    End of Session Equipment Utilized During Treatment: Oxygen;Gait belt Activity Tolerance: Patient limited by fatigue;Treatment limited secondary to medical complications (Comment) Patient left: with call bell/phone within reach;in chair Nurse Communication: Mobility status;Need for lift equipment PT Visit Diagnosis: Unsteadiness on feet (R26.81)     Time: [id number] PT Time Calculation (min) (ACUTE ONLY): 27 min  Charges:  $Therapeutic Activity: 23-37 mins                     Tam Delisle W,PT Acute Rehabilitation Services Pager:  [phone]  Office:  [place] 09/02/2019, 9:42 AM

## 2019-09-03 DIAGNOSIS — C259 Malignant neoplasm of pancreas, unspecified: Secondary | ICD-10-CM

## 2019-09-03 LAB — GLUCOSE, CAPILLARY
Glucose-Capillary: 99 mg/dL (ref 70–99)
Glucose-Capillary: 134 mg/dL — ABNORMAL HIGH (ref 70–99)

## 2019-09-03 MED ORDER — MORPHINE SULFATE (CONCENTRATE) 10 MG/0.5ML PO SOLN: 20.0000 mg | ORAL | 0 refills | Status: AC | PRN

## 2019-09-03 MED ORDER — HEPARIN SOD (PORK) LOCK FLUSH 100 UNIT/ML IV SOLN
500.0000 [IU] | INTRAVENOUS | Status: AC | PRN
  Administered 2019-09-03: 500 [IU]
  Filled 2019-09-03: qty 5

## 2019-09-03 MED ORDER — HYDROCOD POLST-CPM POLST ER 10-8 MG/5ML PO SUER: 5.0000 mL | Freq: Two times a day (BID) | ORAL | 0 refills | Status: AC | PRN

## 2019-09-03 MED ORDER — LORAZEPAM 2 MG/ML PO CONC: 1.0000 mg | ORAL | 0 refills | Status: DC | PRN

## 2019-09-03 MED ORDER — MELATONIN 3 MG PO TABS: 3.0000 mg | ORAL_TABLET | Freq: Every evening | ORAL | 0 refills | Status: AC | PRN

## 2019-09-03 MED ORDER — IPRATROPIUM-ALBUTEROL 20-100 MCG/ACT IN AERS: 1.0000 | INHALATION_SPRAY | Freq: Four times a day (QID) | RESPIRATORY_TRACT | 0 refills | Status: AC

## 2019-09-03 MED ORDER — PREDNISONE 20 MG PO TABS: 40.0000 mg | ORAL_TABLET | Freq: Every day | ORAL | 0 refills | Status: AC

## 2019-09-03 MED ORDER — LORAZEPAM 0.5 MG PO TABS: 0.5000 mg | ORAL_TABLET | Freq: Four times a day (QID) | ORAL | 0 refills | Status: AC | PRN

## 2019-09-03 MED ORDER — GLYCOPYRROLATE 1 MG PO TABS: 1.0000 mg | ORAL_TABLET | Freq: Two times a day (BID) | ORAL | 0 refills | Status: AC | PRN

## 2019-09-03 NOTE — Care Management Important Message (Signed)
Important Message  Patient Details  Name: Vincent [name] MRN: [id number] Date of Birth: 10-Feb-1951   Medicare Important Message Given:  Yes     Orbie Pyo 09/03/2019, 12:05 PM

## 2019-09-03 NOTE — Discharge Summary (Addendum)
Physician Discharge Summary  Vincent [name] [id number] DOB: [birth year] DOA: 08/06/2019  PCP: Leonard Downing, MD  Admit date: 08/06/2019 Discharge date: 09/03/2019  Admitted From: Home Disposition: Home with hospice  Recommendations for Outpatient Follow-up:  1. Follow up with hospice at home at earliest [place]: Home hospice Equipment/Devices: Oxygen via nasal cannula for comfort  Discharge Condition: Poor CODE STATUS: DNR Diet recommendation: As per comfort measures  Brief/Interim Summary: 69 year old male with history of diabetes mellitus type 2, hypertension and metastatic pancreatic cancer on chemotherapy presented with worsening shortness of breath and cough.  He was found to be Covid positive, febrile and hypoxic requiring 8 L oxygen via nasal cannula in the ED.  CT of the chest noted diffuse infiltrates.  Patient was admitted to [facility] and was treated with prolonged course of steroids, remdesivir but did not receive Actemra given his active malignancy.  Patient required very high flow oxygen via nasal cannula up to 15 L and nonrebreather.  Palliative care was consulted.  Case was also discussed with oncology.  Because of patient's uncurable metastatic pancreatic cancer and worsening respiratory status, decision was made by patient/family to transition to home with hospice.  He will be discharged to home with hospice today if arrangements can be made.  Discharge Diagnoses:   Acute hypoxic respiratory failure due to COVID-19 pneumonia Diabetes mellitus type 2 controlled Hyperlipidemia Metastatic pancreatic cancer Leukocytosis Chronic macrocytic anemia: Questionable cause Generalized deconditioning  Plan -During the hospitalization, patient was treated with steroids and remdesivir and has subsequently been switched to oral prednisone.  He has had significant oxygen requirement requiring nonrebreather as well as 15 L high flow  nasal cannula.  He is still requiring high flow nasal cannula oxygen. -Even though TB test was positive on 09/01/2019, I recommended to discontinue isolation which has already been done. -Because of overall poor prognosis and worsening respiratory status, after discussion with palliative care and oncology, patient/family has decided to transition to home with hospice. -Discharge to home with hospice today if arrangements can be made.  And use morphine for air hunger and Ativan for anxiety.  Will also continue prednisone 40 mg daily for 7 days for now which might help with his breathing.  Rest of the oral medications can be discontinued if patient is not able to swallow at home because of worsening condition.   Discharge Instructions  Discharge Instructions    Diet - low sodium heart healthy   Complete by: As directed    Diet Carb Modified   Complete by: As directed      Allergies as of 09/03/2019   No Known Allergies     Medication List    STOP taking these medications   Accu-Chek FastClix Lancets Misc   eltrombopag 25 MG tablet Commonly known as: PROMACTA   GINGER ROOT PO   Klor-Con M20 20 MEQ tablet Generic drug: potassium chloride SA   lisinopril 10 MG tablet Commonly known as: ZESTRIL   metFORMIN 1000 MG tablet Commonly known as: GLUCOPHAGE   traMADol 50 MG tablet Commonly known as: ULTRAM     TAKE these medications   aspirin EC 81 MG tablet Take 1 tablet (81 mg total) by mouth daily after breakfast. Resume 4 days post-op   chlorpheniramine-HYDROcodone 10-8 MG/5ML Suer Commonly known as: TUSSIONEX Take 5 mLs by mouth every 12 (twelve) hours as needed for cough.   gabapentin 100 MG capsule Commonly known as: NEURONTIN Take 3 capsules (300 mg total)  by mouth at bedtime. May gradually increase to 300 mg at night   glycopyrrolate 1 MG tablet Commonly known as: ROBINUL Take 1 tablet (1 mg total) by mouth 2 (two) times daily as needed (secretions).    Ipratropium-Albuterol 20-100 MCG/ACT Aers respimat Commonly known as: COMBIVENT Inhale 1 puff into the lungs 4 (four) times daily.   lidocaine-prilocaine cream Commonly known as: EMLA Apply to affected area once   LORazepam 0.5 MG tablet Commonly known as: ATIVAN Place 1 tablet (0.5 mg total) under the tongue every 6 (six) hours as needed for anxiety.   Melatonin 3 MG Tabs Take 1 tablet (3 mg total) by mouth at bedtime as needed (sleep).   morphine CONCENTRATE 10 MG/0.5ML Soln concentrated solution Place 1 mL (20 mg total) under the tongue every hour as needed for shortness of breath.   ondansetron 8 MG tablet Commonly known as: ZOFRAN Take 1 tablet (8 mg total) by mouth 2 (two) times daily as needed for refractory nausea / vomiting. Start on day 3 after chemotherapy.   predniSONE 20 MG tablet Commonly known as: DELTASONE Take 2 tablets (40 mg total) by mouth daily with breakfast for 7 days. Start taking on: September 04, 2019   prochlorperazine 10 MG tablet Commonly known as: COMPAZINE Take 1 tablet (10 mg total) by mouth every 6 (six) hours as needed (NAUSEA).   simvastatin 40 MG tablet Commonly known as: ZOCOR Take 20 mg by mouth every evening.         No Known Allergies  Consultations:  Palliative care/ID/oncology   Procedures/Studies: CT ANGIO CHEST PE W OR WO CONTRAST  Result Date: 08/16/2019 CLINICAL DATA:  Shortness of breath. History of pancreatic carcinoma EXAM: CT ANGIOGRAPHY CHEST WITH CONTRAST TECHNIQUE: Multidetector CT imaging of the chest was performed using the standard protocol during bolus administration of intravenous contrast. Multiplanar CT image reconstructions and MIPs were obtained to evaluate the vascular anatomy. CONTRAST:  4mL OMNIPAQUE IOHEXOL 350 MG/ML SOLN COMPARISON:  August 07, 2019 CT angiogram chest; chest radiograph August 13, 2019 FINDINGS: Cardiovascular: There is no demonstrable pulmonary embolus. No thoracic aortic aneurysm  or dissection. Visualized great vessels appear unremarkable. There is no appreciable pericardial effusion or pericardial thickening. There are occasional foci of coronary artery calcification. Port-A-Cath tip in superior vena cava. Mediastinum/Nodes: Thyroid appears normal. There are scattered subcentimeter mediastinal lymph nodes without adenopathy by size criteria evident. No esophageal lesions are appreciable by CT. Lungs/Pleura: There remains ground-glass opacity throughout the lungs diffusely, slightly progressed overall compared to the previous study. There are varying degrees of ground-glass opacity in virtually all lobes in segments. There may well be a degree of underlying small airways obstructive disease given an underlying somewhat mosaic appearance throughout the lungs. No pleural effusions are evident. Upper Abdomen: There is again noted a mass in the right lobe of the liver measuring 5.9 x 5.6 cm. Visualized upper abdominal structures are otherwise unremarkable in appearance Musculoskeletal: There is degenerative change in the thoracic spine with diffuse idiopathic skeletal hyperostosis. No blastic or lytic bone lesions are evident. No chest wall lesions are appreciable. Review of the MIP images confirms the above findings. IMPRESSION: 1. No demonstrable pulmonary embolus. No thoracic aortic aneurysm or dissection. Mild coronary artery calcification. 2. Widespread ground-glass type opacity consistent with multifocal pneumonia, likely of atypical organism etiology. Suspect underlying fairly diffuse small airways obstructive disease, characterized by mosaic attenuation of the lung parenchyma at multiple sites. 3.  No evident adenopathy. 4.  Suspected metastasis in the  anterior segment right lobe liver. 5.  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 Electronically Signed   By: Lowella Grip III M.D.   On: 08/16/2019 09:20   CT Angio Chest PE W and/or Wo Contrast  Result Date:  08/07/2019 CLINICAL DATA:  Hypoxemia. Cough and weakness. History of pancreatic cancer with ongoing chemotherapy. COVID-19 positive. EXAM: CT ANGIOGRAPHY CHEST WITH CONTRAST TECHNIQUE: Multidetector CT imaging of the chest was performed using the standard protocol during bolus administration of intravenous contrast. Multiplanar CT image reconstructions and MIPs were obtained to evaluate the vascular anatomy. CONTRAST:  135mL OMNIPAQUE IOHEXOL 350 MG/ML SOLN COMPARISON:  Radiograph yesterday. Chest CT 11/27/2018 FINDINGS: Cardiovascular: There are no filling defects within the pulmonary arteries to suggest pulmonary embolus. Subsegmental branches are not well assessed due to contrast bolus timing, as well as breathing motion artifact through the lower lobes. Right chest port with tip in the SVC. Mild aortic tortuosity without acute abnormality. No aortic aneurysm. Borderline cardiomegaly. Coronary artery calcifications. Mediastinum/Nodes: No enlarged mediastinal or hilar lymph nodes. No visualized thyroid nodule. No esophageal wall thickening. Lungs/Pleura: Patchy heterogeneous ground-glass opacities throughout all lobes of both lungs, moderate to extensive parenchymal involvement. No septal thickening or pulmonary edema. No pleural fluid. Upper Abdomen: Low-density in the left lobe of the liver, patient with known metastatic disease, not well characterized on the current exam due to phase of contrast. Musculoskeletal: Multilevel degenerative change in the spine. No focal bone lesion. Review of the MIP images confirms the above findings. IMPRESSION: 1. No pulmonary embolus. 2. Patchy heterogeneous ground-glass opacities throughout all lobes of both lungs, consistent with pneumonia, pattern typical of COVID-19. There is moderate to extensive parenchymal involvement. 3. Borderline cardiomegaly. Coronary artery calcifications. Aortic Atherosclerosis (ICD10-I70.0). Electronically Signed   By: Keith Rake M.D.   On:  08/07/2019 03:11   DG CHEST PORT 1 VIEW  Result Date: 08/27/2019 CLINICAL DATA:  Worsening respiratory status, COVID-19, diabetes mellitus, hypertension EXAM: PORTABLE CHEST 1 VIEW COMPARISON:  Portable exam 1151 hours compared to 08/21/2019 FINDINGS: RIGHT jugular Port-A-Cath with tip projecting over cavoatrial junction. Upper normal heart size. Mediastinal contours and pulmonary vascularity normal. Scattered BILATERAL pulmonary infiltrates RIGHT greater than LEFT consistent with multifocal pneumonia, slightly improved in LEFT upper lobe. No pleural effusion or pneumothorax. IMPRESSION: BILATERAL pulmonary infiltrates consistent with multifocal pneumonia, slightly improved in LEFT upper lobe. Electronically Signed   By: Lavonia Dana M.D.   On: 08/27/2019 11:58   DG CHEST PORT 1 VIEW  Result Date: 08/21/2019 CLINICAL DATA:  Shortness of breath EXAM: PORTABLE CHEST 1 VIEW COMPARISON:  August 18, 2019 FINDINGS: The heart size and mediastinal contours are unchanged. Aortic knob calcifications. A right-sided MediPort catheter with the tip at the superior cavoatrial junction. Multifocal patchy/ground-glass opacities are seen throughout both lungs. There is slight interval improvement in the opacities within the left lung. no acute osseous abnormality. IMPRESSION: Diffuse multifocal bilateral airspace opacities with slight interval improvement in the left lung. Electronically Signed   By: Prudencio Pair M.D.   On: 08/21/2019 03:02   DG CHEST PORT 1 VIEW  Result Date: 08/18/2019 CLINICAL DATA:  Cough and fever.  COVID-19 positive EXAM: PORTABLE CHEST 1 VIEW COMPARISON:  08/13/2019 FINDINGS: Bilateral airspace disease compatible with pneumonia. Mild improvement in infiltrate in the right lung and mild progression on the left. No effusion or pneumothorax. Port-A-Cath tip at the cavoatrial junction unchanged. IMPRESSION: Diffuse bilateral airspace disease compatible with pneumonia. Mild improvement on the right and  mild progression  on the left. Electronically Signed   By: Franchot Gallo M.D.   On: 08/18/2019 08:09   DG CHEST PORT 1 VIEW  Result Date: 08/13/2019 CLINICAL DATA:  Pneumonia, COVID EXAM: PORTABLE CHEST 1 VIEW COMPARISON:  08/06/2019 FINDINGS: Right Port-A-Cath remains in place, unchanged. Diffuse right lung airspace disease and patchy left mid and lower lung airspace opacities, new since prior study. Heart is normal size. Low lung volumes. No effusions or acute bony abnormality. IMPRESSION: Diffuse right lung airspace disease and patchy left lung opacities concerning for pneumonia. Electronically Signed   By: Rolm Baptise M.D.   On: 08/13/2019 10:27   DG Chest Portable 1 View  Result Date: 08/06/2019 CLINICAL DATA:  Shortness of breath, pancreatic cancer EXAM: PORTABLE CHEST 1 VIEW COMPARISON:  08/31/2017 FINDINGS: There is a right-sided Port-A-Cath with the tip projecting over the SVC. There is no focal consolidation. There is no pleural effusion or pneumothorax. The heart and mediastinal contours are unremarkable. There is no acute osseous abnormality. IMPRESSION: No active disease. Electronically Signed   By: Kathreen Devoid   On: 08/06/2019 22:01   VAS [place] LOWER EXTREMITY VENOUS (DVT)  Result Date: 08/22/2019  Lower Venous Study Indications: Elevated d-dimer. COVID positive.  Comparison Study: No prior study. Performing Technologist: Maudry Mayhew MHA, RDMS, RVT, RDCS  Examination Guidelines: A complete evaluation includes B-mode imaging, spectral Doppler, color Doppler, and power Doppler as needed of all accessible portions of each vessel. Bilateral testing is considered an integral part of a complete examination. Limited examinations for reoccurring indications may be performed as noted.  +---------+---------------+---------+-----------+----------+--------------+ RIGHT    CompressibilityPhasicitySpontaneityPropertiesThrombus Aging  +---------+---------------+---------+-----------+----------+--------------+ CFV      Full           Yes      Yes                                 +---------+---------------+---------+-----------+----------+--------------+ SFJ      Full                                                        +---------+---------------+---------+-----------+----------+--------------+ FV Prox  Full                                                        +---------+---------------+---------+-----------+----------+--------------+ FV Mid   Full                                                        +---------+---------------+---------+-----------+----------+--------------+ FV DistalFull                                                        +---------+---------------+---------+-----------+----------+--------------+ PFV      Full                                                        +---------+---------------+---------+-----------+----------+--------------+  POP      Full           Yes      Yes                                 +---------+---------------+---------+-----------+----------+--------------+ PTV      Full                                                        +---------+---------------+---------+-----------+----------+--------------+ PERO     Full                                                        +---------+---------------+---------+-----------+----------+--------------+   +---------+---------------+---------+-----------+----------+--------------+ LEFT     CompressibilityPhasicitySpontaneityPropertiesThrombus Aging +---------+---------------+---------+-----------+----------+--------------+ CFV      Full           Yes      Yes                                 +---------+---------------+---------+-----------+----------+--------------+ SFJ      Full                                                         +---------+---------------+---------+-----------+----------+--------------+ FV Prox  Full                                                        +---------+---------------+---------+-----------+----------+--------------+ FV Mid   Full                                                        +---------+---------------+---------+-----------+----------+--------------+ FV DistalFull                                                        +---------+---------------+---------+-----------+----------+--------------+ PFV      Full                                                        +---------+---------------+---------+-----------+----------+--------------+ POP      Full           Yes      Yes                                 +---------+---------------+---------+-----------+----------+--------------+  PTV      Full                                                        +---------+---------------+---------+-----------+----------+--------------+ PERO     Full                                                        +---------+---------------+---------+-----------+----------+--------------+     Summary: Right: There is no evidence of deep vein thrombosis in the lower extremity. No cystic structure found in the popliteal fossa. Left: There is no evidence of deep vein thrombosis in the lower extremity. No cystic structure found in the popliteal fossa.  *See table(s) above for measurements and observations. Electronically signed by Monica Martinez MD on 08/22/2019 at 3:35:04 PM.    Final        Subjective: Patient seen and examined at bedside.  He denies any current pain.  Feels that his breathing is okay.  Wants to go home today with hospice.  Does not have much appetite.  Discharge Exam: Vitals:   09/03/19 0315 09/03/19 0802  BP:  110/86  Pulse:  (!) 103  Resp:  19  Temp: 98.3 F (36.8 C) 97.7 F (36.5 C)  SpO2:  95%    General: Pt is alert, awake, not in acute  distress.  Looks chronically ill Cardiovascular: Intermittently tachycardic, S1/S2 + Respiratory: bilateral decreased breath sounds at bases with scattered crackles Abdominal: Soft, NT, ND, bowel sounds + Extremities: Trace lower extremity edema, no cyanosis    The results of significant diagnostics from this hospitalization (including imaging, microbiology, ancillary and laboratory) are listed below for reference.     Microbiology: Recent Results (from the past 240 hour(s))  Respiratory Panel by RT PCR (Flu A&B, Covid) - Nasopharyngeal Swab     Status: Abnormal   Collection Time: 09/01/19 12:41 PM   Specimen: Nasopharyngeal Swab  Result Value Ref Range Status   SARS Coronavirus 2 by RT PCR POSITIVE (A) NEGATIVE Final    Comment: RESULT CALLED TO, READ BACK BY AND VERIFIED WITH: Waneta Martins RN 14:45 09/01/19 ([email or web address]) (NOTE) SARS-CoV-2 target nucleic acids are DETECTED. SARS-CoV-2 RNA is generally detectable in upper respiratory specimens  during the acute phase of infection. Positive results are indicative of the presence of the identified virus, but do not rule out bacterial infection or co-infection with other pathogens not detected by the test. Clinical correlation with patient history and other diagnostic information is necessary to determine patient infection status. The expected result is Negative. Fact Sheet for Patients:  PinkCheek.be Fact Sheet for Healthcare Providers: GravelBags.it This test is not yet approved or cleared by the [place] FDA and  has been authorized for detection and/or diagnosis of SARS-CoV-2 by FDA under an Emergency Use Authorization (EUA).  This EUA will remain in effect (meaning this test can be used)  for the duration of  the COVID-19 declaration under Section 564(b)(1) of the Act, 21 U.S.C. section 360bbb-3(b)(1), unless the authorization is terminated or revoked sooner.     Influenza A by PCR NEGATIVE NEGATIVE Final   Influenza B by PCR NEGATIVE NEGATIVE Final    Comment: (  NOTE) The Xpert Xpress SARS-CoV-2/FLU/RSV assay is intended as an aid in  the diagnosis of influenza from Nasopharyngeal swab specimens and  should not be used as a sole basis for treatment. Nasal washings and  aspirates are unacceptable for Xpert Xpress SARS-CoV-2/FLU/RSV  testing. Fact Sheet for Patients: PinkCheek.be Fact Sheet for Healthcare Providers: GravelBags.it This test is not yet approved or cleared by the [place] FDA and  has been authorized for detection and/or diagnosis of SARS-CoV-2 by  FDA under an Emergency Use Authorization (EUA). This EUA will remain  in effect (meaning this test can be used) for the duration of the  Covid-19 declaration under Section 564(b)(1) of the Act, 21  U.S.C. section 360bbb-3(b)(1), unless the authorization is  terminated or revoked. Performed at [facility], [place] [address], [place],  [address]      Labs: BNP (last 3 results) Recent Labs    08/12/19 1721  BNP [id number]   Basic Metabolic Panel: Recent Labs  Lab 08/28/19 0430 08/29/19 0400 08/30/19 0355 08/31/19 0152 09/01/19 0500  NA 138 138 139 140 141  K 4.4 4.6 4.3 4.4 4.4  CL 98 99 99 99 99  CO2 31 32 31 32 31  GLUCOSE 107* 178* 123* 152* 175*  BUN 33* 33* 30* 31* 31*  CREATININE 0.83 0.81 0.75 0.69 0.78  CALCIUM 8.3* 8.4* 8.8* 8.7* 8.5*  MG 2.3 2.1 2.2 2.3 2.3  PHOS 3.3 3.8 3.3 2.9 4.1   Liver Function Tests: Recent Labs  Lab 08/28/19 0430 08/29/19 0400 08/30/19 0355 08/31/19 0152 09/01/19 0500  AST 23 25 24 25 22   ALT 45* 46* 40 39 37  ALKPHOS 83 82 83 85 79  BILITOT 1.1 0.7 1.2 1.1 0.7  PROT 6.5 6.0* 6.5 6.3* 5.8*  ALBUMIN 2.7* 2.7* 3.0* 2.9* 2.7*   No results for input(s): LIPASE, AMYLASE in the last 168 hours. No results for input(s): AMMONIA in the last 168 hours. CBC: Recent  Labs  Lab 08/27/19 1230 08/27/19 1230 08/28/19 0430 08/29/19 0400 08/30/19 0355 08/31/19 0152 09/01/19 0500  WBC 15.5*   < > 15.6* 13.0* 12.5* 18.5* 14.8*  NEUTROABS 14.3*  --  13.7* 11.8* 11.2* 17.6*  --   HGB 13.1   < > 12.5* 12.1* 12.1* 12.6* 12.2*  HCT 40.2   < > 38.6* 37.5* 38.7* 39.5 38.6*  MCV 100.0   < > 101.8* 101.6* 101.3* 100.3* 101.6*  PLT 202   < > 197 177 178 200 156   < > = values in this interval not displayed.   Cardiac Enzymes: No results for input(s): CKTOTAL, CKMB, CKMBINDEX, TROPONINI in the last 168 hours. BNP: Invalid input(s): POCBNP CBG: Recent Labs  Lab 09/02/19 0813 09/02/19 1128 09/02/19 1629 09/02/19 2056 09/03/19 0802  GLUCAP 129* 148* 74 88 99   D-Dimer Recent Labs    09/01/19 0500  DDIMER 2.60*   Hgb A1c No results for input(s): HGBA1C in the last 72 hours. Lipid Profile No results for input(s): CHOL, HDL, LDLCALC, TRIG, CHOLHDL, LDLDIRECT in the last 72 hours. Thyroid function studies No results for input(s): TSH, T4TOTAL, T3FREE, THYROIDAB in the last 72 hours.  Invalid input(s): FREET3 Anemia work up No results for input(s): VITAMINB12, FOLATE, FERRITIN, TIBC, IRON, RETICCTPCT in the last 72 hours. Urinalysis    Component Value Date/Time   COLORURINE YELLOW 08/04/2019 1119   APPEARANCEUR CLEAR 08/04/2019 1119   LABSPEC 1.021 08/04/2019 1119   PHURINE 5.0 08/04/2019 1119   GLUCOSEU NEGATIVE  08/04/2019 [place] 08/04/2019 1119   BILIRUBINUR NEGATIVE 08/04/2019 1119   KETONESUR 5 (A) 08/04/2019 1119   PROTEINUR 30 (A) 08/04/2019 1119   NITRITE NEGATIVE 08/04/2019 1119   LEUKOCYTESUR NEGATIVE 08/04/2019 1119   Sepsis Labs Invalid input(s): PROCALCITONIN,  WBC,  LACTICIDVEN Microbiology Recent Results (from the past 240 hour(s))  Respiratory Panel by RT PCR (Flu A&B, Covid) - Nasopharyngeal Swab     Status: Abnormal   Collection Time: 09/01/19 12:41 PM   Specimen: Nasopharyngeal Swab  Result Value Ref  Range Status   SARS Coronavirus 2 by RT PCR POSITIVE (A) NEGATIVE Final    Comment: RESULT CALLED TO, READ BACK BY AND VERIFIED WITH: Waneta Martins RN 14:45 09/01/19 ([email or web address]) (NOTE) SARS-CoV-2 target nucleic acids are DETECTED. SARS-CoV-2 RNA is generally detectable in upper respiratory specimens  during the acute phase of infection. Positive results are indicative of the presence of the identified virus, but do not rule out bacterial infection or co-infection with other pathogens not detected by the test. Clinical correlation with patient history and other diagnostic information is necessary to determine patient infection status. The expected result is Negative. Fact Sheet for Patients:  PinkCheek.be Fact Sheet for Healthcare Providers: GravelBags.it This test is not yet approved or cleared by the [place] FDA and  has been authorized for detection and/or diagnosis of SARS-CoV-2 by FDA under an Emergency Use Authorization (EUA).  This EUA will remain in effect (meaning this test can be used)  for the duration of  the COVID-19 declaration under Section 564(b)(1) of the Act, 21 U.S.C. section 360bbb-3(b)(1), unless the authorization is terminated or revoked sooner.    Influenza A by PCR NEGATIVE NEGATIVE Final   Influenza B by PCR NEGATIVE NEGATIVE Final    Comment: (NOTE) The Xpert Xpress SARS-CoV-2/FLU/RSV assay is intended as an aid in  the diagnosis of influenza from Nasopharyngeal swab specimens and  should not be used as a sole basis for treatment. Nasal washings and  aspirates are unacceptable for Xpert Xpress SARS-CoV-2/FLU/RSV  testing. Fact Sheet for Patients: PinkCheek.be Fact Sheet for Healthcare Providers: GravelBags.it This test is not yet approved or cleared by the [place] FDA and  has been authorized for detection and/or diagnosis of SARS-CoV-2  by  FDA under an Emergency Use Authorization (EUA). This EUA will remain  in effect (meaning this test can be used) for the duration of the  Covid-19 declaration under Section 564(b)(1) of the Act, 21  U.S.C. section 360bbb-3(b)(1), unless the authorization is  terminated or revoked. Performed at [facility], [place] [address], [place], [place] [address]      Time coordinating discharge: 35 minutes  SIGNED:   Aline August, MD  [facility] 09/03/2019, 9:19 AM

## 2019-09-03 NOTE — Progress Notes (Signed)
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  Pt wife to Engineer, building services when it is set up so transport can be arranged.    [facility] will send RN to home around 5 pm.  Confirmed that comfort rx have been sent to pharmacy.  Updated wife on this and to pick up prior to husband coming home.  Updated TOC manager.  Will updated again once confirmation of DME is in place.  Venia Carbon RN, BSN, [facility] Liaison (in [place]) [phone]

## 2019-09-03 NOTE — Progress Notes (Signed)
Patient discharge home via [place].  All belongings sent home with patient.

## 2019-09-03 NOTE — TOC Transition Note (Signed)
Transition of Care [facility]) - CM/SW Discharge Note   Patient Details  Name: Vincent [name] MRN: [id number] Date of Birth: 12-22-1950  [facility] CM/SW Contact:  Pollie Friar, RN Phone Number: 09/03/2019, 1:58 PM   Clinical Narrative:    Pt discharging home with [facility]. Anderson Malta with [facility] has confirmed that DME including oxygen has been delivered to the home.  [facility]AR has been arranged and they are aware of the extra oxygen need and feel they can meet the needs of the patient. D/c packet in the chart and bedside RN updated.    Final next level of care: Home w Hospice Care Barriers to Discharge: No Barriers Identified   Patient Goals and CMS Choice   CMS Medicare.gov Compare Post Acute Care list provided to:: Patient Represenative (must comment) Choice offered to / list presented to : Spouse  Discharge Placement                       Discharge Plan and Services   Discharge Planning Services: CM Consult Post Acute Care Choice: Hospice                      [facility] Agency: [facility] [place] Date [place]: 09/02/19   Representative spoke with at [place]: [place] ([place]) Interventions     Readmission Risk Interventions No flowsheet data found.

## 2019-09-08 DEATH — deceased

## 2019-11-12 ENCOUNTER — Encounter: Payer: Self-pay | Admitting: Hematology

## 2020-08-11 IMAGING — US IR LEFT FLUORO GUIDE CV LINE
1 series · 1 of 1 positions shown · non-contrast
Comparison: none

CLINICAL DATA: Pancreatic carcinoma and need for porta cath prior
to beginning chemotherapy.

[Series 1: ir (id) (id)/(id)/(id) ir · 1 of 1 slices shown]
[im 1/1]
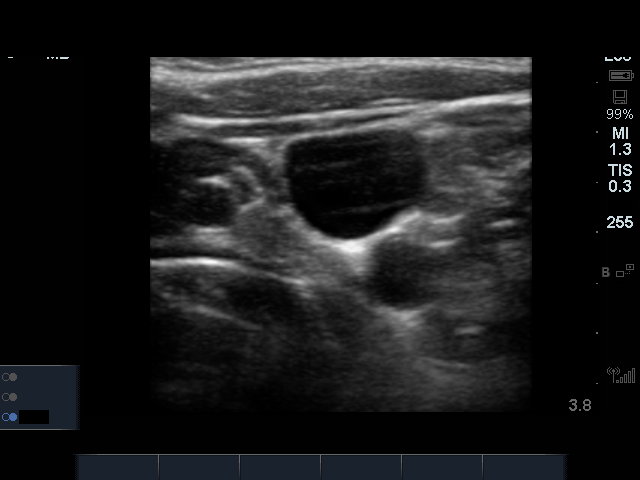

[1 of 1 positions shown; findings below may reference images not displayed]

EXAM:
IMPLANTED PORT A CATH PLACEMENT WITH ULTRASOUND AND FLUOROSCOPIC
GUIDANCE

ANESTHESIA/SEDATION:
2.0 mg IV Versed; 100 mcg IV Fentanyl

Total Moderate Sedation Time:  38 minutes

The patient's level of consciousness and physiologic status were
continuously monitored during the procedure by Radiology nursing.

Additional Medications: 2 g IV Ancef.

FLUOROSCOPY TIME:  1 minutes and 54 seconds.  40.1 mGy.

PROCEDURE:
The procedure, risks, benefits, and alternatives were explained to
the patient. Questions regarding the procedure were encouraged and
answered. The patient understands and consents to the procedure. A
time-out was performed prior to initiating the procedure.

Ultrasound was utilized to confirm patency of the right internal
jugular vein. The right neck and chest were prepped with
chlorhexidine in a sterile fashion, and a sterile drape was applied
covering the operative field. Maximum barrier sterile technique with
sterile gowns and gloves were used for the procedure. Local
anesthesia was provided with 1% lidocaine.

After creating a small venotomy incision, a 21 gauge needle was
advanced into the right internal jugular vein under direct,
real-time ultrasound guidance. Ultrasound image documentation was
performed. After securing guidewire access, an 8 Fr dilator was
placed. A J-wire was kinked to measure appropriate catheter length.

A subcutaneous port pocket was then created along the upper chest
wall utilizing sharp and blunt dissection. Portable cautery was
utilized. The pocket was irrigated with sterile saline.

A single lumen power injectable port was chosen for placement. The 8
Fr catheter was tunneled from the port pocket site to the venotomy
incision. The port was placed in the pocket. External catheter was
trimmed to appropriate length based on guidewire measurement.

At the venotomy, an 8 Fr peel-away sheath was placed over a
guidewire. The catheter was then placed through the sheath and the
sheath removed. Final catheter positioning was confirmed and
documented with a fluoroscopic spot image. The port was accessed
with a needle and aspirated and flushed with heparinized saline. The
access needle was removed.

The venotomy and port pocket incisions were closed with subcutaneous
3-0 Monocryl and subcuticular 4-0 Vicryl. Dermabond was applied to
both incisions.

COMPLICATIONS:
COMPLICATIONS
None
FINDINGS: After catheter placement, the tip lies at the Nicoll junction.
The catheter aspirates normally and is ready for immediate use.
IMPRESSION: Placement of single lumen port a cath via right internal jugular
vein. The catheter tip lies at the Nicoll junction. A power
injectable port a cath was placed and is ready for immediate use.

## 2020-11-11 IMAGING — CT CT ABDOMEN AND PELVIS WITH CONTRAST
3 of 9 series · 15 of 46 positions shown, 17 images · IV contrast (OMNIPAQUE)
Comparison: Chest CT 11/27/2018 and outside MRI 10/16/2018

CLINICAL DATA: Restaging pancreatic cancer. Initial diagnosis October 2018

EXAM:
CT ABDOMEN AND PELVIS WITH CONTRAST
TECHNIQUE: Multidetector CT imaging of the abdomen and pelvis was performed
using the standard protocol following bolus administration of
intravenous contrast.
CONTRAST:  100mL OMNIPAQUE IOHEXOL 300 MG/ML  SOLN

[Series 3: coronal arterial · coronal · arterial · 0.74mm/px · 3 of 85 slices shown]
[im 22/85  soft-tissue]
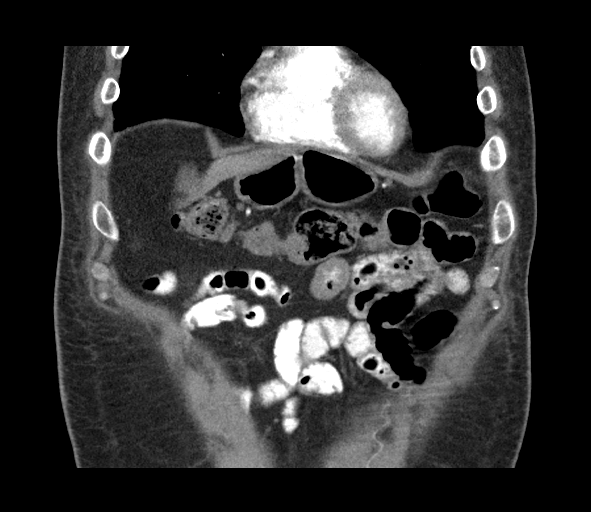
[im 43/85  soft-tissue]
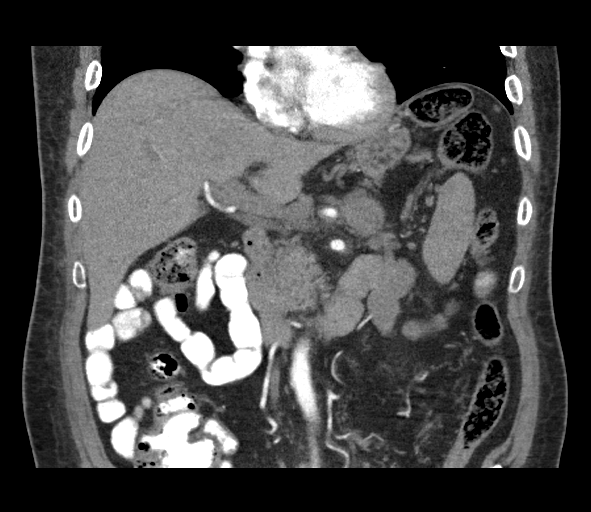
[im 64/85  soft-tissue]
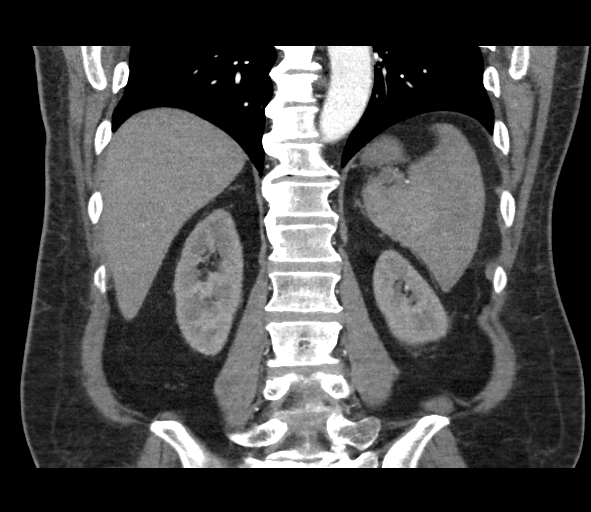

[Series 7: axial venous · axial · portal-venous · 0.97mm/px · z∈[+1111,+1522]mm · 9 of 173 slices shown, 11 images]
[im 18/173  soft-tissue]
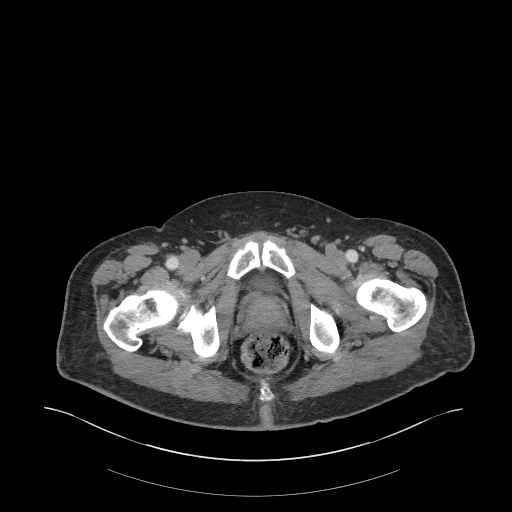
[im 18/173  bone]
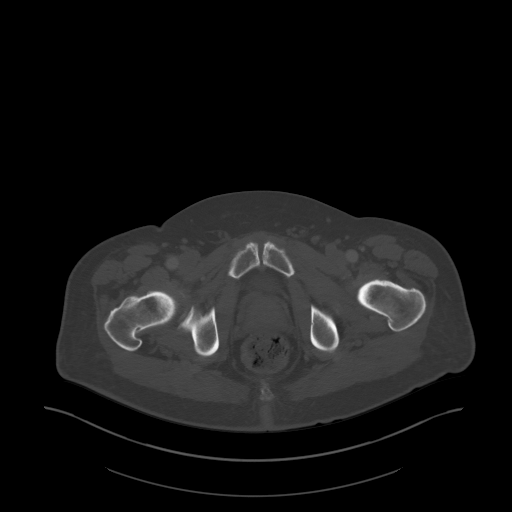
[im 35/173  soft-tissue]
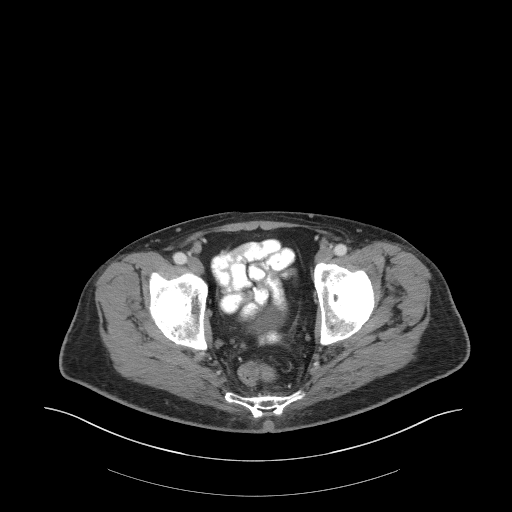
[im 52/173  soft-tissue]
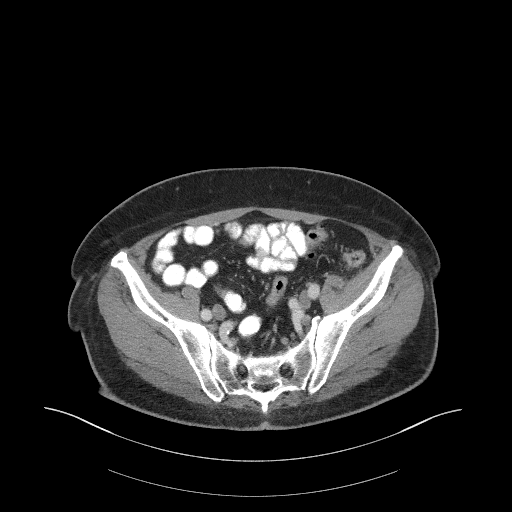
[im 69/173  soft-tissue]
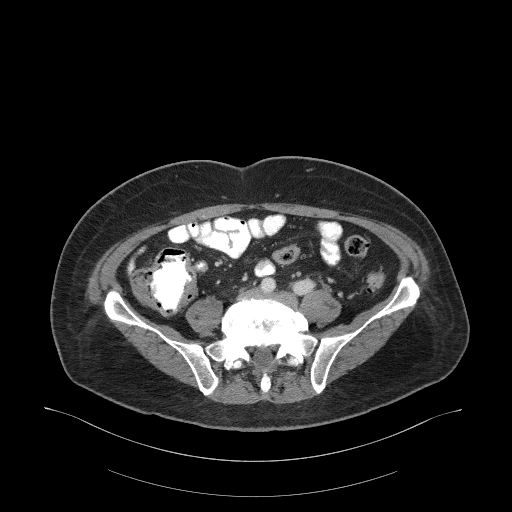
[im 87/173  soft-tissue]
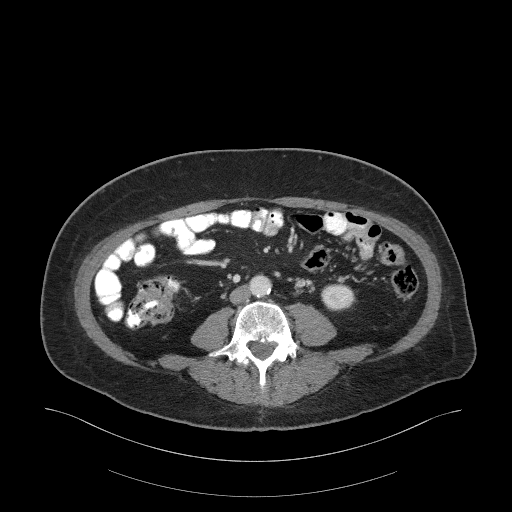
[im 104/173  soft-tissue]
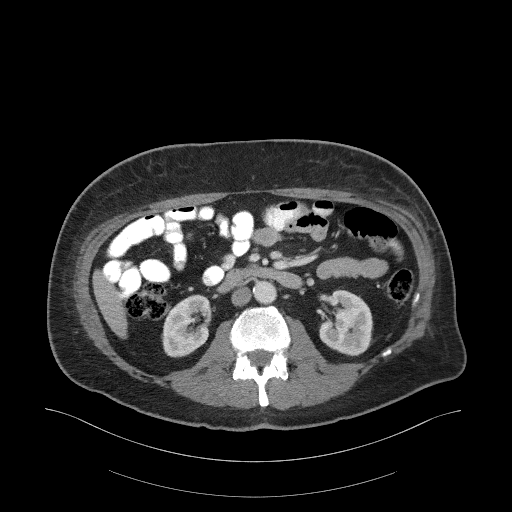
[im 121/173  soft-tissue]
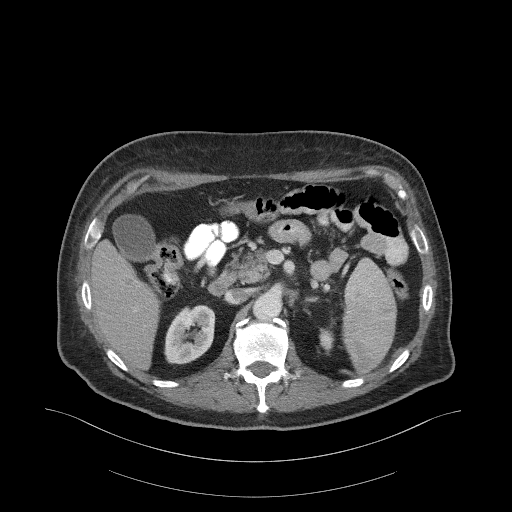
[im 138/173  soft-tissue]
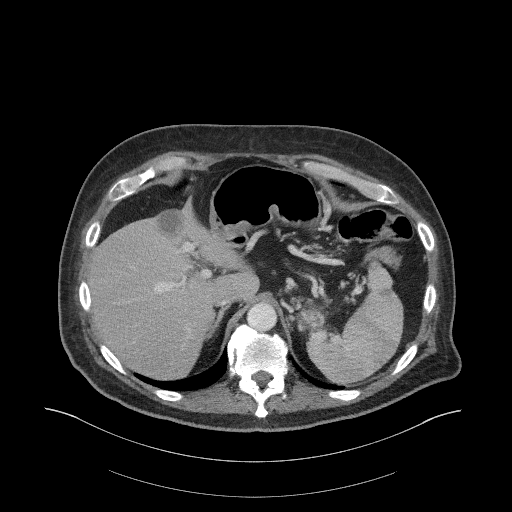
[im 155/173  soft-tissue]
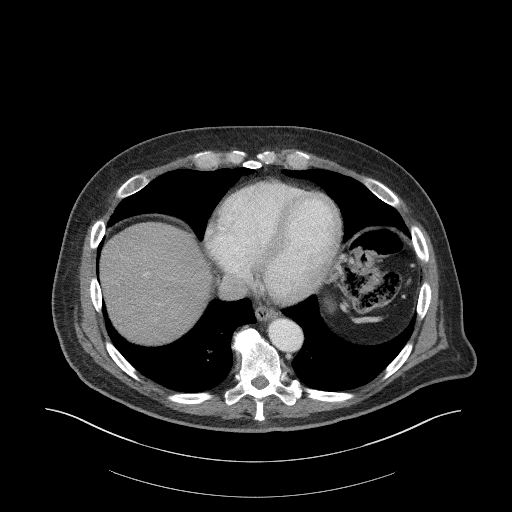
[im 155/173  bone]
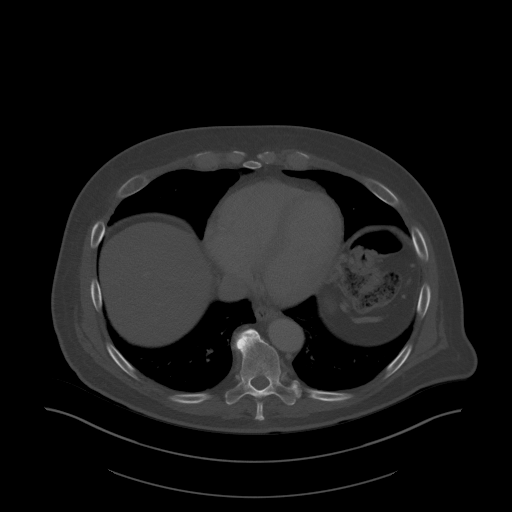

[Series 12: 2 lung portal venous · axial · portal-venous · 0.97mm/px · z∈[+1428,+1502]mm · 3 of 93 slices shown]
[im 19/93  bone]
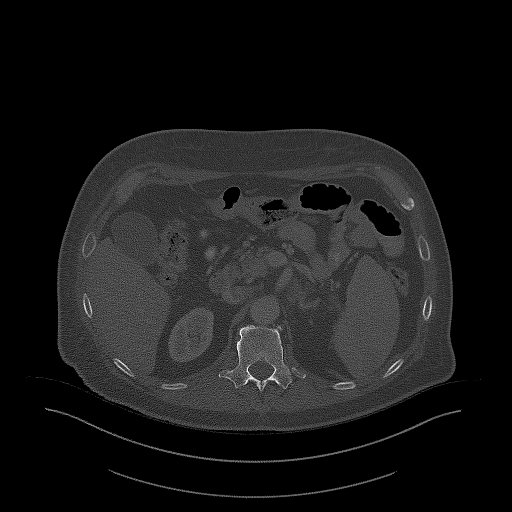
[im 37/93  bone]
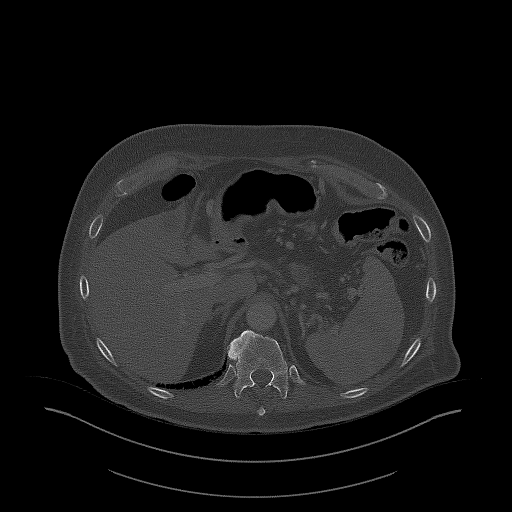
[im 56/93  bone]
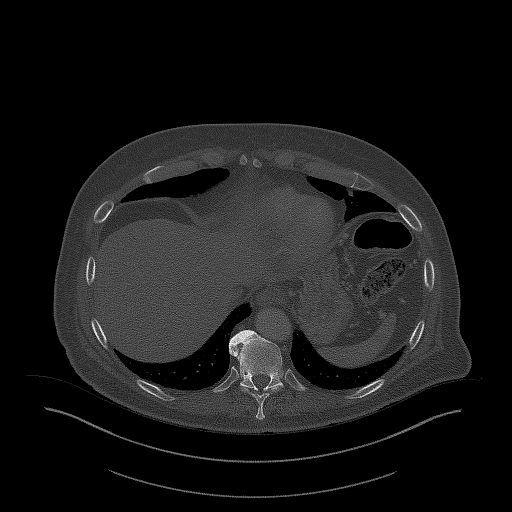

[15 of 46 positions shown; findings below may reference images not displayed]

FINDINGS: Lower chest: The lung bases are clear of an acute process. No
worrisome pulmonary nodules. No pleural effusion. The heart is
normal in size. Coronary artery calcifications are noted.

Hepatobiliary: 3.2 cm lesion in segment 4A measured 2 cm on the
prior MRI and 2.9 cm on the prior CT scan.

12 mm lesion in segment 6 on image number 62 measured 9 mm on the
prior MRI and 15 mm on the prior CT scan.

No new hepatic lesions. No intrahepatic biliary dilatation. The
gallbladder is normal. No common bile duct dilatation.

Pancreas: Ill-defined mass in the tail the pancreas extending into
the splenic hilum measures approximately 5.5 x 2.5 cm and appears
overall stable. There is tumor surrounding the splenic artery and
the splenic vein is occluded. Associated perfusion abnormality noted
in the spleen.

Spleen: Perfusion abnormality but no splenic lesions. Mild
splenomegaly.

Adrenals/Urinary Tract: The adrenal glands are unremarkable. No
renal lesions or hydronephrosis. Bladder is unremarkable.

Stomach/Bowel: The stomach, duodenum, small bowel and colon are
unremarkable. No acute inflammatory changes, mass lesions or
obstructive findings. Scattered colonic diverticulosis without
findings for acute diverticulitis. The terminal ileum and appendix
are normal.

Vascular/Lymphatic: The aorta and branch vessels are patent. Other
than the splenic vein the major venous structures are patent. Small
peripancreatic lymph nodes are stable. There are perigastric and
perisplenic collateral vessels due to the splenic vein occlusion.
The portal vein is patent.

Reproductive: The prostate gland and seminal vesicles are
unremarkable.

Other: No pelvic mass or pelvic adenopathy. No free pelvic fluid
collections. No inguinal mass or adenopathy.

Musculoskeletal: No significant bony findings. There are
degenerative changes involving the spine, hips and SI joints.
IMPRESSION: 1. Stable appearing ill-defined pancreatic mass in the tail region
extending into the splenic hilum. The splenic vein is occluded and
there are perisplenic and perigastric collateral vessels.
2. The left hepatic lobe metastatic lesion is slightly larger and
the right hepatic lobe lesion is slightly smaller. No new lesions
are identified.
3. Small peripancreatic lymph nodes are stable. No retroperitoneal
lymphadenopathy.
4. No worrisome pulmonary nodules at the lung bases and no worrisome
bone lesions.

## 2021-04-02 NOTE — Telephone Encounter (Signed)
This encounter was created in error - please disregard.

## 2023-06-27 NOTE — Telephone Encounter (Signed)
Telephone call
